# Patient Record
Sex: Male | Born: 1935
Health system: Southern US, Community
[De-identification: ages and names within clinical notes are randomized; demographics above are authoritative.]

## PROBLEM LIST (undated history)

## (undated) DIAGNOSIS — M1990 Unspecified osteoarthritis, unspecified site: Secondary | ICD-10-CM

## (undated) DIAGNOSIS — L738 Other specified follicular disorders: Secondary | ICD-10-CM

## (undated) DIAGNOSIS — N259 Disorder resulting from impaired renal tubular function, unspecified: Secondary | ICD-10-CM

## (undated) DIAGNOSIS — G4700 Insomnia, unspecified: Secondary | ICD-10-CM

## (undated) DIAGNOSIS — I498 Other specified cardiac arrhythmias: Secondary | ICD-10-CM

## (undated) DIAGNOSIS — H9190 Unspecified hearing loss, unspecified ear: Secondary | ICD-10-CM

## (undated) DIAGNOSIS — K4020 Bilateral inguinal hernia, without obstruction or gangrene, not specified as recurrent: Secondary | ICD-10-CM

## (undated) DIAGNOSIS — M1710 Unilateral primary osteoarthritis, unspecified knee: Secondary | ICD-10-CM

## (undated) DIAGNOSIS — J309 Allergic rhinitis, unspecified: Secondary | ICD-10-CM

## (undated) DIAGNOSIS — I10 Essential (primary) hypertension: Secondary | ICD-10-CM

## (undated) DIAGNOSIS — R609 Edema, unspecified: Secondary | ICD-10-CM

## (undated) DIAGNOSIS — R54 Age-related physical debility: Secondary | ICD-10-CM

## (undated) DIAGNOSIS — Z9289 Personal history of other medical treatment: Secondary | ICD-10-CM

## (undated) DIAGNOSIS — N401 Enlarged prostate with lower urinary tract symptoms: Secondary | ICD-10-CM

## (undated) DIAGNOSIS — J449 Chronic obstructive pulmonary disease, unspecified: Secondary | ICD-10-CM

## (undated) DIAGNOSIS — I4891 Unspecified atrial fibrillation: Secondary | ICD-10-CM

## (undated) DIAGNOSIS — N138 Other obstructive and reflux uropathy: Secondary | ICD-10-CM

## (undated) DIAGNOSIS — M179 Osteoarthritis of knee, unspecified: Secondary | ICD-10-CM

## (undated) HISTORY — PX: APPENDECTOMY: SHX54

## (undated) HISTORY — PX: EYE SURGERY: SHX253

## (undated) HISTORY — DX: Unilateral primary osteoarthritis, unspecified knee: M17.10

## (undated) HISTORY — DX: Benign prostatic hyperplasia with lower urinary tract symptoms: N40.1

## (undated) HISTORY — DX: Unspecified atrial fibrillation: I48.91

## (undated) HISTORY — DX: Essential (primary) hypertension: I10

## (undated) HISTORY — PX: KNEE SURGERY: SHX244

## (undated) HISTORY — DX: Other specified cardiac arrhythmias: I49.8

## (undated) HISTORY — DX: Personal history of other medical treatment: Z92.89

## (undated) HISTORY — PX: OTHER SURGICAL HISTORY: SHX169

## (undated) HISTORY — DX: Edema, unspecified: R60.9

## (undated) HISTORY — DX: Age-related physical debility: R54

## (undated) HISTORY — DX: Disorder resulting from impaired renal tubular function, unspecified: N25.9

## (undated) HISTORY — DX: Insomnia, unspecified: G47.00

## (undated) HISTORY — DX: Unspecified hearing loss, unspecified ear: H91.90

## (undated) HISTORY — DX: Chronic obstructive pulmonary disease, unspecified: J44.9

## (undated) HISTORY — DX: Allergic rhinitis, unspecified: J30.9

## (undated) HISTORY — DX: Other specified follicular disorders: L73.8

## (undated) HISTORY — DX: Other obstructive and reflux uropathy: N13.8

## (undated) HISTORY — DX: Bilateral inguinal hernia, without obstruction or gangrene, not specified as recurrent: K40.20

## (undated) HISTORY — DX: Osteoarthritis of knee, unspecified: M17.9

## (undated) HISTORY — PX: TONSILLECTOMY: SHX5217

---

## 2007-10-14 ENCOUNTER — Encounter: Payer: Self-pay | Admitting: Internal Medicine

## 2007-10-26 ENCOUNTER — Ambulatory Visit: Payer: Self-pay | Admitting: Internal Medicine

## 2007-10-26 ENCOUNTER — Telehealth (INDEPENDENT_AMBULATORY_CARE_PROVIDER_SITE_OTHER): Payer: Self-pay | Admitting: *Deleted

## 2007-10-26 LAB — CONVERTED CEMR LAB
INR: 2.4 — ABNORMAL HIGH (ref 0.8–1.0)
Prothrombin Time: 25.9 s — ABNORMAL HIGH (ref 10.9–13.3)

## 2007-10-27 ENCOUNTER — Telehealth: Payer: Self-pay | Admitting: Internal Medicine

## 2007-10-31 ENCOUNTER — Telehealth (INDEPENDENT_AMBULATORY_CARE_PROVIDER_SITE_OTHER): Payer: Self-pay | Admitting: *Deleted

## 2007-11-01 ENCOUNTER — Telehealth: Payer: Self-pay | Admitting: Internal Medicine

## 2007-11-04 ENCOUNTER — Ambulatory Visit: Payer: Self-pay | Admitting: Internal Medicine

## 2007-11-07 ENCOUNTER — Ambulatory Visit: Payer: Self-pay | Admitting: Cardiology

## 2007-11-09 ENCOUNTER — Telehealth: Payer: Self-pay | Admitting: Internal Medicine

## 2007-11-10 ENCOUNTER — Ambulatory Visit: Payer: Self-pay

## 2007-11-10 ENCOUNTER — Encounter: Payer: Self-pay | Admitting: Internal Medicine

## 2007-11-10 ENCOUNTER — Ambulatory Visit: Payer: Self-pay | Admitting: Internal Medicine

## 2007-11-10 LAB — CONVERTED CEMR LAB: TSH: 0.87 microintl units/mL (ref 0.35–5.50)

## 2007-11-17 ENCOUNTER — Ambulatory Visit: Payer: Self-pay | Admitting: Cardiology

## 2007-11-21 ENCOUNTER — Ambulatory Visit: Payer: Self-pay | Admitting: Internal Medicine

## 2007-11-24 ENCOUNTER — Ambulatory Visit: Payer: Self-pay | Admitting: Internal Medicine

## 2007-12-05 ENCOUNTER — Ambulatory Visit: Payer: Self-pay | Admitting: Cardiology

## 2007-12-22 ENCOUNTER — Ambulatory Visit: Payer: Self-pay | Admitting: Internal Medicine

## 2008-01-02 ENCOUNTER — Encounter: Payer: Self-pay | Admitting: Internal Medicine

## 2008-01-02 ENCOUNTER — Ambulatory Visit: Payer: Self-pay | Admitting: Internal Medicine

## 2008-01-02 ENCOUNTER — Telehealth (INDEPENDENT_AMBULATORY_CARE_PROVIDER_SITE_OTHER): Payer: Self-pay | Admitting: *Deleted

## 2008-01-10 ENCOUNTER — Ambulatory Visit: Payer: Self-pay | Admitting: Cardiovascular Disease

## 2008-01-18 ENCOUNTER — Telehealth: Payer: Self-pay | Admitting: Internal Medicine

## 2008-01-18 ENCOUNTER — Ambulatory Visit: Payer: Self-pay | Admitting: Internal Medicine

## 2008-01-23 ENCOUNTER — Telehealth: Payer: Self-pay | Admitting: Internal Medicine

## 2008-01-31 ENCOUNTER — Ambulatory Visit: Payer: Self-pay | Admitting: Internal Medicine

## 2008-02-08 ENCOUNTER — Ambulatory Visit: Payer: Self-pay | Admitting: Cardiology

## 2008-02-21 ENCOUNTER — Ambulatory Visit: Payer: Self-pay | Admitting: Cardiology

## 2008-03-20 ENCOUNTER — Ambulatory Visit: Payer: Self-pay | Admitting: Cardiology

## 2008-03-29 ENCOUNTER — Ambulatory Visit: Payer: Self-pay | Admitting: Internal Medicine

## 2008-04-17 ENCOUNTER — Ambulatory Visit: Payer: Self-pay | Admitting: Cardiology

## 2008-04-18 ENCOUNTER — Encounter: Admission: AD | Admit: 2008-04-18 | Discharge: 2008-04-18 | Payer: Self-pay | Admitting: Dentistry

## 2008-04-18 ENCOUNTER — Ambulatory Visit: Payer: Self-pay | Admitting: Dentistry

## 2008-04-24 ENCOUNTER — Telehealth: Payer: Self-pay | Admitting: Internal Medicine

## 2008-04-30 ENCOUNTER — Ambulatory Visit: Payer: Self-pay | Admitting: Internal Medicine

## 2008-05-01 ENCOUNTER — Ambulatory Visit: Payer: Self-pay | Admitting: Dentistry

## 2008-05-01 ENCOUNTER — Ambulatory Visit (HOSPITAL_COMMUNITY): Admission: RE | Admit: 2008-05-01 | Discharge: 2008-05-01 | Payer: Self-pay | Admitting: Dentistry

## 2008-05-05 ENCOUNTER — Ambulatory Visit: Payer: Self-pay | Admitting: Internal Medicine

## 2008-05-15 ENCOUNTER — Ambulatory Visit: Payer: Self-pay | Admitting: Cardiovascular Disease

## 2008-05-29 ENCOUNTER — Ambulatory Visit: Payer: Self-pay | Admitting: Internal Medicine

## 2008-06-26 ENCOUNTER — Ambulatory Visit: Payer: Self-pay | Admitting: Cardiology

## 2008-07-05 ENCOUNTER — Ambulatory Visit: Payer: Self-pay | Admitting: Dentistry

## 2008-07-10 ENCOUNTER — Ambulatory Visit: Payer: Self-pay | Admitting: Internal Medicine

## 2008-07-23 DIAGNOSIS — I10 Essential (primary) hypertension: Secondary | ICD-10-CM

## 2008-07-26 ENCOUNTER — Ambulatory Visit: Payer: Self-pay | Admitting: Cardiology

## 2008-08-03 ENCOUNTER — Telehealth: Payer: Self-pay | Admitting: Internal Medicine

## 2008-08-07 ENCOUNTER — Ambulatory Visit: Payer: Self-pay | Admitting: Cardiology

## 2008-08-22 ENCOUNTER — Ambulatory Visit: Payer: Self-pay | Admitting: Internal Medicine

## 2008-09-04 ENCOUNTER — Ambulatory Visit: Payer: Self-pay | Admitting: Cardiology

## 2008-09-10 ENCOUNTER — Ambulatory Visit: Payer: Self-pay | Admitting: Dentistry

## 2008-10-02 ENCOUNTER — Ambulatory Visit: Payer: Self-pay | Admitting: Internal Medicine

## 2008-10-24 ENCOUNTER — Ambulatory Visit: Payer: Self-pay | Admitting: Cardiology

## 2008-11-13 ENCOUNTER — Ambulatory Visit: Payer: Self-pay | Admitting: Cardiology

## 2008-12-05 ENCOUNTER — Ambulatory Visit: Payer: Self-pay | Admitting: Cardiology

## 2008-12-10 ENCOUNTER — Emergency Department (HOSPITAL_COMMUNITY): Admission: EM | Admit: 2008-12-10 | Discharge: 2008-12-10 | Payer: Self-pay | Admitting: Emergency Medicine

## 2008-12-18 ENCOUNTER — Encounter: Payer: Self-pay | Admitting: *Deleted

## 2008-12-21 ENCOUNTER — Ambulatory Visit: Payer: Self-pay | Admitting: Cardiology

## 2008-12-21 LAB — CONVERTED CEMR LAB: POC INR: 1.7

## 2009-01-04 ENCOUNTER — Ambulatory Visit: Payer: Self-pay | Admitting: Internal Medicine

## 2009-01-04 LAB — CONVERTED CEMR LAB
POC INR: 2
Protime: 17.6

## 2009-01-23 ENCOUNTER — Encounter: Payer: Self-pay | Admitting: *Deleted

## 2009-01-24 ENCOUNTER — Encounter: Payer: Self-pay | Admitting: Internal Medicine

## 2009-01-25 ENCOUNTER — Encounter (INDEPENDENT_AMBULATORY_CARE_PROVIDER_SITE_OTHER): Payer: Self-pay | Admitting: Cardiology

## 2009-01-25 ENCOUNTER — Ambulatory Visit: Payer: Self-pay | Admitting: Cardiology

## 2009-01-25 LAB — CONVERTED CEMR LAB: Prothrombin Time: 19.9 s

## 2009-02-22 ENCOUNTER — Ambulatory Visit: Payer: Self-pay | Admitting: Cardiology

## 2009-02-27 ENCOUNTER — Encounter (INDEPENDENT_AMBULATORY_CARE_PROVIDER_SITE_OTHER): Payer: Self-pay | Admitting: *Deleted

## 2009-02-28 ENCOUNTER — Telehealth: Payer: Self-pay | Admitting: Cardiology

## 2009-03-01 ENCOUNTER — Ambulatory Visit: Payer: Self-pay | Admitting: Cardiology

## 2009-03-01 LAB — CONVERTED CEMR LAB
GFR calc non Af Amer: 43.94 mL/min (ref >60)
Glucose, Bld: 108 mg/dL — ABNORMAL HIGH (ref 70–99)

## 2009-03-21 ENCOUNTER — Ambulatory Visit: Payer: Self-pay | Admitting: Cardiology

## 2009-03-21 LAB — CONVERTED CEMR LAB: POC INR: 2.2

## 2009-03-27 ENCOUNTER — Ambulatory Visit: Payer: Self-pay | Admitting: Internal Medicine

## 2009-03-29 ENCOUNTER — Telehealth: Payer: Self-pay | Admitting: Internal Medicine

## 2009-04-01 ENCOUNTER — Ambulatory Visit: Payer: Self-pay | Admitting: Cardiology

## 2009-04-04 LAB — CONVERTED CEMR LAB
BUN: 24 mg/dL — ABNORMAL HIGH (ref 6–23)
Basophils Relative: 0.8 % (ref 0.0–3.0)
CO2: 26 meq/L (ref 19–32)
Chloride: 110 meq/L (ref 96–112)
Creatinine, Ser: 1.5 mg/dL (ref 0.4–1.5)
Eosinophils Relative: 2 % (ref 0.0–5.0)
Hemoglobin: 14.2 g/dL (ref 13.0–17.0)
Lymphocytes Relative: 26.1 % (ref 12.0–46.0)
Monocytes Relative: 8.3 % (ref 3.0–12.0)
Neutro Abs: 3.5 10*3/uL (ref 1.4–7.7)
Neutrophils Relative %: 62.8 % (ref 43.0–77.0)
Potassium: 4.4 meq/L (ref 3.5–5.1)
RBC: 4.63 M/uL (ref 4.22–5.81)
WBC: 5.6 10*3/uL (ref 4.5–10.5)

## 2009-04-11 ENCOUNTER — Telehealth: Payer: Self-pay | Admitting: Internal Medicine

## 2009-04-18 ENCOUNTER — Ambulatory Visit: Payer: Self-pay | Admitting: Internal Medicine

## 2009-04-24 ENCOUNTER — Telehealth: Payer: Self-pay | Admitting: Cardiology

## 2009-05-16 ENCOUNTER — Ambulatory Visit: Payer: Self-pay | Admitting: Cardiology

## 2009-06-12 ENCOUNTER — Ambulatory Visit: Payer: Self-pay | Admitting: Internal Medicine

## 2009-07-10 ENCOUNTER — Ambulatory Visit: Payer: Self-pay | Admitting: Internal Medicine

## 2009-07-10 LAB — CONVERTED CEMR LAB: POC INR: 2.8

## 2009-08-07 ENCOUNTER — Ambulatory Visit: Payer: Self-pay | Admitting: Cardiovascular Disease

## 2009-08-26 ENCOUNTER — Telehealth: Payer: Self-pay | Admitting: Internal Medicine

## 2009-08-28 ENCOUNTER — Encounter: Payer: Self-pay | Admitting: Internal Medicine

## 2009-08-30 ENCOUNTER — Ambulatory Visit: Payer: Self-pay | Admitting: Internal Medicine

## 2009-09-04 ENCOUNTER — Ambulatory Visit: Payer: Self-pay | Admitting: Internal Medicine

## 2009-09-10 ENCOUNTER — Encounter: Payer: Self-pay | Admitting: Internal Medicine

## 2009-10-02 ENCOUNTER — Ambulatory Visit: Payer: Self-pay | Admitting: Internal Medicine

## 2009-10-02 LAB — CONVERTED CEMR LAB: POC INR: 2.5

## 2009-10-28 ENCOUNTER — Telehealth: Payer: Self-pay | Admitting: Internal Medicine

## 2009-10-30 ENCOUNTER — Ambulatory Visit: Payer: Self-pay | Admitting: Internal Medicine

## 2009-11-27 ENCOUNTER — Ambulatory Visit: Payer: Self-pay | Admitting: Internal Medicine

## 2009-12-20 ENCOUNTER — Telehealth: Payer: Self-pay | Admitting: Nurse Practitioner

## 2009-12-25 ENCOUNTER — Ambulatory Visit: Payer: Self-pay | Admitting: Cardiology

## 2010-01-06 ENCOUNTER — Ambulatory Visit: Payer: Self-pay | Admitting: Cardiology

## 2010-01-07 ENCOUNTER — Ambulatory Visit: Payer: Self-pay | Admitting: Internal Medicine

## 2010-01-07 DIAGNOSIS — J309 Allergic rhinitis, unspecified: Secondary | ICD-10-CM

## 2010-01-07 HISTORY — DX: Allergic rhinitis, unspecified: J30.9

## 2010-01-08 LAB — CONVERTED CEMR LAB
Basophils Absolute: 0 10*3/uL (ref 0.0–0.1)
CO2: 29 meq/L (ref 19–32)
Calcium: 9.4 mg/dL (ref 8.4–10.5)
Eosinophils Absolute: 0.2 10*3/uL (ref 0.0–0.7)
Glucose, Bld: 111 mg/dL — ABNORMAL HIGH (ref 70–99)
HCT: 47.1 % (ref 39.0–52.0)
Hemoglobin: 15.8 g/dL (ref 13.0–17.0)
Lymphs Abs: 1.5 10*3/uL (ref 0.7–4.0)
MCHC: 33.6 g/dL (ref 30.0–36.0)
MCV: 90.4 fL (ref 78.0–100.0)
Monocytes Absolute: 0.5 10*3/uL (ref 0.1–1.0)
Neutro Abs: 3 10*3/uL (ref 1.4–7.7)
Potassium: 4.7 meq/L (ref 3.5–5.1)
RDW: 16.3 % — ABNORMAL HIGH (ref 11.5–14.6)
Sodium: 142 meq/L (ref 135–145)

## 2010-01-29 ENCOUNTER — Ambulatory Visit: Payer: Self-pay | Admitting: Internal Medicine

## 2010-02-26 ENCOUNTER — Ambulatory Visit: Payer: Self-pay | Admitting: Cardiology

## 2010-03-26 ENCOUNTER — Ambulatory Visit: Payer: Self-pay | Admitting: Cardiovascular Disease

## 2010-04-16 ENCOUNTER — Telehealth: Payer: Self-pay | Admitting: Internal Medicine

## 2010-04-17 ENCOUNTER — Ambulatory Visit: Payer: Self-pay | Admitting: Internal Medicine

## 2010-04-23 ENCOUNTER — Ambulatory Visit: Payer: Self-pay | Admitting: Cardiovascular Disease

## 2010-04-24 ENCOUNTER — Telehealth: Payer: Self-pay | Admitting: Internal Medicine

## 2010-05-21 ENCOUNTER — Ambulatory Visit: Payer: Self-pay | Admitting: Cardiology

## 2010-06-18 ENCOUNTER — Ambulatory Visit: Payer: Self-pay | Admitting: Internal Medicine

## 2010-06-30 ENCOUNTER — Encounter: Payer: Self-pay | Admitting: Cardiology

## 2010-06-30 ENCOUNTER — Ambulatory Visit: Payer: Self-pay | Admitting: Cardiology

## 2010-07-16 ENCOUNTER — Ambulatory Visit: Payer: Self-pay | Admitting: Cardiology

## 2010-08-13 ENCOUNTER — Ambulatory Visit: Admission: RE | Admit: 2010-08-13 | Discharge: 2010-08-13 | Payer: Self-pay | Source: Home / Self Care

## 2010-08-19 ENCOUNTER — Telehealth: Payer: Self-pay | Admitting: Cardiology

## 2010-08-19 NOTE — Assessment & Plan Note (Signed)
Summary: recertify for o2///jrc   Visit Type:  Follow-up Copy to:  Dr. Dereck Ligas Na Primary Provider/Referring Provider:  Norins  CC:  Pt last seen 4/09. Pt here fo roxygen recertification. Marland Kitchen  History of Present Illness: OV 08/30/2009. This is a 77 year old male. Last seen April 2009 when he moved from [place] to [place] to be with duaghter. Had admission in 12/27/05 in [place] for Acute resp failure due to aE-copd and pneumona per hx. After that has been new  2L o2 dependent. Was told he had COPD. At that time when I saw him in April 2009, he was at his new baseline of Class 2 dyspnea and using spiriva. We did PFTs at that time on 11/04/2007. This showed isolated low DLCO but was otherwise normal. CXR April 8. 2009 showed elevated rt hemidiaphragm but otherwise clear except for some atlectasis.  After that he was lost to followup. Now showing due to O2 recertification. States that in past 2 years, good health. Has baseline doe when he walks 3 football field lenghts only. USes o2 all the time. Not taking inhalers incluidng spiriva and advair.   Main new complaint is hoarseness of voice past 2-3 years. Thinks it is related to intubation in 27-Dec-2005. Insidious onset. Stable since onset. No clear cut aggravating or relieving factors. Associated with difficulty getting out sputum for past  3 months. No associated weight loss  Current Medications (verified): 1)  Atenolol 50 Mg  Tabs (Atenolol) .... Take 1 Tablet By Mouth Two Times A Day 2)  Cvs Omeprazole 20 Mg  Tbec (Omeprazole) .... Take One Capsule Once Daily 3)  Flomax 0.4 Mg  Cp24 (Tamsulosin Hcl) .... Take One Capsule By Mouth Once Daily 4)  Vitamin D3 1000 Unit  Tabs (Cholecalciferol) .Marland Kitchen.. 1 By Mouth Daily 5)  Warfarin Sodium 3 Mg Tabs (Warfarin Sodium) .... As Directed 6)  Temazepam 30 Mg Caps (Temazepam) .... As Needed 7)  Amlodipine Besylate 5 Mg Tabs (Amlodipine Besylate) .... Take One Tablet By Mouth Daily  Allergies (verified): No Known Drug  Allergies  Past History:  Family History: Last updated: 2007-12-19 Father -deceased @54 : heart disease/MI mother -deceased @97 : colon cancer Neg- prostate, DM Brother with fatal MI Sister with liver failure/cirrhosis  Social History: Last updated: 07/23/2008 [employer] - [employer] 4 yrs WW[employer], shrapnel injury married - 12/27/45  after 33 yrs divorced; remarried 1983-12-28 - 2 daughters - 2056/12/1055; 1 son '30 - died cyanide poisoning/suicide work: Engineer, petroleum - retired. Lives temporarily with daughter- moved up from [place], [place].    wife remains in [place] at this time - May '09 EOL: patient with daughter as witness clearly does not want CPR, Mechanical         ventilation or other heroic/futile measures. Remote history of tobacco abuse No ETOH  Risk Factors: Caffeine Use: 1 (2007-12-19) Exercise: no (12/19/07)  Risk Factors: Smoking Status: quit (10/26/2007)  Past Medical History: Reviewed history from 04/01/2009 and no changes required. COPD - O2 dependent since Dec 27, 2005 in [place], [place]. Never intubated. No PFTs till April 2009.   Atrial Fibrillation - 09/2007. Pending appt with Dr. Olga Millers April 2009 BPH -  Hx of Pneumonia 2005-12-27 and December 28, 2007 Urosepsis - 2009 March Hypertension - on atenelol since 2001/12/27 HOH  Hematuria - at hospital admit march 2009 in [place].  Insomnia Osteoarthritis  Past Surgical History: Reviewed history from 07/23/2008 and no changes required. Appendectomy @23  Tonsillectomy Prior back surgery  Family History: Reviewed history  from 11/21/2007 and no changes required. Father -deceased @54 : heart disease/MI mother -deceased @97 : colon cancer Neg- prostate, DM Brother with fatal MI Sister with liver failure/cirrhosis  Social History: Reviewed history from 07/23/2008 and no changes required. [employer] - [employer] 4 yrs WWII, shrapnel injury married - Dec 02, 2045  after 2031-12-03 yrs divorced; remarried Dec 03, 1983 - 2 daughters - 05/16/20565; 1 son '23 - died  cyanide poisoning/suicide work: Engineer, petroleum - retired. Lives temporarily with daughter- moved up from [place], [place].    wife remains in [place] at this time - May '09 EOL: patient with daughter as witness clearly does not want CPR, Mechanical         ventilation or other heroic/futile measures. Remote history of tobacco abuse No ETOH  Review of Systems       The patient complains of shortness of breath with activity.  The patient denies shortness of breath at rest, productive cough, non-productive cough, coughing up blood, chest pain, irregular heartbeats, acid heartburn, indigestion, loss of appetite, weight change, abdominal pain, difficulty swallowing, sore throat, tooth/dental problems, headaches, nasal congestion/difficulty breathing through nose, sneezing, itching, ear ache, anxiety, depression, hand/feet swelling, joint stiffness or pain, rash, change in color of mucus, and fever.    Vital Signs:  Patient profile:   77 year old male Height:      67 inches Weight:      189.13 pounds O2 Sat:      95 % on Room air Pulse rate:   55 / minute BP sitting:   122 / 80  (right arm) Cuff size:   regular  Vitals Entered By: Carron Curie CMA (August 30, 2009 2:08 PM)  O2 Flow:  Room air  Serial Vital Signs/Assessments:  Comments: Ambulatory Pulse Oximetry  Resting; HR__56___    02 Sat__92___  Lap1 (185 feet)   HR__101___   02 Sat__87___Pt placed on 4 liters oxygen and sat incrased to 95% Lap2 (185 feet)   HR_____   02 Sat_____    Lap3 (185 feet)   HR_____   02 Sat_____  ___Test Completed without Difficulty __x_Test Stopped due to:pt desaturated   By: Carron Curie CMA   CC: Pt last seen 4/09. Pt here fo roxygen recertification.  Comments Medications reviewed with patient Carron Curie CMA  August 30, 2009 2:08 PM Daytime phone number verified with patient.    Physical Exam  General:  well-developed well-nourished in no acute distress. Skin is warm and  dry. Head:  HEENT is normal. Eyes:  C&S clear Ears:  R cerumen impaction and L Cerumen impaction.   Nose:  Erythematous throat mucosa and intranasal erythema.  Mouth:  mild inflammation along lower molar areas visible stitch along lower right molars Neck:  supple with no bruits. No thyromegaly. Chest Wall:  NT Lungs:  clear to auscultation Heart:  regular rate and rhythm Abdomen:  soft and nontender. No masses palpated. Msk:  normal ROM, no joint tenderness, and no joint swelling.   Pulses:  R and L carotid,radial,femoral,dorsalis pedis and posterior tibial pulses are full and equal bilaterally Extremities:  no edema. Neurologic:  grossly intact. Skin:  Intact without suspicious lesions or rashes Cervical Nodes:  No lymphadenopathy noted Psych:  Cognition and judgment appear intact. Alert and cooperative with normal attention span and concentration. No apparent delusions, illusions, hallucinations   CXR  Procedure date:  08/22/2009  Findings:       Comparison: 10/26/2007    Findings: Stable elevation of the right hemidiaphragm.  Improved   atelectasis in the lingula.  Minimal bibasilar atelectasis   persists.  Heart is upper limits normal in size.  There is   tortuosity of the thoracic aorta.  No effusions.    IMPRESSION:   Improving lingular atelectasis.  Minimal persistent bibasilar   atelectasis.    Stable elevation of the right hemidiaphragm.   Comments:      independently reviewed and agree  Impression & Recommendations:  Problem # 1:  HOARSENESS (VWU-981.19) Assessment New This is probabl related to prior intubation but being a former smoker and wiht new complaints of sputum stuck in throat. Therefore, prudent to get ENT opinion. I have made a referral. He agrees Orders: ENT Referral (ENT) Est. Patient Level IV (14782)  Problem # 2:  C O P D (ICD-496) Assessment: Unchanged He was told in [place] in 2007 he had copd after hospitalizatiln. Certainly a  candidate for it due to smoking. However, PFTs here in [place] 2010 while on spiriva and advair were normal except for low DLCO. The low dlco could be related to some atelectasis related to right diaphragm elevation or COPD itself. Currently I am not sure. I have advised him to take albuterol 2 puff as needed. Will repat PFTs since he is off inhalers to dettermine if he truly has copd or not. Currently has class 2 dyspnea with walking desaturations. So, ok to use O2  Patient Instructions: 1)  Use oxygen with walking past 50 feet and at sleep 2)  USe albuterol 2 puff as needed when short of breath 3)  Have breathing test spirometry in 3 months 4)  Return to see me in 3 months 5)  Continue your other medicines 6)  I am sending you to ENT doctor for hoarse voice   Immunization History:  Influenza Immunization History:    Influenza:  fluvax 3+ (03/20/2009)

## 2010-08-19 NOTE — Progress Notes (Signed)
  Phone Note Refill Request Message from:  Fax from Pharmacy on October 28, 2009 8:43 AM  Refills Requested: Medication #1:  TEMAZEPAM 30 MG CAPS as needed   Last Refilled: 09/22/2009 Please advise refill  Initial call taken by: Ami Bullins CMA,  October 28, 2009 8:45 AM  Follow-up for Phone Call        ok for #30 with 5 refills Follow-up by: Jacques Navy MD,  October 29, 2009 5:23 AM    Prescriptions: TEMAZEPAM 30 MG CAPS (TEMAZEPAM) as needed  #30 x 5   Entered by:   Ami Bullins CMA   Authorized by:   Jacques Navy MD   Signed by:   Bill Salinas CMA on 10/29/2009   Method used:   Telephoned to ...       [facility]. [phone]* (retail)       [phone]       [place], [place]  [address]       Ph: [phone]       Fax: [phone]   RxID:   [id number]

## 2010-08-19 NOTE — Medication Information (Signed)
Summary: rov/eac   Anticoagulant Therapy  Managed by: Bethena Midget, RN, BSN Referring MD: Olga Millers MD PCP: Link Snuffer MD: Clifton James MD, Cristal Deer Indication 1: Atrial Fibrillation (ICD-427.31) Lab Used: [facility] Site: [facility] INR POC 3.1 INR RANGE 2 - 3  Dietary changes: no    Health status changes: no    Bleeding/hemorrhagic complications: no    Recent/future hospitalizations: no    Any changes in medication regimen? no    Recent/future dental: no  Any missed doses?: no       Is patient compliant with meds? yes      Comments: Received flu vaccine  Allergies: No Known Drug Allergies  Anticoagulation Management History:      The patient is taking warfarin and comes in today for a routine follow up visit.  Positive risk factors for bleeding include an age of 35 years or older and presence of serious comorbidities.  Negative risk factors for bleeding include no history of CVA/TIA.  The bleeding index is 'intermediate risk'.  Positive CHADS2 values include History of HTN and Age > 41 years old.  Negative CHADS2 values include History of Diabetes and Prior Stroke/CVA/TIA.  The start date was 11/04/2007.  His last INR was 2.4 RATIO.  Anticoagulation responsible provider: Clifton James MD, Cristal Deer.  INR POC: 3.1.  Cuvette Lot#: [id number].  Exp: 05/2011.    Anticoagulation Management Assessment/Plan:      The patient's current anticoagulation dose is Warfarin sodium 3 mg tabs: as directed.  The target INR is 2.0-3.0.  The next INR is due 05/21/2010.  Anticoagulation instructions were given to patient.  Results were reviewed/authorized by Bethena Midget, RN, BSN.  He was notified by Bethena Midget, RN, BSN.         Prior Anticoagulation Instructions: INR 2.9  Continue taking 1/2 tablet on Thursday and 1 tablet all other days.  Return to clinic in 4 weeks.      Current Anticoagulation Instructions: INR 3.1 Today only take 1/2 pill then resume 1 pill everyday  except 1/2 pill on Thursdays. Recheck in 4 weeks.

## 2010-08-19 NOTE — Medication Information (Signed)
Summary: rov/tm   Anticoagulant Therapy  Managed by: Reina Fuse, PharmD Referring MD: Olga Millers MD PCP: Link Snuffer MD: Gala Romney MD, Reuel Boom Indication 1: Atrial Fibrillation (ICD-427.31) Lab Used: [facility] Site: [facility] INR POC 3.3 INR RANGE 2 - 3  Dietary changes: yes       Details: Has eaten a little less greens than usual.   Health status changes: no    Bleeding/hemorrhagic complications: no    Recent/future hospitalizations: no    Any changes in medication regimen? no    Recent/future dental: no  Any missed doses?: no       Is patient compliant with meds? yes       Allergies: No Known Drug Allergies  Anticoagulation Management History:      The patient is taking warfarin and comes in today for a routine follow up visit.  Positive risk factors for bleeding include an age of 75 years or older and presence of serious comorbidities.  Negative risk factors for bleeding include no history of CVA/TIA.  The bleeding index is 'intermediate risk'.  Positive CHADS2 values include History of HTN and Age > 65 years old.  Negative CHADS2 values include History of Diabetes and Prior Stroke/CVA/TIA.  The start date was 11/04/2007.  His last INR was 2.4 RATIO.  Anticoagulation responsible provider: Bensimhon MD, Reuel Boom.  INR POC: 3.3.  Cuvette Lot#: [id number].  Exp: 05/2011.    Anticoagulation Management Assessment/Plan:      The patient's current anticoagulation dose is Warfarin sodium 3 mg tabs: as directed.  The target INR is 2.0-3.0.  The next INR is due 07/16/2010.  Anticoagulation instructions were given to patient.  Results were reviewed/authorized by Reina Fuse, PharmD.  He was notified by Reina Fuse PharmD.         Prior Anticoagulation Instructions: INR 2.7 Continue 3mg  daily except 1.5mg s on Thursdays. Recheck in 4 weeks.   Current Anticoagulation Instructions: INR 3.3  Today, Wednesday, November 30th, do not take Coumadin. Then, resume taking  Coumadin 1 tab (3 mg) on all days except for Coumadin 0.5 tab (1.5 m) on Thursdays.  Return to clinic in 4 weeks.

## 2010-08-19 NOTE — Progress Notes (Signed)
Summary: Cardiology Phone Note - Edema   Phone Note Call from Patient   Caller: Daughter Summary of Call: received call from pts dtr that pt has been experiencing lower ext. edema.  edema is not present first thing in the morning but comes on over the course of the day.  pt. is sedentary and spends much of his day with his legs in a dependent position.  he has been told to wear TEDS in the past but doesn't.  he has no sob, pnd, orthopnea, change in wt., or change in abd. girth. I advised that as he is asymptomatic he does not need evaluation tonight.  However, he should keep his legs elevated whenever he is not up and walking and he should also wear his TED hose as prescribed.  his dtr verbalized understanding and was grateful for the call back. Initial call taken by: Creig Hines, ANP-BC,  December 20, 2009 6:42 PM

## 2010-08-19 NOTE — Medication Information (Signed)
Summary: rov/tm  Anticoagulant Therapy  Managed by: Weston Brass, PharmD Referring MD: Olga Millers MD PCP: Link Snuffer MD: Johney Frame MD, Fayrene Fearing Indication 1: Atrial Fibrillation (ICD-427.31) Lab Used: [facility]  Site: [facility] INR POC 1.9 INR RANGE 2 - 3  Dietary changes: no    Health status changes: no    Bleeding/hemorrhagic complications: no    Recent/future hospitalizations: no    Any changes in medication regimen? no    Recent/future dental: no  Any missed doses?: no       Is patient compliant with meds? yes       Allergies: No Known Drug Allergies  Anticoagulation Management History:      The patient is taking warfarin and comes in today for a routine follow up visit.  Positive risk factors for bleeding include an age of 75 years or older and presence of serious comorbidities.  Negative risk factors for bleeding include no history of CVA/TIA.  The bleeding index is 'intermediate risk'.  Positive CHADS2 values include History of HTN and Age > 26 years old.  Negative CHADS2 values include History of Diabetes and Prior Stroke/CVA/TIA.  The start date was 11/04/2007.  His last INR was 2.4 RATIO.  Anticoagulation responsible provider: Edyth Glomb MD, Fayrene Fearing.  INR POC: 1.9.  Cuvette Lot#: [id number].  Exp: 11/2010.    Anticoagulation Management Assessment/Plan:      The patient's current anticoagulation dose is Warfarin sodium 3 mg tabs: as directed.  The target INR is 2.0-3.0.  The next INR is due 11/27/2009.  Anticoagulation instructions were given to patient.  Results were reviewed/authorized by Weston Brass, PharmD.  He was notified by Weston Brass PharmD.         Prior Anticoagulation Instructions: INR 2.5 Continue 3mg s daily except 1.5mg s on Thursdays. REcheck in 4 weeks.   Current Anticoagulation Instructions: INR 1.9  Take 1 1/2 tablets today then resume same dose of 1 tablet every day except 1/2 tablet on Thursday

## 2010-08-19 NOTE — Letter (Signed)
Summary: [facility]  [facility]   Imported By: Sherian Rein 10/24/2009 11:01:13  _____________________________________________________________________  External Attachment:    Type:   Image     Comment:   External Document

## 2010-08-19 NOTE — Progress Notes (Signed)
Summary: REFILL TEMAZEPAM  Phone Note Refill Request Message from:  Fax from Pharmacy  Refills Requested: Medication #1:  TEMAZEPAM 30 MG CAPS as needed   Notes: 1 qhs prn [facility]  Initial call taken by: Lamar Sprinkles, CMA,  April 24, 2010 6:14 PM  Follow-up for Phone Call        ok x 5 Follow-up by: Jacques Navy MD,  April 24, 2010 6:58 PM    Prescriptions: TEMAZEPAM 30 MG CAPS (TEMAZEPAM) as needed  #30 x 5   Entered by:   Ami Bullins CMA   Authorized by:   Jacques Navy MD   Signed by:   Bill Salinas CMA on 04/25/2010   Method used:   Telephoned to ...       [facility]. [phone]* (retail)       [phone]       [place], [place]  [address]       Ph: [phone]       Fax: [phone]   RxID:   [id number]

## 2010-08-19 NOTE — Letter (Signed)
Summary: [facility]  [facility]   Imported By: Lester Kleberg 09/20/2009 11:02:08  _____________________________________________________________________  External Attachment:    Type:   Image     Comment:   External Document

## 2010-08-19 NOTE — Progress Notes (Signed)
Summary: fu  Phone Note Outgoing Call   Summary of Call: Ricardo Perkins, I got o2 recertiifcation letter but have not see him since april 2009. pls give him appt Initial call taken by: Kalman Shan MD,  August 26, 2009 5:47 PM  Follow-up for Phone Call        pt scheduled for appt on 08/30/08 at 2:30.Carron Curie CMA  August 27, 2009 2:49 PM

## 2010-08-19 NOTE — Medication Information (Signed)
Summary: rov/ewj  Anticoagulant Therapy  Managed by: Ricardo Reams, RN, BSN Referring MD: Ricardo Millers MD PCP: Ricardo Snuffer MD: Ricardo Frame MD, Ricardo Perkins Indication 1: Atrial Fibrillation (ICD-427.31) Lab Used: [facility] Site: [facility] INR POC 2.6 INR RANGE 2 - 3  Dietary changes: yes       Details: Trying to eat more vetetables, d/t incr weight.    Health status changes: no    Bleeding/hemorrhagic complications: no    Recent/future hospitalizations: no    Any changes in medication regimen? no    Recent/future dental: no  Any missed doses?: no       Is patient compliant with meds? yes       Allergies (verified): No Known Drug Allergies  Anticoagulation Management History:      The patient is taking warfarin and comes in today for a routine follow up visit.  Positive risk factors for bleeding include an age of 29 years or older and presence of serious comorbidities.  Negative risk factors for bleeding include no history of CVA/TIA.  The bleeding index is 'intermediate risk'.  Positive CHADS2 values include History of HTN and Age > 33 years old.  Negative CHADS2 values include History of Diabetes and Prior Stroke/CVA/TIA.  The start date was 11/04/2007.  His last INR was 2.4 RATIO.  Anticoagulation responsible provider: Beyonca Wisz MD, Ricardo Perkins.  INR POC: 2.6.  Cuvette Lot#: [id number].  Exp: 10/2010.    Anticoagulation Management Assessment/Plan:      The patient's current anticoagulation dose is Warfarin sodium 3 mg tabs: as directed.  The target INR is 2.0-3.0.  The next INR is due 10/02/2009.  Anticoagulation instructions were given to patient.  Results were reviewed/authorized by Ricardo Reams, RN, BSN.  He was notified by Ricardo Reams RN.         Prior Anticoagulation Instructions: INR 2.3  Continue on same dosage 3mg  daily except 1.5mg  on Thursdays.  Recheck in 4 weeks.    Current Anticoagulation Instructions: INR 2.6  Continue on same dosage 1 tablet daily except 1/2  tablet on Thursdays.  Recheck in 4 weeks.

## 2010-08-19 NOTE — Medication Information (Signed)
Summary: rov/sp   Anticoagulant Therapy  Managed by: Weston Brass, PharmD Referring MD: Olga Millers MD PCP: Link Snuffer MD: [name] MD, Arlys John Indication 1: Atrial Fibrillation (ICD-427.31) Lab Used: [facility] Site: [facility] INR POC 2.4 INR RANGE 2 - 3  Dietary changes: yes       Details: Less green vegetables the past few weeks.  Health status changes: no    Bleeding/hemorrhagic complications: no    Recent/future hospitalizations: no    Any changes in medication regimen? no    Recent/future dental: no  Any missed doses?: no       Is patient compliant with meds? yes       Allergies: No Known Drug Allergies  Anticoagulation Management History:      The patient is taking warfarin and comes in today for a routine follow up visit.  Positive risk factors for bleeding include an age of 3 years or older and presence of serious comorbidities.  Negative risk factors for bleeding include no history of CVA/TIA.  The bleeding index is 'intermediate risk'.  Positive CHADS2 values include History of HTN and Age > 66 years old.  Negative CHADS2 values include History of Diabetes and Prior Stroke/CVA/TIA.  The start date was 11/04/2007.  His last INR was 2.4 RATIO.  Anticoagulation responsible Tamyah Cutbirth: [name] MD, Arlys John.  INR POC: 2.4.  Cuvette Lot#: [id number].  Exp: 04/2011.    Anticoagulation Management Assessment/Plan:      The patient's current anticoagulation dose is Warfarin sodium 3 mg tabs: as directed.  The target INR is 2.0-3.0.  The next INR is due 03/26/2010.  Anticoagulation instructions were given to patient.  Results were reviewed/authorized by Weston Brass, PharmD.  He was notified by Liana Gerold, PharmD Candidate.         Prior Anticoagulation Instructions: INR 2.0  Continue same dose of 1 tablet every day except 1/2 tablet on Thursday.   Current Anticoagulation Instructions: INR 2.4  Continue taking 1 tablet daily except 1/2 tablet on Thursdays.   Return to clinic in 4 weeks.

## 2010-08-19 NOTE — Medication Information (Signed)
Summary: rov/sp  Anticoagulant Therapy  Managed by: Eda Keys, PharmD Referring MD: Olga Millers MD PCP: Link Snuffer MD: Clifton James MD, Cristal Deer Indication 1: Atrial Fibrillation (ICD-427.31) Lab Used: [facility] Site: [facility] INR POC 2.9 INR RANGE 2 - 3  Dietary changes: yes       Details: less greens than normal...but not a significant change  Health status changes: no    Bleeding/hemorrhagic complications: no    Recent/future hospitalizations: no    Any changes in medication regimen? no    Recent/future dental: no  Any missed doses?: no       Is patient compliant with meds? yes       Allergies: No Known Drug Allergies  Anticoagulation Management History:      The patient is taking warfarin and comes in today for a routine follow up visit.  Positive risk factors for bleeding include an age of 75 years or older and presence of serious comorbidities.  Negative risk factors for bleeding include no history of CVA/TIA.  The bleeding index is 'intermediate risk'.  Positive CHADS2 values include History of HTN and Age > 53 years old.  Negative CHADS2 values include History of Diabetes and Prior Stroke/CVA/TIA.  The start date was 11/04/2007.  His last INR was 2.4 RATIO.  Anticoagulation responsible provider: Clifton James MD, Cristal Deer.  INR POC: 2.9.  Cuvette Lot#: [id number].  Exp: 04/2011.    Anticoagulation Management Assessment/Plan:      The patient's current anticoagulation dose is Warfarin sodium 3 mg tabs: as directed.  The target INR is 2.0-3.0.  The next INR is due 04/23/2010.  Anticoagulation instructions were given to patient.  Results were reviewed/authorized by Eda Keys, PharmD.  He was notified by Eda Keys.         Prior Anticoagulation Instructions: INR 2.4  Continue taking 1 tablet daily except 1/2 tablet on Thursdays.  Return to clinic in 4 weeks.  Current Anticoagulation Instructions: INR 2.9  Continue taking 1/2 tablet on  Thursday and 1 tablet all other days.  Return to clinic in 4 weeks.

## 2010-08-19 NOTE — Progress Notes (Signed)
Summary: PNEUMONIA VACCINE  Phone Note Call from Patient   Summary of Call: Patient is scheduled for pneumonia vaccine tomorrow. Per EMR patient recieved this vaccine on 10/26/2007 and then again on 03/29/2008. Does this patient need vaccine once every year or more frequent? Or every 5 years? please advise.  Initial call taken by: Lamar Sprinkles, CMA,  April 16, 2010 10:03 AM  Follow-up for Phone Call        Only needed it once and having had it twice he will never need it again.  Follow-up by: Jacques Navy MD,  April 16, 2010 12:54 PM  Additional Follow-up for Phone Call Additional follow up Details #1::        Pt informed Additional Follow-up by: Lamar Sprinkles, CMA,  April 16, 2010 4:18 PM

## 2010-08-19 NOTE — Medication Information (Signed)
Summary: rov/sp  Anticoagulant Therapy  Managed by: Weston Brass, PharmD Referring MD: Olga Millers MD PCP: Link Snuffer MD: Juanda Chance MD, Jodine Muchmore Indication 1: Atrial Fibrillation (ICD-427.31) Lab Used: [facility] Site: [facility] INR POC 2.1 INR RANGE 2 - 3  Dietary changes: no    Health status changes: no    Bleeding/hemorrhagic complications: no    Recent/future hospitalizations: no    Any changes in medication regimen? no    Recent/future dental: no  Any missed doses?: no       Is patient compliant with meds? yes       Allergies: No Known Drug Allergies  Anticoagulation Management History:      The patient is taking warfarin and comes in today for a routine follow up visit.  Positive risk factors for bleeding include an age of 75 years or older and presence of serious comorbidities.  Negative risk factors for bleeding include no history of CVA/TIA.  The bleeding index is 'intermediate risk'.  Positive CHADS2 values include History of HTN and Age > 75 years old.  Negative CHADS2 values include History of Diabetes and Prior Stroke/CVA/TIA.  The start date was 11/04/2007.  His last INR was 2.4 RATIO.  Anticoagulation responsible provider: Juanda Chance MD, Smitty Cords.  INR POC: 2.1.  Cuvette Lot#: [id number].  Exp: 02/2011.    Anticoagulation Management Assessment/Plan:      The patient's current anticoagulation dose is Warfarin sodium 3 mg tabs: as directed.  The target INR is 2.0-3.0.  The next INR is due 01/22/2010.  Anticoagulation instructions were given to patient.  Results were reviewed/authorized by Weston Brass, PharmD.  He was notified by Weston Brass PharmD.         Prior Anticoagulation Instructions: INR 2.5  Continue same dose of 1 tablet every day except 1/2 tablet on Thursday   Current Anticoagulation Instructions: INR 2.1  Continue same dose of 1 tablet every day except 1/2 tablet on Thursday.

## 2010-08-19 NOTE — Medication Information (Signed)
Summary: rov/sp  Anticoagulant Therapy  Managed by: Weston Brass, PharmD Referring MD: Olga Millers MD PCP: Link Snuffer MD: Johney Frame MD, Fayrene Fearing Indication 1: Atrial Fibrillation (ICD-427.31) Lab Used: [facility] Site: [facility] INR RANGE 2 - 3      Any changes in medication regimen? yes       Details: decreased atenolol to 1/2 tab bid       Allergies: No Known Drug Allergies  Anticoagulation Management History:      The patient is taking warfarin and comes in today for a routine follow up visit.  Positive risk factors for bleeding include an age of 75 years or older and presence of serious comorbidities.  Negative risk factors for bleeding include no history of CVA/TIA.  The bleeding index is 'intermediate risk'.  Positive CHADS2 values include History of HTN and Age > 50 years old.  Negative CHADS2 values include History of Diabetes and Prior Stroke/CVA/TIA.  The start date was 11/04/2007.  His last INR was 2.4 RATIO.  Anticoagulation responsible provider: Eddye Broxterman MD, Fayrene Fearing.  Cuvette Lot#: [id number].  Exp: 03/2011.    Anticoagulation Management Assessment/Plan:      The patient's current anticoagulation dose is Warfarin sodium 3 mg tabs: as directed.  The target INR is 2.0-3.0.  The next INR is due 02/26/2010.  Anticoagulation instructions were given to patient.  Results were reviewed/authorized by Weston Brass, PharmD.  He was notified by Weston Brass PharmD.         Prior Anticoagulation Instructions: INR 2.1  Continue same dose of 1 tablet every day except 1/2 tablet on Thursday.   Current Anticoagulation Instructions: INR 2.0  Continue same dose of 1 tablet every day except 1/2 tablet on Thursday.

## 2010-08-19 NOTE — Assessment & Plan Note (Signed)
Summary: F11M/DM  Medications Added ATENOLOL 50 MG  TABS (ATENOLOL) Take 1/2  tablet by mouth two times a day        Referring Provider:  Dr. Dereck Ligas Na Primary Provider:  [facility]  CC:  check up.  History of Present Illness: [name] is a pleasant  gentleman, who has a history of paroxysmal atrial fibrillation.  His LV function is normal.  He has also had a previous Myoview on November 10, 2007, that was interpreted as a possible  small area of lateral ischemia at the apex, but I reviewed this, and  in fact, felt it was most likely normal.  His TSH has been normal. I last saw him in Sept 2010. Since then he has dyspnea with more extreme activities but not with routine activities. It is relieved with rest. It is not associated with chest pain. There is no orthopnea, PND, palpitations, syncope, chest pain or bleeding. He occasionally has mild pedal edema.   Current Medications (verified): 1)  Atenolol 50 Mg  Tabs (Atenolol) .... Take 1 Tablet By Mouth Two Times A Day 2)  Cvs Omeprazole 20 Mg  Tbec (Omeprazole) .... Take One Capsule Once Daily 3)  Flomax 0.4 Mg  Cp24 (Tamsulosin Hcl) .... Take One Capsule By Mouth Once Daily 4)  Vitamin D3 1000 Unit  Tabs (Cholecalciferol) .Marland Kitchen.. 1 By Mouth Daily 5)  Warfarin Sodium 3 Mg Tabs (Warfarin Sodium) .... As Directed 6)  Temazepam 30 Mg Caps (Temazepam) .... As Needed 7)  Amlodipine Besylate 5 Mg Tabs (Amlodipine Besylate) .... Take One Tablet By Mouth Daily  Allergies: No Known Drug Allergies  Past History:  Past Medical History: Reviewed history from 04/01/2009 and no changes required. COPD - O2 dependent since 12/20/2005 in [place], [place]. Never intubated. No PFTs till April 2009.   Atrial Fibrillation - 09/2007. Pending appt with Dr. Olga Millers April 2009 BPH -  Hx of Pneumonia 2005-12-20 and 12-21-2007 Urosepsis - 2009 March Hypertension - on atenelol since 12-20-01 HOH  Hematuria - at hospital admit march 2009 in [place].  Insomnia Osteoarthritis  Past  Surgical History: Reviewed history from 07/23/2008 and no changes required. Appendectomy @23  Tonsillectomy Prior back surgery  Social History: Reviewed history from 07/23/2008 and no changes required. 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 Lives temporarily with daughter- moved up from [place], [place].    wife remains in [place] at this time - May '09 EOL: patient with daughter as witness clearly does not want CPR, Mechanical         ventilation or other heroic/futile measures. Remote history of tobacco abuse No ETOH  Review of Systems       Mild arthralgias in his shoulders but no fevers or chills, productive cough, hemoptysis, dysphasia, odynophagia, melena, hematochezia, dysuria, hematuria, rash, seizure activity, orthopnea, PND, pedal edema, claudication. Remaining systems are negative.   Vital Signs:  Patient profile:   77 year old male Height:      67 inches Weight:      183 pounds BMI:     28.77 Pulse rate:   49 / minute Resp:     12 per minute BP sitting:   134 / 70  (left arm)  Vitals Entered By: Kem Parkinson (January 06, 2010 9:32 AM)  Physical Exam  General:  Well-developed  well-nourished in no acute distress.  Skin is warm and dry.  HEENT is normal.  Neck is supple. No thyromegaly.  Chest is clear to auscultation with normal expansion.  Cardiovascular exam is bradycardic.  Abdominal exam nontender or distended. No masses palpated. Extremities show no edema. neuro grossly intact    EKG  Procedure date:  01/06/2010  Findings:      Marked sinus bradycardia with first degree block. Prior anterior infarct. Prior inferior infarct. Left ventricular hypertrophy.  Impression & Recommendations:  Problem # 1:  RENAL INSUFFICIENCY (ICD-588.9)  Recheck to bmet.  Orders: TLB-BMP (Basic  Metabolic Panel-BMET) (80048-METABOL)  Problem # 2:  BRADYCARDIA (ICD-427.89) Decrease atenolol to 25 mg p.o. b.i.d. His updated medication list for this problem includes:    Atenolol 50 Mg Tabs (Atenolol) .Marland Kitchen... Take 1/2  tablet by mouth two times a day    Warfarin Sodium 3 Mg Tabs (Warfarin sodium) .Marland Kitchen... As directed    Amlodipine Besylate 5 Mg Tabs (Amlodipine besylate) .Marland Kitchen... Take one tablet by mouth daily  Problem # 3:  COUMADIN THERAPY (ICD-V58.61) Goal INR  2-3. Check CBC.  Problem # 4:  HYPERTENSION (ICD-401.9)  Blood pressure controlled. His updated medication list for this problem includes:    Atenolol 50 Mg Tabs (Atenolol) .Marland Kitchen... Take 1/2  tablet by mouth two times a day    Amlodipine Besylate 5 Mg Tabs (Amlodipine besylate) .Marland Kitchen... Take one tablet by mouth daily  His updated medication list for this problem includes:    Atenolol 50 Mg Tabs (Atenolol) .Marland Kitchen... Take 1 tablet by mouth two times a day    Amlodipine Besylate 5 Mg Tabs (Amlodipine besylate) .Marland Kitchen... Take one tablet by mouth daily  Problem # 5:  PAROXYSMAL ATRIAL FIBRILLATION (ICD-427.31)  Remains in sinus. Continue beta blocker and Coumadin. His updated medication list for this problem includes:    Atenolol 50 Mg Tabs (Atenolol) .Marland Kitchen... Take 1 tablet by mouth two times a day    Warfarin Sodium 3 Mg Tabs (Warfarin sodium) .Marland Kitchen... As directed  Orders: TLB-CBC Platelet - w/Differential (85025-CBCD)  Problem # 6:  C O P D (ICD-496)  Patient Instructions: 1)  Your physician recommends that you schedule a follow-up appointment in: 6 MONTHS 2)  Your physician has recommended you make the following change in your medication: CHANGE ATENOLOL TO 50MG  ONE HALF TABLET TWICE DAILY Prescriptions: ATENOLOL 50 MG  TABS (ATENOLOL) Take 1/2  tablet by mouth two times a day  #30 x 12   Entered by:   Deliah Goody, RN   Authorized by:   Ferman Hamming, MD, [facility])   Signed by:   Deliah Goody, RN on 01/06/2010   Method used:    Electronically to        [facility]. [phone]* (retail)       [phone]       [place], [place]  [address]       Ph: [phone]       Fax: [phone]   RxID:   [id number]

## 2010-08-19 NOTE — Medication Information (Signed)
Summary: rov/tm  Anticoagulant Therapy  Managed by: Cloyde Reams, RN, BSN Referring MD: Olga Millers MD PCP: Link Snuffer MD: Eden Emms MD, Theron Arista Indication 1: Atrial Fibrillation (ICD-427.31) Lab Used: [facility] Site: [facility] INR POC 2.3 INR RANGE 2 - 3  Dietary changes: no    Health status changes: no    Bleeding/hemorrhagic complications: no    Recent/future hospitalizations: no    Any changes in medication regimen? no    Recent/future dental: no  Any missed doses?: no       Is patient compliant with meds? yes       Allergies (verified): No Known Drug Allergies  Anticoagulation Management History:      The patient is taking warfarin and comes in today for a routine follow up visit.  Positive risk factors for bleeding include an age of 33 years or older and presence of serious comorbidities.  Negative risk factors for bleeding include no history of CVA/TIA.  The bleeding index is 'intermediate risk'.  Positive CHADS2 values include History of HTN and Age > 43 years old.  Negative CHADS2 values include History of Diabetes and Prior Stroke/CVA/TIA.  The start date was 11/04/2007.  His last INR was 2.4 RATIO.  Anticoagulation responsible provider: Eden Emms MD, Theron Arista.  INR POC: 2.3.  Cuvette Lot#: [id number].  Exp: 10/2010.    Anticoagulation Management Assessment/Plan:      The patient's current anticoagulation dose is Warfarin sodium 3 mg tabs: as directed.  The target INR is 2.0-3.0.  The next INR is due 09/04/2009.  Anticoagulation instructions were given to patient.  Results were reviewed/authorized by Cloyde Reams, RN, BSN.  He was notified by Cloyde Reams RN.         Prior Anticoagulation Instructions: INR 2.8 Continue 3mg s everyday except 1.5mg s on Thursdays. Recheck in 4 weeks.   Current Anticoagulation Instructions: INR 2.3  Continue on same dosage 3mg  daily except 1.5mg  on Thursdays.  Recheck in 4 weeks.

## 2010-08-19 NOTE — Medication Information (Signed)
Summary: rov/ewj  Anticoagulant Therapy  Managed by: Bethena Midget, RN, BSN Referring MD: Olga Millers MD PCP: Link Snuffer MD: Johney Frame MD, Fayrene Fearing Indication 1: Atrial Fibrillation (ICD-427.31) Lab Used: [facility] Site: [facility] INR POC 2.5 INR RANGE 2 - 3  Dietary changes: no    Health status changes: no    Bleeding/hemorrhagic complications: no    Recent/future hospitalizations: no    Any changes in medication regimen? no    Recent/future dental: no  Any missed doses?: no       Is patient compliant with meds? yes       Allergies: No Known Drug Allergies  Anticoagulation Management History:      The patient is taking warfarin and comes in today for a routine follow up visit.  Positive risk factors for bleeding include an age of 75 years or older and presence of serious comorbidities.  Negative risk factors for bleeding include no history of CVA/TIA.  The bleeding index is 'intermediate risk'.  Positive CHADS2 values include History of HTN and Age > 10 years old.  Negative CHADS2 values include History of Diabetes and Prior Stroke/CVA/TIA.  The start date was 11/04/2007.  His last INR was 2.4 RATIO.  Anticoagulation responsible provider: Kensington Duerst MD, Fayrene Fearing.  INR POC: 2.5.  Cuvette Lot#: [id number].  Exp: 11/2010.    Anticoagulation Management Assessment/Plan:      The patient's current anticoagulation dose is Warfarin sodium 3 mg tabs: as directed.  The target INR is 2.0-3.0.  The next INR is due 10/30/2009.  Anticoagulation instructions were given to patient.  Results were reviewed/authorized by Bethena Midget, RN, BSN.  He was notified by Bethena Midget, RN, BSN.         Prior Anticoagulation Instructions: INR 2.6  Continue on same dosage 1 tablet daily except 1/2 tablet on Thursdays.  Recheck in 4 weeks.    Current Anticoagulation Instructions: INR 2.5 Continue 3mg s daily except 1.5mg s on Thursdays. REcheck in 4 weeks.

## 2010-08-19 NOTE — Letter (Signed)
Summary: CMN for Oxygen/[facility]  CMN for Oxygen/[facility]   Imported By: Sherian Rein 09/02/2009 08:55:22  _____________________________________________________________________  External Attachment:    Type:   Image     Comment:   External Document

## 2010-08-19 NOTE — Medication Information (Signed)
Summary: rov/sp  Anticoagulant Therapy  Managed by: Weston Brass, PharmD Referring MD: Olga Millers MD PCP: Link Snuffer MD: Ladona Ridgel MD, Sharlot Gowda Indication 1: Atrial Fibrillation (ICD-427.31) Lab Used: [facility] Site: [facility] INR POC 2.5 INR RANGE 2 - 3  Dietary changes: no    Health status changes: no    Bleeding/hemorrhagic complications: no    Recent/future hospitalizations: no    Any changes in medication regimen? no    Recent/future dental: no  Any missed doses?: no       Is patient compliant with meds? yes       Allergies: No Known Drug Allergies  Anticoagulation Management History:      The patient is taking warfarin and comes in today for a routine follow up visit.  Positive risk factors for bleeding include an age of 75 years or older and presence of serious comorbidities.  Negative risk factors for bleeding include no history of CVA/TIA.  The bleeding index is 'intermediate risk'.  Positive CHADS2 values include History of HTN and Age > 8 years old.  Negative CHADS2 values include History of Diabetes and Prior Stroke/CVA/TIA.  The start date was 11/04/2007.  His last INR was 2.4 RATIO.  Anticoagulation responsible provider: Ladona Ridgel MD, Sharlot Gowda.  INR POC: 2.5.  Cuvette Lot#: [id number].  Exp: 02/2011.    Anticoagulation Management Assessment/Plan:      The patient's current anticoagulation dose is Warfarin sodium 3 mg tabs: as directed.  The target INR is 2.0-3.0.  The next INR is due 12/25/2009.  Anticoagulation instructions were given to patient.  Results were reviewed/authorized by Weston Brass, PharmD.  He was notified by Weston Brass PharmD.         Prior Anticoagulation Instructions: INR 1.9  Take 1 1/2 tablets today then resume same dose of 1 tablet every day except 1/2 tablet on Thursday   Current Anticoagulation Instructions: INR 2.5  Continue same dose of 1 tablet every day except 1/2 tablet on Thursday

## 2010-08-19 NOTE — Assessment & Plan Note (Signed)
Summary: MED REFILL / HAD LABS TODAY AT CARDIOLOGY/LOV W/ MEN IN 2009/[facility]   Vital Signs:  Patient profile:   77 year old male Height:      67 inches Weight:      183 pounds BMI:     28.77 O2 Sat:      96 % on 4 L/min Temp:     97.0 degrees F oral Pulse rate:   44 / minute BP sitting:   124 / 82  (left arm) Cuff size:   regular  Vitals Entered By: Bill Salinas CMA (January 07, 2010 4:29 PM)  O2 Flow:  4 L/min CC: pt here for med refills   Primary Care Provider:  Keil Pickering  CC:  pt here for med refills.  History of Present Illness: patient presents for medical follow-up. His chief complaint is rhinnorrhea when he eats. He also has swelling in his legs and spots. Otherwise he feels OK. He is on continuous oxygen and doesn't appear short of breath. Saw Dr. [name] yesterday and was doing OK. He had his atenolol reduced.   Current Medications (verified): 1)  Atenolol 50 Mg  Tabs (Atenolol) .... Take 1/2  Tablet By Mouth Two Times A Day 2)  Cvs Omeprazole 20 Mg  Tbec (Omeprazole) .... Take One Capsule Once Daily 3)  Flomax 0.4 Mg  Cp24 (Tamsulosin Hcl) .... Take One Capsule By Mouth Once Daily 4)  Vitamin D3 1000 Unit  Tabs (Cholecalciferol) .Marland Kitchen.. 1 By Mouth Daily 5)  Warfarin Sodium 3 Mg Tabs (Warfarin Sodium) .... As Directed 6)  Temazepam 30 Mg Caps (Temazepam) .... As Needed 7)  Amlodipine Besylate 5 Mg Tabs (Amlodipine Besylate) .... Take One Tablet By Mouth Daily  Allergies (verified): No Known Drug Allergies  Past History:  Past Medical History: Last updated: 04/01/2009 COPD - O2 dependent since 12/01/05 in [place], [place]. Never intubated. No PFTs till April 2009.   Atrial Fibrillation - 09/2007. Pending appt with Dr. Olga Millers April 2009 BPH -  Hx of Pneumonia 2005-12-01 and 02-Dec-2007 Urosepsis - 2009 March Hypertension - on atenelol since Dec 01, 2001 HOH  Hematuria - at hospital admit march 2009 in [place].  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 Lives temporarily with daughter- moved up from [place], [place].    wife remains in [place] at this time - May '09 EOL: patient with daughter as witness clearly does not want CPR, Mechanical         ventilation or other heroic/futile measures. Remote history of tobacco abuse No ETOH  Review of Systems  The patient denies anorexia, fever, chest pain, syncope, dyspnea on exertion, and abdominal pain.    Physical Exam  General:  Elderly white male on oxygen in no distress Head:  Normocephalic and atraumatic without obvious abnormalities. No apparent alopecia or balding. Eyes:  corneas and lenses clear and no injection.   Nose:  no external deformity and no external erythema.   Lungs:  normal respiratory effort, no intercostal retractions, no accessory muscle use, normal breath sounds, and no  wheezes.   Heart:  IRIR rate controlled Msk:  normal ROM, no joint swelling, no joint warmth, and no joint deformities.   Pulses:  2+ radial Extremities:  trace LE edema to below the knee Neurologic:  alert & oriented X3, cranial nerves II-XII intact, and gait normal.   Skin:  hemosiderin staining of the distal LE - no petechial lesions. No ulcerations Psych:  Oriented X3, normally interactive, and good eye contact.     Impression & Recommendations:  Problem # 1:  RHINITIS, VASOMOTOR (ICD-477.9) associated with eating. Not causing any discomfort.  Plan - reassurance  Problem # 2:  LEG  EDEMA, BILATERAL (ICD-782.3) mild fluid accumulation in a well compensated patient. No evidence of hear failure. Last lab June 20 reviewed - normal creatinine. He does have mild chronic skin changes - hemosiderin staining as a consequence.  Plan - knee high men's support hosiery  Complete Medication List: 1)  Atenolol 50 Mg Tabs (Atenolol) .... Take 1/2  tablet by mouth two times a day 2)  Cvs Omeprazole 20 Mg Tbec (Omeprazole) .... Take one capsule once daily 3)  Flomax 0.4 Mg Cp24 (Tamsulosin hcl) .... Take one capsule by mouth once daily 4)  Vitamin D3 1000 Unit Tabs (Cholecalciferol) .Marland Kitchen.. 1 by mouth daily 5)  Warfarin Sodium 3 Mg Tabs (Warfarin sodium) .... As directed 6)  Temazepam 30 Mg Caps (Temazepam) .... As needed 7)  Amlodipine Besylate 5 Mg Tabs (Amlodipine besylate) .... Take one tablet by mouth daily

## 2010-08-19 NOTE — Medication Information (Signed)
Summary: rov/tm  Anticoagulant Therapy  Managed by: Bethena Midget, RN, BSN Referring MD: Olga Millers MD PCP: Link Snuffer MD: Riley Kill MD, Maisie Fus Indication 1: Atrial Fibrillation (ICD-427.31) Lab Used: [facility] Site: [facility] INR POC 2.7 INR RANGE 2 - 3  Dietary changes: no    Health status changes: no    Bleeding/hemorrhagic complications: no    Recent/future hospitalizations: no    Any changes in medication regimen? no    Recent/future dental: no  Any missed doses?: no       Is patient compliant with meds? yes       Allergies: No Known Drug Allergies  Anticoagulation Management History:      The patient is taking warfarin and comes in today for a routine follow up visit.  Positive risk factors for bleeding include an age of 2 years or older and presence of serious comorbidities.  Negative risk factors for bleeding include no history of CVA/TIA.  The bleeding index is 'intermediate risk'.  Positive CHADS2 values include History of HTN and Age > 52 years old.  Negative CHADS2 values include History of Diabetes and Prior Stroke/CVA/TIA.  The start date was 11/04/2007.  His last INR was 2.4 RATIO.  Anticoagulation responsible provider: Riley Kill MD, Maisie Fus.  INR POC: 2.7.  Cuvette Lot#: [id number].  Exp: 05/2011.    Anticoagulation Management Assessment/Plan:      The patient's current anticoagulation dose is Warfarin sodium 3 mg tabs: as directed.  The target INR is 2.0-3.0.  The next INR is due 06/18/2010.  Anticoagulation instructions were given to patient.  Results were reviewed/authorized by Bethena Midget, RN, BSN.  He was notified by Bethena Midget, RN, BSN.         Prior Anticoagulation Instructions: INR 3.1 Today only take 1/2 pill then resume 1 pill everyday except 1/2 pill on Thursdays. Recheck in 4 weeks.   Current Anticoagulation Instructions: INR 2.7 Continue 3mg  daily except 1.5mg s on Thursdays. Recheck in 4 weeks.

## 2010-08-19 NOTE — Assessment & Plan Note (Signed)
Summary: FLU AND PNEUMONIA SHOT PER PT LAST PNEU SHOT/3 YRS AGO???--ME...   Nurse Visit   Vital Signs:  Patient profile:   77 year old male Temp:     96.5 degrees F oral  Vitals Entered By: Lanier Prude, CMA(AAMA) (April 17, 2010 2:16 PM)  Allergies: No Known Drug Allergies  Orders Added: 1)  Flu Vaccine 8yrs + MEDICARE PATIENTS [Q2039] 2)  Administration Flu vaccine - MCR [G0008] Flu Vaccine Consent Questions     Do you have a history of severe allergic reactions to this vaccine? no    Any prior history of allergic reactions to egg and/or gelatin? no    Do you have a sensitivity to the preservative Thimersol? no    Do you have a past history of Guillan-Barre Syndrome? no    Do you currently have an acute febrile illness? no    Have you ever had a severe reaction to latex? no    Vaccine information given and explained to patient? yes    Are you currently pregnant? no    Lot Number:[id number]   Exp Date:01/17/2011   Site Given  Left Deltoid IM Lanier Prude, [facility])  April 17, 2010 2:17 PM

## 2010-08-21 NOTE — Medication Information (Signed)
Summary: rov/tm   Anticoagulant Therapy  Managed by: Windell Hummingbird, RN Referring MD: Olga Millers MD PCP: Link Snuffer MD: Eden Emms MD, Theron Arista Indication 1: Atrial Fibrillation (ICD-427.31) Lab Used: [facility] Site: [facility] INR POC 2.7 INR RANGE 2 - 3  Dietary changes: no    Health status changes: no    Bleeding/hemorrhagic complications: no    Recent/future hospitalizations: no    Any changes in medication regimen? no    Recent/future dental: no  Any missed doses?: no       Is patient compliant with meds? yes       Allergies: No Known Drug Allergies  Anticoagulation Management History:      The patient is taking warfarin and comes in today for a routine follow up visit.  Positive risk factors for bleeding include an age of 75 years or older and presence of serious comorbidities.  Negative risk factors for bleeding include no history of CVA/TIA.  The bleeding index is 'intermediate risk'.  Positive CHADS2 values include History of HTN and Age > 74 years old.  Negative CHADS2 values include History of Diabetes and Prior Stroke/CVA/TIA.  The start date was 11/04/2007.  His last INR was 2.4 RATIO.  Anticoagulation responsible provider: Eden Emms MD, Theron Arista.  INR POC: 2.7.  Cuvette Lot#: [id number].  Exp: 07/2011.    Anticoagulation Management Assessment/Plan:      The patient's current anticoagulation dose is Warfarin sodium 3 mg tabs: as directed.  The target INR is 2.0-3.0.  The next INR is due 09/10/2010.  Anticoagulation instructions were given to patient.  Results were reviewed/authorized by Windell Hummingbird, RN.  He was notified by Windell Hummingbird, RN.         Prior Anticoagulation Instructions: INR 2.7 Continue 1 pill everyday except 1/2 pill on Thursdays. Recheck in 4 weeks.   Current Anticoagulation Instructions: INR 2.7 Continue to take 1 tablet every day except Thursday take 1/2 tablet. Recheck in 4 weeks.

## 2010-08-21 NOTE — Medication Information (Signed)
Summary: rov/sl  Anticoagulant Therapy  Managed by: Bethena Midget, RN, BSN Referring MD: Olga Millers MD PCP: Link Snuffer MD: Riley Kill MD, Maisie Fus Indication 1: Atrial Fibrillation (ICD-427.31) Lab Used: [facility] Site: [facility] INR POC 2.7 INR RANGE 2 - 3  Dietary changes: no    Health status changes: no    Bleeding/hemorrhagic complications: no    Recent/future hospitalizations: no    Any changes in medication regimen? no    Recent/future dental: no  Any missed doses?: no       Is patient compliant with meds? yes       Allergies: No Known Drug Allergies  Anticoagulation Management History:      The patient is taking warfarin and comes in today for a routine follow up visit.  Positive risk factors for bleeding include an age of 75 years or older and presence of serious comorbidities.  Negative risk factors for bleeding include no history of CVA/TIA.  The bleeding index is 'intermediate risk'.  Positive CHADS2 values include History of HTN and Age > 38 years old.  Negative CHADS2 values include History of Diabetes and Prior Stroke/CVA/TIA.  The start date was 11/04/2007.  His last INR was 2.4 RATIO.  Anticoagulation responsible Jacqlyn Marolf: Riley Kill MD, Maisie Fus.  INR POC: 2.7.  Cuvette Lot#: [id number].  Exp: 08/2011.    Anticoagulation Management Assessment/Plan:      The patient's current anticoagulation dose is Warfarin sodium 3 mg tabs: as directed.  The target INR is 2.0-3.0.  The next INR is due 08/13/2010.  Anticoagulation instructions were given to patient.  Results were reviewed/authorized by Bethena Midget, RN, BSN.  He was notified by Bethena Midget, RN, BSN.         Prior Anticoagulation Instructions: INR 3.3  Today, Wednesday, November 30th, do not take Coumadin. Then, resume taking Coumadin 1 tab (3 mg) on all days except for Coumadin 0.5 tab (1.5 m) on Thursdays.  Return to clinic in 4 weeks.   Current Anticoagulation Instructions: INR 2.7 Continue 1  pill everyday except 1/2 pill on Thursdays. Recheck in 4 weeks.

## 2010-08-21 NOTE — Assessment & Plan Note (Signed)
Summary: F8M/DM   Visit Type:  Follow-up Referring Provider:  Dr. Dereck Ligas Na Primary Provider:  [facility]  CC:  6 month ROV; No complaints.  History of Present Illness: Ricardo Perkins is a pleasant  gentleman, who has a history of paroxysmal atrial fibrillation.  His LV function is normal.  He has also had a previous Myoview on November 10, 2007, that was interpreted as a possible  small area of lateral ischemia at the apex, but I reviewed this, and  in fact, felt it was most likely normal.  His TSH has been normal. I last saw him in June of 2011. Since then the patient denies any dyspnea on exertion, orthopnea, PND, pedal edema, palpitations, syncope or chest pain.   Current Medications (verified): 1)  Atenolol 50 Mg  Tabs (Atenolol) .... Take 1/2  Tablet By Mouth Two Times A Day 2)  Cvs Omeprazole 20 Mg  Tbec (Omeprazole) .... Take One Capsule Once Daily 3)  Flomax 0.4 Mg  Cp24 (Tamsulosin Hcl) .... Take One Capsule By Mouth Once Daily 4)  Vitamin D3 1000 Unit  Tabs (Cholecalciferol) .Marland Kitchen.. 1 By Mouth Daily 5)  Warfarin Sodium 3 Mg Tabs (Warfarin Sodium) .... As Directed 6)  Temazepam 30 Mg Caps (Temazepam) .... As Needed 7)  Amlodipine Besylate 5 Mg Tabs (Amlodipine Besylate) .... Take One Tablet By Mouth Daily 8)  Fish Oil 1000 Mg Caps (Omega-3 Fatty Acids) .... Take 1 By Mouth Once Daily  Allergies (verified): No Known Drug Allergies  Past History:  Past Medical History: Reviewed history from 04/01/2009 and no changes required. COPD - O2 dependent since 12-Dec-2005 in [place], [place]. Never intubated. No PFTs till April 2009.   Atrial Fibrillation - 09/2007. Pending appt with Dr. Olga Millers April 2009 BPH -  Hx of Pneumonia 12/12/05 and 12-13-07 Urosepsis - 2009 March Hypertension - on atenelol since Dec 12, 2001 HOH  Hematuria - at hospital admit march 2009 in [place].  Insomnia Osteoarthritis  Past Surgical History: Reviewed history from 07/23/2008 and no changes required. Appendectomy  @23  Tonsillectomy Prior back surgery  Social History: Reviewed history from 07/23/2008 and no changes required. 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 Lives temporarily with daughter- moved up from [place], [place].    wife remains in [place] at this time - May '09 EOL: patient with daughter as witness clearly does not want CPR, Mechanical         ventilation or other heroic/futile measures. Remote history of tobacco abuse No ETOH  Review of Systems       no fevers or chills, productive cough, hemoptysis, dysphasia, odynophagia, melena, hematochezia, dysuria, hematuria, rash, seizure activity, orthopnea, PND, pedal edema, claudication. Remaining systems are negative.   Vital Signs:  Patient profile:   77 year old male Height:      67 inches Weight:      191 pounds BMI:     30.02 Pulse rate:   54 / minute BP sitting:   140 / 74  (right arm)  Vitals Entered By: Stanton Kidney, EMT-P (June 30, 2010 8:40 AM)  Physical Exam  General:  Well-developed well-nourished in no acute distress.  Skin is warm and dry.  HEENT is normal.  Neck is supple. No thyromegaly.  Chest is clear to auscultation with normal expansion.  Cardiovascular exam is regular  rate and rhythm.  Abdominal exam nontender or distended. No masses palpated. Extremities show no edema. neuro grossly intact    EKG  Procedure date:  06/30/2010  Findings:      Sinus bradycardia; first degree AV block; PVC; LVH; septal MI  Impression & Recommendations:  Problem # 1:  COUMADIN THERAPY (ICD-V58.61) Goal INR 2-3; managed in the coumadin clinic.  Problem # 2:  HYPERTENSION (ICD-401.9) Blood pressure controlled; continue present medications. His updated medication list for this problem includes:    Atenolol 50 Mg Tabs (Atenolol) .Marland Kitchen... Take 1/2   tablet by mouth two times a day    Amlodipine Besylate 5 Mg Tabs (Amlodipine besylate) .Marland Kitchen... Take one tablet by mouth daily  Problem # 3:  PAROXYSMAL ATRIAL FIBRILLATION (ICD-427.31) Patient remains in sinus; continue beta blocker and coumadin. His updated medication list for this problem includes:    Atenolol 50 Mg Tabs (Atenolol) .Marland Kitchen... Take 1/2  tablet by mouth two times a day    Warfarin Sodium 3 Mg Tabs (Warfarin sodium) .Marland Kitchen... As directed  Orders: EKG w/ Interpretation (93000)  Problem # 4:  C O P D (ICD-496)  Patient Instructions: 1)  Your physician recommends that you schedule a follow-up appointment in: 1 year 2)  Your physician recommends that you continue on your current medications as directed. Please refer to the Current Medication list given to you today.

## 2010-08-27 NOTE — Progress Notes (Signed)
Summary: need to stop Coumadin due to surgery   Phone Note Call from Patient Call back at Home Phone [phone]   Caller: Patient Summary of Call: Pt having surgery on 09/01/10 for eye surgery and need to stop Coumadin and need to know when it can be stop Initial call taken by: Judie Grieve,  August 19, 2010 10:57 AM  Follow-up for Phone Call        spoke with pt, he is needing to have two cyst removed from under his eye lid and also something removed from the corner of his eye. he needs to know if okay to hold coumadin and for how long prior to the procedure. will foward for dr [name] review Deliah Goody, RN  August 19, 2010 11:15 AM   Additional Follow-up for Phone Call Additional follow up Details #1::        OK to hold coumadin 4 days prior to surgery and resume at previous dose after. Ferman Hamming, MD, [facility]  August 19, 2010 11:26 AM     Additional Follow-up for Phone Call Additional follow up Details #2::    Spoke with pt.  He is aware to take last dose on 2/8.  Has f/u on 2/22.  He will restart with his normal dose of Coumadin when okay with MD.  Follow-up by: Weston Brass PharmD,  August 19, 2010 2:22 PM

## 2010-09-09 DIAGNOSIS — I4891 Unspecified atrial fibrillation: Secondary | ICD-10-CM

## 2010-09-10 ENCOUNTER — Encounter (INDEPENDENT_AMBULATORY_CARE_PROVIDER_SITE_OTHER): Payer: Medicare Other

## 2010-09-10 ENCOUNTER — Encounter: Payer: Self-pay | Admitting: Cardiology

## 2010-09-10 DIAGNOSIS — I4891 Unspecified atrial fibrillation: Secondary | ICD-10-CM

## 2010-09-10 DIAGNOSIS — Z7901 Long term (current) use of anticoagulants: Secondary | ICD-10-CM

## 2010-09-16 NOTE — Medication Information (Signed)
Summary: rov/sp   Anticoagulant Therapy  Managed by: Weston Brass, PharmD Referring MD: Olga Millers MD PCP: Link Snuffer MD: Shirlee Latch MD, Freida Busman Indication 1: Atrial Fibrillation (ICD-427.31) Lab Used: [facility] Site: [facility] INR RANGE 2 - 3  Dietary changes: no    Health status changes: no    Bleeding/hemorrhagic complications: no    Recent/future hospitalizations: yes       Details: stopped coumadin 5 days prior to eye surgery on 2/20.  Restarted coumadin on Monday night.  Any changes in medication regimen? no    Recent/future dental: no  Any missed doses?: yes     Details: Missed 5 doses for surgery  Is patient compliant with meds? yes       Allergies: No Known Drug Allergies  Anticoagulation Management History:      The patient is taking warfarin and comes in today for a routine follow up visit.  Positive risk factors for bleeding include an age of 75 years or older and presence of serious comorbidities.  Negative risk factors for bleeding include no history of CVA/TIA.  The bleeding index is 'intermediate risk'.  Positive CHADS2 values include History of HTN and Age > 75 years old.  Negative CHADS2 values include History of Diabetes and Prior Stroke/CVA/TIA.  The start date was 11/04/2007.  His last INR was 2.4 RATIO.  Anticoagulation responsible provider: Shirlee Latch MD, [place].  Cuvette Lot#: [id number].  Exp: 07/2011.    Anticoagulation Management Assessment/Plan:      The patient's current anticoagulation dose is Warfarin sodium 3 mg tabs: as directed.  The target INR is 2.0-3.0.  The next INR is due 10/01/2010.  Anticoagulation instructions were given to patient.  Results were reviewed/authorized by Weston Brass, PharmD.  He was notified by Margot Chimes PharmD Candidate.         Prior Anticoagulation Instructions: INR 2.7 Continue to take 1 tablet every day except Thursday take 1/2 tablet. Recheck in 4 weeks.  Current Anticoagulation Instructions: INR  1.7  Take 1 and 1/2 tablets tonight and then continue your normal dose of 1 tablet everyday except for Thursdays when you only take 1/2 tablet.  Recheck INR in 3 weeks.

## 2010-09-24 ENCOUNTER — Encounter: Payer: Self-pay | Admitting: Internal Medicine

## 2010-09-24 ENCOUNTER — Ambulatory Visit (INDEPENDENT_AMBULATORY_CARE_PROVIDER_SITE_OTHER): Payer: Medicare Other | Admitting: Internal Medicine

## 2010-09-24 DIAGNOSIS — N401 Enlarged prostate with lower urinary tract symptoms: Secondary | ICD-10-CM

## 2010-09-29 ENCOUNTER — Telehealth: Payer: Self-pay | Admitting: Internal Medicine

## 2010-09-30 NOTE — Assessment & Plan Note (Signed)
Summary: DIFFICULTY URINATING   ---STC   Vital Signs:  Patient profile:   77 year old male Height:      67 inches Weight:      183 pounds BMI:     28.77 O2 Sat:      93 % on Room air Temp:     97.4 degrees F oral Pulse rate:   52 / minute BP sitting:   126 / 78  (left arm) Cuff size:   regular  Vitals Entered By: Bill Salinas CMA (September 24, 2010 10:19 AM)  O2 Flow:  Room air CC: pt c/o difficulty urinating x 2 months and decreased urine flow, pt would also like to discuss temazepam/ ab   Primary Care Provider:  Sunaina Ferrando  CC:  pt c/o difficulty urinating x 2 months and decreased urine flow and pt would also like to discuss temazepam/ ab.  History of Present Illness: Mr. Elpers presents with a c/o slow stream, urinary frequency. He has several signs of prostatism despite taking tamsulosin 0.4 mg. He denies symptoms of urinary retention. He has no dysuria, rectal pain, perineal pain, fever or other signs of infection.  He reports that he has a problem with sleep duration insomnia. He initailly had good results with temazepam but this has gotten worse lately  so that he sleeps 2-3 hours a night or not at all. He practices good sleep hygiene except he does have wine in the evening after supper.   Current Medications (verified): 1)  Atenolol 50 Mg  Tabs (Atenolol) .... Take 1/2  Tablet By Mouth Two Times A Day 2)  Cvs Omeprazole 20 Mg  Tbec (Omeprazole) .... Take One Capsule Once Daily 3)  Flomax 0.4 Mg  Cp24 (Tamsulosin Hcl) .... Take One Capsule By Mouth Once Daily 4)  Vitamin D3 1000 Unit  Tabs (Cholecalciferol) .Marland Kitchen.. 1 By Mouth Daily 5)  Warfarin Sodium 3 Mg Tabs (Warfarin Sodium) .... As Directed 6)  Temazepam 30 Mg Caps (Temazepam) .... As Needed 7)  Amlodipine Besylate 5 Mg Tabs (Amlodipine Besylate) .... Take One Tablet By Mouth Daily 8)  Fish Oil 1000 Mg Caps (Omega-3 Fatty Acids) .... Take 1 By Mouth Once Daily  Allergies (verified): No Known Drug Allergies  Past  History:  Past Medical History: Last updated: 04/01/2009 COPD - O2 dependent since 22-Dec-2005 in [place], [place]. Never intubated. No PFTs till April 2009.   Atrial Fibrillation - 09/2007. Pending appt with Dr. Olga Millers April 2009 BPH -  Hx of Pneumonia December 22, 2005 and December 23, 2007 Urosepsis - 2009 March Hypertension - on atenelol since December 22, 2001 HOH  Hematuria - at hospital admit march 2009 in [place].  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 Lives temporarily with daughter- moved up from [place], [place].    wife remains in [place] at this time - May '09 EOL: patient with daughter as witness clearly does not want CPR, Mechanical         ventilation or other heroic/futile measures. Remote history of tobacco abuse No ETOH  Review of  Systems  The patient denies anorexia, fever, weight loss, decreased hearing, chest pain, dyspnea on exertion, prolonged cough, abdominal pain, severe indigestion/heartburn, muscle weakness, difficulty walking, abnormal bleeding, and enlarged lymph nodes.    Physical Exam  General:  Well-developed,well-nourished,in no acute distress; alert,appropriate and cooperative throughout examination Head:  normocephalic and atraumatic.   Eyes:  vision grossly intact, pupils equal, and pupils round.   Lungs:  normal respiratory effort.   Heart:  normal rate and regular rhythm.   Neurologic:  alert & oriented X3, cranial nerves II-XII intact, and gait normal.    Skin:  turgor normal and color normal.   Psych:  Oriented X3, memory intact for recent and remote, normally interactive, and good eye contact.     Impression & Recommendations:  Problem # 1:  BENIGN PROSTATIC HYPERTROPHY, WITH OBSTRUCTION (ICD-600.01) Mr. Livecchi with symptoms of prostatism uncontrolled with no evidence of infection.  Plan - increase tamsulosin to 0.4 two times a day           add finasteride daily.  His updated medication list for this problem includes:    Tamsulosin Hcl 0.4 Mg Caps (Tamsulosin hcl) .Marland Kitchen... 1 by mouth two times a day for bph with boo    Finasteride 5 Mg Tabs (Finasteride) .Marland Kitchen... 1 by mouth once daily for bph  Complete Medication List: 1)  Atenolol 50 Mg Tabs (Atenolol) .... Take 1/2  tablet by mouth two times a day 2)  Cvs Omeprazole 20 Mg Tbec (Omeprazole) .... Take one capsule once daily 3)  Tamsulosin Hcl 0.4 Mg Caps (Tamsulosin hcl) .Marland Kitchen.. 1 by mouth two times a day for bph with boo 4)  Vitamin D3 1000 Unit Tabs (Cholecalciferol) .Marland Kitchen.. 1 by mouth daily 5)  Warfarin Sodium 3 Mg Tabs (Warfarin sodium) .... As directed 6)  Zolpidem Tartrate 10 Mg Tabs (Zolpidem tartrate) .Marland Kitchen.. 1 by mouth qhs 7)  Amlodipine Besylate 5 Mg Tabs (Amlodipine besylate) .... Take one tablet by mouth daily 8)  Fish Oil 1000 Mg Caps (Omega-3 fatty acids) .... Take 1 by mouth once daily 9)  Finasteride 5 Mg Tabs (Finasteride) .Marland Kitchen.. 1 by mouth once daily for bph Prescriptions: FINASTERIDE 5 MG TABS (FINASTERIDE) 1 by mouth once daily for BPH  #30 x 12   Entered and Authorized by:   Jacques Navy MD   Signed by:   Jacques Navy MD on 09/24/2010   Method used:   Electronically to        [facility]. [phone]* (retail)       [phone]       [place], [place]  [address]       Ph: [phone]       Fax: [phone]   RxID:   [id number] TAMSULOSIN HCL 0.4 MG CAPS (TAMSULOSIN HCL) 1 by mouth two times a day for BPH with BOO  #60 x 12    Entered and Authorized by:   Jacques Navy MD   Signed by:   Jacques Navy MD on 09/24/2010   Method used:   Electronically to        [facility]. [phone]* (retail)       [phone]       [place], [place]  [address]       Ph: [phone]       Fax: [phone]   RxID:   [id number]  ZOLPIDEM TARTRATE 10 MG TABS (ZOLPIDEM TARTRATE) 1 by mouth qHS  #30 x 12   Entered and Authorized by:   Jacques Navy MD   Signed by:   Jacques Navy MD on 09/24/2010   Method used:   Handwritten   RxID:   [id number]    Orders Added: 1)  Est. Patient Level III [56213]

## 2010-10-01 ENCOUNTER — Encounter: Payer: Self-pay | Admitting: Cardiology

## 2010-10-01 ENCOUNTER — Encounter (INDEPENDENT_AMBULATORY_CARE_PROVIDER_SITE_OTHER): Payer: Medicare Other

## 2010-10-01 DIAGNOSIS — Z7901 Long term (current) use of anticoagulants: Secondary | ICD-10-CM

## 2010-10-01 DIAGNOSIS — I4891 Unspecified atrial fibrillation: Secondary | ICD-10-CM

## 2010-10-07 NOTE — Progress Notes (Signed)
Summary: SLEEP MED   Phone Note Call from Patient   Summary of Call: Pt says that ambien causes upset stomach and "messes up his head". He will be resuming the previous medication he was on.  Initial call taken by: Lamar Sprinkles, CMA,  September 29, 2010 11:06 AM  Follow-up for Phone Call        noted. Med list updated. Follow-up by: Jacques Navy MD,  September 29, 2010 1:08 PM    New/Updated Medications: TEMAZEPAM 30 MG CAPS (TEMAZEPAM) 1 by mouth at bedtime as needed

## 2010-10-07 NOTE — Medication Information (Signed)
Summary: rov/sp  Anticoagulant Therapy  Managed by: Windell Hummingbird, RN Referring MD: Olga Millers MD PCP: Link Snuffer MD: Riley Kill MD, Maisie Fus Indication 1: Atrial Fibrillation (ICD-427.31) Lab Used: [facility] Site: [facility] INR POC 2.1 INR RANGE 2 - 3  Dietary changes: no    Health status changes: no    Bleeding/hemorrhagic complications: no    Recent/future hospitalizations: no    Any changes in medication regimen? yes       Details: Added Finasteride and increased dosage on Tamulasin  Recent/future dental: no  Any missed doses?: no       Is patient compliant with meds? yes       Allergies: No Known Drug Allergies  Anticoagulation Management History:      The patient is taking warfarin and comes in today for a routine follow up visit.  Positive risk factors for bleeding include an age of 75 years or older and presence of serious comorbidities.  Negative risk factors for bleeding include no history of CVA/TIA.  The bleeding index is 'intermediate risk'.  Positive CHADS2 values include History of HTN and Age > 32 years old.  Negative CHADS2 values include History of Diabetes and Prior Stroke/CVA/TIA.  The start date was 11/04/2007.  His last INR was 2.4 RATIO.  Anticoagulation responsible provider: Riley Kill MD, Maisie Fus.  INR POC: 2.1.  Cuvette Lot#: [id number].  Exp: 09/2011.    Anticoagulation Management Assessment/Plan:      The patient's current anticoagulation dose is Warfarin sodium 3 mg tabs: as directed.  The target INR is 2.0-3.0.  The next INR is due 10/22/2010.  Anticoagulation instructions were given to patient.  Results were reviewed/authorized by Windell Hummingbird, RN.  He was notified by Windell Hummingbird, RN.         Prior Anticoagulation Instructions: INR 1.7  Take 1 and 1/2 tablets tonight and then continue your normal dose of 1 tablet everyday except for Thursdays when you only take 1/2 tablet.  Recheck INR in 3 weeks.    Current Anticoagulation  Instructions: INR 2.1 Continue taking 1 tablet every day, except take 1/2 tablet on Thursdays. Recheck in 3 weeks.

## 2010-10-20 ENCOUNTER — Other Ambulatory Visit: Payer: Self-pay | Admitting: Internal Medicine

## 2010-10-22 ENCOUNTER — Ambulatory Visit (INDEPENDENT_AMBULATORY_CARE_PROVIDER_SITE_OTHER): Payer: Medicare Other | Admitting: *Deleted

## 2010-10-22 DIAGNOSIS — I4891 Unspecified atrial fibrillation: Secondary | ICD-10-CM

## 2010-10-22 DIAGNOSIS — Z7901 Long term (current) use of anticoagulants: Secondary | ICD-10-CM

## 2010-10-22 LAB — POCT INR: INR: 3.4

## 2010-10-22 NOTE — Patient Instructions (Signed)
Skip your dose today. Then resume normal dosing schedule of 1 tablet everyday except a half tablet on Thursday. Return to clinic in 2 weeks.

## 2010-10-28 LAB — CBC
HCT: 41.4 % (ref 39.0–52.0)
MCHC: 34 g/dL (ref 30.0–36.0)
MCV: 91.1 fL (ref 78.0–100.0)
Platelets: 172 10*3/uL (ref 150–400)
WBC: 5.2 10*3/uL (ref 4.0–10.5)

## 2010-10-28 LAB — BASIC METABOLIC PANEL
BUN: 20 mg/dL (ref 6–23)
CO2: 23 mEq/L (ref 19–32)
Chloride: 110 mEq/L (ref 96–112)
Creatinine, Ser: 1.33 mg/dL (ref 0.4–1.5)
Potassium: 4.1 mEq/L (ref 3.5–5.1)

## 2010-10-28 LAB — PROTIME-INR
INR: 2.2 — ABNORMAL HIGH (ref 0.00–1.49)
Prothrombin Time: 25.6 seconds — ABNORMAL HIGH (ref 11.6–15.2)

## 2010-10-28 LAB — GLUCOSE, CAPILLARY: Glucose-Capillary: 123 mg/dL — ABNORMAL HIGH (ref 70–99)

## 2010-10-28 LAB — TROPONIN I: Troponin I: 0.02 ng/mL (ref 0.00–0.06)

## 2010-11-03 ENCOUNTER — Encounter: Payer: Self-pay | Admitting: Internal Medicine

## 2010-11-03 ENCOUNTER — Ambulatory Visit (INDEPENDENT_AMBULATORY_CARE_PROVIDER_SITE_OTHER): Payer: Medicare Other | Admitting: Internal Medicine

## 2010-11-03 VITALS — BP 132/72 | HR 57 | Temp 97.8°F | Ht 67.0 in | Wt 184.8 lb

## 2010-11-03 DIAGNOSIS — H9201 Otalgia, right ear: Secondary | ICD-10-CM

## 2010-11-03 DIAGNOSIS — H9209 Otalgia, unspecified ear: Secondary | ICD-10-CM

## 2010-11-03 MED ORDER — NEOMYCIN-POLYMYXIN-HC 3.5-10000-1 OT SUSP: 2.0000 [drp] | Freq: Three times a day (TID) | OTIC | Status: AC

## 2010-11-03 NOTE — Patient Instructions (Signed)
It was good to see you today. Will use ear drops for your pain as in 2010 - Your prescription(s) have been submitted to your pharmacy. Please take as directed and contact our office if you believe you are having problem(s) with the medication(s). if these drops do not improve your pain symptoms, or if pain worse, call for further reevaluation as needed -  I will notify Dr. Debby Bud of your visit here today

## 2010-11-03 NOTE — Progress Notes (Signed)
  Subjective:    Patient ID: Ricardo Perkins, male    DOB: Oct 19, 1922, 77 y.o.   MRN: [id number]  HPI complains of right ear pain Onset 4 days ago - intermittent pain symptoms Pain 6-7/10 when bad, currently mild pain symptoms - describes as "burning" No hearing change, no vision change, no drainage but tender to touch R ear No pulsing but occasionally associated with HA over right temple No fever, no rash or blisters Improved with oce pak but continued recurring episodes +hx same in 2010 - uses ear drops with complete relief  Past Medical History  Diagnosis Date  . INSOMNIA, PERSISTENT 08/22/2008  . DECREASED HEARING 10/26/2007  . HYPERTENSION 07/23/2008  . BRADYCARDIA 04/01/2009  . C O P D 10/26/2007  . RENAL INSUFFICIENCY 04/01/2009  . BENIGN PROSTATIC HYPERTROPHY, WITH OBSTRUCTION 11/21/2007  . ASTEATOTIC ECZEMA 03/29/2008  . INSOMNIA UNSPECIFIED 11/21/2007  . LEG EDEMA, BILATERAL 01/07/2010  . PAROXYSMAL ATRIAL FIBRILLATION 11/21/2007    Review of Systems  HENT: Negative for hearing loss, nosebleeds, congestion, neck pain, tinnitus and ear discharge.   Eyes: Negative for pain and visual disturbance.  Respiratory: Negative for cough and shortness of breath.   Cardiovascular: Negative for chest pain.  Neurological: Negative for facial asymmetry, light-headedness and numbness.       Objective:   Physical Exam  Constitutional: He is oriented to person, place, and time. He appears well-developed and well-nourished. No distress.  HENT:  Head: Normocephalic and atraumatic.  Left Ear: External ear normal.  Nose: Nose normal.  Mouth/Throat: Oropharynx is clear and moist. No oropharyngeal exudate.       R ear with min swelling, no erythema - TM clear without effusion - no cerumen bilaterally  Eyes: Conjunctivae and EOM are normal. Pupils are equal, round, and reactive to light. No scleral icterus.  Neck: Normal range of motion. Neck supple. No thyromegaly present.  Cardiovascular: Normal rate and  regular rhythm.   No murmur heard. Pulmonary/Chest: Effort normal and breath sounds normal. No respiratory distress.  Lymphadenopathy:    He has no cervical adenopathy.  Neurological: He is alert and oriented to person, place, and time. No cranial nerve deficit. Coordination normal.  Skin: Skin is warm and dry. No rash noted. No erythema.       No vesicles around right ear or temporal side of head/scalp  Psychiatric: He has a normal mood and affect. His behavior is normal.       Lab Results  Component Value Date   WBC 5.2 01/06/2010   HGB 15.8 01/06/2010   HCT 47.1 01/06/2010   PLT 165.0 01/06/2010   NA 142 01/06/2010   K 4.7 01/06/2010   CL 107 01/06/2010   CREATININE 1.5 01/06/2010   BUN 32* 01/06/2010   CO2 29 01/06/2010   TSH 0.87 11/10/2007   INR 3.4 10/22/2010    Assessment & Plan:  R ear pain - hx same with otitis externa 03/2009 - resolved with abx drops - retx same now If continued or worsening pain symptoms, pt to call for re-evaluation to consider other dx labs/tests as needed DDX includes shingles (but no rash evident), trigeminal neuralgia but pain is wax/wane - also consider TA if persisting or unimproved

## 2010-11-05 ENCOUNTER — Ambulatory Visit (INDEPENDENT_AMBULATORY_CARE_PROVIDER_SITE_OTHER): Payer: Medicare Other | Admitting: *Deleted

## 2010-11-05 DIAGNOSIS — I4891 Unspecified atrial fibrillation: Secondary | ICD-10-CM

## 2010-11-05 LAB — POCT INR: INR: 2.4

## 2010-11-24 ENCOUNTER — Other Ambulatory Visit: Payer: Self-pay | Admitting: Cardiology

## 2010-12-02 NOTE — Op Note (Signed)
Ricardo Perkins, Ricardo Perkins                ACCOUNT NO.:  [id number]   MEDICAL RECORD NO.:  [id number]          PATIENT TYPE:  AMB   LOCATION:  SDS                          FACILITY:  [facility]   PHYSICIAN:  Charlynne Pander, D.D.S.DATE OF BIRTH:  22-May-1923   DATE OF PROCEDURE:  05/01/2008  DATE OF DISCHARGE:                               OPERATIVE REPORT   PREOPERATIVE DIAGNOSES:  1. Paroxysmal atrial fibrillation.  2. Chronic warfarin therapy  3. Chronic obstructive pulmonary disease with 24-hour oxygen therapy      via nasal cannula.  4. Chronic periodontitis.  5. Apical periodontitis.  6. Multiple retained root segments.  7. Bilateral mandibular exostoses.   POSTOPERATIVE DIAGNOSES:  1. Paroxysmal atrial fibrillation.  2. Chronic warfarin therapy  3. Chronic obstructive pulmonary disease with 24-hour oxygen therapy      via nasal cannula.  4. Chronic periodontitis.  5. Apical periodontitis.  6. Multiple retained root segments.  7. Bilateral mandibular exostoses.   OPERATIONS:  1. Extraction of remaining teeth (tooth numbers #5, #7, #8, #10, #11,      #12, #13, #15, #21, #22, #23, #24, #26, #27, #28, #29, #30, #31).  2. Four-quadrants of alveoloplasty.  3. Bilateral mandibular lingual tori reductions.   SURGEON:  Charlynne Pander, DDS   ASSISTANT:  Gypsy Decant (dental assistant).   ANESTHESIA:  General anesthesia via nasoendotracheal tube.   MEDICATIONS:  1. Ancef 1 g IV prior to invasive dental procedures.  2. Local anesthesia with a total utilization of 6 carpules, each      containing 34 mg of lidocaine with 0.017 mg of epinephrine as well      as 2 carpules, each containing 9 mg of bupivacaine with 0.009 mg of      epinephrine.   SPECIMENS:  There were 15 teeth that were discarded.   Cultures were none.   Drains were none.   Complications were none.   Fluids were 1450 mL of lactated Ringer solution.   Estimated blood loss was approximately 100 mL.   INDICATIONS:  The patient with multiple medical comorbidities including  atrial fibrillation with chronic warfarin therapy as well as  COPD/emphysema.  Dental consultation was requested to evaluate the  patient to provide treatment as indicated.  The patient was examined and  treatment planned for extraction of remaining teeth with alveoloplasty  and pre-prosthetic surgery was indicated.  This treatment plan was  formulated to decrease the risk and complications associated with dental  infection from affecting the patient's systemic health.   OPERATIVE FINDINGS:  The patient was examined in the operating room #3.  The teeth were identified for extraction.  The patient was noted to be  affected by chronic periodontitis, apical periodontitis, multiple  retained root segments, and the presence of bilateral mandibular lingual  tori.  This necessitated removal of all remaining teeth with  alveoloplasty and pre-prosthetic surgery as indicated.   DESCRIPTION OF PROCEDURE:  The patient was brought to the main operating  room #3.  The patient was then placed in a supine position on operating  room table.  General anesthesia  was then induced via an nasoendotracheal  tube.  A time-out was then performed and the patient was identified,  procedures verified.  The patient was then prepped and draped in the  usual manner for dental medicine procedure.  A throat pack was placed at  this time.  The patient was then examined with the findings noted above.  The patient was then ready for the dental medicine procedure as follows:   The maxillary quadrants were first approached.  The patient was  anesthetized utilizing the lidocaine with epinephrine and infiltration  on the facial and palatal aspects. The maxillary right quadrant was  first approached.  A #15 blade incision was then made from the distal of  #3 and extended to the distal of #15.  The surgical flap was then  carefully reflected.  Appropriate  amounts of buccal and interseptal bone  were removed as indicated.  The teeth were then subluxated with a series  of straight elevators.  Tooth numbers #5, #7, #8, #10, #11, #12, #13,  and #15 were then removed with a 150 forceps without complications.  Alveoplasty was then performed utilizing rongeurs and bone file.  The  surgical site was then irrigated with copious amounts of sterile saline  x2.  A piece of Surgicel was then placed in these extraction socket  appropriately.  The tissues were then approximated and trimmed  appropriately.  The surgical site was then closed from the distal of #3  and extended to the mesial of #8 utilizing 3-0 chromic gut suture in a  continuous interrupted suture technique x1.  The maxillary left quadrant  was then approached and the surgical site was then closed from the  distal of #15 and extended to the mesial of #9 utilizing 3-0 chromic gut  suture in a continuous interrupted suture technique x1.  One individual  suture was then placed to further close the surgical site.   At this point in time, the mandibular quadrants were approached.  The  patient was anesthetized utilizing bilateral inferior alveolar nerve  blocks and bupivacaine with epinephrine.  Further infiltration was then  achieved utilizing the lidocaine with epinephrine appropriately.  At  this point in time, the mandibular left quadrant was approached.  A #15  blade incision was then made from the distal #19 and extended to the  distal of #32.  The surgical flap was then carefully reflected.  Appropriate amounts of buccal and interseptal bone were removed with a  surgical handpiece and bur with copious amounts of sterile saline.  The  teeth were then subluxated with a series of straight elevators.  Tooth  numbers #21, #22, #23,  #24, #26, #27, #28, #29, #30, and #31 were then  removed with a 151 forceps without complications.  Alveoplasty was then  performed utilizing rongeurs and bone  file.  At this point in time, the  mandibular left and mandibular right exostoses on the lingual aspect  were visualized and removed with a surgical handpiece and bur with  copious amounts of sterile saline.  The surgical site was then had  further alveoloplasty performed on it with rongeurs and bone file.  The  surgical sites were then irrigated with copious amounts of sterile  saline x4.  At this point in time, Surgicel was placed in each  extraction socket appropriately.  The tissues were then approximated and  trimmed appropriately.  The surgical site was then closed.  The  mandibular left from the distal of #19 and extended to  the mesial of #24  utilizing 3-0 chromic gut suture in a continuous interrupted suture  technique x1.  The mandibular right quadrant was then closed from the  distal of #32 and extended to the mesial of #25 utilizing 3-0 chromic  gut suture in a continuous interrupted suture technique x1.   At this point in time, the entire mouth was irrigated with copious  amounts of sterile saline.  The patient was examined for complications,  seeing none, dental medicine procedure was deemed to be complete.  At  this point in time, a series of 4x4 gauzes were placed in the mouth with  topical [place] and 5% placed on the gauze to further aid hemostasis and  maintain antifibrinolytic activity.  This was done on the maxillary  right and maxillary left quadrants.  An oral airway was then placed at  the requested of the anesthesia team.  The patient was then handed over  to the anesthesia team for final disposition.  After appropriate amount  of time, the patient was extubated and taken to the postanesthesia care  unit with stable vital signs and good oxygenation level.  All counts  were correct for dental medicine procedure.  The patient will be seen in  approximately 1 week for evaluation for suture removal.  [facility] has been contacted and will admit the patient if  required  with further consultation with Pulmonary Medicine as indicated as well.      Charlynne Pander, D.D.S.  Electronically Signed     RFK/MEDQ  D:  05/01/2008  T:  05/02/2008  Job:  [id number]   cc:   Madolyn Frieze. [name], MD, [facility]  Rosalyn Gess. Norins, MD  Kalman Shan, MD  Sanda Linger, MD

## 2010-12-02 NOTE — Assessment & Plan Note (Signed)
[facility]                            CARDIOLOGY OFFICE NOTE   ANSAR, SKODA                         MRN:          [id number]  DATE:11/07/2007                            DOB:          10-Aug-1922    [name] is an 77 year old gentleman who presents for evaluation of  paroxysmal atrial fibrillation.  The patient has previously residing in  [place] and recently moved to [place] and would like to establish  here.  Apparently, he had an episode of atrial fibrillation at [facility] in [place].  This occurred in the setting of an UTI and a COPD  exacerbation.  He was placed on Coumadin.  He had an echocardiogram at  that time.  This was performed on October 14, 2007.  He had normal LV  function with an estimated ejection fraction of 65%.  There was mild  LVH.  There was mild aortic insufficiency.  There was trace mitral  regurgitation.  Note, the patient also apparently had a TSH which was  low at 0.29 with lower limit of normal at that hospital being 0.3.  The  patient does have dyspnea on exertion, but is on home oxygen.  However,  there is no orthopnea, PND, pedal edema, palpitations, presyncope,  syncope or exertional chest pain.   MEDICATIONS:  1. Nifedipine 90 mg p.o. daily.  2. Atenolol 50 mg p.o. b.i.d.  3. Omeprazole 20 mg p.o. daily.  4. Jantoven 3 mg p.o. nightly.  5. Digoxin 250 mcg p.o. daily.  6. Advair 250/50 daily.  7. Flomax 0.4 mg p.o. daily.  8. Spiriva 18 mcg p.o. daily.  9. Home oxygen.  10.Coumadin.   ALLERGIES:  HE HAS KNOWN DRUG ALLERGIES.   SOCIAL HISTORY:  He has a history of tobacco use, but has not smoked in  2 years.  He does not consume alcohol.   FAMILY HISTORY:  Positive for coronary artery disease.  His father died  of a myocardial infarction at age 1.  His brother had a myocardial  infarction in his 46s.   PAST MEDICAL HISTORY:  There is no diabetes mellitus or hyperlipidemia,  but there is  hypertension.  He has severe COPD and is oxygen dependent.  He has paroxysmal atrial fibrillation as described in the HPI.  He has  had a prior tonsillectomy.  He also has a history of osteoarthritis.  He  had remote back surgery.   REVIEW OF SYSTEMS:  He denies any headaches, fevers or chills.  There is  no productive cough or hemoptysis.  There is no dysphagia, odynophagia,  melena or hematochezia.  There is no dysuria.  There are no rashes or  seizure activity.  There is no orthopnea or PND, but can occasionally  have mild pedal edema.  The remaining systems are negative.   PHYSICAL EXAMINATION:  VITAL SIGNS:  Today, shows a blood pressure of  130/72 and his pulse is 48.  He weighs 174 pounds.  GENERAL:  He is well-developed and well-nourished, no acute distress.  SKIN:  Warm and dry.  PSYCHIATRIC:  He does  not appear depressed.  There is no peripheral clubbing.  BACK:  Normal.  HEENT:  Normal with normal eyelids.  NECK:  Supple with a normal upstroke bilaterally.  No bruits heard.  There is no jugulovenous distention and no thyromegaly is noted.  CHEST:  Clear to auscultation with normal expansion.  CARDIOVASCULAR:  Bradycardic rate, but a regular rhythm.  There are no  murmurs, rubs or gallops noted.  ABDOMEN:  Nontender, nondistended.  Positive bowel sounds.  No  hepatosplenomegaly.  There is a question of a pulsatile mass on exam and  a mid abdominal bruit.  He has 2+ femoral pulses bilaterally.  No  bruits.  EXTREMITIES:  Show trace edema bilaterally.  There are no cords  palpated.  He has 2+ posterior tibial pulses bilaterally.  NEUROLOGIC:  Grossly intact.  His electrocardiogram shows sinus  bradycardia at a rate of 46.  There is a first-degree AV block.  He has  left ventricle hypertrophy.  There appears to be a prior anterior  infarct and a prior inferior infarct cannot be excluded.   DIAGNOSES:  1. Paroxysmal atrial fibrillation - we had a long discussion today       concerning the risks of an embolic event.  He has CHADS2 score of 2      for hypertension and age greater than 52.  I think he should      continue on Coumadin with goal INR of 2-3, and he understands this.      We will have him be seen in our Coumadin clinic and followed there.      Note, his LV function has been normal by a recent echocardiogram.      His TSH was low at that previous hospital and we will plan to      repeat that.  He will continue on his beta-blocker for rate      control, but I would decrease his digoxin to 125 mcg p.o. daily      given his bradycardia and age.  Note, we will also schedule him to      have a Myoview for risk stratification.  2. Mid abdominal bruit/pulsatile mass - we will check an ultrasound to      exclude aneurysm.  3. Hypertension - his blood pressure appears to be adequately      controlled.  4. Chronic obstructive pulmonary disease - he will continue on his      home oxygen and present medications, and he will follow up with Dr.      Debby Bud, who will be his primary care physician.   The patient will see me back in 3 months.     Madolyn Frieze [name], MD, [facility]  Electronically Signed    BSC/MedQ  DD: 11/07/2007  DT: 11/07/2007  Job #: [id number]

## 2010-12-02 NOTE — Assessment & Plan Note (Signed)
[facility]                            CARDIOLOGY OFFICE NOTE   MACHAI, DESMITH                         MRN:          [id number]  DATE:07/26/2008                            DOB:          [birth year]    Ricardo Perkins is a pleasant 77 year old gentleman, who has a history of  paroxysmal atrial fibrillation.  His LV function is normal.  He has also  had a previous Myoview on November 10, 2007, that was interpreted as  possible a small area of lateral ischemia at the apex, but I reviewed  this, in fact, it was most likely normal.  His TSH has been normal.  Since I last saw him, there is no dyspnea, chest pain, palpitations, or  syncope and there is no bleeding.  There is no pedal edema in the  mornings, although there can be occasional mild pedal edema in the  evenings.   MEDICATIONS:  1. Nifedipine 90 mg p.o. daily.  2. Atenolol 50 mg p.o. b.i.d.  3. Omeprazole 20 mg p.o. daily.  4. Jantoven 3 mg p.o. nightly.  5. Flomax 0.4 mg p.o. daily.  6. Spiriva 18 mcg daily.  7. Home oxygen.  8. Coumadin as directed.  9. Zolpidem.  10.His digoxin was discontinued.   PHYSICAL EXAMINATION:  VITAL SIGNS:  Blood pressure of 120/80 and his  pulse is 54.  He weighs 178 pounds.  HEENT:  Normal.  NECK:  Supple.  CHEST:  Clear.  CARDIOVASCULAR:  Regular rate.  ABDOMEN:  No tenderness.  EXTREMITIES:  No edema.   Electrocardiogram shows sinus rhythm with a first-degree AV block.  There is left ventricle hypertrophy.  There is a prior anterior infarct.   DIAGNOSES:  1. Paroxysmal atrial fibrillation - the patient remains in sinus      rhythm today.  We will continue with his atenolol and his heart      rate is much improved off of the digoxin.  He will also continue      with Coumadin with goal INR 2-3 as he has his embolic risk factors      of hypertension and age greater than 75.  2. Hypertension - his blood pressure is adequately controlled on his      present  medications.  3. Chronic obstructive pulmonary disease.  4. Coumadin therapy.   We will see him back in 9 months.     Madolyn Frieze [name], MD, [facility]  Electronically Signed    BSC/MedQ  DD: 07/26/2008  DT: 07/27/2008  Job #: [id number]

## 2010-12-02 NOTE — Assessment & Plan Note (Signed)
[facility]                            CARDIOLOGY OFFICE NOTE   Ricardo Perkins, Ricardo Perkins                         MRN:          [id number]  DATE:02/08/2008                            DOB:          13-Apr-1923    Ricardo Perkins is a very pleasant 77 year old gentleman that I recently saw  on November 07, 2007, secondary to history of paroxysmal atrial  fibrillation.  At that time, we noted that his LV function by a previous  echocardiogram had been normal.  We scheduled him to have a Myoview,  which was interpreted by Dr. Diona Browner.  This was performed on November 10, 2007.  There was a question of mild ischemia in the lateral apical  walls.  I have reviewed these images and felt that it was most likely  normal.  Certainly, it is low risk.  He also had an abdominal ultrasound  that showed no aneurysm.  Finally, he had a TSH, which was normal at  0.87.  Since I last saw him, he denies any dyspnea, chest pain,  palpitations, or syncope.  Occasionally, he has mild pedal edema.   MEDICATIONS:  1. Nifedipine 90 mg p.o. daily.  2. Atenolol 50 mg p.o. b.i.d.  3. Omeprazole 20 mg p.o. daily.  4. Jantoven 3 mg p.o. nightly.  5. Advair.  6. Flomax.  7. Spiriva.  8. Home oxygen.  9. Coumadin.  10.Zolpidem.  11.Digoxin 125 mcg p.o. daily.   PHYSICAL EXAMINATION:  VITAL SIGNS:  Blood pressure of 107/62 and his  pulse is 44.  He weighs 177 pounds.  HEENT:  Normal.  NECK:  Supple.  CHEST:  Clear.  CARDIOVASCULAR:  Bradycardic rate, but a regular rhythm.  ABDOMEN:  No tenderness.  EXTREMITIES:  No edema.   DIAGNOSES:  1. Paroxysmal atrial fibrillation - The patient remains in sinus      rhythm today, but his heart rate is somewhat low.  I will      discontinue his digoxin.  We may need to decrease his atenolol in      the future.  Note, he is having no symptoms from this.  He will      continue on Coumadin with goal INR of 2-3.  His left ventricular      function is  normal as well as his TSH.  2. Hypertension - His blood pressure is adequately controlled on his      present medications.  3. Chronic obstructive pulmonary disease - Management per Dr. Debby Bud.  4. Coumadin therapy.   We will see him back in 6 months and repeat his laboratories at that  time.     Madolyn Frieze [name], MD, [facility]  Electronically Signed    BSC/MedQ  DD: 02/08/2008  DT: 02/09/2008  Job #: [id number]

## 2010-12-02 NOTE — Consult Note (Signed)
[name], [name] NO.:  [id number]   MEDICAL RECORD NO.:  [id number]          PATIENT TYPE:   LOCATION:                                 FACILITY:   PHYSICIAN:  Charlynne Pander, D.D.S.DATE OF BIRTH:  05-26-1923   DATE OF CONSULTATION:  04/18/2008  DATE OF DISCHARGE:                                 CONSULTATION   Ricardo Perkins is an 77 year old male referred by Dr. Olga Millers for  dental consultation.  The patient with long-standing medical  complications including paroxysmal atrial fibrillation on chronic  warfarin therapy.  The patient also with extensive COPD with a history  of multiple pneumonias over the past 2 years.  Dental consultation now  requested to evaluate dentition and provide dental treatment as  indicated to prevent further complications to his overall systemic  health.   PAST MEDICAL HISTORY:  1. Paroxysmal atrial fibrillation, currently on warfarin therapy.      Last INR was approximately 2.5 on April 17, 2008.  The patient      is followed by Dr. Olga Millers.  2. COPD/emphysema with oxygen therapy at 4 liters per minute now on a      24-hour basis.  The patient is followed by Dr. Marchelle Gearing.  3. Hypertension.  4. Benign prostatic hypertrophy, on Flomax.  5. History of glaucoma status post laser surgery with no current eye      drops.  6. History of osteoarthritis, primarily affecting the right knee.  7. History of the gastroesophageal reflux disorder, on omeprazole.  8. Hearing deficit.  9. History of pneumonia x2 over the past 2 years with the last one      being in March 2009 in [place].  10.Status post appendectomy in 1943.  11.Status post tonsillectomy in 1936.   ALLERGIES/ADVERSE DRUG REACTIONS:  None known.   MEDICATIONS:  1. Nifedipine 90 mg every morning.  2. Omeprazole 20 mg every morning.  3. Flomax 0.4 mg every morning.  4. Atenolol 50 mg twice daily.  5. Warfarin 3 mg every day.  6. Zolpidem at bedtime  as needed.  7. Advair Diskus 250/50 one puff every morning.  8. Spiriva inhalation therapy every morning.  9. Albuterol inhalation therapy as needed.   SOCIAL HISTORY:  The patient is retired.  The patient previously lived  in [place], but moved to [place] to be with his daughter Ricardo Perkins)  in March 2009.  The patient with a history of smoking 1 pack per  day for 25-30 years, but quit approximately 2 years ago.  The patient  with occasional use of alcohol.   FAMILY HISTORY:  Father died at the age of 76 from myocardial  infarction.  Brother died at the age of 74 from myocardial infarction.  Mother died at age of 83 from old age, but did have a history of colon  cancer.   FUNCTIONAL ASSESSMENT:  The patient remains independent for ADLs.   REVIEW OF SYSTEMS:  This was reviewed with the patient and daughter and  is included in dental consultation record.   DENTAL HISTORY:  CHIEF COMPLAINT:  Dental consultation requested to rule out dentition as  a source of the systemic infection.   HISTORY OF PRESENT ILLNESS:  The patient with significant medical  morbidity including chronic warfarin therapy.  The patient initially  seen by Dr. Dutch Quint in Oral Surgery who referred the patient to  Dental Medicine to assist in extraction of teeth, utilizing heparin  bridge therapy as indicated.  The patient most likely will need  admission to achieve this process.  The patient then to have dental  extractions performed and returned to therapeutic anticoagulation levels  appropriately.   The patient currently denies acute toothaches, but does have a history  of remote toothaches several months to a year ago.  The patient last  seen initially by primary dentist for evaluation, subsequently found to  need multiple extractions and was then seen by Dr. Chales Salmon who then  referred the patient to me for overall final disposition.  The patient  really has not seen a dentist in over 20 years.   The patient does give a  history of having problems associated with a dentist who is actually the  patient's uncle.   DENTAL EXAM:  GENERAL:  The patient is a well-developed, well-nourished  male in no acute distress.  VITAL SIGNS:  Blood pressure is 113/52, pulse rate of 45, and  temperature is 97.2.  NECK:  There is no palpable lymphadenopathy.  The patient denies acute  TMJ symptoms.  INTRAORAL.  The patient has normal saliva.  There is a presence of a  mandibular left lingual torus.  There is no evidence of abscess  formation within the mouth at this time.  DENTITION:  The patient is missing multiple teeth including tooth  numbers #1, #2, #3, #4, #6, #9, #14, #16, #17, #18, #19, #20, #25, and  #32.  The patient has retained roots in the area tooth numbers #5, #8,  #10, #11, #12, #13, #15, #29, and #30.  DENTAL CARIES.  The patient has rampant dental caries affecting the  remaining teeth.  PERIODONTAL:  The patient with chronic advanced periodontal disease with  plaque/calculus accumulations, generalized gingival recession, and  generalized tooth mobility of the remaining teeth.  ENDODONTIC:  The patient with a history of intermittent acute pulpitis  symptoms.  The patient has multiple areas of periapical pathology.  The  patient has no overt abscess at this time.  CROWN/BRIDGE:  The patient has crown on tooth #7.  PROSTHODONTIC:  The patient denies having any dentures.  OCCLUSION.  The patient with a poor occlusal scheme secondary to  multiple missing teeth, presence of multiple retained root segments, and  lack of replacement of the missing teeth with dental prostheses.   RADIOGRAPHIC INTERPRETATION:  A panoramic x-ray was taken.   There are multiple missing teeth.  There are multiple retained root  segments.  There is moderate to severe bone loss.  There is  supereruption and drifting of the unopposed teeth into the edentulous  areas.  There are multiple areas of periapical  radiolucency associated  with apices of teeth.  There are dental caries noted.  There is  radiographic calculus noted.   ASSESSMENT:  1. History of oral neglect.  2. Chronic periodontitis with bone loss.  3. Generalized gingival recession.  4. Generalized tooth mobility.  5. Multiple missing teeth.  6. Multiple retained root segments.  7. Rampant dental caries.  8. Mandibular left lingual torus.  9. Supereruption and drifting of the unopposed teeth into  the      edentulous areas.  10.Poor occlusal scheme.  11.Warfarin therapy with a risk for bleeding with invasive dental      procedures.  12.Significant chronic obstructive pulmonary disease with current 4      liters of oxygen per minute via nasal cannula.   PLAN/RECOMMENDATION:  1. I discussed the risks, benefits, and complications of various      treatment options with the patient and his daughter in relationship      to his overall medical condition.  We discussed various treatment      options to include no treatment, total and subtotal extractions      with alveoloplasty, preprosthetic surgery as indicated, periodontal      therapy, dental restorations, root canal therapy, crown and bridge      therapy, implant therapy, and replacing missing teeth as indicated      after adequate healing.  The patient and daughter currently wish to      proceed with extraction of all remaining teeth with alveoloplasty      and preprosthetic surgery as indicated in the operating room.  The      patient will undergo bridge therapy from warfarin to heparin, back      to warfarin as indicated after further discussion with Dr. Olga Millers.  Once the overall plan for the bridge therapy is      determined, we will schedule an operating room procedure after      history and physical exam and surgical clearance has been obtained      through Cardiology.  The patient's daughter indicates that she will      contact Dr. Ludwig Clarks office to  assist in this coordination at      this time.  We will also obtain consultation from a pulmonary      specialist as indicated.  2. Discussion of findings with Dr. [name], Dr. Marchelle Gearing, and Dr.      Illene Regulus as indicated.      Charlynne Pander, D.D.S.  Electronically Signed     RFK/MEDQ  D:  04/18/2008  T:  04/18/2008  Job:  [id number]   cc:   Madolyn Frieze. [name], MD, [facility]  Rosalyn Gess. Norins, MD  Kalman Shan, MD  Lyndal Pulley. Chales Salmon, M.D.

## 2010-12-02 NOTE — Consult Note (Signed)
Ricardo Perkins, Ricardo Perkins NO.:  [id number]   MEDICAL RECORD NO.:  [id number]          PATIENT TYPE:  EMS   LOCATION:  MAJO                         FACILITY:  [facility]   PHYSICIAN:  Levert Feinstein, MD          DATE OF BIRTH:  1922-07-26   DATE OF CONSULTATION:  DATE OF DISCHARGE:  12/10/2008                                 CONSULTATION   CHIEF COMPLAINT:  Acute onset of vertigo.   HISTORY OF PRESENT ILLNESS:  The patient is an 77 year old gentleman  accompanied by his daughter, he has past medical history of paroxysmal  atrial fibrillation, on Coumadin, normal ventricular function, had a  previous Myoview on November 10, 2007, that was interpreted as normal by  cardiologist Dr. [name], also had a history of COPD, previous history  of tobacco abuse, home oxygen dependent for 30 months, hypertension.  He  is presenting with sudden onset of vertigo this morning at 8:20 a.m.   The patient has pending dental work this coming Thursday, has been off  Coumadin since Saturday, INR is 2.2, this morning he woke up at 8:20,  when he was trying to get out of the bed he experienced sudden vertigo,  described as left to right clockwise and short-lasting, improved by  holding still or staring at object in far distance, with associated  nausea, unsteady gait.  He actually fell, bruised his left wrist.   His symptoms gradually improved since he presented to the ER, now he is  back to baseline since 5 p.m. and denies lateralized motor or sensory  deficits.  No vertigo.  No nausea.   MRI of the brain without contrast.  MRA of the brain was normal.  Specifically, bilateral PCA was patent.  Basilar artery was patent.  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   REVIEW OF SYSTEMS:  Denies chest pain.  No gait difficulty.  No vertigo.   PAST MEDICAL HISTORY:  Hypertension, COPD, home oxygen dependent,  paroxysmal atrial fibrillation,  on Coumadin.   PAST SURGICAL HISTORY:  Appendectomy and dental procedure.   CURRENT MEDICATIONS:  1. Nifedipine 90 mg p.o. daily.  2. Atenolol 50 mg b.i.d.  3. Omeprazole 20 mg daily.  4. Jantoven 3 mg every night.  5. Flomax 0.4 mg daily.  6. Spiriva 18 mcg daily.  7. Home oxygen.  8. Ambien.   PHYSICAL EXAMINATION:  VITAL SIGNS:  Temperature 97.2, blood pressure  147/74, heart rate of 60s, in normal sinus rhythm; respirations of 19,  oxygen saturation 92% at room air.  CARDIAC:  Regular rate and rhythm.  NECK:  Supple.  No carotid bruits.  PULMONARY:  Clear to auscultation bilaterally.  NEUROLOGIC:  He is awake, alert, mild hard of hearing but no dysarthria,  no aphasia.  Cranial nerves II through XII:  Pupils were irregular,  status post bilateral cataracts, conjugate eye movement.  No nystagmus.  VOR reflexes were normal.  Facial sensation and strength was normal.  Uvula and tongue midline.  Head-turning and shoulder-shrugging were  normal and symmetric.  Tongue protrusion to cheek strength was normal.  Motor:  He has mild bilateral intrinsic hand muscle atrophy, mild finger  abduction, grip, abductor pollicis brevis weakness.  Sensory:  Normal to  vibratory sensation, light touch, and pinprick.  Deep tendon reflexes  brisk and symmetric.  Plantar responses were flexor.  Coordination:  Normal finger-to-nose, heel-to-shin.  Gait:  Cautious, mildly wide-  based, also complains of knee pain, mild difficulty with tendon but  almost back to baseline per patient as well.   MRI of the brain reviewed, there were no acute lesions, and the MRA,  anterior and posterior circulation was patent.   LABORATORY EVALUATION:  INR 2.2.  Troponin 0.02 and normal BMP with the  exception of mildly elevated creatinine of 1.3, glucose 126.  CBC was  normal.   ASSESSMENT:  An 77 year old male with vascular risk factors of  paroxysmal atrial fibrillation, being off Coumadin (though INR is still   therapeutic at 2.2) presenting with acute onset of vertigo,  differentiation diagnosis including benign positional vertigo versus  acute vestibular/brainstem/cerebellum ischemic event beyond resolution  of current MRI.   PLAN:  1. MRI is nonrevealing, the patient is asymptomatic now, it is okay to      discharge him home.  2. He can continue to hold off Coumadin as originally planned and go      on with his dental work.  3. However, he was advised to go to ER or contact my clinic as soon as      with recurrent symptoms.  4. He is to follow up with [facility] in 2-3 weeks      post discharge.      Levert Feinstein, MD  Electronically Signed     YY/MEDQ  D:  12/10/2008  T:  12/11/2008  Job:  [id number]

## 2010-12-03 ENCOUNTER — Ambulatory Visit (INDEPENDENT_AMBULATORY_CARE_PROVIDER_SITE_OTHER): Payer: Medicare Other | Admitting: *Deleted

## 2010-12-03 DIAGNOSIS — I4891 Unspecified atrial fibrillation: Secondary | ICD-10-CM

## 2010-12-13 ENCOUNTER — Emergency Department (INDEPENDENT_AMBULATORY_CARE_PROVIDER_SITE_OTHER): Payer: Medicare Other

## 2010-12-13 ENCOUNTER — Emergency Department (HOSPITAL_BASED_OUTPATIENT_CLINIC_OR_DEPARTMENT_OTHER): Payer: Medicare Other

## 2010-12-13 ENCOUNTER — Emergency Department (HOSPITAL_BASED_OUTPATIENT_CLINIC_OR_DEPARTMENT_OTHER)
Admission: EM | Admit: 2010-12-13 | Discharge: 2010-12-13 | Disposition: A | Discharge status: Home / Self Care | Payer: Medicare Other | Attending: Emergency Medicine | Admitting: Emergency Medicine

## 2010-12-13 DIAGNOSIS — K4020 Bilateral inguinal hernia, without obstruction or gangrene, not specified as recurrent: Secondary | ICD-10-CM

## 2010-12-13 DIAGNOSIS — I10 Essential (primary) hypertension: Secondary | ICD-10-CM | POA: Insufficient documentation

## 2010-12-13 DIAGNOSIS — N289 Disorder of kidney and ureter, unspecified: Secondary | ICD-10-CM | POA: Insufficient documentation

## 2010-12-13 DIAGNOSIS — R339 Retention of urine, unspecified: Secondary | ICD-10-CM | POA: Insufficient documentation

## 2010-12-13 DIAGNOSIS — R319 Hematuria, unspecified: Secondary | ICD-10-CM | POA: Insufficient documentation

## 2010-12-13 DIAGNOSIS — J449 Chronic obstructive pulmonary disease, unspecified: Secondary | ICD-10-CM | POA: Insufficient documentation

## 2010-12-13 DIAGNOSIS — Z79899 Other long term (current) drug therapy: Secondary | ICD-10-CM | POA: Insufficient documentation

## 2010-12-13 DIAGNOSIS — K4090 Unilateral inguinal hernia, without obstruction or gangrene, not specified as recurrent: Secondary | ICD-10-CM | POA: Insufficient documentation

## 2010-12-13 DIAGNOSIS — I4891 Unspecified atrial fibrillation: Secondary | ICD-10-CM | POA: Insufficient documentation

## 2010-12-13 DIAGNOSIS — J4489 Other specified chronic obstructive pulmonary disease: Secondary | ICD-10-CM | POA: Insufficient documentation

## 2010-12-13 DIAGNOSIS — R0602 Shortness of breath: Secondary | ICD-10-CM

## 2010-12-13 DIAGNOSIS — R109 Unspecified abdominal pain: Secondary | ICD-10-CM

## 2010-12-13 DIAGNOSIS — K5730 Diverticulosis of large intestine without perforation or abscess without bleeding: Secondary | ICD-10-CM

## 2010-12-13 LAB — DIFFERENTIAL
Basophils Absolute: 0 10*3/uL (ref 0.0–0.1)
Basophils Relative: 0 % (ref 0–1)
Eosinophils Absolute: 0 10*3/uL (ref 0.0–0.7)
Eosinophils Relative: 0 % (ref 0–5)
Lymphocytes Relative: 10 % — ABNORMAL LOW (ref 12–46)
Monocytes Absolute: 0.3 10*3/uL (ref 0.1–1.0)
Monocytes Relative: 5 % (ref 3–12)
Neutro Abs: 5.4 10*3/uL (ref 1.7–7.7)
Neutrophils Relative %: 84 % — ABNORMAL HIGH (ref 43–77)

## 2010-12-13 LAB — COMPREHENSIVE METABOLIC PANEL
Albumin: 4.3 g/dL (ref 3.5–5.2)
Alkaline Phosphatase: 73 U/L (ref 39–117)
BUN: 33 mg/dL — ABNORMAL HIGH (ref 6–23)
Creatinine, Ser: 1.5 mg/dL (ref 0.4–1.5)
Glucose, Bld: 133 mg/dL — ABNORMAL HIGH (ref 70–99)
Total Bilirubin: 0.5 mg/dL (ref 0.3–1.2)
Total Protein: 7.1 g/dL (ref 6.0–8.3)

## 2010-12-13 LAB — URINALYSIS, ROUTINE W REFLEX MICROSCOPIC
Glucose, UA: NEGATIVE mg/dL
Ketones, ur: NEGATIVE mg/dL
Protein, ur: NEGATIVE mg/dL
Urobilinogen, UA: 0.2 mg/dL (ref 0.0–1.0)

## 2010-12-13 LAB — URINE MICROSCOPIC-ADD ON

## 2010-12-13 LAB — CBC
MCH: 30.4 pg (ref 26.0–34.0)
MCHC: 35.3 g/dL (ref 30.0–36.0)
RDW: 14.8 % (ref 11.5–15.5)

## 2010-12-13 LAB — PROTIME-INR
INR: 2.3 — ABNORMAL HIGH (ref 0.00–1.49)
Prothrombin Time: 25.4 seconds — ABNORMAL HIGH (ref 11.6–15.2)

## 2010-12-13 LAB — LIPASE, BLOOD: Lipase: 22 U/L (ref 11–59)

## 2010-12-16 ENCOUNTER — Encounter: Payer: Self-pay | Admitting: Internal Medicine

## 2010-12-16 ENCOUNTER — Emergency Department (HOSPITAL_BASED_OUTPATIENT_CLINIC_OR_DEPARTMENT_OTHER)
Admission: EM | Admit: 2010-12-16 | Discharge: 2010-12-16 | Disposition: A | Discharge status: Home / Self Care | Payer: Medicare Other | Attending: Emergency Medicine | Admitting: Emergency Medicine

## 2010-12-16 DIAGNOSIS — I10 Essential (primary) hypertension: Secondary | ICD-10-CM | POA: Insufficient documentation

## 2010-12-16 DIAGNOSIS — Z466 Encounter for fitting and adjustment of urinary device: Secondary | ICD-10-CM | POA: Insufficient documentation

## 2010-12-16 DIAGNOSIS — Z79899 Other long term (current) drug therapy: Secondary | ICD-10-CM | POA: Insufficient documentation

## 2010-12-16 DIAGNOSIS — I4891 Unspecified atrial fibrillation: Secondary | ICD-10-CM | POA: Insufficient documentation

## 2010-12-16 DIAGNOSIS — J449 Chronic obstructive pulmonary disease, unspecified: Secondary | ICD-10-CM | POA: Insufficient documentation

## 2010-12-16 DIAGNOSIS — J4489 Other specified chronic obstructive pulmonary disease: Secondary | ICD-10-CM | POA: Insufficient documentation

## 2010-12-17 ENCOUNTER — Ambulatory Visit: Payer: Medicare Other | Admitting: Internal Medicine

## 2010-12-20 ENCOUNTER — Other Ambulatory Visit: Payer: Self-pay | Admitting: Cardiology

## 2010-12-31 ENCOUNTER — Ambulatory Visit (INDEPENDENT_AMBULATORY_CARE_PROVIDER_SITE_OTHER): Payer: Medicare Other | Admitting: *Deleted

## 2010-12-31 DIAGNOSIS — I4891 Unspecified atrial fibrillation: Secondary | ICD-10-CM

## 2011-01-12 ENCOUNTER — Emergency Department (HOSPITAL_COMMUNITY)
Admission: EM | Admit: 2011-01-12 | Discharge: 2011-01-12 | Disposition: A | Discharge status: Home / Self Care | Payer: Medicare Other | Attending: Emergency Medicine | Admitting: Emergency Medicine

## 2011-01-12 ENCOUNTER — Encounter (INDEPENDENT_AMBULATORY_CARE_PROVIDER_SITE_OTHER): Payer: Self-pay | Admitting: Surgery

## 2011-01-12 DIAGNOSIS — Z7901 Long term (current) use of anticoagulants: Secondary | ICD-10-CM | POA: Insufficient documentation

## 2011-01-12 DIAGNOSIS — I4891 Unspecified atrial fibrillation: Secondary | ICD-10-CM | POA: Insufficient documentation

## 2011-01-12 DIAGNOSIS — Z9981 Dependence on supplemental oxygen: Secondary | ICD-10-CM | POA: Insufficient documentation

## 2011-01-12 DIAGNOSIS — J4489 Other specified chronic obstructive pulmonary disease: Secondary | ICD-10-CM | POA: Insufficient documentation

## 2011-01-12 DIAGNOSIS — J449 Chronic obstructive pulmonary disease, unspecified: Secondary | ICD-10-CM | POA: Insufficient documentation

## 2011-01-12 DIAGNOSIS — K4090 Unilateral inguinal hernia, without obstruction or gangrene, not specified as recurrent: Secondary | ICD-10-CM | POA: Insufficient documentation

## 2011-01-12 DIAGNOSIS — I10 Essential (primary) hypertension: Secondary | ICD-10-CM | POA: Insufficient documentation

## 2011-01-12 LAB — URINE MICROSCOPIC-ADD ON

## 2011-01-12 LAB — URINALYSIS, ROUTINE W REFLEX MICROSCOPIC
Bilirubin Urine: NEGATIVE
Glucose, UA: NEGATIVE mg/dL
Hgb urine dipstick: NEGATIVE
Protein, ur: NEGATIVE mg/dL

## 2011-01-15 ENCOUNTER — Ambulatory Visit (INDEPENDENT_AMBULATORY_CARE_PROVIDER_SITE_OTHER): Payer: Medicare Other | Admitting: Surgery

## 2011-01-15 ENCOUNTER — Other Ambulatory Visit: Payer: Self-pay | Admitting: Cardiology

## 2011-01-15 ENCOUNTER — Encounter: Payer: Self-pay | Admitting: Internal Medicine

## 2011-01-15 ENCOUNTER — Encounter (INDEPENDENT_AMBULATORY_CARE_PROVIDER_SITE_OTHER): Payer: Self-pay | Admitting: Surgery

## 2011-01-15 VITALS — BP 116/62 | HR 73 | Temp 97.8°F | Ht 67.0 in | Wt 182.8 lb

## 2011-01-15 DIAGNOSIS — K4020 Bilateral inguinal hernia, without obstruction or gangrene, not specified as recurrent: Secondary | ICD-10-CM

## 2011-01-15 HISTORY — DX: Bilateral inguinal hernia, without obstruction or gangrene, not specified as recurrent: K40.20

## 2011-01-15 NOTE — Progress Notes (Signed)
Subjective:     Patient ID: Ricardo Perkins, male   DOB: [birth year], 77 y.o.   MRN: [id number]    BP 116/62  Pulse 73  Temp(Src) 97.8 F (36.6 C) (Temporal)  Ht 5\' 7"  (1.702 m)  Wt 182 lb 12.8 oz (82.918 kg)  BMI 28.63 kg/m2    HPI A patient is a 77 year old male with a long history of known bilateral inguinal hernias. He states that he was in his mid 67s when he was first diagnosed. He was always told not to have this fixed unless they bothered him. These have become fairly large. Recently he had a visit to the emergency department because of his right groin had become painful. The hernia was reduced at that time. He denies any left sided symptoms. She had a CT scan in May of 2012 which showed a right inguinal hernia containing small bowel with no sign of obstruction. The left inguinal hernia containing sigmoid colon without obstruction. He also has extensive sigmoid and descending colon diverticulosis with no sign of diverticulitis. He presents now for surgical evaluation.  The patient denies any recent obstructive symptoms. Occasionally he does have some difficulty with urination but he does have benign prostatic hypertrophy. His urologist is Dr. Eliezer Champagne at [facility]. His cardiologist is Dr. Olga Millers with [facility]  Review of Systems Positive for dentures.  Positive for hypertension, COPD requiring home oxygen, hearing loss requiring hearing aids. Otherwise negative.    Objective:   Physical Exam Elderly male in no apparent distress HEENT: EOMI, sclera anicteric Neck: No masses, no thyromegaly Lungs: Coarse breath sounds, normal respiratory effort Heart: Regular rate and rhythm, no murmurs Abdomen: Soft, nontender, no umbilical hernia, no palpable masses GU: Bilateral descended testes, no testicular masses, large reducible bilateral inguinal hernias containing bowel. Extremities: No edema Skin: Warm and dry with no sign of jaundice     Assessment:   Large chronic  bilateral inguinal hernias containing bowel Chronic atrial fibrillation requiring anticoagulation    Plan:     Will obtain cardiac clearance from Dr. [name].  The patient will need his anticoagulation held for 5 days prior to surgery.  Recommend open bilateral inguinal hernia repairs with mesh.  The surgical procedure has been discussed with the patient.  Potential risks, benefits, alternative treatments, and expected outcomes have been explained.  All of the patient's questions at this time have been answered.  The patient understand the proposed surgical procedure and wishes to proceed.  Due to the patient's comorbidities, we will plan on keeping him at least overnight.

## 2011-01-15 NOTE — Patient Instructions (Signed)
Will await cardiac clearance from Dr. [name].  Will schedule for open bilateral inguinal hernia repairs with mesh.  Will need 1-2 days of hospitalization post-op.

## 2011-01-18 HISTORY — PX: HERNIA REPAIR: SHX51

## 2011-01-19 ENCOUNTER — Encounter: Payer: Medicare Other | Admitting: Cardiology

## 2011-01-22 ENCOUNTER — Encounter (HOSPITAL_COMMUNITY)
Admission: RE | Admit: 2011-01-22 | Discharge: 2011-01-22 | Disposition: A | Discharge status: Home / Self Care | Payer: Medicare Other | Source: Ambulatory Visit | Attending: Surgery | Admitting: Surgery

## 2011-01-22 LAB — CBC
HCT: 46.2 % (ref 39.0–52.0)
MCHC: 34.8 g/dL (ref 30.0–36.0)
MCV: 90.1 fL (ref 78.0–100.0)
RDW: 14.4 % (ref 11.5–15.5)

## 2011-01-22 LAB — BASIC METABOLIC PANEL
BUN: 24 mg/dL — ABNORMAL HIGH (ref 6–23)
Creatinine, Ser: 1.19 mg/dL (ref 0.50–1.35)
GFR calc Af Amer: >60 mL/min (ref >60)
GFR calc non Af Amer: 58 mL/min — ABNORMAL LOW (ref >60)
Potassium: 4.4 mEq/L (ref 3.5–5.1)

## 2011-01-22 LAB — APTT: aPTT: 41 seconds — ABNORMAL HIGH (ref 24–37)

## 2011-01-23 ENCOUNTER — Telehealth (INDEPENDENT_AMBULATORY_CARE_PROVIDER_SITE_OTHER): Payer: Self-pay | Admitting: General Surgery

## 2011-01-23 NOTE — Telephone Encounter (Signed)
Message copied by Wilder Glade on Fri Jan 23, 2011  8:46 AM ------      Message from: Manus Rudd K      Created: Thu Jan 22, 2011 10:21 PM       OK for Surgery

## 2011-01-23 NOTE — Telephone Encounter (Signed)
Called pre admit at [facility]..to let them know that pt is ok for surgery.Pattricia Boss...01-23-11

## 2011-01-26 ENCOUNTER — Encounter: Payer: Self-pay | Admitting: Cardiology

## 2011-01-26 ENCOUNTER — Inpatient Hospital Stay (HOSPITAL_COMMUNITY)
Admission: RE | Admit: 2011-01-26 | Discharge: 2011-01-28 | DRG: 352 | Disposition: A | Discharge status: Home / Self Care | Payer: Medicare Other | Source: Ambulatory Visit | Attending: Surgery | Admitting: Surgery

## 2011-01-26 DIAGNOSIS — Z01812 Encounter for preprocedural laboratory examination: Secondary | ICD-10-CM

## 2011-01-26 DIAGNOSIS — I10 Essential (primary) hypertension: Secondary | ICD-10-CM | POA: Diagnosis present

## 2011-01-26 DIAGNOSIS — I4891 Unspecified atrial fibrillation: Secondary | ICD-10-CM | POA: Diagnosis present

## 2011-01-26 DIAGNOSIS — K4020 Bilateral inguinal hernia, without obstruction or gangrene, not specified as recurrent: Secondary | ICD-10-CM

## 2011-01-26 DIAGNOSIS — Z9981 Dependence on supplemental oxygen: Secondary | ICD-10-CM

## 2011-01-26 DIAGNOSIS — Z7901 Long term (current) use of anticoagulants: Secondary | ICD-10-CM

## 2011-01-26 DIAGNOSIS — K4021 Bilateral inguinal hernia, without obstruction or gangrene, recurrent: Principal | ICD-10-CM | POA: Diagnosis present

## 2011-01-26 DIAGNOSIS — J449 Chronic obstructive pulmonary disease, unspecified: Secondary | ICD-10-CM | POA: Diagnosis present

## 2011-01-26 DIAGNOSIS — J4489 Other specified chronic obstructive pulmonary disease: Secondary | ICD-10-CM | POA: Diagnosis present

## 2011-01-28 ENCOUNTER — Encounter: Payer: Medicare Other | Admitting: *Deleted

## 2011-01-28 NOTE — Op Note (Signed)
NAMECALVEN, GILKES NO.:  [id number]  MEDICAL RECORD NO.:  [id number]  LOCATION:  4736                         FACILITY:  [facility]  PHYSICIAN:  Wilmon Arms. Corliss Skains, M.D. DATE OF BIRTH:  01-Aug-1922  DATE OF PROCEDURE:  01/26/2011 DATE OF DISCHARGE:                              OPERATIVE REPORT   PREOPERATIVE DIAGNOSIS:  Bilateral inguinal hernias.  POSTOPERATIVE DIAGNOSIS:  Bilateral inguinal hernias.  PROCEDURE:  Open bilateral inguinal hernia repairs with mesh.  SURGEON:  Wilmon Arms. Kainoa Swoboda, MD  ANESTHESIA:  Spinal.  INDICATIONS:  This is an 77 year old male who has had a very long history of known bilateral inguinal hernias.  These have become quite large and he has become symptomatic in his right groin.  A CT scan of the abdomen was obtained prior to his referral which showed a right inguinal hernia containing small bowel with no sign of obstruction.  A left inguinal hernia contained sigmoid colon with no sign of obstruction.  He presents now for surgical repair.  DESCRIPTION OF PROCEDURE:  The patient was brought to the operating room where a spinal anesthetic was placed.  The patient's groins were then shaved, prepped with ChloraPrep and Betadine, and draped in a sterile fashion.  A time-out was taken to assure the proper patient and proper procedure.  We began on his right side.  We made an oblique incision above the inguinal ligament.  Dissection was carried down to the subcutaneous tissues with cautery.  We dissected down to the external oblique fascia.  The fascia was opened along direction of its fibers down to the external ring.  The patient has a very large direct hernia. We dissected the direct hernia free from the surrounding tissue and reduced this back in the preperitoneal space.  This was held in place with a sponge stick.  We ran the floor of the inguinal canal shut with a 0 Vicryl.  This held the bulge in place.  We then skeletonized  the spermatic cord.  A very small indirect hernia sac was reduced up through the internal ring.  We then cut a 6 x 6 inch piece of Ultrapro mesh into a keyhole shape.  We secured this with 2-0 Prolene beginning at the pubic tubercle.  We ran this along the shelving edge inferiorly and the internal oblique fascia superiorly.  The tails of mesh were sutured together behind the spermatic cord.  We then closed the external oblique fascia with a 2-0 Vicryl.  We infiltrated 20 mL of dilute 1:1 Exparel in the subcutaneous tissues.  3-0 Vicryl was used to close subcutaneous tissues and 4-0 Monocryl was used to close the skin.  We then turned our attention to the left side.  We made a similar incision and dissected down to the fascia.  The fascia was opened and we encountered a direct hernia.  We reduced the direct hernia and closed the floor of the inguinal canal with a 0 Vicryl.  We cut a keyhole mesh from the remainder of the Ultrapro.  There was no sign of any indirect hernia on the left side.  We secured the mesh with 2-0 Prolene beginning at the pubic tubercle.  We ran this along the shelving edge inferiorly and the internal oblique fascia superiorly.  The tails of the mesh were sutured together and tucked underneath the external oblique fascia laterally. We closed the fascia with 2-0 Vicryl.  We infiltrated thoroughly with the remainder of the Exparel.  3-0 Vicryl was used to close the subcutaneous tissues and 4-0 Monocryl was used to close the skin.  Steri-Strips and clean dressings were applied.  The patient was then extubated and brought to the recovery room in stable condition.  All sponge, instrument, and needle counts were correct.     Wilmon Arms. Corliss Skains, M.D.     MKT/MEDQ  D:  01/26/2011  T:  01/27/2011  Job:  [id number]  cc:   Rosalyn Gess. Norins, MD  Electronically Signed by Manus Rudd M.D. on 01/28/2011 01:37:33 PM

## 2011-01-30 ENCOUNTER — Telehealth: Payer: Self-pay | Admitting: Cardiology

## 2011-01-30 NOTE — Telephone Encounter (Signed)
Patient daughter Lynden Ang wanted to know when he should make an appt for the coumadin clinic prior to discharge from hospital.

## 2011-01-30 NOTE — Telephone Encounter (Signed)
Called spoke with pt's daughter, pt resumed Coumadin 01/28/11 made f/u appt in clinic for 02/06/11.

## 2011-02-06 ENCOUNTER — Ambulatory Visit (INDEPENDENT_AMBULATORY_CARE_PROVIDER_SITE_OTHER): Payer: Medicare Other | Admitting: *Deleted

## 2011-02-06 DIAGNOSIS — I4891 Unspecified atrial fibrillation: Secondary | ICD-10-CM

## 2011-02-11 ENCOUNTER — Telehealth (INDEPENDENT_AMBULATORY_CARE_PROVIDER_SITE_OTHER): Payer: Self-pay | Admitting: Surgery

## 2011-02-11 DIAGNOSIS — R52 Pain, unspecified: Secondary | ICD-10-CM

## 2011-02-11 MED ORDER — HYDROCODONE-ACETAMINOPHEN 5-500 MG PO TABS: 1.0000 | ORAL_TABLET | ORAL | Status: DC | PRN

## 2011-02-11 NOTE — Telephone Encounter (Signed)
Called in pt rx for vicodin 5/500 1 tab every 4 hrs prn for pain #20  Pt is aware of his rx being called in and aware about his appt in 02/18/11  [facility] 02-11-11 @ 10:54

## 2011-02-16 ENCOUNTER — Encounter: Payer: Medicare Other | Admitting: Cardiology

## 2011-02-16 ENCOUNTER — Encounter (INDEPENDENT_AMBULATORY_CARE_PROVIDER_SITE_OTHER): Payer: Self-pay | Admitting: Surgery

## 2011-02-18 ENCOUNTER — Ambulatory Visit (INDEPENDENT_AMBULATORY_CARE_PROVIDER_SITE_OTHER): Payer: Medicare Other | Admitting: *Deleted

## 2011-02-18 ENCOUNTER — Encounter (INDEPENDENT_AMBULATORY_CARE_PROVIDER_SITE_OTHER): Payer: Self-pay | Admitting: Surgery

## 2011-02-18 ENCOUNTER — Ambulatory Visit (INDEPENDENT_AMBULATORY_CARE_PROVIDER_SITE_OTHER): Payer: Medicare Other | Admitting: Surgery

## 2011-02-18 DIAGNOSIS — K4020 Bilateral inguinal hernia, without obstruction or gangrene, not specified as recurrent: Secondary | ICD-10-CM

## 2011-02-18 DIAGNOSIS — I4891 Unspecified atrial fibrillation: Secondary | ICD-10-CM

## 2011-02-18 NOTE — Progress Notes (Signed)
Status post open bilateral inguinal hernia repairs with mesh on July 9. He had large direct hernias on both sides. Both of these were repaired with Ultrapro mesh. The patient did very well after surgery. Minimal pain. Minimal swelling. His main complaint is some sensitivity and tingling on the lateral side of his right thigh. On examination his incisions are well healed with no sign of infection. No sign of recurrent hernia. He has minimal swelling on either side. No testicular tenderness. He has some fake hypersensitivity at the lateral right thigh near his hip. It is possible that some of these sensory nerves were disrupted or were incorporated into the mesh closure. As these nerves heal, this sensitivity should resolve. I encouraged him to use some nonsteroidal anti-inflammatory medications to help sensitivity. He may begin increasing his level of activity. Followup on a p.r.n. basis.

## 2011-03-18 ENCOUNTER — Ambulatory Visit (INDEPENDENT_AMBULATORY_CARE_PROVIDER_SITE_OTHER): Payer: Medicare Other | Admitting: *Deleted

## 2011-03-18 DIAGNOSIS — I4891 Unspecified atrial fibrillation: Secondary | ICD-10-CM

## 2011-04-15 ENCOUNTER — Ambulatory Visit (INDEPENDENT_AMBULATORY_CARE_PROVIDER_SITE_OTHER): Payer: Medicare Other | Admitting: *Deleted

## 2011-04-15 DIAGNOSIS — I4891 Unspecified atrial fibrillation: Secondary | ICD-10-CM

## 2011-04-21 LAB — BASIC METABOLIC PANEL
CO2: 27
Chloride: 110
GFR calc Af Amer: 53 — ABNORMAL LOW
Sodium: 144

## 2011-04-21 LAB — CBC
Hemoglobin: 14.1
MCHC: 33
MCV: 93.2
RBC: 4.59

## 2011-04-21 LAB — PROTIME-INR: INR: 1.3

## 2011-04-25 ENCOUNTER — Other Ambulatory Visit: Payer: Self-pay | Admitting: Internal Medicine

## 2011-04-30 ENCOUNTER — Ambulatory Visit (INDEPENDENT_AMBULATORY_CARE_PROVIDER_SITE_OTHER): Payer: Medicare Other | Admitting: Internal Medicine

## 2011-04-30 VITALS — BP 118/68 | HR 53 | Temp 97.7°F | Wt 185.0 lb

## 2011-04-30 DIAGNOSIS — I951 Orthostatic hypotension: Secondary | ICD-10-CM

## 2011-04-30 MED ORDER — TEMAZEPAM 30 MG PO CAPS: 30.0000 mg | ORAL_CAPSULE | Freq: Every evening | ORAL | Status: DC | PRN

## 2011-04-30 NOTE — Telephone Encounter (Signed)
Per [facility] @ pharmacy, Rx was not received, verbal given and daughter advised.

## 2011-04-30 NOTE — Patient Instructions (Signed)
Dizziness - seems to be orthostatic hypotension or positional vertigo. This is a  Benign condition. The treatment is knowledge of the problem and the rule of 20 - when you chagne positon, laying to sitting and sitting to standing you need to wait for things to calm down before you walk off.      Orthostatic Hypotension Orthostatic hypotension is a sudden fall in blood pressure. It occurs when a person goes from a sitting or lying position to a standing position. SYMPTOMS  Lightheadedness or dizziness.   Fainting or near-fainting.   A fast heart rate (tachycardia).   Weakness.   Feeling tired (fatigue).  CAUSES  Loss of body fluids (dehydration).   Medicines that lower blood pressure.   Sudden changes in posture, such as sudden standing when you have been sitting or lying down.   Taking too much of your medicine.  DIAGNOSIS Your caregiver may find the cause of orthostatic hypotension through:  A history and/or physical exam.   Checking your blood pressure. Your caregiver will check your blood pressure when you are:   Lying down.   Sitting.   Standing.   Tilt table testing. In this test, you are placed on a table that goes from a lying position to a standing position. You will be strapped to the table. This test helps to monitor your blood pressure and heart rate when you are in different positions.  TREATMENT  If orthostatic hypotension is caused by your medicines, your caregiver will need to adjust your dosage. Do not stop or adjust your medicine on your own.   When changing positions, make these changes slowly. This allows your body to adjust to the different position.   Compression stockings that are worn on your lower legs may be helpful.   Your caregiver may have you consume extra salt. Do not add extra salt to your diet unless directed by your caregiver.   Eat frequent, small meals. Avoid sudden standing after eating.   Avoid hot showers or excessive heat.     Your caregiver may give you fluids through the vein (intravenous).   Your caregiver may put you on medicine to help enhance fluid retention.  SEEK IMMEDIATE MEDICAL CARE IF:  You faint or have a near-fainting episode. Call your local emergency services   (911 in [place]   You have or develop chest pain.   You feel sick to your stomach (nauseous) or vomit.   You have a loss of feeling or movement in your arms or legs.   You have difficulty talking, slurred speech, or you are unable to talk.   You have difficulty thinking or have confused thinking.  MAKE SURE YOU:  Understand these instructions.   Will watch your condition.   Will get help right away if you are not doing well or get worse.  Document Released: 06/26/2002 Document Re-Released: 09/30/2009 [facility] Patient Information 2011 [place], [place].

## 2011-05-03 NOTE — Progress Notes (Signed)
  Subjective:    Patient ID: Ricardo Perkins, male    DOB: Jun 28, 1923, 77 y.o.   MRN: [id number]  HPI Mr. Altier presents with c/o feeling light-headed and unsteady on his feet. He has had no focal weakness, no falls, no paresthesia, no c/p, no SOB. He has no h/o head trauma. He does admit to feeling light-headed with position change.  He also has some mild dysequilibrium.   I have reviewed the patient's medical history in detail and updated the computerized patient record.    Review of Systems System review is negative for any constitutional, cardiac, pulmonary, GI or neuro symptoms or complaints other than as described in the HPI.     Objective:   Physical Exam Orthostatics: supine  140/84  HR 60; sitting 130/82 HR 60; standing 120/78 HR 60 Gen'l - older white male in no distress HEENT [place]/AT, C&S clear Cor - RRR Pulm - normal respirations Neuro - A&O x 3, CN II-XII grossly intact, MS 4/5 and equal, cerebellar function - no tremor, no pronator drift, mild ataxia.       Assessment & Plan:  1. Orthostatic hypotension - no falls or severe dizzines  Plan - "rule of 20" with position change.

## 2011-05-05 ENCOUNTER — Encounter (INDEPENDENT_AMBULATORY_CARE_PROVIDER_SITE_OTHER): Payer: Self-pay | Admitting: Surgery

## 2011-05-08 ENCOUNTER — Encounter (INDEPENDENT_AMBULATORY_CARE_PROVIDER_SITE_OTHER): Payer: Self-pay | Admitting: Surgery

## 2011-05-13 ENCOUNTER — Ambulatory Visit (INDEPENDENT_AMBULATORY_CARE_PROVIDER_SITE_OTHER): Payer: Medicare Other | Admitting: *Deleted

## 2011-05-13 DIAGNOSIS — I4891 Unspecified atrial fibrillation: Secondary | ICD-10-CM

## 2011-05-14 ENCOUNTER — Encounter (INDEPENDENT_AMBULATORY_CARE_PROVIDER_SITE_OTHER): Payer: Self-pay | Admitting: Surgery

## 2011-05-20 ENCOUNTER — Encounter (INDEPENDENT_AMBULATORY_CARE_PROVIDER_SITE_OTHER): Payer: Medicare Other | Admitting: Surgery

## 2011-05-27 ENCOUNTER — Encounter: Payer: Self-pay | Admitting: Cardiology

## 2011-06-10 ENCOUNTER — Ambulatory Visit (INDEPENDENT_AMBULATORY_CARE_PROVIDER_SITE_OTHER): Payer: Medicare Other | Admitting: *Deleted

## 2011-06-10 DIAGNOSIS — I4891 Unspecified atrial fibrillation: Secondary | ICD-10-CM

## 2011-06-10 LAB — POCT INR: INR: 2.6

## 2011-06-26 ENCOUNTER — Other Ambulatory Visit: Payer: Self-pay | Admitting: Pharmacist

## 2011-06-26 MED ORDER — WARFARIN SODIUM 3 MG PO TABS: 3.0000 mg | ORAL_TABLET | Freq: Every day | ORAL | Status: DC

## 2011-07-22 ENCOUNTER — Ambulatory Visit (INDEPENDENT_AMBULATORY_CARE_PROVIDER_SITE_OTHER): Payer: Medicare Other | Admitting: *Deleted

## 2011-07-22 DIAGNOSIS — I4891 Unspecified atrial fibrillation: Secondary | ICD-10-CM | POA: Diagnosis not present

## 2011-07-22 LAB — POCT INR: INR: 3.3

## 2011-07-24 ENCOUNTER — Other Ambulatory Visit: Payer: Self-pay | Admitting: *Deleted

## 2011-07-24 MED ORDER — OMEPRAZOLE 20 MG PO CPDR: 20.0000 mg | DELAYED_RELEASE_CAPSULE | Freq: Every day | ORAL | Status: DC

## 2011-08-19 ENCOUNTER — Ambulatory Visit (INDEPENDENT_AMBULATORY_CARE_PROVIDER_SITE_OTHER): Payer: Medicare Other | Admitting: *Deleted

## 2011-08-19 DIAGNOSIS — I4891 Unspecified atrial fibrillation: Secondary | ICD-10-CM

## 2011-08-25 DIAGNOSIS — M1710 Unilateral primary osteoarthritis, unspecified knee: Secondary | ICD-10-CM | POA: Diagnosis not present

## 2011-08-26 ENCOUNTER — Telehealth: Payer: Self-pay | Admitting: Cardiology

## 2011-08-26 NOTE — Telephone Encounter (Signed)
Left message for dtr to call, can schedule pt to see scott weaver on a day when crenshaw is in the office.

## 2011-08-26 NOTE — Telephone Encounter (Signed)
New Problem   Patient daughter Chip Boer calling to get patient worked in for surgical clearance for 09/23/11 surgery with Dr. Berton Lan.   ROV scheduled on 3/7, first available with Crenshaw 3/5.  Please call patient daughter to advise

## 2011-08-27 ENCOUNTER — Other Ambulatory Visit: Payer: Self-pay | Admitting: Orthopedic Surgery

## 2011-09-01 NOTE — Telephone Encounter (Signed)
Spoke with pt dtr, appt scheduled with pac.

## 2011-09-07 ENCOUNTER — Ambulatory Visit: Payer: Medicare Other | Admitting: Physician Assistant

## 2011-09-11 ENCOUNTER — Encounter: Payer: Self-pay | Admitting: Physician Assistant

## 2011-09-11 ENCOUNTER — Ambulatory Visit (INDEPENDENT_AMBULATORY_CARE_PROVIDER_SITE_OTHER): Payer: Medicare Other | Admitting: Physician Assistant

## 2011-09-11 DIAGNOSIS — I10 Essential (primary) hypertension: Secondary | ICD-10-CM | POA: Diagnosis not present

## 2011-09-11 DIAGNOSIS — I4891 Unspecified atrial fibrillation: Secondary | ICD-10-CM | POA: Diagnosis not present

## 2011-09-11 DIAGNOSIS — Z01810 Encounter for preprocedural cardiovascular examination: Secondary | ICD-10-CM | POA: Insufficient documentation

## 2011-09-11 DIAGNOSIS — J449 Chronic obstructive pulmonary disease, unspecified: Secondary | ICD-10-CM

## 2011-09-11 DIAGNOSIS — M179 Osteoarthritis of knee, unspecified: Secondary | ICD-10-CM | POA: Insufficient documentation

## 2011-09-11 DIAGNOSIS — M1710 Unilateral primary osteoarthritis, unspecified knee: Secondary | ICD-10-CM | POA: Insufficient documentation

## 2011-09-11 NOTE — Assessment & Plan Note (Signed)
Management per pulmonary. 

## 2011-09-11 NOTE — Patient Instructions (Signed)
Your physician recommends that you schedule a follow-up appointment in: 6 months with Dr [name] Your physician has requested that you have a lexiscan myoview. For further information please visit [email or web address] Please follow instruction sheet, as given.

## 2011-09-11 NOTE — Assessment & Plan Note (Signed)
He has significant risk factors for CAD.  His last stress test was over 3 years ago.  He cannot achieve 4 METs.  I will arrange a Lexiscan Myoview.  If this is low risk, he may proceed with surgery at acceptable risk.  Beta blockers should be continued in the perioperative period to reduce the risk of CV complications.  Our service is available as necessary.

## 2011-09-11 NOTE — Progress Notes (Signed)
[address] [place], [place]  [address] Phone: [phone] Fax:  [phone]  Date:  09/11/2011   Name:  Ricardo Perkins       DOB:  [birth year] MRN:  [id number]  PCP:  Dr. Debby Bud Primary Cardiologist:  Dr. Olga Millers  Primary Electrophysiologist:  None    History of Present Illness: Ricardo Perkins is a 78 y.o. male who presents for surgical clearance.  He has a history of paroxysmal atrial fibrillation. His LV function is normal. He has also had a previous Myoview on November 10, 2007, that was interpreted as a possible small area of lateral ischemia at the apex, but Dr. Olga Millers reviewed this, and in fact, felt it was most likely normal. His TSH has been normal.  He needs a right TKR with Dr. Lequita Halt in 11/2011.  He is quite limited by his knee.  He cannot achieve 4 METs.  He denies chest pain.  He does get SOB at times with more extreme activities.  He denies orthopnea, PND.  He sleeps with O2 at night and is followed by pulmonary for his COPD.  He denies syncope or palpitations.  He has no h/o CVA.  He has been taken off of coumadin in the past for procedures without bridging therapy.    Past Medical History  Diagnosis Date  . INSOMNIA, PERSISTENT 08/22/2008  . DECREASED HEARING 10/26/2007  . HYPERTENSION 07/23/2008  . BRADYCARDIA 04/01/2009  . C O P D 10/26/2007  . RENAL INSUFFICIENCY 04/01/2009  . BENIGN PROSTATIC HYPERTROPHY, WITH OBSTRUCTION 11/21/2007  . ASTEATOTIC ECZEMA 03/29/2008  . INSOMNIA UNSPECIFIED 11/21/2007  . LEG EDEMA, BILATERAL 01/07/2010  . PAROXYSMAL ATRIAL FIBRILLATION 11/21/2007    Current Outpatient Prescriptions  Medication Sig Dispense Refill  . amLODipine (NORVASC) 5 MG tablet take 1 tablet by mouth once daily  90 tablet  4  . atenolol (TENORMIN) 50 MG tablet take 1/2 tablet by mouth twice a day  90 tablet  3  . Cholecalciferol (VITAMIN D3) 1000 UNITS CAPS Take by mouth daily.        . finasteride (PROSCAR) 5 MG tablet Take 5 mg by  mouth daily.        Marland Kitchen KRILL OIL OMEGA-3 PO Take by mouth daily.      Marland Kitchen omeprazole (PRILOSEC) 20 MG capsule Take 1 capsule (20 mg total) by mouth daily.  90 capsule  2  . Tamsulosin HCl (FLOMAX) 0.4 MG CAPS Take by mouth 2 (two) times daily.        . temazepam (RESTORIL) 30 MG capsule Take 1 capsule (30 mg total) by mouth at bedtime as needed for sleep.  30 capsule  5  . warfarin (COUMADIN) 3 MG tablet Take 1 tablet (3 mg total) by mouth daily.  90 tablet  1    Allergies: No Known Allergies  History  Substance Use Topics  . Smoking status: Former Games developer  . Smokeless tobacco: Former Neurosurgeon   Comment: History of very heavy smoking.  Quite about 4 years ago.  . Alcohol Use: No     ROS:  Please see the history of present illness.   All other systems reviewed and negative.   PHYSICAL EXAM: VS:  BP 134/67  Pulse 55  Ht 5\' 7"  (1.702 m)  Wt 189 lb (85.73 kg)  BMI 29.60 kg/m2 Well nourished, well developed, in no acute distress HEENT: normal Neck: no JVD Vascular: no carotid bruits Cardiac:  normal S1, S2; RRR; no  murmur Lungs:  Decreased breath sounds bilaterally, no wheezing, rhonchi or rales Abd: soft, nontender, no hepatomegaly Ext: trace ankle edema Skin: warm and dry Neuro:  CNs 2-12 intact, no focal abnormalities noted  EKG:  Sinus brady, HR 52, LAD, inf Q waves, ant Q waves, PVCs, NSSTTW changes, no chang from prior   ASSESSMENT AND PLAN:

## 2011-09-11 NOTE — Assessment & Plan Note (Signed)
Controlled.  

## 2011-09-11 NOTE — Assessment & Plan Note (Signed)
Maintaining NSR.  He remains on coumadin.  He has no h/o known stroke.  He has stopped coumadin in the past without problems.  He may stop the coumadin 5 days prior to surgery and restart after surgery when felt to be safe.

## 2011-09-11 NOTE — Assessment & Plan Note (Signed)
TKR pending with Dr. Lequita Halt.

## 2011-09-16 ENCOUNTER — Ambulatory Visit (INDEPENDENT_AMBULATORY_CARE_PROVIDER_SITE_OTHER): Payer: Medicare Other | Admitting: *Deleted

## 2011-09-16 DIAGNOSIS — I4891 Unspecified atrial fibrillation: Secondary | ICD-10-CM

## 2011-09-22 ENCOUNTER — Ambulatory Visit (HOSPITAL_COMMUNITY): Payer: Medicare Other | Attending: Physician Assistant | Admitting: Radiology

## 2011-09-22 VITALS — BP 128/74 | Ht 67.0 in | Wt 188.0 lb

## 2011-09-22 DIAGNOSIS — R0602 Shortness of breath: Secondary | ICD-10-CM | POA: Diagnosis not present

## 2011-09-22 DIAGNOSIS — I4891 Unspecified atrial fibrillation: Secondary | ICD-10-CM | POA: Insufficient documentation

## 2011-09-22 DIAGNOSIS — I10 Essential (primary) hypertension: Secondary | ICD-10-CM | POA: Insufficient documentation

## 2011-09-22 DIAGNOSIS — Z8249 Family history of ischemic heart disease and other diseases of the circulatory system: Secondary | ICD-10-CM | POA: Insufficient documentation

## 2011-09-22 DIAGNOSIS — J449 Chronic obstructive pulmonary disease, unspecified: Secondary | ICD-10-CM | POA: Diagnosis not present

## 2011-09-22 DIAGNOSIS — I4949 Other premature depolarization: Secondary | ICD-10-CM | POA: Diagnosis not present

## 2011-09-22 DIAGNOSIS — J4489 Other specified chronic obstructive pulmonary disease: Secondary | ICD-10-CM | POA: Insufficient documentation

## 2011-09-22 DIAGNOSIS — R0609 Other forms of dyspnea: Secondary | ICD-10-CM | POA: Insufficient documentation

## 2011-09-22 DIAGNOSIS — Z87891 Personal history of nicotine dependence: Secondary | ICD-10-CM | POA: Insufficient documentation

## 2011-09-22 DIAGNOSIS — R0989 Other specified symptoms and signs involving the circulatory and respiratory systems: Secondary | ICD-10-CM | POA: Insufficient documentation

## 2011-09-22 DIAGNOSIS — Z01810 Encounter for preprocedural cardiovascular examination: Secondary | ICD-10-CM | POA: Diagnosis not present

## 2011-09-22 MED ORDER — REGADENOSON 0.4 MG/5ML IV SOLN
0.4000 mg | Freq: Once | INTRAVENOUS | Status: AC
  Administered 2011-09-22: 0.4 mg via INTRAVENOUS

## 2011-09-22 MED ORDER — TECHNETIUM TC 99M TETROFOSMIN IV KIT
10.0000 | PACK | Freq: Once | INTRAVENOUS | Status: AC | PRN
  Administered 2011-09-22: 10 via INTRAVENOUS

## 2011-09-22 MED ORDER — TECHNETIUM TC 99M TETROFOSMIN IV KIT
30.0000 | PACK | Freq: Once | INTRAVENOUS | Status: AC | PRN
  Administered 2011-09-22: 30 via INTRAVENOUS

## 2011-09-22 NOTE — Progress Notes (Signed)
[facility] SITE 3 NUCLEAR MED [address] [place] [address] [phone]  Cardiology Nuclear Med Study  Ricardo Perkins is a 78 y.o. male [id number] [birth year]   Nuclear Med Background Indication for Stress Test:  Evaluation for Ischemia and Surgical Clearance for pending Right TKR with Dr Despina Hick on 11/23/11 History: 4/08 Myocardial Perfusion Study: EF: 71% mild ischemia lateral apex, 03/09 ECHO: EF: 60-65% mild LVH mild AI, COPD, AFIB Cardiac Risk Factors: Family History - CAD, History of Smoking and Hypertension  Symptoms:  DOE   Nuclear Pre-Procedure Caffeine/Decaff Intake:  None NPO After: 7:00pm   Lungs:  clear IV 0.9% NS with Angio Cath:  20g  IV Site: R Wrist  IV Started by:  Cathlyn Parsons, RN  Chest Size (in):  38 Cup Size: n/a  Height: 5\' 7"  (1.702 m)  Weight:  188 lb (85.276 kg)  BMI:  Body mass index is 29.44 kg/(m^2). Tech Comments:  Atenolol held x 14 hrs    Nuclear Med Study 1 or 2 day study: 1 day  Stress Test Type:  Lexiscan  Reading MD: Olga Millers, MD  Order Authorizing Provider: Ripley Fraise and Azucena Kuba  Resting Radionuclide: Technetium 87m Tetrofosmin  Resting Radionuclide Dose: 10.9 mCi   Stress Radionuclide:  Technetium 62m Tetrofosmin  Stress Radionuclide Dose: 33.0 mCi           Stress Protocol Rest HR: 53 Stress HR: 65  Rest BP: 128/74 Stress BP: 136/77  Exercise Time (min): n/a METS: n/a   Predicted Max HR: 134 bpm % Max HR: 48.51 bpm Rate Pressure Product: 8840   Dose of Adenosine (mg):  n/a Dose of Lexiscan: 0.4 mg  Dose of Atropine (mg): n/a Dose of Dobutamine: n/a mcg/kg/min (at max HR)  Stress Test Technologist: Milana Na, EMT-P  Nuclear Technologist:  Domenic Polite, CNMT     Rest Procedure:  Myocardial perfusion imaging was performed at rest 45 minutes following the intravenous administration of Technetium 65m Tetrofosmin. Rest ECG: Sinus Bradycardia  Stress Procedure:  The  patient received IV Lexiscan 0.4 mg over 15-seconds.  Technetium 51m Tetrofosmin injected at 30-seconds.  There were no significant changes, feels weird in his chest , and occ pvcs with Lexiscan.  Quantitative spect images were obtained after a 45 minute delay. Stress ECG: No significant ST segment change suggestive of ischemia.  QPS Raw Data Images:  Acquisition technically good; normal left ventricular size. Stress Images:  There is decreased uptake in the apex. Rest Images:  Normal homogeneous uptake in all areas of the myocardium. Subtraction (SDS):  These findings are consistent with very mild ischemia. Transient Ischemic Dilatation (Normal <1.22):  1.05 Lung/Heart Ratio (Normal <0.45):  .48  Quantitative Gated Spect Images QGS EDV:  123 ml QGS ESV:  47 ml QGS cine images:  NL LV Function; NL Wall Motion QGS EF: 62%  Impression Exercise Capacity:  Lexiscan with no exercise. BP Response:  Normal blood pressure response. Clinical Symptoms:  No chest pain. ECG Impression:  No significant ST segment change suggestive of ischemia. Comparison with Prior Nuclear Study: No images to compare  Overall Impression:  Low risk stress nuclear study with a small reversible defect in the apex suggestive of very mild apical ischemia.   Olga Millers

## 2011-09-24 ENCOUNTER — Ambulatory Visit: Payer: Medicare Other | Admitting: Cardiology

## 2011-10-14 ENCOUNTER — Ambulatory Visit (INDEPENDENT_AMBULATORY_CARE_PROVIDER_SITE_OTHER): Payer: Medicare Other | Admitting: *Deleted

## 2011-10-14 DIAGNOSIS — I4891 Unspecified atrial fibrillation: Secondary | ICD-10-CM | POA: Diagnosis not present

## 2011-10-14 LAB — POCT INR: INR: 2.7

## 2011-10-17 ENCOUNTER — Ambulatory Visit (INDEPENDENT_AMBULATORY_CARE_PROVIDER_SITE_OTHER): Payer: Medicare Other | Admitting: Family Medicine

## 2011-10-17 ENCOUNTER — Encounter: Payer: Self-pay | Admitting: Family Medicine

## 2011-10-17 VITALS — BP 140/80 | Temp 97.7°F | Wt 197.0 lb

## 2011-10-17 DIAGNOSIS — H8110 Benign paroxysmal vertigo, unspecified ear: Secondary | ICD-10-CM

## 2011-10-17 MED ORDER — MECLIZINE HCL 12.5 MG PO TABS: 12.5000 mg | ORAL_TABLET | Freq: Three times a day (TID) | ORAL | Status: DC | PRN

## 2011-10-17 NOTE — Progress Notes (Signed)
  Subjective:    Patient ID: Ricardo Perkins, male    DOB: 1922-09-12, 78 y.o.   MRN: [id number]  HPI  Acute Saturday clinic. 5 day history of dizziness. By description this is vertigo and worse with moving head to the right. He has not any associated nausea or vomiting. Denies headache. No fever or chills. No nasal congestion. No sinus pressure. He denies any history of falls, speech change, dysphagia, or ataxia. Ambulating with walker. Similar episode a few years ago.  He has chronic problems including atrial fibrillation and hypertension. Takes Coumadin. No recent bleeding complications.  Past Medical History  Diagnosis Date  . INSOMNIA, PERSISTENT 08/22/2008  . DECREASED HEARING 10/26/2007  . HYPERTENSION 07/23/2008  . BRADYCARDIA 04/01/2009  . C O P D 10/26/2007  . RENAL INSUFFICIENCY 04/01/2009  . BENIGN PROSTATIC HYPERTROPHY, WITH OBSTRUCTION 11/21/2007  . ASTEATOTIC ECZEMA 03/29/2008  . INSOMNIA UNSPECIFIED 11/21/2007  . LEG EDEMA, BILATERAL 01/07/2010  . PAROXYSMAL ATRIAL FIBRILLATION 11/21/2007   Past Surgical History  Procedure Date  . Appendectomy     @ 23  . Tonsillectomy   . Back surgery   . Hernia repair 01/2011    reports that he has quit smoking. He has quit using smokeless tobacco. He reports that he does not drink alcohol or use illicit drugs. family history includes Cancer in his mother; Cirrhosis in his sister; Heart attack in his brother and father; and Heart disease in his father.  There is no history of Diabetes. No Known Allergies    Review of Systems  Constitutional: Negative for fever and chills.  HENT: Positive for hearing loss (chronic). Negative for trouble swallowing.   Eyes: Negative for visual disturbance.  Respiratory: Negative for chest tightness.   Cardiovascular: Negative for chest pain.  Neurological: Positive for dizziness. Negative for headaches.  Psychiatric/Behavioral: Negative for confusion.       Objective:   Physical Exam  Constitutional: He  is oriented to person, place, and time. He appears well-developed and well-nourished.  HENT:  Mouth/Throat: Oropharynx is clear and moist.  Eyes:       Irregular R pupil from prior surgery.  Neck: Neck supple. No thyromegaly present.  Cardiovascular: Normal rate and regular rhythm.   Pulmonary/Chest: Effort normal and breath sounds normal. No respiratory distress. He has no wheezes. He has no rales.  Musculoskeletal: He exhibits no edema.  Lymphadenopathy:    He has no cervical adenopathy.  Neurological: He is alert and oriented to person, place, and time. He has normal reflexes. No cranial nerve deficit. Coordination normal.       Cerebellar normal finger to nose.  No nystagmus.  Symptoms reproduced with head movement to right but not the left.  Psychiatric: He has a normal mood and affect. His behavior is normal.          Assessment & Plan:  Vertigo. Suspect benign positional vertigo.  Recommend walker all times to reduce risk of falls. Would limit medication at his age and only take meclizine low-dose for any severe symptoms associated with nausea. Consider repositioning maneuvers if symptoms persist

## 2011-10-17 NOTE — Patient Instructions (Signed)

## 2011-10-19 ENCOUNTER — Telehealth: Payer: Self-pay | Admitting: Cardiology

## 2011-10-19 ENCOUNTER — Telehealth: Payer: Self-pay

## 2011-10-19 NOTE — Telephone Encounter (Signed)
Pt was seen last month with scott, and had stress test about 3 weeks ago, dr Despina Hick w/ [facility] still has not received surgical clearence for surgery on monday, pt's dtr needs it to be sent asap and  needs to know when to stop coumadin pt's dtr  [phone]

## 2011-10-19 NOTE — Telephone Encounter (Signed)
[facility] Triage Call Report Triage Record Num: [id number] Operator: Merlinda Frederick Patient Name: Ricardo Perkins Call Date & Time: 10/17/2011 12:02:30PM Patient Phone: [phone] PCP: Illene Regulus Patient Gender: Male PCP Fax : [phone] Patient DOB: 1923/01/18 Practice Name: [facility] Reason for Call: Caller: Vicki/Other; PCP: Illene Regulus; CB#: [phone]; Call regarding Vertigo; Pt daughter/Vicki calling 10/17/11 about dizziness. Onset 10/16/11. Triaged per Dizziness or Vertigo Guideline, see in 4 hr disp for "having sensations of turning or spinning that affects balance." Appt scheduled in EPIC for 10/17/11 at 1345 with Dr. Caryl Never. Advised to call back if sxs worsen prior to appt time, verbalized understanding. Protocol(s) Used: Dizziness or Vertigo Recommended Outcome per Protocol: See Provider within 4 hours Reason for Outcome: Having sensations of turning or spinning that affects balance AND not responsive to 4 hours of home care Care Advice: ~ Call provider if symptoms worsen or new symptoms develop. ~ List, or take, all current prescription(s), nonprescription or alternative medication(s) to provider for evaluation. 10/17/2011 12:16:30PM Page 1 of 1 CAN

## 2011-10-19 NOTE — Telephone Encounter (Signed)
Spoke with pt dtr, myoview was low risk. Will get okay for surgery from dr [name]

## 2011-10-19 NOTE — Telephone Encounter (Signed)
Ok for surgery Ricardo Perkins  

## 2011-10-20 ENCOUNTER — Encounter (HOSPITAL_COMMUNITY): Payer: Self-pay | Admitting: Pharmacy Technician

## 2011-10-21 ENCOUNTER — Encounter: Payer: Self-pay | Admitting: *Deleted

## 2011-10-21 NOTE — Telephone Encounter (Signed)
Letter of clearance faxed to dr aluisio's office. Message left for dtr.

## 2011-10-22 ENCOUNTER — Encounter (HOSPITAL_COMMUNITY)
Admission: RE | Admit: 2011-10-22 | Discharge: 2011-10-22 | Disposition: A | Discharge status: Home / Self Care | Payer: Medicare Other | Source: Ambulatory Visit | Attending: Orthopedic Surgery | Admitting: Orthopedic Surgery

## 2011-10-22 ENCOUNTER — Encounter (HOSPITAL_COMMUNITY): Payer: Self-pay

## 2011-10-22 HISTORY — DX: Unspecified osteoarthritis, unspecified site: M19.90

## 2011-10-22 LAB — URINALYSIS, ROUTINE W REFLEX MICROSCOPIC
Glucose, UA: NEGATIVE mg/dL
Ketones, ur: NEGATIVE mg/dL
Leukocytes, UA: NEGATIVE
Nitrite: NEGATIVE
Specific Gravity, Urine: 1.008 (ref 1.005–1.030)
pH: 6.5 (ref 5.0–8.0)

## 2011-10-22 LAB — CBC
MCH: 30 pg (ref 26.0–34.0)
MCHC: 33 g/dL (ref 30.0–36.0)
MCV: 90.8 fL (ref 78.0–100.0)
Platelets: 172 10*3/uL (ref 150–400)
RBC: 5.54 MIL/uL (ref 4.22–5.81)
RDW: 14.7 % (ref 11.5–15.5)

## 2011-10-22 LAB — PROTIME-INR: INR: 1.45 (ref 0.00–1.49)

## 2011-10-22 LAB — SURGICAL PCR SCREEN: MRSA, PCR: NEGATIVE

## 2011-10-22 LAB — COMPREHENSIVE METABOLIC PANEL
ALT: 20 U/L (ref 0–53)
AST: 16 U/L (ref 0–37)
Albumin: 4.1 g/dL (ref 3.5–5.2)
CO2: 27 mEq/L (ref 19–32)
Calcium: 9.4 mg/dL (ref 8.4–10.5)
Creatinine, Ser: 1.3 mg/dL (ref 0.50–1.35)
GFR calc non Af Amer: 48 mL/min — ABNORMAL LOW (ref >90)
Sodium: 138 mEq/L (ref 135–145)
Total Protein: 7 g/dL (ref 6.0–8.3)

## 2011-10-22 MED ORDER — CEFAZOLIN SODIUM 1-5 GM-% IV SOLN: 1.0000 g | INTRAVENOUS | Status: DC

## 2011-10-22 NOTE — Pre-Procedure Instructions (Signed)
09/22/11 Stress test EPIC 09/11/11 Cardiology office visit note with Tereso Newcomer PA in [facility] 09/11/11 EKG EPIC  CXR 12/13/10 EPIC

## 2011-10-22 NOTE — Pre-Procedure Instructions (Signed)
10/22/11 Daughter reports at preop visit that with last surgery B/P dropped prior to surgery in the preop area. .  Nurse only had patient take betablocker am of surgery and not other B/P med.  I told daughter nurses in Short Stay would assess B/P am of surgery to see if needed other B/P medication.  Daughter and patient voiced understanding.

## 2011-10-22 NOTE — Patient Instructions (Signed)
22 Ricardo Perkins  10/22/2011   Your procedure is scheduled on:  10/26/11 155pm-300pm  Report to [facility] at 1130 AM.  Call this number if you have problems the morning of surgery: [phone]   Remember:   Do not eat food:After Midnight.  May have clear liquids:until 0700am then npo    Take these medicines the morning of surgery with A SIP OF WATER:   Do not wear jewelry, .  Do not wear lotions, powders, or perfumes.    Do not bring valuables to the hospital.  Contacts, dentures or bridgework may not be worn into surgery.  Leave suitcase in the car. After surgery it may be brought to your room.  For patients admitted to the hospital, checkout time is 11:00 AM the day of discharge.     Special Instructions: CHG Shower Use Special Wash: 1/2 bottle night before surgery and 1/2 bottle morning of surgery. shower chin to toes with CHG.  Wash face and private parts with regular soap.    Please read over the following fact sheets that you were given: MRSA Information, coughing and deep breathing exercises, Incentive Spirometry Fact Sheet, Blood Transfusion Fact Sheet, leg exercises

## 2011-10-23 DIAGNOSIS — M1710 Unilateral primary osteoarthritis, unspecified knee: Secondary | ICD-10-CM | POA: Diagnosis not present

## 2011-10-23 DIAGNOSIS — IMO0002 Reserved for concepts with insufficient information to code with codable children: Secondary | ICD-10-CM | POA: Diagnosis not present

## 2011-10-23 NOTE — H&P (Signed)
Ricardo Perkins DOB: 1922/09/15   Chief Complaint: right knee pain  History of Present Illness The patient is a 78 year old male who comes in today for a preoperative History and Physical. The patient is scheduled for a right total knee arthroplasty to be performed by Dr. Gus Perkins. [name], MD at [facility] on Monday October 26, 2011 . The patient reports right knee symptoms including pain which began years but getting progressively worse over past 2 years. Past treatment for this problem has included intra-articular injection of corticosteroids and Euflexxa series by Dr. Simonne Perkins, which provided no relief. He has knee pain with weightbearing, medial knee tenderness, swelling and instability. The knee has gotten much worse over the past year. He has popping and grinding but no locking. He has morning stiffness. Denies any injury with the knee in the past. Increased pain with weightbearing but denies pain at rest or at night. He also states that he has more difficulty with getting in and out of the car and going up and down steps. He has been riding a stationary bike several times a day up until the past summer at which time he had to have a double hernia repair. Most predictable means for decreased pain and increased function is a right total knee arthroplasty. Risks and benefits of the surgery discussed. PCP: Dr. Illene Perkins Cardio: Dr. Jens Perkins Pulm: Dr. Clydie Perkins    Past MedicalHistory Osteoarthritis, Knee (715.96) Enlarged prostate Atrial fibrillation  Emphysema Of Lung Hypertension Chronic Obstructive Lung Disease   Allergies No Known Drug Allergies.    Family History Congestive Heart Failure. father Heart Disease. father Heart disease in male family member before age 63 Osteoarthritis. sister Father. deceased age 75 due to myocardial infarction Mother. deceased age 56 due to infection from GI illness Brother 1. Deceased. MI Sister 1.  liver cancer   Social History Exercise. Exercises rarely; does running / walking Illicit drug use. no Living situation. live alone Current work status. retired Financial planner (Currently). no Drug/Alcohol Rehab (Previously). no Marital status. widowed Tobacco use. former smoker; smoke(d) 1 1/2 pack(s) per day Number of flights of stairs before winded. less than 1 Pain Contract. no Tobacco / smoke exposure. no Children. 3 Alcohol use. current drinker; drinks wine; only occasionally per week Caregiver. after surgery: daughter Advance Directives. living will   Medication History Atenolol (25MG  Tablet, Oral daily) Active. Temazepam (30MG  Capsule, Oral daily) Active. AmLODIPine Besylate (5MG  Tablet, Oral daily) Active. Warfarin Sodium (3MG  Tablet, Oral daily) Active. Omeprazole (20MG  Packet, Oral daily) Active. Tamsulosin HCl (0.4MG  Capsule, Oral two times daily) Active. Finasteride (5MG  Tablet, Oral daily) Active.   Past Surgical History Tonsillectomy Inguinal Hernia Repair. open: bilateral Appendectomy. 1946 Inguinal Hernia Repair-Bilateral. July 2012 Cataract Extraction-Bilateral. 2006 Finger Surgery. 1940s   Review of Systems General:Not Present- Chills, Fever, Night Sweats, Fatigue, Weight Gain, Weight Loss and Memory Loss. Skin:Not Present- Hives, Itching, Rash, Eczema and Lesions. HEENT:Present- Hearing Loss and Dentures. Not Present- Tinnitus, Headache, Double Vision and Visual Loss. Respiratory:Present- Shortness of breath with exertion. Not Present- Shortness of breath at rest, Allergies, Coughing up blood and Chronic Cough. Cardiovascular:Not Present- Chest Pain, Racing/skipping heartbeats, Difficulty Breathing Lying Down, Murmur, Swelling and Palpitations. Gastrointestinal:Not Present- Bloody Stool, Heartburn, Abdominal Pain, Vomiting, Nausea, Constipation, Diarrhea, Difficulty Swallowing, Jaundice and Loss of appetitie. Male  Genitourinary:Present- Weak urinary stream. Not Present- Urinary frequency, Blood in Urine, Discharge, Flank Pain, Incontinence, Painful Urination, Urgency, Urinary Retention and Urinating at Night. Musculoskeletal:Present- Joint Pain. Not Present- Muscle  Weakness, Muscle Pain, Joint Swelling, Back Pain, Morning Stiffness and Spasms. Neurological:Not Present- Tremor, Dizziness, Blackout spells, Paralysis, Difficulty with balance and Weakness. Psychiatric:Not Present- Insomnia.   Vitals Weight: 188 lb Height: 68 in Body Surface Area: 2.02 m Body Mass Index: 28.59 kg/m Pulse: 76 (Regular) Resp.: 18 (Unlabored) BP: 116/68 (Sitting, Left Arm, Standard)   Physical Exam General Mental Status - Alert, cooperative and good historian. General Appearance- pleasant. Not in acute distress. Orientation- Oriented X3. Build & Nutrition- Well nourished and Well developed. Head and Neck Head- normocephalic, atraumatic . Neck Global Assessment- supple. no bruit auscultated on the right and no bruit auscultated on the left. Eye Pupil- Bilateral- PERRLA. Motion- Bilateral- EOMI. Chest and Lung Exam Auscultation: Breath sounds:- clear at anterior chest wall and - clear at posterior chest wall. Adventitious sounds:- No Adventitious sounds. Cardiovascular Auscultation:Rhythm- Regular rate and rhythm. Heart Sounds- S1 WNL and S2 WNL. Murmurs & Other Heart Sounds:Auscultation of the heart reveals - No Murmurs. Abdomen Palpation/Percussion:Tenderness- Abdomen is non-tender to palpation. Rigidity (guarding)- Abdomen is soft. Auscultation:Auscultation of the abdomen reveals - Bowel sounds normal. Male Genitourinary Not done, not pertinent to present illness Peripheral Vascular Upper Extremity: Palpation:- Pulses bilaterally normal. Lower Extremity: Palpation:- Pulses bilaterally normal. Neurologic Examination of related systems reveals - normal  muscle strength and tone in all extremities. Neurologic evaluation reveals - upper and lower extremity deep tendon reflexes intact bilaterally . Sensory: Light Touch:Decreased- right lateral thigh Musculoskeletal Right Hip: ROM: - Full ROM of the hip and - pain-free. Right Knee: Inspection and Palpation: Tenderness - medial joint line tender to palpation. no tenderness to palpation of the lateral joint line. Swelling - none. Effusion - none. Crepitus - mild. Pulses - 2+. Sensation - normal. Other characteristics - Note: approxiamtely 12 degree valgus deformity ROM: Flexion: AROM - 110 . PROM - 120 . Extension: AROM - 0 . PROM - 0 . Instability - No instability about the knee. Lower Extremity Strength - Right - 5/5 throughout the lower extremity.  RADIOGRAPHS: AP both knees and lateral show bone on bone arthritis in the medial and patellofemoral compartments of the right knee with varus deformity, subchondral sclerosis and osteophyte formation.  Assessment & Plan Osteoarthritis, Knee (715.96) Right total knee arthroplasty     Dimitri Ped, PA-C

## 2011-10-25 MED ORDER — CEFAZOLIN SODIUM-DEXTROSE 2-3 GM-% IV SOLR
2.0000 g | Freq: Once | INTRAVENOUS | Status: AC
  Administered 2011-10-26: 2 g via INTRAVENOUS

## 2011-10-26 ENCOUNTER — Encounter (HOSPITAL_COMMUNITY): Payer: Self-pay | Admitting: *Deleted

## 2011-10-26 ENCOUNTER — Encounter (HOSPITAL_COMMUNITY)
Admission: RE | Disposition: A | Discharge status: Home Health | Payer: Self-pay | Source: Ambulatory Visit | Attending: Orthopedic Surgery

## 2011-10-26 ENCOUNTER — Ambulatory Visit (HOSPITAL_COMMUNITY): Payer: Medicare Other | Admitting: Anesthesiology

## 2011-10-26 ENCOUNTER — Encounter (HOSPITAL_COMMUNITY): Payer: Self-pay | Admitting: Anesthesiology

## 2011-10-26 ENCOUNTER — Inpatient Hospital Stay (HOSPITAL_COMMUNITY)
Admission: RE | Admit: 2011-10-26 | Discharge: 2011-10-31 | DRG: 470 | Disposition: A | Discharge status: Home Health | Payer: Medicare Other | Source: Ambulatory Visit | Attending: Orthopedic Surgery | Admitting: Orthopedic Surgery

## 2011-10-26 DIAGNOSIS — Z87891 Personal history of nicotine dependence: Secondary | ICD-10-CM | POA: Diagnosis not present

## 2011-10-26 DIAGNOSIS — I959 Hypotension, unspecified: Secondary | ICD-10-CM

## 2011-10-26 DIAGNOSIS — J449 Chronic obstructive pulmonary disease, unspecified: Secondary | ICD-10-CM | POA: Diagnosis not present

## 2011-10-26 DIAGNOSIS — F99 Mental disorder, not otherwise specified: Secondary | ICD-10-CM | POA: Diagnosis not present

## 2011-10-26 DIAGNOSIS — Z01812 Encounter for preprocedural laboratory examination: Secondary | ICD-10-CM

## 2011-10-26 DIAGNOSIS — R410 Disorientation, unspecified: Secondary | ICD-10-CM

## 2011-10-26 DIAGNOSIS — J4489 Other specified chronic obstructive pulmonary disease: Secondary | ICD-10-CM | POA: Diagnosis present

## 2011-10-26 DIAGNOSIS — Z96659 Presence of unspecified artificial knee joint: Secondary | ICD-10-CM

## 2011-10-26 DIAGNOSIS — I4891 Unspecified atrial fibrillation: Secondary | ICD-10-CM | POA: Diagnosis present

## 2011-10-26 DIAGNOSIS — M1710 Unilateral primary osteoarthritis, unspecified knee: Principal | ICD-10-CM | POA: Diagnosis present

## 2011-10-26 DIAGNOSIS — D62 Acute posthemorrhagic anemia: Secondary | ICD-10-CM | POA: Diagnosis not present

## 2011-10-26 DIAGNOSIS — M25569 Pain in unspecified knee: Secondary | ICD-10-CM | POA: Diagnosis not present

## 2011-10-26 HISTORY — PX: TOTAL KNEE ARTHROPLASTY: SHX125

## 2011-10-26 LAB — TYPE AND SCREEN
ABO/RH(D): O POS
Antibody Screen: NEGATIVE

## 2011-10-26 SURGERY — ARTHROPLASTY, KNEE, TOTAL
Anesthesia: Spinal | Site: Knee | Laterality: Right | Wound class: Clean

## 2011-10-26 MED ORDER — SODIUM CHLORIDE 0.9 % IR SOLN
Status: DC | PRN
  Administered 2011-10-26: 3000 mL

## 2011-10-26 MED ORDER — ONDANSETRON HCL 4 MG PO TABS: 4.0000 mg | ORAL_TABLET | Freq: Four times a day (QID) | ORAL | Status: DC | PRN

## 2011-10-26 MED ORDER — ACETAMINOPHEN 10 MG/ML IV SOLN
INTRAVENOUS | Status: AC
  Filled 2011-10-26: qty 100

## 2011-10-26 MED ORDER — DEXAMETHASONE SODIUM PHOSPHATE 10 MG/ML IJ SOLN
10.0000 mg | Freq: Once | INTRAMUSCULAR | Status: AC
  Administered 2011-10-26: 10 mg via INTRAVENOUS
  Filled 2011-10-26: qty 1

## 2011-10-26 MED ORDER — BUPIVACAINE 0.25 % ON-Q PUMP SINGLE CATH 300ML
INJECTION | Status: AC
  Filled 2011-10-26: qty 300

## 2011-10-26 MED ORDER — BUPIVACAINE 0.25 % ON-Q PUMP SINGLE CATH 300ML
300.0000 mL | INJECTION | Status: DC
  Filled 2011-10-26: qty 300

## 2011-10-26 MED ORDER — ACETAMINOPHEN 10 MG/ML IV SOLN
INTRAVENOUS | Status: DC | PRN
  Administered 2011-10-26: 1000 mg via INTRAVENOUS

## 2011-10-26 MED ORDER — ACETAMINOPHEN 650 MG RE SUPP: 650.0000 mg | Freq: Four times a day (QID) | RECTAL | Status: DC | PRN

## 2011-10-26 MED ORDER — FINASTERIDE 5 MG PO TABS
5.0000 mg | ORAL_TABLET | Freq: Every day | ORAL | Status: DC
  Administered 2011-10-26 – 2011-10-31 (×6): 5 mg via ORAL
  Filled 2011-10-26 (×6): qty 1

## 2011-10-26 MED ORDER — METOCLOPRAMIDE HCL 10 MG PO TABS: 5.0000 mg | ORAL_TABLET | Freq: Three times a day (TID) | ORAL | Status: DC | PRN

## 2011-10-26 MED ORDER — POLYETHYLENE GLYCOL 3350 17 G PO PACK
17.0000 g | PACK | Freq: Every day | ORAL | Status: DC | PRN
  Filled 2011-10-26: qty 1

## 2011-10-26 MED ORDER — ATENOLOL 25 MG PO TABS
25.0000 mg | ORAL_TABLET | Freq: Two times a day (BID) | ORAL | Status: DC
  Administered 2011-10-26 – 2011-10-31 (×7): 25 mg via ORAL
  Filled 2011-10-26 (×11): qty 1

## 2011-10-26 MED ORDER — AMLODIPINE BESYLATE 5 MG PO TABS
5.0000 mg | ORAL_TABLET | Freq: Every day | ORAL | Status: DC
  Administered 2011-10-27 – 2011-10-28 (×2): 5 mg via ORAL
  Filled 2011-10-26 (×5): qty 1

## 2011-10-26 MED ORDER — MORPHINE SULFATE 2 MG/ML IJ SOLN
1.0000 mg | INTRAMUSCULAR | Status: DC | PRN
  Administered 2011-10-26 – 2011-10-27 (×2): 2 mg via INTRAVENOUS
  Filled 2011-10-26 (×2): qty 1

## 2011-10-26 MED ORDER — MECLIZINE HCL 12.5 MG PO TABS
12.5000 mg | ORAL_TABLET | Freq: Three times a day (TID) | ORAL | Status: DC | PRN
  Filled 2011-10-26: qty 1

## 2011-10-26 MED ORDER — METHOCARBAMOL 500 MG PO TABS
500.0000 mg | ORAL_TABLET | Freq: Four times a day (QID) | ORAL | Status: DC | PRN
  Administered 2011-10-27 – 2011-10-29 (×4): 500 mg via ORAL
  Filled 2011-10-26 (×4): qty 1

## 2011-10-26 MED ORDER — DIPHENHYDRAMINE HCL 12.5 MG/5ML PO ELIX: 12.5000 mg | ORAL_SOLUTION | ORAL | Status: DC | PRN

## 2011-10-26 MED ORDER — ACETAMINOPHEN 325 MG PO TABS
650.0000 mg | ORAL_TABLET | Freq: Four times a day (QID) | ORAL | Status: DC | PRN
  Administered 2011-10-29 – 2011-10-31 (×3): 650 mg via ORAL
  Filled 2011-10-26 (×3): qty 2

## 2011-10-26 MED ORDER — EPHEDRINE SULFATE 50 MG/ML IJ SOLN
INTRAMUSCULAR | Status: DC | PRN
  Administered 2011-10-26: 10 mg via INTRAVENOUS
  Administered 2011-10-26: 5 mg via INTRAVENOUS
  Administered 2011-10-26: 10 mg via INTRAVENOUS

## 2011-10-26 MED ORDER — ACETAMINOPHEN 10 MG/ML IV SOLN
1000.0000 mg | Freq: Four times a day (QID) | INTRAVENOUS | Status: AC
  Administered 2011-10-26 – 2011-10-27 (×4): 1000 mg via INTRAVENOUS
  Filled 2011-10-26 (×3): qty 100

## 2011-10-26 MED ORDER — WARFARIN - PHARMACIST DOSING INPATIENT: Freq: Every day | Status: DC

## 2011-10-26 MED ORDER — ONDANSETRON HCL 4 MG/2ML IJ SOLN
INTRAMUSCULAR | Status: DC | PRN
  Administered 2011-10-26 (×2): 2 mg via INTRAVENOUS

## 2011-10-26 MED ORDER — PANTOPRAZOLE SODIUM 40 MG PO TBEC: 40.0000 mg | DELAYED_RELEASE_TABLET | Freq: Every day | ORAL | Status: DC

## 2011-10-26 MED ORDER — LACTATED RINGERS IV SOLN
INTRAVENOUS | Status: DC
  Administered 2011-10-26: 1000 mL via INTRAVENOUS

## 2011-10-26 MED ORDER — MORPHINE SULFATE 2 MG/ML IJ SOLN
INTRAMUSCULAR | Status: AC
  Filled 2011-10-26: qty 1

## 2011-10-26 MED ORDER — CEFAZOLIN SODIUM-DEXTROSE 2-3 GM-% IV SOLR
INTRAVENOUS | Status: AC
  Filled 2011-10-26: qty 50

## 2011-10-26 MED ORDER — METHOCARBAMOL 100 MG/ML IJ SOLN
500.0000 mg | Freq: Four times a day (QID) | INTRAVENOUS | Status: DC | PRN
  Administered 2011-10-26 – 2011-10-27 (×2): 500 mg via INTRAVENOUS
  Filled 2011-10-26 (×2): qty 5

## 2011-10-26 MED ORDER — OXYCODONE HCL 5 MG PO TABS
5.0000 mg | ORAL_TABLET | ORAL | Status: DC | PRN
  Administered 2011-10-26 – 2011-10-28 (×5): 10 mg via ORAL
  Administered 2011-10-28: 5 mg via ORAL
  Administered 2011-10-28 – 2011-10-29 (×2): 10 mg via ORAL
  Filled 2011-10-26 (×2): qty 1
  Filled 2011-10-26 (×2): qty 2
  Filled 2011-10-26: qty 1
  Filled 2011-10-26 (×4): qty 2

## 2011-10-26 MED ORDER — TEMAZEPAM 15 MG PO CAPS: 30.0000 mg | ORAL_CAPSULE | Freq: Every evening | ORAL | Status: DC | PRN

## 2011-10-26 MED ORDER — PROPOFOL 10 MG/ML IV EMUL
INTRAVENOUS | Status: DC | PRN
  Administered 2011-10-26: 50 ug/kg/min via INTRAVENOUS

## 2011-10-26 MED ORDER — METOCLOPRAMIDE HCL 5 MG/ML IJ SOLN: 5.0000 mg | Freq: Three times a day (TID) | INTRAMUSCULAR | Status: DC | PRN

## 2011-10-26 MED ORDER — SODIUM CHLORIDE 0.9 % IR SOLN
Status: DC | PRN
  Administered 2011-10-26: 1

## 2011-10-26 MED ORDER — FLEET ENEMA 7-19 GM/118ML RE ENEM: 1.0000 | ENEMA | Freq: Once | RECTAL | Status: AC | PRN

## 2011-10-26 MED ORDER — ENOXAPARIN SODIUM 30 MG/0.3ML ~~LOC~~ SOLN
30.0000 mg | Freq: Two times a day (BID) | SUBCUTANEOUS | Status: DC
  Administered 2011-10-27 – 2011-10-31 (×9): 30 mg via SUBCUTANEOUS
  Filled 2011-10-26 (×11): qty 0.3

## 2011-10-26 MED ORDER — WARFARIN SODIUM 5 MG PO TABS
5.0000 mg | ORAL_TABLET | Freq: Once | ORAL | Status: AC
  Administered 2011-10-26: 5 mg via ORAL
  Filled 2011-10-26: qty 1

## 2011-10-26 MED ORDER — PHENOL 1.4 % MT LIQD
1.0000 | OROMUCOSAL | Status: DC | PRN
  Filled 2011-10-26: qty 177

## 2011-10-26 MED ORDER — BUPIVACAINE ON-Q PAIN PUMP (FOR ORDER SET NO CHG)
INJECTION | Status: DC
  Filled 2011-10-26: qty 1

## 2011-10-26 MED ORDER — SODIUM CHLORIDE 0.9 % IV SOLN
INTRAVENOUS | Status: DC
  Administered 2011-10-26 – 2011-10-27 (×3): via INTRAVENOUS

## 2011-10-26 MED ORDER — TAMSULOSIN HCL 0.4 MG PO CAPS
0.4000 mg | ORAL_CAPSULE | Freq: Two times a day (BID) | ORAL | Status: DC
  Administered 2011-10-26 – 2011-10-31 (×10): 0.4 mg via ORAL
  Filled 2011-10-26 (×11): qty 1

## 2011-10-26 MED ORDER — DOCUSATE SODIUM 100 MG PO CAPS
100.0000 mg | ORAL_CAPSULE | Freq: Two times a day (BID) | ORAL | Status: DC
  Administered 2011-10-26 – 2011-10-31 (×10): 100 mg via ORAL
  Filled 2011-10-26 (×10): qty 1

## 2011-10-26 MED ORDER — ACETAMINOPHEN 10 MG/ML IV SOLN
1000.0000 mg | Freq: Once | INTRAVENOUS | Status: DC
  Filled 2011-10-26: qty 100

## 2011-10-26 MED ORDER — BUPIVACAINE 0.25 % ON-Q PUMP SINGLE CATH 300ML
INJECTION | Status: DC | PRN
  Administered 2011-10-26: 300 mL

## 2011-10-26 MED ORDER — ONDANSETRON HCL 4 MG/2ML IJ SOLN
4.0000 mg | Freq: Four times a day (QID) | INTRAMUSCULAR | Status: DC | PRN
  Administered 2011-10-28: 4 mg via INTRAVENOUS
  Filled 2011-10-26: qty 2

## 2011-10-26 MED ORDER — CHLORHEXIDINE GLUCONATE 4 % EX LIQD
60.0000 mL | Freq: Once | CUTANEOUS | Status: DC
  Filled 2011-10-26: qty 60

## 2011-10-26 MED ORDER — SODIUM CHLORIDE 0.9 % IV SOLN: INTRAVENOUS | Status: DC

## 2011-10-26 MED ORDER — BISACODYL 10 MG RE SUPP: 10.0000 mg | Freq: Every day | RECTAL | Status: DC | PRN

## 2011-10-26 MED ORDER — CEFAZOLIN SODIUM 1-5 GM-% IV SOLN
1.0000 g | Freq: Four times a day (QID) | INTRAVENOUS | Status: AC
  Administered 2011-10-26 – 2011-10-27 (×3): 1 g via INTRAVENOUS
  Filled 2011-10-26 (×3): qty 50

## 2011-10-26 MED ORDER — MENTHOL 3 MG MT LOZG
1.0000 | LOZENGE | OROMUCOSAL | Status: DC | PRN
  Filled 2011-10-26: qty 9

## 2011-10-26 MED ORDER — TRAMADOL HCL 50 MG PO TABS
50.0000 mg | ORAL_TABLET | Freq: Four times a day (QID) | ORAL | Status: DC | PRN
  Administered 2011-10-29 – 2011-10-30 (×3): 50 mg via ORAL
  Administered 2011-10-31: 100 mg via ORAL
  Filled 2011-10-26 (×2): qty 1
  Filled 2011-10-26: qty 2
  Filled 2011-10-26: qty 1

## 2011-10-26 MED ORDER — LACTATED RINGERS IV SOLN
INTRAVENOUS | Status: DC | PRN
  Administered 2011-10-26: 13:00:00 via INTRAVENOUS

## 2011-10-26 MED ORDER — HYDROMORPHONE HCL PF 1 MG/ML IJ SOLN: 0.2500 mg | INTRAMUSCULAR | Status: DC | PRN

## 2011-10-26 SURGICAL SUPPLY — 51 items
BAG ZIPLOCK 12X15 (MISCELLANEOUS) ×2 IMPLANT
BANDAGE ELASTIC 6 VELCRO ST LF (GAUZE/BANDAGES/DRESSINGS) ×2 IMPLANT
BANDAGE ESMARK 6X9 LF (GAUZE/BANDAGES/DRESSINGS) ×1 IMPLANT
BLADE SAG 18X100X1.27 (BLADE) ×2 IMPLANT
BLADE SAW SGTL 11.0X1.19X90.0M (BLADE) ×2 IMPLANT
BNDG ESMARK 6X9 LF (GAUZE/BANDAGES/DRESSINGS) ×2
BOWL SMART MIX CTS (DISPOSABLE) ×2 IMPLANT
CATH KIT ON-Q SILVERSOAK 5IN (CATHETERS) ×2 IMPLANT
CEMENT HV SMART SET (Cement) ×4 IMPLANT
CLOTH BEACON ORANGE TIMEOUT ST (SAFETY) ×2 IMPLANT
CUFF TOURN SGL QUICK 34 (TOURNIQUET CUFF) ×1
CUFF TRNQT CYL 34X4X40X1 (TOURNIQUET CUFF) ×1 IMPLANT
DRAPE EXTREMITY T 121X128X90 (DRAPE) ×2 IMPLANT
DRAPE POUCH INSTRU U-SHP 10X18 (DRAPES) ×2 IMPLANT
DRAPE U-SHAPE 47X51 STRL (DRAPES) ×2 IMPLANT
DRSG ADAPTIC 3X8 NADH LF (GAUZE/BANDAGES/DRESSINGS) ×2 IMPLANT
DRSG PAD ABDOMINAL 8X10 ST (GAUZE/BANDAGES/DRESSINGS) ×2 IMPLANT
DURAPREP 26ML APPLICATOR (WOUND CARE) ×2 IMPLANT
ELECT REM PT RETURN 9FT ADLT (ELECTROSURGICAL) ×2
ELECTRODE REM PT RTRN 9FT ADLT (ELECTROSURGICAL) ×1 IMPLANT
EVACUATOR 1/8 PVC DRAIN (DRAIN) ×2 IMPLANT
FACESHIELD LNG OPTICON STERILE (SAFETY) ×10 IMPLANT
GLOVE BIO SURGEON STRL SZ7.5 (GLOVE) ×2 IMPLANT
GLOVE BIO SURGEON STRL SZ8 (GLOVE) ×2 IMPLANT
GLOVE BIOGEL PI IND STRL 8 (GLOVE) ×2 IMPLANT
GLOVE BIOGEL PI INDICATOR 8 (GLOVE) ×2
GOWN STRL NON-REIN LRG LVL3 (GOWN DISPOSABLE) ×2 IMPLANT
GOWN STRL REIN XL XLG (GOWN DISPOSABLE) ×2 IMPLANT
HANDPIECE INTERPULSE COAX TIP (DISPOSABLE) ×1
IMMOBILIZER KNEE 20 (SOFTGOODS) ×2
IMMOBILIZER KNEE 20 THIGH 36 (SOFTGOODS) ×1 IMPLANT
KIT BASIN OR (CUSTOM PROCEDURE TRAY) ×2 IMPLANT
MANIFOLD NEPTUNE II (INSTRUMENTS) ×2 IMPLANT
NS IRRIG 1000ML POUR BTL (IV SOLUTION) ×2 IMPLANT
PACK TOTAL JOINT (CUSTOM PROCEDURE TRAY) ×2 IMPLANT
PAD ABD 7.5X8 STRL (GAUZE/BANDAGES/DRESSINGS) ×2 IMPLANT
PADDING CAST COTTON 6X4 STRL (CAST SUPPLIES) ×2 IMPLANT
POSITIONER SURGICAL ARM (MISCELLANEOUS) ×2 IMPLANT
SET HNDPC FAN SPRY TIP SCT (DISPOSABLE) ×1 IMPLANT
SPONGE GAUZE 4X4 12PLY (GAUZE/BANDAGES/DRESSINGS) ×2 IMPLANT
STRIP CLOSURE SKIN 1/2X4 (GAUZE/BANDAGES/DRESSINGS) ×2 IMPLANT
SUCTION FRAZIER 12FR DISP (SUCTIONS) ×2 IMPLANT
SUT MNCRL AB 4-0 PS2 18 (SUTURE) ×2 IMPLANT
SUT PDS AB 1 CT1 27 (SUTURE) ×6 IMPLANT
SUT VIC AB 2-0 CT1 27 (SUTURE) ×3
SUT VIC AB 2-0 CT1 TAPERPNT 27 (SUTURE) ×3 IMPLANT
SUT VLOC 180 0 24IN GS25 (SUTURE) ×2 IMPLANT
TOWEL OR 17X26 10 PK STRL BLUE (TOWEL DISPOSABLE) ×4 IMPLANT
TRAY FOLEY CATH 14FRSI W/METER (CATHETERS) ×2 IMPLANT
WATER STERILE IRR 1500ML POUR (IV SOLUTION) ×2 IMPLANT
WRAP KNEE MAXI GEL POST OP (GAUZE/BANDAGES/DRESSINGS) ×4 IMPLANT

## 2011-10-26 NOTE — Op Note (Signed)
Pre-operative diagnosis- Osteoarthritis  Right knee(s)  Post-operative diagnosis- Osteoarthritis Right knee(s)  Procedure-  Right  Total Knee Arthroplasty  Surgeon- Ricardo Rankin. Rylend Pietrzak, MD  Assistant- Avel Peace, PA-C   Anesthesia-  Spinal  EBL-* No blood loss amount entered *   Drains Hemovac  Tourniquet time-  Total Tourniquet Time Documented: Thigh (Right) - 32 minutes    Complications- None  Condition-PACU - hemodynamically stable.   Brief Clinical Note-  Ricardo Perkins is a 78 y.o. year old male with end stage OA of his right knee with progressively worsening pain and dysfunction. He has constant pain, with activity and at rest and significant functional deficits with difficulties even with ADLs. He has had extensive non-op management including analgesics, injections of cortisone and viscosupplements, and home exercise program, but remains in significant pain with significant dysfunction. Radiographs show bone on bone arthritis medial and patellofemoral compartments with osteophyte formation. He presents now for right Total Knee Arthroplasty.      Procedure in detail---   The patient is brought into the operating room and positioned supine on the operating table. After successful administration of  Spinal,   a tourniquet is placed high on the  Right thigh(s) and the lower extremity is prepped and draped in the usual sterile fashion. Time out is performed by the operating team and then the  Right lower extremity is wrapped in Esmarch, knee flexed and the tourniquet inflated to 300 mmHg.       A midline incision is made with a ten blade through the subcutaneous tissue to the level of the extensor mechanism. A fresh blade is used to make a medial parapatellar arthrotomy. Soft tissue over the proximal medial tibia is subperiosteally elevated to the joint line with a knife and into the semimembranosus bursa with a Cobb elevator. Soft tissue over the proximal lateral tibia is elevated  with attention being paid to avoiding the patellar tendon on the tibial tubercle. The patella is everted, knee flexed 90 degrees and the ACL and PCL are removed. Findings are bone on bone medial and patellofemoral with large osteophyte formation both compartments.        The drill is used to create a starting hole in the distal femur and the canal is thoroughly irrigated with sterile saline to remove the fatty contents. The 5 degree Right  valgus alignment guide is placed into the femoral canal and the distal femoral cutting block is pinned to remove 11 mm off the distal femur. Resection is made with an oscillating saw.      The tibia is subluxed forward and the menisci are removed. The extramedullary alignment guide is placed referencing proximally at the medial aspect of the tibial tubercle and distally along the second metatarsal axis and tibial crest. The block is pinned to remove 2mm off the more deficient medial  side. Resection is made with an oscillating saw. Size 4is the most appropriate size for the tibia and the proximal tibia is prepared with the modular drill and keel punch for that size.      The femoral sizing guide is placed and size 5 is most appropriate. Rotation is marked off the epicondylar axis and confirmed by creating a rectangular flexion gap at 90 degrees. The size 5 cutting block is pinned in this rotation and the anterior, posterior and chamfer cuts are made with the oscillating saw. The intercondylar block is then placed and that cut is made.      Trial size 4 tibial  component, trial size 5 posterior stabilized femur and a 12.5  mm posterior stabilized rotating platform insert trial is placed. Full extension is achieved with excellent varus/valgus and anterior/posterior balance throughout full range of motion. The patella is everted and thickness measured to be 27  mm. Free hand resection is taken to 15 mm, a 41 template is placed, lug holes are drilled, trial patella is placed, and  it tracks normally. Osteophytes are removed off the posterior femur with the trial in place. All trials are removed and the cut bone surfaces prepared with pulsatile lavage. Cement is mixed and once ready for implantation, the size 4 tibial implant, size  5 posterior stabilized femoral component, and the size 41 patella are cemented in place and the patella is held with the clamp. The trial insert is placed and the knee held in full extension. All extruded cement is removed and once the cement is hard the permanent 12.5 mm posterior stabilized rotating platform insert is placed into the tibial tray.      The wound is copiously irrigated with saline solution and the extensor mechanism closed over a hemovac drain with #1 PDS suture. The tourniquet is released for a total tourniquet time of 32  minutes. Flexion against gravity is 140 degrees and the patella tracks normally. Subcutaneous tissue is closed with 2.0 vicryl and subcuticular with running 4.0 Monocryl. The catheter for the Marcaine pain pump is placed and the pump is initiated. The incision is cleaned and dried and steri-strips and a bulky sterile dressing are applied. The limb is placed into a knee immobilizer and the patient is awakened and transported to recovery in stable condition.      Please note that a surgical assistant was a medical necessity for this procedure in order to perform it in a safe and expeditious manner. Surgical assistant was necessary to retract the ligaments and vital neurovascular structures to prevent injury to them and also necessary for proper positioning of the limb to allow for anatomic placement of the prosthesis.   Ricardo Rankin Ricardo Woldt, MD    10/26/2011, 2:55 PM

## 2011-10-26 NOTE — Interval H&P Note (Signed)
History and Physical Interval Note:  10/26/2011 1:29 PM  Ricardo Perkins  has presented today for surgery, with the diagnosis of Osteoarthritis of the Right Knee  The various methods of treatment have been discussed with the patient and family. After consideration of risks, benefits and other options for treatment, the patient has consented to  Procedure(s) (LRB): TOTAL KNEE ARTHROPLASTY (Right) as a surgical intervention .  The patients' history has been reviewed, patient examined, no change in status, stable for surgery.  I have reviewed the patients' chart and labs.  Questions were answered to the patient's satisfaction.     Loanne Drilling

## 2011-10-26 NOTE — Anesthesia Postprocedure Evaluation (Signed)
  Anesthesia Post-op Note  Patient: Ricardo Perkins  Procedure(s) Performed: Procedure(s) (LRB): TOTAL KNEE ARTHROPLASTY (Right)  Patient Location: PACU  Anesthesia Type: Spinal  Level of Consciousness: oriented and sedated  Airway and Oxygen Therapy: Patient Spontanous Breathing and Patient connected to nasal cannula oxygen  Post-op Pain: mild  Post-op Assessment: Post-op Vital signs reviewed, Patient's Cardiovascular Status Stable, Respiratory Function Stable and Patent Airway  Post-op Vital Signs: stable  Complications: No apparent anesthesia complications

## 2011-10-26 NOTE — Transfer of Care (Signed)
Immediate Anesthesia Transfer of Care Note  Patient: Ricardo Perkins  Procedure(s) Performed: Procedure(s) (LRB): TOTAL KNEE ARTHROPLASTY (Right)  Patient Location: PACU  Anesthesia Type: Spinal  Level of Consciousness: awake and alert   Airway & Oxygen Therapy: Patient Spontanous Breathing and Patient connected to face mask oxygen  Post-op Assessment: Report given to PACU RN and Post -op Vital signs reviewed and stable  Post vital signs: Reviewed and stable  Complications: No apparent anesthesia complications

## 2011-10-26 NOTE — Progress Notes (Signed)
ANTICOAGULATION CONSULT NOTE - Initial Consult  Pharmacy Consult for Warfarin Indication: VTE prophylaxis and atrial fibrillation  No Known Allergies  Patient Measurements:     Vital Signs: Temp: 97.2 F (36.2 C) (04/08 1625) Temp src: Oral (04/08 1120) BP: 144/70 mmHg (04/08 1625) Pulse Rate: 51  (04/08 1625)  Labs: No results found for this basename: HGB:2,HCT:3,PLT:3,APTT:3,LABPROT:3,INR:3,HEPARINUNFRC:3,CREATININE:3,CKTOTAL:3,CKMB:3,TROPONINI:3 in the last 72 hours The CrCl is unknown because both a height and weight (above a minimum accepted value) are required for this calculation.  Medical History: Past Medical History  Diagnosis Date  . INSOMNIA, PERSISTENT 08/22/2008  . DECREASED HEARING 10/26/2007  . HYPERTENSION 07/23/2008  . BRADYCARDIA 04/01/2009  . C O P D 10/26/2007  . RENAL INSUFFICIENCY 04/01/2009  . BENIGN PROSTATIC HYPERTROPHY, WITH OBSTRUCTION 11/21/2007  . ASTEATOTIC ECZEMA 03/29/2008  . INSOMNIA UNSPECIFIED 11/21/2007  . LEG EDEMA, BILATERAL 01/07/2010  . PAROXYSMAL ATRIAL FIBRILLATION 11/21/2007  . Arthritis     knees, shoulders     Assessment:  89yom on chronic coumadin therapy for atrial fibrillation PTA.    Warfarin was held for right TKA.  Now patient is post-op and will restart warfarin for VTE prophylaxis as well as chronic afib.  Home dose: 3 mg po daily except 1.5 mg on Thursdays and is followed by [facility].  Will start with slightly higher dose tonight as we reinitiate warfarin.  Goal of Therapy:  INR 2-3   Plan:  Warfarin 5mg  po once today. Follow daily PT/INR.  Clance Boll 10/26/2011,4:49 PM

## 2011-10-26 NOTE — Anesthesia Procedure Notes (Signed)
Spinal  Patient location during procedure: OR Start time: 10/26/2011 1:36 PM End time: 10/26/2011 1:46 PM Preanesthetic Checklist Completed: patient identified, site marked, surgical consent, pre-op evaluation, timeout performed, IV checked, risks and benefits discussed and monitors and equipment checked Spinal Block Patient position: right lateral decubitus Prep: Betadine and site prepped and draped Patient monitoring: heart rate, cardiac monitor, continuous pulse ox and blood pressure Location: L3-4 Injection technique: single-shot Needle Needle type: Quincke  Needle gauge: 22 G Needle length: 10 cm Needle insertion depth: 3 cm Assessment Sensory level: T6 Additional Notes [id number], 2014-09

## 2011-10-26 NOTE — Anesthesia Preprocedure Evaluation (Signed)
Anesthesia Evaluation  Patient identified by MRN, date of birth, ID band Patient awake  General Assessment Comment:Advanced yrs  Reviewed: Allergy & Precautions, H&P , NPO status , Patient's Chart, lab work & pertinent test results, reviewed documented beta blocker date and time   Airway Mallampati: II TM Distance: >3 FB Neck ROM: Full    Dental  (+) Upper Dentures and Lower Dentures   Pulmonary COPDformer smoker breath sounds clear to auscultation  + decreased breath sounds      Cardiovascular hypertension, Pt. on medications + dysrhythmias Atrial Fibrillation Rhythm:Regular Rate:Normal  Denies CP/hx MI   Neuro/Psych negative neurological ROS  negative psych ROS   GI/Hepatic negative GI ROS, Neg liver ROS,   Endo/Other  negative endocrine ROS  Renal/GU negative Renal ROS  negative genitourinary   Musculoskeletal   Abdominal   Peds negative pediatric ROS (+)  Hematology negative hematology ROS (+)   Anesthesia Other Findings   Reproductive/Obstetrics negative OB ROS                           Anesthesia Physical Anesthesia Plan  ASA: III  Anesthesia Plan: Spinal   Post-op Pain Management:    Induction: Intravenous  Airway Management Planned: Mask  Additional Equipment:   Intra-op Plan:   Post-operative Plan:   Informed Consent: I have reviewed the patients History and Physical, chart, labs and discussed the procedure including the risks, benefits and alternatives for the proposed anesthesia with the patient or authorized representative who has indicated his/her understanding and acceptance.     Plan Discussed with: CRNA and Surgeon  Anesthesia Plan Comments:         Anesthesia Quick Evaluation

## 2011-10-27 LAB — CBC
Platelets: 173 10*3/uL (ref 150–400)
RBC: 4.48 MIL/uL (ref 4.22–5.81)
WBC: 12.7 10*3/uL — ABNORMAL HIGH (ref 4.0–10.5)

## 2011-10-27 LAB — BASIC METABOLIC PANEL
CO2: 23 mEq/L (ref 19–32)
Chloride: 102 mEq/L (ref 96–112)
Sodium: 135 mEq/L (ref 135–145)

## 2011-10-27 LAB — PROTIME-INR
INR: 1.29 (ref 0.00–1.49)
Prothrombin Time: 16.3 seconds — ABNORMAL HIGH (ref 11.6–15.2)

## 2011-10-27 MED ORDER — WARFARIN SODIUM 3 MG PO TABS
3.0000 mg | ORAL_TABLET | Freq: Once | ORAL | Status: AC
  Administered 2011-10-27: 3 mg via ORAL
  Filled 2011-10-27: qty 1

## 2011-10-27 MED ORDER — OMEPRAZOLE 20 MG PO CPDR
20.0000 mg | DELAYED_RELEASE_CAPSULE | Freq: Every day | ORAL | Status: DC
  Administered 2011-10-27 – 2011-10-31 (×5): 20 mg via ORAL
  Filled 2011-10-27 (×5): qty 1

## 2011-10-27 MED ORDER — NON FORMULARY: 20.0000 mg | Freq: Every day | Status: DC

## 2011-10-27 NOTE — Evaluation (Signed)
Physical Therapy Evaluation Patient Details Name: Ricardo Perkins MRN: [id number] DOB: [birth year] Today's Date: 10/27/2011  Problem List:  Patient Active Problem List  Diagnoses  . INSOMNIA, PERSISTENT  . DECREASED HEARING  . HYPERTENSION  . BRADYCARDIA  . RHINITIS, VASOMOTOR  . C O P D  . OTHER DENTAL CARIES  . RENAL INSUFFICIENCY  . BENIGN PROSTATIC HYPERTROPHY, WITH OBSTRUCTION  . ASTEATOTIC ECZEMA  . LEG EDEMA, BILATERAL  . PAROXYSMAL ATRIAL FIBRILLATION  . Large bilateral inguinal hernias - chronic, containing small bowel (right) and sigmoid colon (left)  . Inguinal hernia, bilateral  . Pre-operative cardiovascular examination  . DJD (degenerative joint disease) of knee  . OA (osteoarthritis) of knee    Past Medical History:  Past Medical History  Diagnosis Date  . INSOMNIA, PERSISTENT 08/22/2008  . DECREASED HEARING 10/26/2007  . HYPERTENSION 07/23/2008  . BRADYCARDIA 04/01/2009  . C O P D 10/26/2007  . RENAL INSUFFICIENCY 04/01/2009  . BENIGN PROSTATIC HYPERTROPHY, WITH OBSTRUCTION 11/21/2007  . ASTEATOTIC ECZEMA 03/29/2008  . INSOMNIA UNSPECIFIED 11/21/2007  . LEG EDEMA, BILATERAL 01/07/2010  . PAROXYSMAL ATRIAL FIBRILLATION 11/21/2007  . Arthritis     knees, shoulders    Past Surgical History:  Past Surgical History  Procedure Date  . Appendectomy     @ 23  . Tonsillectomy   . Hernia repair 01/2011    bilateral inguinal hernia   . Eye surgery     bilateral cataract surgery   . Other surgical history     left finger surgery     PT Assessment/Plan/Recommendation PT Assessment Clinical Impression Statement: Pt with R TKR presents with decreased R LE strength/ROm and limited functional mobility PT Recommendation/Assessment: Patient will need skilled PT in the acute care venue PT Problem List: Decreased strength;Decreased range of motion;Decreased activity tolerance;Decreased mobility;Decreased knowledge of use of DME;Pain;Decreased knowledge of precautions PT Therapy  Diagnosis : Difficulty walking PT Plan PT Frequency: 7X/week PT Treatment/Interventions: DME instruction;Gait training;Stair training;Functional mobility training;Therapeutic activities;Therapeutic exercise;Patient/family education PT Recommendation Recommendations for Other Services: OT consult Follow Up Recommendations: Home health PT Equipment Recommended: Rolling walker with 5" wheels PT Goals  Acute Rehab PT Goals PT Goal Formulation: With patient Time For Goal Achievement: 7 days Pt will go Supine/Side to Sit: with supervision PT Goal: Supine/Side to Sit - Progress: Goal set today Pt will go Sit to Supine/Side: with supervision PT Goal: Sit to Supine/Side - Progress: Goal set today Pt will go Sit to Stand: with supervision PT Goal: Sit to Stand - Progress: Goal set today Pt will go Stand to Sit: with supervision PT Goal: Stand to Sit - Progress: Goal set today Pt will Ambulate: 51 - 150 feet;with supervision;with rolling walker PT Goal: Ambulate - Progress: Goal set today Pt will Go Up / Down Stairs: 3-5 stairs;with min assist;with least restrictive assistive device PT Goal: Up/Down Stairs - Progress: Goal set today  PT Evaluation Precautions/Restrictions  Precautions Precautions: Knee Required Braces or Orthoses: Yes Knee Immobilizer: Discontinue once straight leg raise with < 10 degree lag Restrictions RLE Weight Bearing: Weight bearing as tolerated Prior Functioning  Home Living Lives With: Alone (will be going to dtr's home) Receives Help From: Family Type of Home: House Home Layout: One level Home Access: Stairs to enter Entrance Stairs-Rails: Left Entrance Stairs-Number of Steps: 4 Prior Function Level of Independence: Independent with basic ADLs;Independent with gait;Independent with transfers Able to Take Stairs?: Yes Cognition Cognition Arousal/Alertness: Awake/alert Overall Cognitive Status: Appears within functional limits for tasks  assessed Orientation Level: Oriented X4 Sensation/Coordination Sensation Light Touch: Appears Intact Coordination Gross Motor Movements are Fluid and Coordinated: Yes Extremity Assessment RUE Assessment RUE Assessment: Within Functional Limits LUE Assessment LUE Assessment: Within Functional Limits RLE Assessment RLE Assessment: Exceptions to WFL (AAROM at knee -10 - 45; 2/5 quads) LLE Assessment LLE Assessment: Within Functional Limits Mobility (including Balance) Bed Mobility Bed Mobility: Yes Supine to Sit: 3: Mod assist Supine to Sit Details (indicate cue type and reason): cues for sequence and use of UEs to self assist Transfers Transfers: Yes Sit to Stand: 3: Mod assist Sit to Stand Details (indicate cue type and reason): cues for use of UEs and for LE management Stand to Sit: 3: Mod assist Stand to Sit Details: cues for use of UEs and for LE management Ambulation/Gait Ambulation/Gait: Yes Ambulation/Gait Assistance: 3: Mod assist Ambulation/Gait Assistance Details (indicate cue type and reason): cues for sequence, posture and position from RW Ambulation Distance (Feet): 32 Feet Assistive device: Rolling walker Gait Pattern: Step-to pattern    Exercise  Total Joint Exercises Ankle Circles/Pumps: AROM;10 reps;Supine;Both Quad Sets: AROM;10 reps;Supine;Both Heel Slides: AAROM;10 reps;Supine;Right Straight Leg Raises: AAROM;10 reps;Right;Supine End of Session PT - End of Session Equipment Utilized During Treatment: Gait belt;Right knee immobilizer Activity Tolerance: Patient tolerated treatment well Patient left: in chair;with call bell in reach;with family/visitor present Nurse Communication: Mobility status for transfers;Mobility status for ambulation General Behavior During Session: [facility] for tasks performed Cognition: [facility] for tasks performed  Breeze Angell 10/27/2011, 12:40 PM

## 2011-10-27 NOTE — Progress Notes (Signed)
ANTICOAGULATION CONSULT NOTE - Follow Up Consult  Pharmacy Consult for Coumadin Indication: atrial fibrillation and VTE prophylaxis  No Known Allergies  Patient Measurements: Height: 5\' 8"  (172.7 cm) Weight: 188 lb (85.276 kg) IBW/kg (Calculated) : 68.4    Vital Signs: Temp: 97.6 F (36.4 C) (04/09 0536) Temp src: Oral (04/09 0536) BP: 121/72 mmHg (04/09 0841) Pulse Rate: 70  (04/09 0536)  Labs:  Basename 10/27/11 0427  HGB 13.4  HCT 41.0  PLT 173  APTT --  LABPROT 16.3*  INR 1.29  HEPARINUNFRC --  CREATININE 1.05  CKTOTAL --  CKMB --  TROPONINI --   Estimated Creatinine Clearance: 52.7 ml/min (by C-G formula based on Cr of 1.05).   Medications:  Scheduled:    . acetaminophen  1,000 mg Intravenous Q6H  . amLODipine  5 mg Oral QPC breakfast  . atenolol  25 mg Oral BID  .  ceFAZolin (ANCEF) IV  1 g Intravenous Q6H  .  ceFAZolin (ANCEF) IV  2 g Intravenous Once  . dexamethasone  10 mg Intravenous Once  . docusate sodium  100 mg Oral BID  . enoxaparin  30 mg Subcutaneous Q12H  . finasteride  5 mg Oral Daily  . morphine      . omeprazole  20 mg Oral Q1200  . Tamsulosin HCl  0.4 mg Oral BID  . warfarin  5 mg Oral ONCE-1800  . Warfarin - Pharmacist Dosing Inpatient   Does not apply q1800  . DISCONTD: acetaminophen  1,000 mg Intravenous Once  . DISCONTD: chlorhexidine  60 mL Topical Once  . DISCONTD: NON FORMULARY 20 mg  20 mg Oral Daily  . DISCONTD: pantoprazole  40 mg Oral Q1200    Assessment:  89yom on chronic coumadin therapy for atrial fibrillation PTA.   Warfarin was held for right TKA. Now patient is post-op and will restart warfarin for VTE prophylaxis as well as chronic afib.   Home dose: 3 mg po daily except 1.5 mg on Thursdays and is followed by [facility] anticoagulation clinic.  INR 1.29 today after coumadin 5mg  last pm  Goal of Therapy:  INR 2-3   Plan:   Coumadin 3mg  today  Lovenox 30mg  q12h until INR >/= 1.8 per MD  Daily  PT/INR   Loralee Pacas, PharmD, BCPS Pager: [phone] 10/27/2011,9:41 AM

## 2011-10-27 NOTE — Progress Notes (Signed)
Utilization review completed.  

## 2011-10-27 NOTE — Progress Notes (Signed)
CARE MANAGEMENT NOTE 10/27/2011  Patient:  Ricardo Perkins, Ricardo Perkins   Account Number:  [id number]  Date Initiated:  10/27/2011  Documentation initiated by:  Colleen Can  Subjective/Objective Assessment:   dx osteoaarthritis right knee; total knee replacemnt     Action/Plan:   Cm spoke with patient. Plans to go home with her daughter after discharge. Daughter will be caregiver.Pt does not currently have daughter's physical address but will obtain   Anticipated DC Date:  10/28/2011   Anticipated DC Plan:  HOME W HOME HEALTH SERVICES  In-house referral  Clinical Social Worker      DC Associate Professor  CM consult      [facility] Choice  HOME HEALTH  DURABLE MEDICAL EQUIPMENT   Choice offered to / List presented to:  C-1 Patient      DME agency  [facility]     [facility] arranged  HH-2 PT  HH-1 RN      [facility] agency  [facility]   Status of service:  In process, will continue to follow Medicare Important Message given?   (If response is "NO", the following Medicare IM given date fields will be blank) Comments:  10/26/2011 Ricardo Perkins BSN CCM [phone] [facility] choice offered. Pt requested Ricardo Perkins for [facility] services. [facility] was notified of request for RW. List of agencies placed in shadow chart. CM will follow

## 2011-10-27 NOTE — Progress Notes (Signed)
Subjective: 1 Day Post-Op Procedure(s) (LRB): TOTAL KNEE ARTHROPLASTY (Right) Patient reports pain as mild.  Asking for his Restoril. Patient seen in rounds with Dr. Lequita Halt. Patient has complaints of only mild pain We will start therapy today. Plan is to go home after hospital stay.  Objective: Vital signs in last 24 hours: Temp:  [97.2 F (36.2 C)-98.3 F (36.8 C)] 97.6 F (36.4 C) (04/09 0536) Pulse Rate:  [44-70] 70  (04/09 0536) Resp:  [10-18] 16  (04/09 0536) BP: (112-161)/(55-91) 119/80 mmHg (04/09 0536) SpO2:  [91 %-100 %] 93 % (04/09 0536) Weight:  [85.276 kg (188 lb)] 85.276 kg (188 lb) (04/08 1625)  Intake/Output from previous day:  Intake/Output Summary (Last 24 hours) at 10/27/11 0720 Last data filed at 10/27/11 0653  Gross per 24 hour  Intake   2455 ml  Output   2485 ml  Net    -30 ml    Intake/Output this shift:    Labs:  Basename 10/27/11 0427  HGB 13.4    Basename 10/27/11 0427  WBC 12.7*  RBC 4.48  HCT 41.0  PLT 173    Basename 10/27/11 0427  NA 135  K 4.4  CL 102  CO2 23  BUN 22  CREATININE 1.05  GLUCOSE 150*  CALCIUM 8.6    Basename 10/27/11 0427  LABPT --  INR 1.29    Exam - Neurovascular intact Sensation intact distally Dressing - clean, dry, no drainage Motor function intact - moving foot and toes well on exam.  Hemovac pulled without difficulty.  Past Medical History  Diagnosis Date  . INSOMNIA, PERSISTENT 08/22/2008  . DECREASED HEARING 10/26/2007  . HYPERTENSION 07/23/2008  . BRADYCARDIA 04/01/2009  . C O P D 10/26/2007  . RENAL INSUFFICIENCY 04/01/2009  . BENIGN PROSTATIC HYPERTROPHY, WITH OBSTRUCTION 11/21/2007  . ASTEATOTIC ECZEMA 03/29/2008  . INSOMNIA UNSPECIFIED 11/21/2007  . LEG EDEMA, BILATERAL 01/07/2010  . PAROXYSMAL ATRIAL FIBRILLATION 11/21/2007  . Arthritis     knees, shoulders     Assessment/Plan: 1 Day Post-Op Procedure(s) (LRB): TOTAL KNEE ARTHROPLASTY (Right) Principal Problem:  *OA (osteoarthritis) of  knee   Advance diet Up with therapy Continue foley due to strict I&O and urinary output monitoring Discharge home with home health  DVT Prophylaxis - Coumadin Protocol Weight-Bearing as tolerated to right leg Keep foley until tomorrow. No vaccines. D/C O2 and try on Room [address]  Patrica Duel 10/27/2011, 7:20 AM

## 2011-10-27 NOTE — Evaluation (Signed)
Occupational Therapy Evaluation Patient Details Name: Ricardo Perkins MRN: [id number] DOB: 24-Dec-1922 Today's Date: 10/27/2011  Problem List:  Patient Active Problem List  Diagnoses  . INSOMNIA, PERSISTENT  . DECREASED HEARING  . HYPERTENSION  . BRADYCARDIA  . RHINITIS, VASOMOTOR  . C O P D  . OTHER DENTAL CARIES  . RENAL INSUFFICIENCY  . BENIGN PROSTATIC HYPERTROPHY, WITH OBSTRUCTION  . ASTEATOTIC ECZEMA  . LEG EDEMA, BILATERAL  . PAROXYSMAL ATRIAL FIBRILLATION  . Large bilateral inguinal hernias - chronic, containing small bowel (right) and sigmoid colon (left)  . Inguinal hernia, bilateral  . Pre-operative cardiovascular examination  . DJD (degenerative joint disease) of knee  . OA (osteoarthritis) of knee    Past Medical History:  Past Medical History  Diagnosis Date  . INSOMNIA, PERSISTENT 08/22/2008  . DECREASED HEARING 10/26/2007  . HYPERTENSION 07/23/2008  . BRADYCARDIA 04/01/2009  . C O P D 10/26/2007  . RENAL INSUFFICIENCY 04/01/2009  . BENIGN PROSTATIC HYPERTROPHY, WITH OBSTRUCTION 11/21/2007  . ASTEATOTIC ECZEMA 03/29/2008  . INSOMNIA UNSPECIFIED 11/21/2007  . LEG EDEMA, BILATERAL 01/07/2010  . PAROXYSMAL ATRIAL FIBRILLATION 11/21/2007  . Arthritis     knees, shoulders    Past Surgical History:  Past Surgical History  Procedure Date  . Appendectomy     @ 23  . Tonsillectomy   . Hernia repair 01/2011    bilateral inguinal hernia   . Eye surgery     bilateral cataract surgery   . Other surgical history     left finger surgery     OT Assessment/Plan/Recommendation OT Assessment Clinical Impression Statement: Pt presents with a R TKR POD 1. Doing well and will be staying with his daughter upon d/c. Skilled OT recommended to maximize I w/BADLs to supervision/min A level inprep for safe d/c home with prn A and HHOT. OT Recommendation/Assessment: Patient will need skilled OT in the acute care venue OT Problem List: Decreased activity tolerance;Decreased safety  awareness;Decreased knowledge of use of DME or AE;Impaired balance (sitting and/or standing);Pain OT Therapy Diagnosis : Generalized weakness OT Plan OT Frequency: Min 2X/week OT Treatment/Interventions: Self-care/ADL training;Therapeutic activities;DME and/or AE instruction;Patient/family education OT Recommendation Follow Up Recommendations: Home health OT Equipment Recommended: Rolling walker with 5" wheels;3 in 1 bedside comode Individuals Consulted Consulted and Agree with Results and Recommendations: Patient;Family member/caregiver Family Member Consulted: daughter OT Goals Acute Rehab OT Goals OT Goal Formulation: With patient/family Time For Goal Achievement: 2 weeks ADL Goals Pt Will Perform Grooming: with supervision;Standing at sink (X 2 tasks to improve standing activity tolerance.) ADL Goal: Grooming - Progress: Goal set today Pt Will Transfer to Toilet: with supervision;Ambulation;3-in-1;Comfort height toilet ADL Goal: Toilet Transfer - Progress: Goal set today Pt Will Perform Toileting - Clothing Manipulation: with supervision;Standing ADL Goal: Toileting - Clothing Manipulation - Progress: Goal set today Pt Will Perform Toileting - Hygiene: with supervision;Sit to stand from 3-in-1/toilet ADL Goal: Toileting - Hygiene - Progress: Goal set today Pt Will Perform Tub/Shower Transfer: Shower transfer;with min assist;Ambulation ADL Goal: Web designer - Progress: Goal set today  OT Evaluation Precautions/Restrictions  Precautions Precautions: Knee Required Braces or Orthoses: Yes Knee Immobilizer: Discontinue once straight leg raise with < 10 degree lag Restrictions Weight Bearing Restrictions: No RLE Weight Bearing: Weight bearing as tolerated Prior Functioning Home Living Lives With: Alone (staying with daughter at d/c) Type of Home: House Home Layout: One level Home Access: Stairs to enter Entrance Stairs-Rails: Left Entrance Stairs-Number of Steps:  4 Bathroom Shower/Tub: Health visitor:  Handicapped height (vanity beside) Home Adaptive Equipment: Built-in shower seat Prior Function Level of Independence: Independent with basic ADLs;Independent with transfers;Independent with gait;Independent with homemaking with ambulation Driving: Yes Vocation: Retired ADL ADL Grooming: Simulated;Set up Where Assessed - Grooming: Sitting, bed;Unsupported Upper Body Bathing: Simulated;Set up Where Assessed - Upper Body Bathing: Sitting, bed;Unsupported Lower Body Bathing: Simulated;Moderate assistance Where Assessed - Lower Body Bathing: Sit to stand from bed Upper Body Dressing: Simulated;Set up Where Assessed - Upper Body Dressing: Sitting, bed;Unsupported Lower Body Dressing: Simulated;Moderate assistance Where Assessed - Lower Body Dressing: Sit to stand from bed Toilet Transfer: Simulated;Moderate assistance Toilet Transfer Details (indicate cue type and reason): cues for technique, posture and hand placement. Toilet Transfer Method: Stand pivot Toileting - Clothing Manipulation: Simulated;Moderate assistance Where Assessed - Toileting Clothing Manipulation: Sit to stand from 3-in-1 or toilet Toileting - Hygiene: Simulated;Moderate assistance Where Assessed - Toileting Hygiene: Sit to stand from 3-in-1 or toilet Tub/Shower Transfer: Not assessed Tub/Shower Transfer Method: Not assessed Equipment Used: Rolling walker ADL Comments: Pt fatigues very quickly. 1/4 dyspnea at rest, 2/4 dyspnea upon exertion. O2 saturation dropped to 88 on RA, increased to 92 after PLB, rest. O2 reapplied. Vision/Perception    Cognition Cognition Arousal/Alertness: Awake/alert Overall Cognitive Status: Appears within functional limits for tasks assessed Orientation Level: Oriented X4 Sensation/Coordination   Extremity Assessment RUE Assessment RUE Assessment: Within Functional Limits LUE Assessment LUE Assessment: Within Functional  Limits Mobility  Bed Mobility Supine to Sit: 4: Min assist;3: Mod assist Supine to Sit Details (indicate cue type and reason): cues for techniqe and hand placement to boost self to EOB. Transfers Sit to Stand: 4: Min assist;3: Mod assist;From elevated surface;With upper extremity assist;From bed Sit to Stand Details (indicate cue type and reason): cues for hand placement and LE position. Stand to Sit Details: cues for hand placement and LE position. Exercises   End of Session OT - End of Session Equipment Utilized During Treatment: Gait belt Activity Tolerance: Patient limited by fatigue Patient left: in chair;with call bell in reach;with family/visitor present General Behavior During Session: [facility] for tasks performed Cognition: [facility] for tasks performed   Hafsa Lohn A OTR/L [phone] 10/27/2011, 2:25 PM

## 2011-10-28 LAB — CBC
HCT: 34.2 % — ABNORMAL LOW (ref 39.0–52.0)
Hemoglobin: 11 g/dL — ABNORMAL LOW (ref 13.0–17.0)
RBC: 3.76 MIL/uL — ABNORMAL LOW (ref 4.22–5.81)

## 2011-10-28 LAB — BASIC METABOLIC PANEL
CO2: 26 mEq/L (ref 19–32)
Chloride: 103 mEq/L (ref 96–112)
Glucose, Bld: 129 mg/dL — ABNORMAL HIGH (ref 70–99)
Potassium: 4.2 mEq/L (ref 3.5–5.1)
Sodium: 137 mEq/L (ref 135–145)

## 2011-10-28 LAB — PROTIME-INR: INR: 1.49 (ref 0.00–1.49)

## 2011-10-28 MED ORDER — SODIUM CHLORIDE 0.9 % IV SOLN: Freq: Once | INTRAVENOUS | Status: DC

## 2011-10-28 MED ORDER — WARFARIN SODIUM 3 MG PO TABS
3.0000 mg | ORAL_TABLET | Freq: Once | ORAL | Status: AC
  Administered 2011-10-28: 3 mg via ORAL
  Filled 2011-10-28: qty 1

## 2011-10-28 NOTE — Progress Notes (Signed)
Patient laying in bed watching tv and states he doesn't feel any dizziness while laying down. BP now 116/70 no acute distress noted.

## 2011-10-28 NOTE — Progress Notes (Signed)
Subjective: 2 Days Post-Op Procedure(s) (LRB): TOTAL KNEE ARTHROPLASTY (Right) Patient reports pain as mild.   Patient seen in rounds with Dr. Lequita Halt. Patient doing fairly well. Plan is for him to go home.  Objective: Vital signs in last 24 hours: Temp:  [97.7 F (36.5 C)-98.9 F (37.2 C)] 98.9 F (37.2 C) (04/10 0600) Pulse Rate:  [64-70] 70  (04/10 0600) Resp:  [16] 16  (04/10 0600) BP: (104-126)/(57-76) 126/76 mmHg (04/10 0600) SpO2:  [66 %-95 %] 95 % (04/10 0600)  Intake/Output from previous day:  Intake/Output Summary (Last 24 hours) at 10/28/11 1042 Last data filed at 10/28/11 0603  Gross per 24 hour  Intake 2605.42 ml  Output   2425 ml  Net 180.42 ml    Intake/Output this shift:    Labs:  Basename 10/28/11 0415 10/27/11 0427  HGB 11.0* 13.4    Basename 10/28/11 0415 10/27/11 0427  WBC 7.1 12.7*  RBC 3.76* 4.48  HCT 34.2* 41.0  PLT 144* 173    Basename 10/28/11 0415 10/27/11 0427  NA 137 135  K 4.2 4.4  CL 103 102  CO2 26 23  BUN 26* 22  CREATININE 1.27 1.05  GLUCOSE 129* 150*  CALCIUM 8.5 8.6    Basename 10/28/11 0415 10/27/11 0427  LABPT -- --  INR 1.49 1.29    Exam - Neurovascular intact Sensation intact distally Dressing/Incision - clean, dry, no drainage Motor function intact - moving foot and toes well on exam.   Past Medical History  Diagnosis Date  . INSOMNIA, PERSISTENT 08/22/2008  . DECREASED HEARING 10/26/2007  . HYPERTENSION 07/23/2008  . BRADYCARDIA 04/01/2009  . C O P D 10/26/2007  . RENAL INSUFFICIENCY 04/01/2009  . BENIGN PROSTATIC HYPERTROPHY, WITH OBSTRUCTION 11/21/2007  . ASTEATOTIC ECZEMA 03/29/2008  . INSOMNIA UNSPECIFIED 11/21/2007  . LEG EDEMA, BILATERAL 01/07/2010  . PAROXYSMAL ATRIAL FIBRILLATION 11/21/2007  . Arthritis     knees, shoulders     Assessment/Plan: 2 Days Post-Op Procedure(s) (LRB): TOTAL KNEE ARTHROPLASTY (Right) Principal Problem:  *OA (osteoarthritis) of knee   Up with therapy Plan for discharge  tomorrow Discharge home with home health  DVT Prophylaxis - Coumadin Weight-Bearing as tolerated to right leg  Sabriya Yono 10/28/2011, 10:42 AM

## 2011-10-28 NOTE — Progress Notes (Signed)
ANTICOAGULATION CONSULT NOTE - Follow Up Consult  Pharmacy Consult for Coumadin Indication: atrial fibrillation and VTE prophylaxis  No Known Allergies  Patient Measurements: Height: 5\' 8"  (172.7 cm) Weight: 188 lb (85.276 kg) IBW/kg (Calculated) : 68.4    Vital Signs: Temp: 98.9 F (37.2 C) (04/10 0600) BP: 126/76 mmHg (04/10 0600) Pulse Rate: 70  (04/10 0600)  Labs:  Basename 10/28/11 0415 10/27/11 0427  HGB 11.0* 13.4  HCT 34.2* 41.0  PLT 144* 173  APTT -- --  LABPROT 18.3* 16.3*  INR 1.49 1.29  HEPARINUNFRC -- --  CREATININE 1.27 1.05  CKTOTAL -- --  CKMB -- --  TROPONINI -- --   Estimated Creatinine Clearance: 43.6 ml/min (by C-G formula based on Cr of 1.27).   Medications:  Scheduled:     . acetaminophen  1,000 mg Intravenous Q6H  . amLODipine  5 mg Oral QPC breakfast  . atenolol  25 mg Oral BID  . docusate sodium  100 mg Oral BID  . enoxaparin  30 mg Subcutaneous Q12H  . finasteride  5 mg Oral Daily  . omeprazole  20 mg Oral Q1200  . Tamsulosin HCl  0.4 mg Oral BID  . warfarin  3 mg Oral ONCE-1800  . Warfarin - Pharmacist Dosing Inpatient   Does not apply q1800    Assessment:  89yom on chronic coumadin therapy for atrial fibrillation PTA.   Warfarin was held for right TKA. Now patient is post-op and will restart warfarin for VTE prophylaxis as well as chronic afib.   Home dose: 3 mg po daily except 1.5 mg on Thursdays and is followed by [facility].  INR moving back toward goal range.   Goal of Therapy:  INR 2-3   Plan:   Coumadin 3mg  today  Lovenox 30mg  q12h until INR >/= 1.8 per MD  Daily PT/INR  Oluwademilade Kellett, Loma Messing PharmD 10:25 AM 10/28/2011

## 2011-10-28 NOTE — Progress Notes (Signed)
Patient was ambulating with PT when he had c/o of feeling lightheaded and dizzy. BP taken noted at 70/60 while standing. When laying down BP noted at 117/70 and he states that he feels somewhat better. MD paged and advised to give bolus of NS and monitor blood pressure.

## 2011-10-28 NOTE — Progress Notes (Signed)
Physical Therapy Treatment Patient Details Name: Ricardo Perkins MRN: [id number] DOB: 18-Apr-1923 Today's Date: 10/28/2011  PT Assessment/Plan  PT - Assessment/Plan Comments on Treatment Session: Pt unable to balance with standing and c/o mild dizziness - RN alerated and manual BP at 72/60.  Pt returned to bed with assist x 2.  RN to contact physician. PT Plan: Discharge plan remains appropriate PT Frequency: 7X/week Recommendations for Other Services: OT consult Follow Up Recommendations: Home health PT Equipment Recommended: Rolling walker with 5" wheels;3 in 1 bedside comode PT Goals  Acute Rehab PT Goals PT Goal Formulation: With patient Time For Goal Achievement: 7 days Pt will go Supine/Side to Sit: with supervision PT Goal: Supine/Side to Sit - Progress: Progressing toward goal Pt will go Sit to Supine/Side: with supervision Pt will go Sit to Stand: with supervision PT Goal: Sit to Stand - Progress: Progressing toward goal Pt will go Stand to Sit: with supervision PT Goal: Stand to Sit - Progress: Not progressing Pt will Ambulate: 51 - 150 feet;with supervision;with rolling walker PT Goal: Ambulate - Progress: Not progressing  PT Treatment Precautions/Restrictions  Precautions Precautions: Knee Required Braces or Orthoses: Yes Knee Immobilizer: Discontinue once straight leg raise with < 10 degree lag Restrictions Weight Bearing Restrictions: No RLE Weight Bearing: Weight bearing as tolerated Mobility (including Balance) Bed Mobility Supine to Sit: 4: Min assist;3: Mod assist Supine to Sit Details (indicate cue type and reason): cues for sequence and use of UEs to self assist Sit to Supine: 1: +2 Total assist Sit to Supine - Details (indicate cue type and reason): cues for sequence and increased assist of 2 2* BP @ 72/60 Transfers Sit to Stand: 3: Mod assist Sit to Stand Details (indicate cue type and reason): cues for use of UEs and for LE management Stand to Sit: 3:  Mod assist Stand to Sit Details: cues for use of UEs and for LE management Ambulation/Gait Ambulation/Gait:  (Stood only - unable to balance and with c/o mild diz)    Exercise  Total Joint Exercises Ankle Circles/Pumps: AROM;20 reps;Supine;Both Quad Sets: AROM;20 reps;Supine;Both Heel Slides: AAROM;20 reps;Right;Supine Straight Leg Raises: AAROM;20 reps;Right;Supine End of Session PT - End of Session Equipment Utilized During Treatment: Right knee immobilizer Activity Tolerance: Treatment limited secondary to medical complications (Comment) Patient left: in bed;with call bell in reach;with family/visitor present Nurse Communication: Mobility status for transfers;Mobility status for ambulation General Behavior During Session: [facility] for tasks performed Cognition: [facility] for tasks performed  Ricardo Perkins 10/28/2011, 3:12 PM

## 2011-10-29 DIAGNOSIS — R410 Disorientation, unspecified: Secondary | ICD-10-CM

## 2011-10-29 DIAGNOSIS — D62 Acute posthemorrhagic anemia: Secondary | ICD-10-CM

## 2011-10-29 LAB — BASIC METABOLIC PANEL
CO2: 26 mEq/L (ref 19–32)
Calcium: 8.2 mg/dL — ABNORMAL LOW (ref 8.4–10.5)
Glucose, Bld: 117 mg/dL — ABNORMAL HIGH (ref 70–99)
Sodium: 134 mEq/L — ABNORMAL LOW (ref 135–145)

## 2011-10-29 LAB — CBC
HCT: 28.4 % — ABNORMAL LOW (ref 39.0–52.0)
HCT: 28.8 % — ABNORMAL LOW (ref 39.0–52.0)
Hemoglobin: 9.3 g/dL — ABNORMAL LOW (ref 13.0–17.0)
Hemoglobin: 9.6 g/dL — ABNORMAL LOW (ref 13.0–17.0)
MCH: 30.2 pg (ref 26.0–34.0)
RBC: 3.08 MIL/uL — ABNORMAL LOW (ref 4.22–5.81)
RBC: 3.18 MIL/uL — ABNORMAL LOW (ref 4.22–5.81)
RDW: 14.9 % (ref 11.5–15.5)
WBC: 6 10*3/uL (ref 4.0–10.5)

## 2011-10-29 LAB — PROTIME-INR: INR: 1.6 — ABNORMAL HIGH (ref 0.00–1.49)

## 2011-10-29 MED ORDER — SODIUM CHLORIDE 0.9 % IV BOLUS (SEPSIS)
500.0000 mL | Freq: Once | INTRAVENOUS | Status: AC
  Administered 2011-10-29: 500 mL via INTRAVENOUS

## 2011-10-29 MED ORDER — WARFARIN SODIUM 3 MG PO TABS
3.0000 mg | ORAL_TABLET | Freq: Once | ORAL | Status: AC
  Administered 2011-10-29: 3 mg via ORAL
  Filled 2011-10-29: qty 1

## 2011-10-29 MED ORDER — SODIUM CHLORIDE 0.9 % IV BOLUS (SEPSIS)
250.0000 mL | Freq: Once | INTRAVENOUS | Status: AC
  Administered 2011-10-29: 250 mL via INTRAVENOUS

## 2011-10-29 MED ORDER — BISACODYL 10 MG RE SUPP: 10.0000 mg | Freq: Every day | RECTAL | Status: DC | PRN

## 2011-10-29 MED ORDER — SODIUM CHLORIDE 0.9 % IV SOLN
INTRAVENOUS | Status: DC
  Administered 2011-10-29: 75 mL/h via INTRAVENOUS
  Administered 2011-10-30: 08:00:00 via INTRAVENOUS

## 2011-10-29 NOTE — Progress Notes (Signed)
Physical Therapy Treatment Patient Details Name: QUAVIS KLUTZ MRN: [id number] DOB: 29-Oct-1922 Today's Date: 10/29/2011  PT Assessment/Plan  PT - Assessment/Plan Comments on Treatment Session: Pt continues orthostatic with position changes limiting OOB activity PT Plan: Discharge plan remains appropriate PT Frequency: 7X/week Recommendations for Other Services: OT consult Follow Up Recommendations: Home health PT Equipment Recommended: Rolling walker with 5" wheels;3 in 1 bedside comode PT Goals  Acute Rehab PT Goals Time For Goal Achievement: 7 days Pt will go Supine/Side to Sit: with supervision PT Goal: Supine/Side to Sit - Progress: Progressing toward goal Pt will go Sit to Supine/Side: with supervision Pt will go Sit to Stand: with supervision PT Goal: Sit to Stand - Progress: Progressing toward goal Pt will go Stand to Sit: with supervision PT Goal: Stand to Sit - Progress: Progressing toward goal Pt will Ambulate: 51 - 150 feet;with supervision;with rolling walker PT Goal: Ambulate - Progress: Not progressing Pt will Go Up / Down Stairs: 3-5 stairs;with min assist;with least restrictive assistive device PT Goal: Up/Down Stairs - Progress: Not progressing  PT Treatment Precautions/Restrictions  Precautions Precautions: Knee Required Braces or Orthoses DO NOT USE: Yes Knee Immobilizer DO NOT USE: Discontinue once straight leg raise with < 10 degree lag Restrictions Weight Bearing Restrictions: Yes RLE Weight Bearing: Weight bearing as tolerated Mobility (including Balance) Bed Mobility Supine to Sit: 4: Min assist;3: Mod assist Supine to Sit Details (indicate cue type and reason): cues for sequence and use of UEs to self assist Transfers Sit to Stand: 3: Mod assist Sit to Stand Details (indicate cue type and reason): cues for use of UEs and for LE management Stand to Sit: 3: Mod assist Stand to Sit Details: cues for use of UEs and for LE  management Ambulation/Gait Ambulation/Gait Assistance: 1: +2 Total assist Ambulation/Gait Assistance Details (indicate cue type and reason): cues for sequence and position from RW Ambulation Distance (Feet): 3 Feet Assistive device: Rolling walker Gait Pattern: Step-to pattern    Exercise  Total Joint Exercises Ankle Circles/Pumps: AROM;20 reps;Supine;Both Quad Sets: AROM;20 reps;Supine;Both Heel Slides: AAROM;20 reps;Right;Supine Straight Leg Raises: AAROM;20 reps;Right;Supine End of Session PT - End of Session Equipment Utilized During Treatment: Right knee immobilizer Activity Tolerance: Treatment limited secondary to medical complications (Comment) (Pt is orthostatic - RN present taking BPs manually) Patient left: in chair;with call bell in reach;with family/visitor present Nurse Communication: Mobility status for transfers;Mobility status for ambulation General Behavior During Session: [facility] for tasks performed Cognition: [facility] for tasks performed  Jessamyn Watterson 10/29/2011, 4:08 PM

## 2011-10-29 NOTE — Progress Notes (Signed)
Physical Therapy Treatment Patient Details Name: Ricardo Perkins MRN: [id number] DOB: [birth year] Today's Date: 10/29/2011  PT Assessment/Plan  PT - Assessment/Plan Comments on Treatment Session: Pt tolerating LTD WB 2* c/o pain - RN aware and providing MEDS PT Plan: Discharge plan remains appropriate PT Frequency: 7X/week Recommendations for Other Services: OT consult Follow Up Recommendations: Home health PT Equipment Recommended: Rolling walker with 5" wheels;3 in 1 bedside comode PT Goals  Acute Rehab PT Goals PT Goal Formulation: With patient Time For Goal Achievement: 7 days Pt will go Supine/Side to Sit: with supervision PT Goal: Supine/Side to Sit - Progress: Progressing toward goal Pt will go Sit to Supine/Side: with supervision PT Goal: Sit to Supine/Side - Progress: Progressing toward goal Pt will go Sit to Stand: with supervision PT Goal: Sit to Stand - Progress: Not progressing Pt will go Stand to Sit: with supervision PT Goal: Stand to Sit - Progress: Not progressing Pt will Ambulate: 51 - 150 feet;with supervision;with rolling walker PT Goal: Ambulate - Progress: Not progressing Pt will Go Up / Down Stairs: 3-5 stairs;with min assist;with least restrictive assistive device PT Goal: Up/Down Stairs - Progress: Not progressing  PT Treatment Precautions/Restrictions  Precautions Precautions: Knee Required Braces or Orthoses DO NOT USE: Yes Knee Immobilizer DO NOT USE: Discontinue once straight leg raise with < 10 degree lag Restrictions Weight Bearing Restrictions: Yes RLE Weight Bearing: Weight bearing as tolerated Mobility (including Balance) Bed Mobility Supine to Sit: 4: Min assist;3: Mod assist Supine to Sit Details (indicate cue type and reason): cues for sequence and use of UEs to self assist Sit to Supine: 4: Min assist;3: Mod assist Sit to Supine - Details (indicate cue type and reason): cues for sequence and use of UEs to self assist Transfers Sit to  Stand: 1: +2 Total assist (pt 60%) Sit to Stand Details (indicate cue type and reason): cues for use of UEs and for LE management Stand to Sit: 1: +2 Total assist (pt 60%) Stand to Sit Details: cues for use of UEs and for LE management Stand Pivot Transfers: 1: +2 Total assist (pt 60%) Stand Pivot Transfer Details (indicate cue type and reason): with RW and cues for posture, sequence and position from RW - pt tolerating min WB on R LE 2* c/o pain Ambulation/Gait Ambulation/Gait Assistance: 1: +2 Total assist Ambulation/Gait Assistance Details (indicate cue type and reason): cues for sequence and position from RW Ambulation Distance (Feet): 3 Feet Assistive device: Rolling walker Gait Pattern: Step-to pattern    End of Session PT - End of Session Equipment Utilized During Treatment: Right knee immobilizer Activity Tolerance: Patient limited by fatigue;Patient limited by pain (no c/o dizziness - bolus in progress) Patient left: in bed;with call bell in reach Nurse Communication: Mobility status for transfers;Mobility status for ambulation General Behavior During Session: [facility] for tasks performed Cognition: [facility] for tasks performed  Ricardo Perkins 10/29/2011, 4:19 PM

## 2011-10-29 NOTE — Progress Notes (Signed)
Subjective: 3 Days Post-Op Procedure(s) (LRB): TOTAL KNEE ARTHROPLASTY (Right) Patient reports pain as mild.   Patient has complaints of pain and confusion this morning.  His family is int he room and stated that he got confused last night.  His pressure has been soft and despite having a parameter on the BP pill, he did receive it yesterday.  His HGB is low at 9.3 and he may end up needed blood possibly.  His is fairly even on his fluid levels which may mean he needs additional fluids in the postoperative period.  He takes Norvasc for his HTN and also on atenolol due to his A.fib history.  Will continue the atenolol but keep the parameter on the Norvasc.  Will give extra fluids this morning and monitor his pressure more closely.  Will also DC all narcs and use Ultram or Tylenol for pain.  This was all discussed with the family and the patient.  Recheck labs this afternoon again to ensure no further drop in his HGB and no electrolyte changes.  Hold his PT this morning and reassess him this afternoon and work with PT is doing better.  Objective: Vital signs in last 24 hours: Temp:  [98.2 F (36.8 C)-98.3 F (36.8 C)] 98.3 F (36.8 C) (04/11 0620) Pulse Rate:  [69-71] 71  (04/11 0620) Resp:  [16] 16  (04/11 0620) BP: (99-120)/(61-88) 106/61 mmHg (04/11 1200) SpO2:  [94 %-95 %] 94 % (04/11 0620)  Intake/Output from previous day:  Intake/Output Summary (Last 24 hours) at 10/29/11 1245 Last data filed at 10/29/11 1005  Gross per 24 hour  Intake    480 ml  Output    950 ml  Net   -470 ml    Intake/Output this shift: Total I/O In: 240 [P.O.:240] Out: -   Labs:  Basename 10/29/11 0412 10/28/11 0415 10/27/11 0427  HGB 9.3* 11.0* 13.4    Basename 10/29/11 0412 10/28/11 0415  WBC 6.0 7.1  RBC 3.08* 3.76*  HCT 28.4* 34.2*  PLT 135* 144*    Basename 10/28/11 0415 10/27/11 0427  NA 137 135  K 4.2 4.4  CL 103 102  CO2 26 23  BUN 26* 22  CREATININE 1.27 1.05  GLUCOSE 129* 150*    CALCIUM 8.5 8.6    Basename 10/29/11 0412 10/28/11 0415  LABPT -- --  INR 1.60* 1.49    Exam - Neurovascular intact Sensation intact distally Dressing/Incision - clean, dry, no drainage Motor function intact - moving foot and toes well on exam.   Past Medical History  Diagnosis Date  . INSOMNIA, PERSISTENT 08/22/2008  . DECREASED HEARING 10/26/2007  . HYPERTENSION 07/23/2008  . BRADYCARDIA 04/01/2009  . C O P D 10/26/2007  . RENAL INSUFFICIENCY 04/01/2009  . BENIGN PROSTATIC HYPERTROPHY, WITH OBSTRUCTION 11/21/2007  . ASTEATOTIC ECZEMA 03/29/2008  . INSOMNIA UNSPECIFIED 11/21/2007  . LEG EDEMA, BILATERAL 01/07/2010  . PAROXYSMAL ATRIAL FIBRILLATION 11/21/2007  . Arthritis     knees, shoulders     Assessment/Plan: 3 Days Post-Op Procedure(s) (LRB): TOTAL KNEE ARTHROPLASTY (Right) Principal Problem:  *OA (osteoarthritis) of knee Active Problems:  Postop Confusion  Postop Acute blood loss anemia   DVT Prophylaxis - Lovenox and Coumadin Weight-Bearing as tolerated to right leg Hold PT this AM, recheck this PM Recheck labs DC Narcs Monitor pressure this AM Fluid bolus this AM.  Tarisa Paola 10/29/2011, 12:45 PM

## 2011-10-30 ENCOUNTER — Telehealth: Payer: Self-pay

## 2011-10-30 DIAGNOSIS — I959 Hypotension, unspecified: Secondary | ICD-10-CM

## 2011-10-30 LAB — CBC
HCT: 26.8 % — ABNORMAL LOW (ref 39.0–52.0)
Hemoglobin: 8.8 g/dL — ABNORMAL LOW (ref 13.0–17.0)
RBC: 2.95 MIL/uL — ABNORMAL LOW (ref 4.22–5.81)
RDW: 14.5 % (ref 11.5–15.5)
WBC: 4.7 10*3/uL (ref 4.0–10.5)

## 2011-10-30 LAB — BASIC METABOLIC PANEL
CO2: 25 mEq/L (ref 19–32)
Chloride: 105 mEq/L (ref 96–112)
Sodium: 137 mEq/L (ref 135–145)

## 2011-10-30 LAB — PROTIME-INR: INR: 1.54 — ABNORMAL HIGH (ref 0.00–1.49)

## 2011-10-30 MED ORDER — ENOXAPARIN SODIUM 30 MG/0.3ML ~~LOC~~ SOLN: 30.0000 mg | Freq: Two times a day (BID) | SUBCUTANEOUS | Status: DC

## 2011-10-30 MED ORDER — TRAMADOL HCL 50 MG PO TABS: 50.0000 mg | ORAL_TABLET | Freq: Four times a day (QID) | ORAL | Status: AC | PRN

## 2011-10-30 MED ORDER — WARFARIN SODIUM 5 MG PO TABS
5.0000 mg | ORAL_TABLET | Freq: Once | ORAL | Status: AC
  Administered 2011-10-30: 5 mg via ORAL
  Filled 2011-10-30: qty 1

## 2011-10-30 MED ORDER — WARFARIN SODIUM 3 MG PO TABS: 1.5000 mg | ORAL_TABLET | Freq: Every day | ORAL | Status: DC

## 2011-10-30 MED ORDER — TEMAZEPAM 15 MG PO CAPS: 30.0000 mg | ORAL_CAPSULE | Freq: Every evening | ORAL | Status: DC | PRN

## 2011-10-30 NOTE — Progress Notes (Signed)
Physical Therapy Treatment Patient Details Name: Ricardo Perkins MRN: [id number] DOB: 12-23-1922 Today's Date: 10/30/2011  PT Assessment/Plan  PT - Assessment/Plan Comments on Treatment Session: Improvement in activity tolerance but pt continues limited by fatigue and mildly unstable with all tasks.  Increased time required for all tasks.  Discussed with pt, dtr, RN and OT and feel pt would greatly benefit from one additional day acute rehab.   PT Plan: Discharge plan remains appropriate PT Frequency: 7X/week Recommendations for Other Services: OT consult Follow Up Recommendations: Home health PT Equipment Recommended: Rolling walker with 5" wheels;3 in 1 bedside comode PT Goals  Acute Rehab PT Goals PT Goal Formulation: With patient Time For Goal Achievement: 7 days Pt will go Supine/Side to Sit: with supervision PT Goal: Supine/Side to Sit - Progress: Progressing toward goal Pt will go Sit to Supine/Side: with supervision PT Goal: Sit to Supine/Side - Progress: Progressing toward goal Pt will go Sit to Stand: with supervision PT Goal: Sit to Stand - Progress: Progressing toward goal Pt will go Stand to Sit: with supervision PT Goal: Stand to Sit - Progress: Progressing toward goal Pt will Ambulate: 51 - 150 feet;with supervision;with rolling walker PT Goal: Ambulate - Progress: Progressing toward goal Pt will Go Up / Down Stairs: 3-5 stairs;with min assist;with least restrictive assistive device PT Goal: Up/Down Stairs - Progress: Progressing toward goal  PT Treatment Precautions/Restrictions  Precautions Precautions: Knee Required Braces or Orthoses DO NOT USE: Yes Required Braces or Orthoses: Knee Immobilizer - Right Knee Immobilizer - Right: Discontinue once straight leg raise with < 10 degree lag Knee Immobilizer DO NOT USE: Discontinue once straight leg raise with < 10 degree lag Restrictions Weight Bearing Restrictions: No RLE Weight Bearing: Weight bearing as  tolerated Mobility (including Balance) Bed Mobility Sit to Supine: 4: Min assist Sit to Supine - Details (indicate cue type and reason): cues for sequence and min assist with R LE Transfers Sit to Stand: 4: Min assist;With upper extremity assist;With armrests;From chair/3-in-1 Sit to Stand Details (indicate cue type and reason): cues for hand placement and LE position. Stand to Sit: 4: Min assist;With armrests;With upper extremity assist;To chair/3-in-1 Stand to Sit Details: cues for hand placement and LE position. Ambulation/Gait Ambulation/Gait Assistance: 4: Min assist Ambulation/Gait Assistance Details (indicate cue type and reason): cues for posture, sequence and position from RW Ambulation Distance (Feet):  (3x25', 34') Assistive device: Rolling walker Gait Pattern: Step-to pattern Stairs: Yes Stairs Assistance: 4: Min assist;3: Mod assist Stairs Assistance Details (indicate cue type and reason): cues for sequence, crutch/foot placement Stair Management Technique: One rail Right;Step to pattern;With crutches Number of Stairs: 3  Height of Stairs: 4     Exercise    End of Session PT - End of Session Equipment Utilized During Treatment: Right knee immobilizer;Gait belt Activity Tolerance: Patient tolerated treatment well Patient left: in bed;with call bell in reach;with family/visitor present Nurse Communication: Mobility status for transfers;Mobility status for ambulation General Behavior During Session: [facility] for tasks performed Cognition: [facility] for tasks performed  Ricardo Perkins 10/30/2011, 4:02 PM

## 2011-10-30 NOTE — Telephone Encounter (Signed)
Pt daughter called requesting refill of Temazepam for pt. Per medication list, medication has been discontinued by hospital MD. [name] says this is a mistake and she is going to discuss with hospital MD. Please advise on refill, thanks!

## 2011-10-30 NOTE — Discharge Summary (Signed)
Physician Discharge Summary   Patient ID: Ricardo Perkins MRN: [id number] DOB/AGE: [birth year] 78 y.o.  Admit date: 10/26/2011 Discharge date: 10/31/2011  Primary Diagnosis:Osteoarthritis Right  Knee  Admission Diagnoses: Past Medical History  Diagnosis Date  . INSOMNIA, PERSISTENT 08/22/2008  . DECREASED HEARING 10/26/2007  . HYPERTENSION 07/23/2008  . BRADYCARDIA 04/01/2009  . C O P D 10/26/2007  . RENAL INSUFFICIENCY 04/01/2009  . BENIGN PROSTATIC HYPERTROPHY, WITH OBSTRUCTION 11/21/2007  . ASTEATOTIC ECZEMA 03/29/2008  . INSOMNIA UNSPECIFIED 11/21/2007  . LEG EDEMA, BILATERAL 01/07/2010  . PAROXYSMAL ATRIAL FIBRILLATION 11/21/2007  . Arthritis     knees, shoulders     Discharge Diagnoses:  Principal Problem:  *OA (osteoarthritis) of knee Active Problems:  Postop Confusion  Postop Acute blood loss anemia  Postop Hypotension   Procedure: Procedure(s) (LRB): TOTAL KNEE ARTHROPLASTY (Right)   Consults: None  HPI: Ricardo Perkins is a 78 y.o. year old male with end stage OA of his right knee with progressively worsening pain and dysfunction. He has constant pain, with activity and at rest and significant functional deficits with difficulties even with ADLs. He has had extensive non-op management including analgesics, injections of cortisone and viscosupplements, and home exercise program, but remains in significant pain with significant dysfunction. Radiographs show bone on bone arthritis medial and patellofemoral compartments with osteophyte formation. He presents now for right Total Knee Arthroplasty.   Laboratory Data: Hospital Outpatient Visit on 10/22/2011  Component Date Value Range Status  . aPTT (seconds) 10/22/2011 41* 24-37 Final   Comment:                                 IF BASELINE aPTT IS ELEVATED,                          SUGGEST PATIENT RISK ASSESSMENT                          BE USED TO DETERMINE APPROPRIATE                          ANTICOAGULANT THERAPY.  . WBC (K/uL)  10/22/2011 5.4  4.0-10.5 Final  . RBC (MIL/uL) 10/22/2011 5.54  4.22-5.81 Final  . Hemoglobin (g/dL) 19/14/7829 56.2  13.0-86.5 Final  . HCT (%) 10/22/2011 50.3  39.0-52.0 Final  . MCV (fL) 10/22/2011 90.8  78.0-100.0 Final  . MCH (pg) 10/22/2011 30.0  26.0-34.0 Final  . MCHC (g/dL) 78/46/9629 52.8  41.3-24.4 Final  . RDW (%) 10/22/2011 14.7  11.5-15.5 Final  . Platelets (K/uL) 10/22/2011 172  150-400 Final  . Sodium (mEq/L) 10/22/2011 138  135-145 Final  . Potassium (mEq/L) 10/22/2011 4.3  3.5-5.1 Final  . Chloride (mEq/L) 10/22/2011 103  96-112 Final  . CO2 (mEq/L) 10/22/2011 27  19-32 Final  . Glucose, Bld (mg/dL) 07/22/7251 664* 40-34 Final  . BUN (mg/dL) 74/25/9563 20  8-75 Final  . Creatinine, Ser (mg/dL) 64/33/2951 8.84  1.66-0.63 Final  . Calcium (mg/dL) 01/60/1093 9.4  2.3-55.7 Final  . Total Protein (g/dL) 32/20/2542 7.0  7.0-6.2 Final  . Albumin (g/dL) 37/62/8315 4.1  1.7-6.1 Final  . AST (U/L) 10/22/2011 16  0-37 Final  . ALT (U/L) 10/22/2011 20  0-53 Final  . Alkaline Phosphatase (U/L) 10/22/2011 71  39-117 Final  . Total Bilirubin (mg/dL) 60/73/7106 1.0  2.6-9.4 Final  .  GFR calc non Af Amer (mL/min) 10/22/2011 48* >90 Final  . GFR calc Af Amer (mL/min) 10/22/2011 55* >90 Final   Comment:                                 The eGFR has been calculated                          using the CKD EPI equation.                          This calculation has not been                          validated in all clinical                          situations.                          eGFR's persistently                          <90 mL/min signify                          possible Chronic Kidney Disease.  Marland Kitchen Prothrombin Time (seconds) 10/22/2011 17.9* 11.6-15.2 Final  . INR  10/22/2011 1.45  0.00-1.49 Final  . Color, Urine  10/22/2011 YELLOW  YELLOW Final  . APPearance  10/22/2011 CLEAR  CLEAR Final  . Specific Gravity, Urine  10/22/2011 1.008  1.005-1.030 Final  . pH  10/22/2011 6.5   5.0-8.0 Final  . Glucose, UA (mg/dL) 46/96/2952 NEGATIVE  NEGATIVE Final  . Hgb urine dipstick  10/22/2011 NEGATIVE  NEGATIVE Final  . Bilirubin Urine  10/22/2011 NEGATIVE  NEGATIVE Final  . Ketones, ur (mg/dL) 84/13/2440 NEGATIVE  NEGATIVE Final  . Protein, ur (mg/dL) 05/16/2535 NEGATIVE  NEGATIVE Final  . Urobilinogen, UA (mg/dL) 64/40/3474 0.2  2.5-9.5 Final  . Nitrite  10/22/2011 NEGATIVE  NEGATIVE Final  . Leukocytes, UA  10/22/2011 NEGATIVE  NEGATIVE Final   MICROSCOPIC NOT DONE ON URINES WITH NEGATIVE PROTEIN, BLOOD, LEUKOCYTES, NITRITE, OR GLUCOSE <1000 mg/dL.  Marland Kitchen MRSA, PCR  10/22/2011 NEGATIVE  NEGATIVE Final  . Staphylococcus aureus  10/22/2011 NEGATIVE  NEGATIVE Final   Comment:                                 The Xpert SA Assay (FDA                          approved for NASAL specimens                          only), is one component of                          a comprehensive surveillance                          program.  It is not intended  to diagnose infection nor to                          guide or monitor treatment.  . ABO/RH(D)  10/22/2011 O POS   Final  . Antibody Screen  10/22/2011 NEG   Final  . Sample Expiration  10/22/2011 10/29/2011   Final  . ABO/RH(D)  10/22/2011 O POS   Final    Basename 10/30/11 0402 10/29/11 1427 10/29/11 0412 10/28/11 0415  HGB 8.8* 9.6* 9.3* 11.0*    Basename 10/30/11 0402 10/29/11 1427  WBC 4.7 6.4  RBC 2.95* 3.18*  HCT 26.8* 28.8*  PLT 138* 147*    Basename 10/30/11 0402 10/29/11 1427  NA 137 134*  K 3.9 4.1  CL 105 103  CO2 25 26  BUN 23 24*  CREATININE 1.17 1.23  GLUCOSE 109* 117*  CALCIUM 8.2* 8.2*    Basename 10/30/11 0402 10/29/11 0412  LABPT -- --  INR 1.54* 1.60*    X-Rays:No results found.  EKG: Orders placed in visit on 09/11/11  . EKG 12-LEAD     Hospital Course: Patient was admitted to [facility] and taken to the OR and underwent the above state procedure  without complications.  Patient tolerated the procedure well and was later transferred to the recovery room and then to the orthopaedic floor for postoperative care.  They were given PO and IV analgesics for pain control following their surgery.  They were given 24 hours of postoperative antibiotics and started on DVT prophylaxis in the form of Lovenox and Coumadin.   PT and OT were ordered for total hip protocol.  The patient was allowed to be PWB with therapy. Discharge planning was consulted to help with postop disposition and equipment needs.  Patient had a tough night on the evening of surgery with pain and not much sleep but started to get up OOB with therapy on day one.  Hemovac drain was pulled without difficulty.   Continued to work with therapy into day two.  Dressing was changed on day two and the incision was healing well.  By day three, patient had complaints of pain and confusion this morning. His family was in the room and stated that he got confused last night. His pressure has been soft and despite having a parameter on the BP pill, he did receive it yesterday. His HGB is low at 9.3 and he may end up needed blood possibly. His was fairly even on his fluid levels which may mean he needs additional fluids in the postoperative period. He takes Norvasc for his HTN and also on atenolol due to his A.fib history. Will continue the atenolol but keep the parameter on the Norvasc. Gave extra fluids this morning and monitor his pressure more closely. Will also DC all narcs and use Ultram or Tylenol for pain. This was all discussed with the family and the patient. Recheck labs that afternoon again to ensure no further drop in his HGB and no electrolyte changes. Held his PT that morning and reassessed him that afternoon and work with PT if doing better.  On day four, patient doing much better that morning. Given extra fluids on day 3 and his pressure was doing better. His confusion was transient and had resolved  by day four. Family in room. As long as he does well with two sessions of therapy today, will allow him to go home later that afternoon. It would depend upon his  response and progress today.  Unfortunately, he did mot make his goals.  By day five, the patient had progressed with therapy and meeting their goals.  Incision was healing well.  Patient was seen in rounds byt he weekend coverage staff and was ready to go home.  Discharge Medications: Prior to Admission medications   Medication Sig Start Date End Date Taking? Authorizing Provider  amLODipine (NORVASC) 5 MG tablet Take 5 mg by mouth daily after breakfast.   Yes Historical Provider, MD  atenolol (TENORMIN) 50 MG tablet Take 25 mg by mouth 2 (two) times daily.   Yes Historical Provider, MD  finasteride (PROSCAR) 5 MG tablet Take 5 mg by mouth daily.    Yes Historical Provider, MD  omeprazole (PRILOSEC) 20 MG capsule Take 1 capsule (20 mg total) by mouth daily. 07/24/11  Yes Jacques Navy, MD  Tamsulosin HCl (FLOMAX) 0.4 MG CAPS Take 0.4 mg by mouth 2 (two) times daily.    Yes Historical Provider, MD  temazepam (RESTORIL) 30 MG capsule Take 1 capsule (30 mg total) by mouth at bedtime as needed for sleep. 04/30/11  Yes Jacques Navy, MD  enoxaparin (LOVENOX) 30 MG/0.3ML injection Inject 0.3 mLs (30 mg total) into the skin every 12 (twelve) hours. 10/30/11   Alphonsa Brickle Julien Girt, PA  traMADol (ULTRAM) 50 MG tablet Take 1-2 tablets (50-100 mg total) by mouth every 6 (six) hours as needed. 10/30/11 11/09/11  Theoplis Garciagarcia, PA  warfarin (COUMADIN) 3 MG tablet Take 0.5-1 tablets (1.5-3 mg total) by mouth daily. 1 tablet every day but on Thursday 0.5 tablet 10/30/11   An Schnabel Julien Girt, PA    Diet: heart healthy Activity:WBAT Follow-up:in 2 weeks Disposition: Home with family Discharged Condition: fair   Discharge Orders    Future Appointments: Provider: Department: Dept Phone: Center:   11/18/2011 2:00 PM [facility]  [facility] [phone] None     Future Orders Please Complete By Expires   Diet - low sodium heart healthy      Call MD / Call 911      Comments:   If you experience chest pain or shortness of breath, CALL 911 and be transported to the hospital emergency room.  If you develope a fever above 101 F, pus (white drainage) or increased drainage or redness at the wound, or calf pain, call your surgeon's office.   Constipation Prevention      Comments:   Drink plenty of fluids.  Prune juice may be helpful.  You may use a stool softener, such as Colace (over the counter) 100 mg twice a day.  Use MiraLax (over the counter) for constipation as needed.   Increase activity slowly as tolerated      Weight Bearing as taught in Physical Therapy      Comments:   Use a walker or crutches as instructed.   Discharge instructions      Comments:   Pick up stool softner and laxative for home. Do not submerge incision under water. May shower. Continue to use ice for pain and swelling from surgery.     Driving restrictions      Comments:   No driving   Lifting restrictions      Comments:   No lifting   Ricardo hose      Comments:   Use stockings (Ricardo hose) for 3 weeks on both leg(s).  You may remove them at night for sleeping.   Change dressing      Comments:  Change dressing daily with sterile 4 x 4 inch gauze dressing and apply Ricardo hose.   Do not put a pillow under the knee. Place it under the heel.        Medication List  As of 10/30/2011  9:03 AM   STOP taking these medications         KRILL OIL OMEGA-3 PO      meclizine 12.5 MG tablet      Vitamin D3 1000 UNITS Caps         TAKE these medications         amLODipine 5 MG tablet   Commonly known as: NORVASC   Take 5 mg by mouth daily after breakfast.      atenolol 50 MG tablet   Commonly known as: TENORMIN   Take 25 mg by mouth 2 (two) times daily.      enoxaparin 30 MG/0.3ML injection   Commonly known as: LOVENOX   Inject  0.3 mLs (30 mg total) into the skin every 12 (twelve) hours.      finasteride 5 MG tablet   Commonly known as: PROSCAR   Take 5 mg by mouth daily.      omeprazole 20 MG capsule   Commonly known as: PRILOSEC   Take 1 capsule (20 mg total) by mouth daily.      Tamsulosin HCl 0.4 MG Caps   Commonly known as: FLOMAX   Take 0.4 mg by mouth 2 (two) times daily.      temazepam 30 MG capsule   Commonly known as: RESTORIL   Take 1 capsule (30 mg total) by mouth at bedtime as needed for sleep.      traMADol 50 MG tablet   Commonly known as: ULTRAM   Take 1-2 tablets (50-100 mg total) by mouth every 6 (six) hours as needed.      warfarin 3 MG tablet   Commonly known as: COUMADIN   Take 0.5-1 tablets (1.5-3 mg total) by mouth daily. 1 tablet every day but on Thursday 0.5 tablet           Follow-up Information    Follow up with Loanne Drilling, MD. Schedule an appointment as soon as possible for a visit in 2 weeks.   Contact information:   [facility] [address], [address] [place] [address] [phone]          Signed: Patrica Duel 10/30/2011, 9:03 AM

## 2011-10-30 NOTE — Progress Notes (Signed)
Physical Therapy Treatment Patient Details Name: Ricardo Perkins MRN: [id number] DOB: 1922-11-08 Today's Date: 10/30/2011  PT Assessment/Plan  PT - Assessment/Plan Comments on Treatment Session: Pt with one episode dizziness with initial stand - RN taking manual BP PT Plan: Discharge plan remains appropriate PT Frequency: 7X/week Recommendations for Other Services: OT consult Follow Up Recommendations: Home health PT Equipment Recommended: Rolling walker with 5" wheels;3 in 1 bedside comode PT Goals  Acute Rehab PT Goals PT Goal Formulation: With patient Time For Goal Achievement: 7 days Pt will go Supine/Side to Sit: with supervision PT Goal: Supine/Side to Sit - Progress: Progressing toward goal Pt will go Sit to Supine/Side: with supervision Pt will go Sit to Stand: with supervision PT Goal: Sit to Stand - Progress: Progressing toward goal Pt will go Stand to Sit: with supervision PT Goal: Stand to Sit - Progress: Progressing toward goal Pt will Ambulate: 51 - 150 feet;with supervision;with rolling walker PT Goal: Ambulate - Progress: Progressing toward goal  PT Treatment Precautions/Restrictions  Precautions Precautions: Knee Required Braces or Orthoses DO NOT USE: Yes Required Braces or Orthoses: Knee Immobilizer - Right Knee Immobilizer - Right: Discontinue once straight leg raise with < 10 degree lag Knee Immobilizer DO NOT USE: Discontinue once straight leg raise with < 10 degree lag Restrictions Weight Bearing Restrictions: No RLE Weight Bearing: Weight bearing as tolerated Mobility (including Balance) Bed Mobility Supine to Sit: 4: Min assist Supine to Sit Details (indicate cue type and reason): min cues for sequence and min assist for R LE Transfers Sit to Stand: 4: Min assist Sit to Stand Details (indicate cue type and reason): cues for LE position and use of UEs Stand to Sit: 4: Min assist Stand to Sit Details: cues for LE position and use of  UEs Ambulation/Gait Ambulation/Gait Assistance: 4: Min assist;3: Mod assist Ambulation/Gait Assistance Details (indicate cue type and reason): cues for postrue, sequence, and position from [employer] Ambulation Distance (Feet): 42 Feet Assistive device: Rolling walker Gait Pattern: Step-to pattern Stairs: No    Exercise  Total Joint Exercises Ankle Circles/Pumps: AROM;20 reps;Supine;Both Quad Sets: AROM;20 reps;Supine;Both Heel Slides: AAROM;20 reps;Right;Supine Straight Leg Raises: AAROM;20 reps;Right;Supine End of Session PT - End of Session Equipment Utilized During Treatment: Right knee immobilizer (dtr applied KI with VC) Activity Tolerance: Patient tolerated treatment well Patient left: in chair;with call bell in reach;with family/visitor present Nurse Communication: Mobility status for transfers;Mobility status for ambulation General Behavior During Session: [facility] for tasks performed Cognition: [facility] for tasks performed  Tris Howell 10/30/2011, 11:47 AM

## 2011-10-30 NOTE — Progress Notes (Signed)
ANTICOAGULATION CONSULT NOTE - Follow Up Consult  Pharmacy Consult for Coumadin Indication: atrial fibrillation and VTE prophylaxis  No Known Allergies  Patient Measurements: Height: 5\' 8"  (172.7 cm) Weight: 188 lb (85.276 kg) IBW/kg (Calculated) : 68.4    Vital Signs: Temp: 99.1 F (37.3 C) (04/12 0610) Temp src: Oral (04/12 0610) BP: 90/52 mmHg (04/12 0846) Pulse Rate: 79  (04/12 0610)  Labs:  Basename 10/30/11 0402 10/29/11 1427 10/29/11 0412 10/28/11 0415  HGB 8.8* 9.6* -- --  HCT 26.8* 28.8* 28.4* --  PLT 138* 147* 135* --  APTT -- -- -- --  LABPROT 18.8* -- 19.3* 18.3*  INR 1.54* -- 1.60* 1.49  HEPARINUNFRC -- -- -- --  CREATININE 1.17 1.23 -- 1.27  CKTOTAL -- -- -- --  CKMB -- -- -- --  TROPONINI -- -- -- --   Estimated Creatinine Clearance: 47.3 ml/min (by C-G formula based on Cr of 1.17).   Medications:  Scheduled:     . amLODipine  5 mg Oral QPC breakfast  . atenolol  25 mg Oral BID  . docusate sodium  100 mg Oral BID  . enoxaparin  30 mg Subcutaneous Q12H  . finasteride  5 mg Oral Daily  . omeprazole  20 mg Oral Q1200  . sodium chloride  500 mL Intravenous Once  . Tamsulosin HCl  0.4 mg Oral BID  . warfarin  3 mg Oral ONCE-1800  . Warfarin - Pharmacist Dosing Inpatient   Does not apply q1800  . DISCONTD: sodium chloride   Intravenous Once    Assessment:  89yom on chronic coumadin therapy for atrial fibrillation PTA.   Warfarin was held for right TKA, resumed post op for VTE prophylaxis as well as chronic afib.   Home dose: 3 mg po daily except 1.5 mg on Thursdays and is followed by [place] anticoagulation clinic.  INR remains subtherapeutic & essentially unchanged s/p 5mg , 3mg , 3mg , 3mg .  Goal of Therapy:  INR 2-3   Plan:   Increase coumadin 5mg  today  Lovenox 30mg  q12h until INR >/= 1.8 per MD  Daily PT/INR  Loralee Pacas, PharmD, BCPS Pager: [phone] 10:41 AM 10/30/2011

## 2011-10-30 NOTE — Discharge Instructions (Signed)
Knee Rehabilitation, Guidelines Following Surgery Results after knee surgery are often greatly improved when you follow the exercise, range of motion and muscle strengthening exercises prescribed by your doctor. Safety measures are also important to protect the knee from further injury. Any time any of these exercises cause you to have increased pain or swelling in your knee joint, decrease the amount until you are comfortable again and slowly increase them. If you have problems or questions, call your caregiver or physical therapist for advice. HOME CARE INSTRUCTIONS   Remove items at home which could result in a fall. This includes throw rugs or furniture in walking pathways.   Continue medications as instructed.   You may shower or take tub baths when your staples or stitches are removed or as instructed.   Walk using crutches or walker as instructed.   Put weight on your legs and walk as much as is comfortable.   You may resume a sexual relationship in one month or when given the OK by your doctor.   Return to work as instructed by your doctor.   Do not drive a car for 6 weeks or as instructed.   Wear elastic stockings until instructed not to.   Make sure you keep all of your appointments after your operation with all of your doctors and caregivers.  RANGE OF MOTION AND STRENGTHENING EXERCISES Rehabilitation of the knee is important following a knee injury or an operation. After just a few days of immobilization, the muscles of the thigh which control the knee become weakened and shrink (atrophy). Knee exercises are designed to build up the tone and strength of the thigh muscles and to improve knee motion. Often times heat used for twenty to thirty minutes before working out will loosen up your tissues and help with improving the range of motion. These exercises can be done on a training (exercise) mat, on the floor, on a table or on a bed. Use what ever works the best and is most  comfortable for you Knee exercises include:  Leg Lifts - While your knee is still immobilized in a splint or cast, you can do straight leg raises. Lift the leg to 60 degrees, hold for 3 sec, and slowly lower the leg. Repeat 10-20 times 2-3 times daily. Perform this exercise against resistance later as your knee gets better.   Quad and Hamstring Sets - Tighten up the muscle on the front of the thigh (Quad) and hold for 5-10 sec. Repeat this 10-20 times hourly. Hamstring sets are done by pushing the foot backward against an object and holding for 5-10 sec. Repeat as with quad sets.  A rehabilitation program following serious knee injuries can speed recovery and prevent re-injury in the future due to weakened muscles. Contact your doctor or a physical therapist for more information on knee rehabilitation. MAKE SURE YOU:   Understand these instructions.   Will watch your condition.   Will get help right away if you are not doing well or get worse.  Document Released: 07/06/2005 Document Revised: 06/25/2011 Document Reviewed: 12/24/2006 [employer] Patient Information 2012 [employer].  Pick up stool softner and laxative for home. Do not submerge incision under water. May shower. Continue to use ice for pain and swelling from surgery.  

## 2011-10-30 NOTE — Progress Notes (Signed)
Occupational Therapy Treatment Patient Details Name: Ricardo Perkins MRN: [id number] DOB: [birth year] Today's Date: 10/30/2011  OT Assessment/Plan OT Assessment/Plan Comments on Treatment Session: Pt is continuing to make slow progress towards goals. Don't feel pt is ready for d/c today. Pt may possibly need st snf. Unsure if daughter will be able to provide necessary level of A. OT Plan: Discharge plan needs to be updated OT Frequency: Min 2X/week Follow Up Recommendations: Home health OT with Supervision/Assistance - 24 hour vs Skilled nursing facility Equipment Recommended: Rolling walker with 5" wheels;3 in 1 bedside comode OT Goals ADL Goals ADL Goal: Toilet Transfer - Progress: Progressing toward goals ADL Goal: Toileting - Clothing Manipulation - Progress: Progressing toward goals ADL Goal: Toileting - Hygiene - Progress: Progressing toward goals ADL Goal: Tub/Shower Transfer - Progress: Progressing toward goals  OT Treatment Precautions/Restrictions  Precautions Precautions: Knee Required Braces or Orthoses: Knee Immobilizer - Right Knee Immobilizer - Right: Discontinue once straight leg raise with < 10 degree lag Restrictions Weight Bearing Restrictions: No RLE Weight Bearing: Weight bearing as tolerated   ADL ADL Toilet Transfer: Minimal assistance;Moderate assistance Toilet Transfer Details (indicate cue type and reason): cues for safe technique manipulating RW around bathroom. Toilet Transfer Method: Proofreader: Raised toilet seat with arms (or 3-in-1 over toilet) Toileting - Clothing Manipulation: Moderate assistance;Performed Toileting - Clothing Manipulation Details (indicate cue type and reason): pulling shorts up and down. Where Assessed - Toileting Clothing Manipulation: Sit to stand from 3-in-1 or toilet Toileting - Hygiene: Performed;Minimal assistance Where Assessed - Toileting Hygiene: Sit to stand from 3-in-1 or toilet Tub/Shower  Transfer: Performed;Moderate assistance Tub/Shower Transfer Details (indicate cue type and reason): Cues for safe technique. Also educated daughter. Tub/Shower Transfer Method: Science writer: Walk in shower ADL Comments: Pt fatigues very quickly. Pt needs extensive A with ADLs and occasional +2 A with mobility. Mobility  Transfers Sit to Stand: 4: Min assist;With upper extremity assist;With armrests;From chair/3-in-1 Sit to Stand Details (indicate cue type and reason): cues for hand placement and LE position. Stand to Sit: 4: Min assist;With armrests;With upper extremity assist;To chair/3-in-1 Stand to Sit Details: cues for hand placement and LE position. Exercises    End of Session OT - End of Session Equipment Utilized During Treatment: Right knee immobilizer Activity Tolerance: Patient limited by fatigue Patient left: in chair;with call bell in reach;with family/visitor present General Behavior During Session: [facility] for tasks performed Cognition: [facility] for tasks performed  Caleigh Rabelo A OTR/L [phone] 10/30/2011, 3:21 PM

## 2011-10-30 NOTE — Progress Notes (Signed)
Physical Therapy Treatment Patient Details Name: Ricardo Perkins MRN: [id number] DOB: 07/31/1922 Today's Date: 10/30/2011  PT Assessment/Plan  PT - Assessment/Plan Comments on Treatment Session: Pt with min c/o dizziness - RN present and monitoring BP PT Plan: Discharge plan remains appropriate PT Frequency: 7X/week Follow Up Recommendations: Home health PT Equipment Recommended: Rolling walker with 5" wheels;3 in 1 bedside comode PT Goals  Acute Rehab PT Goals Time For Goal Achievement: 7 days Pt will go Supine/Side to Sit: with supervision PT Goal: Supine/Side to Sit - Progress: Progressing toward goal Pt will go Sit to Stand: with supervision PT Goal: Sit to Stand - Progress: Progressing toward goal Pt will go Stand to Sit: with supervision PT Goal: Stand to Sit - Progress: Progressing toward goal Pt will Ambulate: 51 - 150 feet;with supervision;with rolling walker PT Goal: Ambulate - Progress: Progressing toward goal  PT Treatment Precautions/Restrictions  Precautions Precautions: Knee Required Braces or Orthoses DO NOT USE: Yes Required Braces or Orthoses: Knee Immobilizer - Right Knee Immobilizer - Right: Discontinue once straight leg raise with < 10 degree lag Knee Immobilizer DO NOT USE: Discontinue once straight leg raise with < 10 degree lag Restrictions Weight Bearing Restrictions: No RLE Weight Bearing: Weight bearing as tolerated Mobility (including Balance) Bed Mobility Supine to Sit: 4: Min assist Supine to Sit Details (indicate cue type and reason): min cues for sequence and min assist for R LE Transfers Sit to Stand: 3: Mod assist;4: Min assist Sit to Stand Details (indicate cue type and reason): cues for LE position and use of UEs Stand to Sit: 3: Mod assist;4: Min assist;With upper extremity assist;To chair/3-in-1 Stand to Sit Details: cues for LE position and use of UEs Ambulation/Gait Ambulation/Gait Assistance: 3: Mod assist Ambulation/Gait Assistance  Details (indicate cue type and reason): cues for posture, sequence and position from RW; Pt tolerating ltd weight on R LE but insistant on moving to chair Ambulation Distance (Feet): 5 Feet Assistive device: Rolling walker Gait Pattern: Step-to pattern Stairs: No    Exercise    End of Session PT - End of Session Equipment Utilized During Treatment: Right knee immobilizer Activity Tolerance: Patient limited by pain Patient left: in chair;with call bell in reach;with family/visitor present Nurse Communication: Mobility status for transfers;Mobility status for ambulation General Behavior During Session: [facility] for tasks performed Cognition: [facility] for tasks performed  Aradhana Gin 10/30/2011, 11:39 AM

## 2011-10-30 NOTE — Progress Notes (Signed)
Subjective: 4 Days Post-Op Procedure(s) (LRB): TOTAL KNEE ARTHROPLASTY (Right) Patient reports pain as mild.   Patient seen in rounds with Dr. Lequita Halt. Patient doing much better this morning.  Given extra fluids yesterday and his pressure is doing better.  His confusion was transient and has resolved today. Family in room.  As long as he does well with two sessions of therapy today, will allow him to go home later this afternoon.  It will depend upon his response and progress today.     Objective: Vital signs in last 24 hours: Temp:  [98.9 F (37.2 C)-99.1 F (37.3 C)] 99.1 F (37.3 C) (04/12 0610) Pulse Rate:  [64-79] 79  (04/12 0610) Resp:  [15-18] 15  (04/12 0610) BP: (76-129)/(48-68) 129/68 mmHg (04/12 0610) SpO2:  [93 %-94 %] 93 % (04/12 0610)  Intake/Output from previous day:  Intake/Output Summary (Last 24 hours) at 10/30/11 0846 Last data filed at 10/30/11 0610  Gross per 24 hour  Intake    730 ml  Output   1955 ml  Net  -1225 ml    Intake/Output this shift:    Labs:  Basename 10/30/11 0402 10/29/11 1427 10/29/11 0412 10/28/11 0415  HGB 8.8* 9.6* 9.3* 11.0*    Basename 10/30/11 0402 10/29/11 1427  WBC 4.7 6.4  RBC 2.95* 3.18*  HCT 26.8* 28.8*  PLT 138* 147*    Basename 10/30/11 0402 10/29/11 1427  NA 137 134*  K 3.9 4.1  CL 105 103  CO2 25 26  BUN 23 24*  CREATININE 1.17 1.23  GLUCOSE 109* 117*  CALCIUM 8.2* 8.2*    Basename 10/30/11 0402 10/29/11 0412  LABPT -- --  INR 1.54* 1.60*    Exam: Neurovascular intact Sensation intact distally Incision - clean, dry, healed, healing Motor function intact - moving foot and toes well on exam.   Assessment/Plan: 4 Days Post-Op Procedure(s) (LRB): TOTAL KNEE ARTHROPLASTY (Right) Procedure(s) (LRB): TOTAL KNEE ARTHROPLASTY (Right) Past Medical History  Diagnosis Date  . INSOMNIA, PERSISTENT 08/22/2008  . DECREASED HEARING 10/26/2007  . HYPERTENSION 07/23/2008  . BRADYCARDIA 04/01/2009  . C O P D  10/26/2007  . RENAL INSUFFICIENCY 04/01/2009  . BENIGN PROSTATIC HYPERTROPHY, WITH OBSTRUCTION 11/21/2007  . ASTEATOTIC ECZEMA 03/29/2008  . INSOMNIA UNSPECIFIED 11/21/2007  . LEG EDEMA, BILATERAL 01/07/2010  . PAROXYSMAL ATRIAL FIBRILLATION 11/21/2007  . Arthritis     knees, shoulders    Principal Problem:  *OA (osteoarthritis) of knee Active Problems:  Postop Confusion  Postop Acute blood loss anemia  Postop Hypotension   Up with therapy D/C IV fluids Discharge home with home health Diet - heart healthy Follow up - in 2 weeks Activity - WBAT Condition Upon Discharge - Stable at time of note, home only if improved D/C Meds - See DC Summary DVT Prophylaxis - Lovenox and Coumadin - INR is 1.54 this morning.  Patrica Duel 10/30/2011, 8:46 AM

## 2011-10-31 LAB — BASIC METABOLIC PANEL
CO2: 24 mEq/L (ref 19–32)
Calcium: 8.2 mg/dL — ABNORMAL LOW (ref 8.4–10.5)
Creatinine, Ser: 1.06 mg/dL (ref 0.50–1.35)
GFR calc non Af Amer: 61 mL/min — ABNORMAL LOW (ref >90)
Glucose, Bld: 118 mg/dL — ABNORMAL HIGH (ref 70–99)

## 2011-10-31 LAB — CBC
MCH: 30.3 pg (ref 26.0–34.0)
MCHC: 33.2 g/dL (ref 30.0–36.0)
MCV: 91.4 fL (ref 78.0–100.0)
Platelets: 175 10*3/uL (ref 150–400)
RBC: 2.67 MIL/uL — ABNORMAL LOW (ref 4.22–5.81)

## 2011-10-31 LAB — PROTIME-INR: Prothrombin Time: 19.8 seconds — ABNORMAL HIGH (ref 11.6–15.2)

## 2011-10-31 MED ORDER — WARFARIN SODIUM 3 MG PO TABS
3.0000 mg | ORAL_TABLET | Freq: Once | ORAL | Status: DC
  Filled 2011-10-31: qty 1

## 2011-10-31 NOTE — Progress Notes (Signed)
ANTICOAGULATION CONSULT NOTE - Follow Up Consult  Pharmacy Consult for Coumadin Indication: atrial fibrillation and VTE prophylaxis  No Known Allergies  Patient Measurements: Height: 5\' 8"  (172.7 cm) Weight: 188 lb (85.276 kg) IBW/kg (Calculated) : 68.4    Vital Signs: Temp: 99 F (37.2 C) (04/13 0419) Temp src: Oral (04/13 0419) BP: 125/67 mmHg (04/13 0419) Pulse Rate: 78  (04/13 0419)  Labs:  Basename 10/31/11 0520 10/30/11 0402 10/29/11 1427 10/29/11 0412  HGB 8.1* 8.8* -- --  HCT 24.4* 26.8* 28.8* --  PLT 175 138* 147* --  APTT -- -- -- --  LABPROT 19.8* 18.8* -- 19.3*  INR 1.65* 1.54* -- 1.60*  HEPARINUNFRC -- -- -- --  CREATININE 1.06 1.17 1.23 --  CKTOTAL -- -- -- --  CKMB -- -- -- --  TROPONINI -- -- -- --   Estimated Creatinine Clearance: 52.2 ml/min (by C-G formula based on Cr of 1.06).   Medications:  Scheduled:     . amLODipine  5 mg Oral QPC breakfast  . atenolol  25 mg Oral BID  . docusate sodium  100 mg Oral BID  . enoxaparin  30 mg Subcutaneous Q12H  . finasteride  5 mg Oral Daily  . omeprazole  20 mg Oral Q1200  . Tamsulosin HCl  0.4 mg Oral BID  . warfarin  5 mg Oral Once  . Warfarin - Pharmacist Dosing Inpatient   Does not apply q1800    Assessment:  89yom on chronic coumadin therapy for atrial fibrillation PTA.   Warfarin was held for right TKA, resumed post op for VTE prophylaxis as well as chronic afib.   Home dose: 3 mg po daily except 1.5 mg on Thursdays and is followed by [facility] anticoagulation clinic.  INR remains subtherapeutic & increased some today s/p 5mg , 3mg , 3mg , 3mg , and 5mg   Goal of Therapy:  INR 2-3   Plan:   Anticipate INR to continue to rise - 3mg  today  Lovenox 30mg  q12h until INR >/= 1.8 per MD  Daily PT/INR  Hessie Knows, PharmD, BCPS Pager [phone] 10/31/2011 9:00 AM

## 2011-10-31 NOTE — Telephone Encounter (Signed)
Ok #30 no ref Thx

## 2011-10-31 NOTE — Progress Notes (Signed)
Subjective: Procedure(s) (LRB): TOTAL KNEE ARTHROPLASTY (Right) 5 Days Post-Op  Patient reports pain as moderate.  Reports unchanged leg pain Positive void Positive bowel movement Positive flatus Negative chest pain or shortness of breath  Objective: Vital signs in last 24 hours: Temp:  [97.7 F (36.5 C)-99 F (37.2 C)] 99 F (37.2 C) (04/13 0419) Pulse Rate:  [65-78] 78  (04/13 0419) Resp:  [16-18] 16  (04/13 0419) BP: (115-130)/(58-76) 125/67 mmHg (04/13 0419) SpO2:  [96 %-99 %] 99 % (04/13 0419)  Intake/Output from previous day: 04/12 0701 - 04/13 0700 In: 720 [P.O.:720] Out: 300 [Urine:300]   Basename 10/31/11 0520 10/30/11 0402  WBC 4.7 4.7  RBC 2.67* 2.95*  HCT 24.4* 26.8*  PLT 175 138*    Basename 10/31/11 0520 10/30/11 0402  NA 140 137  K 3.7 3.9  CL 108 105  CO2 24 25  BUN 28* 23  CREATININE 1.06 1.17  GLUCOSE 118* 109*  CALCIUM 8.2* 8.2*    Basename 10/31/11 0520 10/30/11 0402  LABPT -- --  INR 1.65* 1.54*    ABD soft Sensation intact distally Intact pulses distally Dorsiflexion/Plantar flexion intact Incision: dressing C/D/I Compartment soft  Assessment/Plan: Patient stable  Continue mobilization with physical therapy Continue care  Discharge home with home health Okay to go home if he clears PT later today Dr. Shon Baton spoke to and evaluated the patient today and agrees with the plan.   Gwinda Maine 10/31/2011, 9:26 AM

## 2011-10-31 NOTE — Progress Notes (Signed)
Physical Therapy Treatment Patient Details Name: Ricardo Perkins MRN: [id number] DOB: 03/31/1923 Today's Date: 10/31/2011  PT Assessment/Plan  PT - Assessment/Plan Comments on Treatment Session: pt manual Bp in standing 106/58; pt asymptomatic, no dizziness etc.  Pt and dtr feel comfortable with D/c today PT Plan: Discharge plan remains appropriate Follow Up Recommendations: Home health PT PT Goals  Acute Rehab PT Goals Pt will go Supine/Side to Sit: with supervision PT Goal: Supine/Side to Sit - Progress: Progressing toward goal Pt will go Sit to Stand: with supervision PT Goal: Sit to Stand - Progress: Met Pt will go Stand to Sit: with supervision PT Goal: Stand to Sit - Progress: Met Pt will Ambulate: 51 - 150 feet;with supervision;with rolling walker PT Goal: Ambulate - Progress: Met Pt will Go Up / Down Stairs: 3-5 stairs;with min assist;with least restrictive assistive device PT Goal: Up/Down Stairs - Progress: Met  PT Treatment Precautions/Restrictions  Precautions Precautions: Knee Required Braces or Orthoses DO NOT USE: Yes Required Braces or Orthoses: Knee Immobilizer - Right Knee Immobilizer - Right: Discontinue once straight leg raise with < 10 degree lag Knee Immobilizer DO NOT USE: Discontinue once straight leg raise with < 10 degree lag Restrictions Weight Bearing Restrictions: No RLE Weight Bearing: Weight bearing as tolerated Mobility (including Balance) Bed Mobility Supine to Sit: 4: Min assist;5: Supervision Supine to Sit Details (indicate cue type and reason): cues for sequence and use of UEs to self assist Transfers Sit to Stand: 5: Supervision;From bed;With upper extremity assist;From chair/3-in-1 Sit to Stand Details (indicate cue type and reason): increased tiem Stand to Sit: 5: Supervision;With armrests;With upper extremity assist;To chair/3-in-1 Stand to Sit Details: suggestive, subtle cues for hand placement Ambulation/Gait Ambulation/Gait  Assistance: 5: Supervision Ambulation/Gait Assistance Details (indicate cue type and reason): cues for posture Ambulation Distance (Feet): 120 Feet Assistive device: Rolling walker Gait Pattern: Step-to pattern Stairs Assistance: 4: Min assist Stairs Assistance Details (indicate cue type and reason): cues for sequence, crutch/foot placement Stair Management Technique: One rail Right;Step to pattern;With crutches Number of Stairs: 4     Exercise  Total Joint Exercises Ankle Circles/Pumps: AROM;20 reps;Supine;Both Quad Sets: AROM;Right;10 reps Short Arc QuadBarbaraann Perkins;Right;10 reps Heel Slides: AROM;AAROM;10 reps;Right Straight Leg Raises: AAROM;Right;10 reps End of Session PT - End of Session Equipment Utilized During Treatment: Right knee immobilizer;Gait belt Activity Tolerance: Patient tolerated treatment well Patient left: in chair;with call bell in reach;with family/visitor present Nurse Communication: Mobility status for transfers;Mobility status for ambulation General Behavior During Session: [facility] for tasks performed Cognition: [facility] for tasks performed  [facility] 10/31/2011, 12:01 PM

## 2011-11-01 DIAGNOSIS — M1710 Unilateral primary osteoarthritis, unspecified knee: Secondary | ICD-10-CM | POA: Diagnosis not present

## 2011-11-01 DIAGNOSIS — J438 Other emphysema: Secondary | ICD-10-CM | POA: Diagnosis not present

## 2011-11-01 DIAGNOSIS — Z96659 Presence of unspecified artificial knee joint: Secondary | ICD-10-CM | POA: Diagnosis not present

## 2011-11-01 DIAGNOSIS — J449 Chronic obstructive pulmonary disease, unspecified: Secondary | ICD-10-CM | POA: Diagnosis not present

## 2011-11-01 DIAGNOSIS — Z471 Aftercare following joint replacement surgery: Secondary | ICD-10-CM | POA: Diagnosis not present

## 2011-11-01 DIAGNOSIS — I4891 Unspecified atrial fibrillation: Secondary | ICD-10-CM | POA: Diagnosis not present

## 2011-11-02 ENCOUNTER — Telehealth: Payer: Self-pay | Admitting: Cardiology

## 2011-11-02 MED ORDER — TEMAZEPAM 30 MG PO CAPS: 30.0000 mg | ORAL_CAPSULE | Freq: Every evening | ORAL | Status: DC | PRN

## 2011-11-02 NOTE — Telephone Encounter (Signed)
Pt's dtr calling re pt had total knee replacement, had low BP issues while at the hospital they took him off the BP med, has been off six days now, wants to know when to restart, BP today 124/72 standing, pls call to advise

## 2011-11-02 NOTE — Telephone Encounter (Signed)
Done to last pharmacy med was filled at. [facility] per EMR.

## 2011-11-02 NOTE — Telephone Encounter (Signed)
Spoke with pt dtr, aware to cont to stay off the amlodipine until his bp starts trending high, if it does. She voiced understanding.

## 2011-11-03 ENCOUNTER — Ambulatory Visit: Payer: Self-pay | Admitting: Cardiovascular Disease

## 2011-11-03 DIAGNOSIS — I4891 Unspecified atrial fibrillation: Secondary | ICD-10-CM

## 2011-11-03 LAB — POCT INR: INR: 1.8

## 2011-11-05 ENCOUNTER — Ambulatory Visit: Payer: Self-pay | Admitting: Cardiovascular Disease

## 2011-11-05 ENCOUNTER — Telehealth: Payer: Self-pay | Admitting: *Deleted

## 2011-11-05 ENCOUNTER — Telehealth: Payer: Self-pay | Admitting: Cardiology

## 2011-11-05 DIAGNOSIS — I4891 Unspecified atrial fibrillation: Secondary | ICD-10-CM

## 2011-11-05 DIAGNOSIS — N390 Urinary tract infection, site not specified: Secondary | ICD-10-CM | POA: Diagnosis not present

## 2011-11-05 MED ORDER — CEPHALEXIN 500 MG PO CAPS: 500.0000 mg | ORAL_CAPSULE | Freq: Four times a day (QID) | ORAL | Status: DC

## 2011-11-05 NOTE — Telephone Encounter (Signed)
Spoke with pt dtr, she has recently checked his bp and it was 132/72. she will cont to track this and let me know if cont to be elevated.

## 2011-11-05 NOTE — Telephone Encounter (Signed)
Nurse w/Case Mgt @ [facility] called reporting that patient is experiencing symptoms of UTI; they have collected a urine sample and are requesting a VO to run U/A, obtained per Dr. Rene Paci [in Dr. Debby Bud absence] & given to caller. Upon call back, caller informed that patient is having other health issues since 04.08.13 Knee Sx and reports that they have obtained VO from Dr. [name] for INR and will be having Pt checked today by RN for suspicion of jaundice; they will call our office back for further help, if needed, to see if we can assist in any way/SLS

## 2011-11-05 NOTE — Telephone Encounter (Signed)
ua dip reviewed  Ok to start cephalexin tonight  Would like to still see in am

## 2011-11-05 NOTE — Telephone Encounter (Signed)
[facility] called to inform no blood, high leukocytes, nitrates, bilirubin. Patient is scheduled to see Dr. Jonny Ruiz 11/06/11 in the AM. Please advise if patient should go to ER  Or UC now. Contact number is [phone] Chip Boer, patients daughter). Pharmacy preferred [facility]. [place].

## 2011-11-05 NOTE — Telephone Encounter (Signed)
Informed the patients daughter of prescription sent to the pharmacy

## 2011-11-05 NOTE — Telephone Encounter (Signed)
New Problem:     Patient's daughter called in because her father's blood pressure has gone back up and she was wondering when he should start bacK on his blood pressure medication.  His BP was 142/78 last night.  Was wondering if someone could see her father today to have a blood test or ask Genevieve Norlander to draw his blood and fax his results back to our office.

## 2011-11-06 ENCOUNTER — Ambulatory Visit (INDEPENDENT_AMBULATORY_CARE_PROVIDER_SITE_OTHER): Payer: Medicare Other | Admitting: Internal Medicine

## 2011-11-06 ENCOUNTER — Inpatient Hospital Stay (HOSPITAL_COMMUNITY)
Admission: EM | Admit: 2011-11-06 | Discharge: 2011-11-09 | DRG: 392 | Disposition: A | Discharge status: Home Health | Payer: Medicare Other | Attending: Internal Medicine | Admitting: Internal Medicine

## 2011-11-06 ENCOUNTER — Encounter: Payer: Self-pay | Admitting: Internal Medicine

## 2011-11-06 ENCOUNTER — Inpatient Hospital Stay (HOSPITAL_COMMUNITY): Payer: Medicare Other

## 2011-11-06 ENCOUNTER — Encounter (HOSPITAL_COMMUNITY): Payer: Self-pay | Admitting: Emergency Medicine

## 2011-11-06 VITALS — BP 142/70 | HR 82 | Temp 97.9°F

## 2011-11-06 DIAGNOSIS — A088 Other specified intestinal infections: Principal | ICD-10-CM | POA: Diagnosis present

## 2011-11-06 DIAGNOSIS — R17 Unspecified jaundice: Secondary | ICD-10-CM | POA: Diagnosis present

## 2011-11-06 DIAGNOSIS — R8290 Unspecified abnormal findings in urine: Secondary | ICD-10-CM

## 2011-11-06 DIAGNOSIS — Z9981 Dependence on supplemental oxygen: Secondary | ICD-10-CM | POA: Diagnosis not present

## 2011-11-06 DIAGNOSIS — R4182 Altered mental status, unspecified: Secondary | ICD-10-CM | POA: Diagnosis present

## 2011-11-06 DIAGNOSIS — R7401 Elevation of levels of liver transaminase levels: Secondary | ICD-10-CM | POA: Diagnosis present

## 2011-11-06 DIAGNOSIS — R5381 Other malaise: Secondary | ICD-10-CM | POA: Diagnosis not present

## 2011-11-06 DIAGNOSIS — M1710 Unilateral primary osteoarthritis, unspecified knee: Secondary | ICD-10-CM

## 2011-11-06 DIAGNOSIS — R112 Nausea with vomiting, unspecified: Secondary | ICD-10-CM | POA: Diagnosis present

## 2011-11-06 DIAGNOSIS — E860 Dehydration: Secondary | ICD-10-CM | POA: Diagnosis present

## 2011-11-06 DIAGNOSIS — R82998 Other abnormal findings in urine: Secondary | ICD-10-CM | POA: Diagnosis not present

## 2011-11-06 DIAGNOSIS — Y92009 Unspecified place in unspecified non-institutional (private) residence as the place of occurrence of the external cause: Secondary | ICD-10-CM

## 2011-11-06 DIAGNOSIS — J449 Chronic obstructive pulmonary disease, unspecified: Secondary | ICD-10-CM | POA: Diagnosis present

## 2011-11-06 DIAGNOSIS — Z7901 Long term (current) use of anticoagulants: Secondary | ICD-10-CM

## 2011-11-06 DIAGNOSIS — IMO0002 Reserved for concepts with insufficient information to code with codable children: Secondary | ICD-10-CM | POA: Diagnosis not present

## 2011-11-06 DIAGNOSIS — M179 Osteoarthritis of knee, unspecified: Secondary | ICD-10-CM

## 2011-11-06 DIAGNOSIS — W19XXXA Unspecified fall, initial encounter: Secondary | ICD-10-CM

## 2011-11-06 DIAGNOSIS — I10 Essential (primary) hypertension: Secondary | ICD-10-CM | POA: Diagnosis present

## 2011-11-06 DIAGNOSIS — R109 Unspecified abdominal pain: Secondary | ICD-10-CM | POA: Diagnosis not present

## 2011-11-06 DIAGNOSIS — R197 Diarrhea, unspecified: Secondary | ICD-10-CM | POA: Diagnosis not present

## 2011-11-06 DIAGNOSIS — J9610 Chronic respiratory failure, unspecified whether with hypoxia or hypercapnia: Secondary | ICD-10-CM | POA: Diagnosis present

## 2011-11-06 DIAGNOSIS — Z96659 Presence of unspecified artificial knee joint: Secondary | ICD-10-CM | POA: Diagnosis not present

## 2011-11-06 DIAGNOSIS — K828 Other specified diseases of gallbladder: Secondary | ICD-10-CM | POA: Diagnosis not present

## 2011-11-06 DIAGNOSIS — R7402 Elevation of levels of lactic acid dehydrogenase (LDH): Secondary | ICD-10-CM | POA: Diagnosis present

## 2011-11-06 DIAGNOSIS — I4891 Unspecified atrial fibrillation: Secondary | ICD-10-CM | POA: Diagnosis present

## 2011-11-06 DIAGNOSIS — D649 Anemia, unspecified: Secondary | ICD-10-CM | POA: Diagnosis present

## 2011-11-06 DIAGNOSIS — W010XXA Fall on same level from slipping, tripping and stumbling without subsequent striking against object, initial encounter: Secondary | ICD-10-CM | POA: Diagnosis present

## 2011-11-06 DIAGNOSIS — J4489 Other specified chronic obstructive pulmonary disease: Secondary | ICD-10-CM | POA: Diagnosis present

## 2011-11-06 DIAGNOSIS — R799 Abnormal finding of blood chemistry, unspecified: Secondary | ICD-10-CM | POA: Diagnosis not present

## 2011-11-06 LAB — URINALYSIS, ROUTINE W REFLEX MICROSCOPIC
Ketones, ur: 15 mg/dL — AB
Leukocytes, UA: NEGATIVE
Nitrite: NEGATIVE
Specific Gravity, Urine: 1.023 (ref 1.005–1.030)
pH: 6 (ref 5.0–8.0)

## 2011-11-06 LAB — PROTIME-INR
INR: 2.51 — ABNORMAL HIGH (ref 0.00–1.49)
Prothrombin Time: 27.5 seconds — ABNORMAL HIGH (ref 11.6–15.2)

## 2011-11-06 LAB — CBC
HCT: 29.5 % — ABNORMAL LOW (ref 39.0–52.0)
Hemoglobin: 9.6 g/dL — ABNORMAL LOW (ref 13.0–17.0)
MCH: 30.1 pg (ref 26.0–34.0)
MCHC: 32.5 g/dL (ref 30.0–36.0)
RDW: 15.7 % — ABNORMAL HIGH (ref 11.5–15.5)

## 2011-11-06 LAB — URINE MICROSCOPIC-ADD ON

## 2011-11-06 LAB — COMPREHENSIVE METABOLIC PANEL
BUN: 18 mg/dL (ref 6–23)
Calcium: 8.7 mg/dL (ref 8.4–10.5)
GFR calc Af Amer: 85 mL/min — ABNORMAL LOW (ref >90)
Glucose, Bld: 142 mg/dL — ABNORMAL HIGH (ref 70–99)
Total Protein: 6.3 g/dL (ref 6.0–8.3)

## 2011-11-06 LAB — CK: Total CK: 81 U/L (ref 7–232)

## 2011-11-06 MED ORDER — TRAMADOL HCL 50 MG PO TABS
50.0000 mg | ORAL_TABLET | Freq: Once | ORAL | Status: AC
  Administered 2011-11-06: 50 mg via ORAL
  Filled 2011-11-06: qty 1

## 2011-11-06 MED ORDER — POTASSIUM CHLORIDE IN NACL 20-0.9 MEQ/L-% IV SOLN
INTRAVENOUS | Status: DC
  Administered 2011-11-06 – 2011-11-07 (×2): via INTRAVENOUS
  Filled 2011-11-06 (×4): qty 1000

## 2011-11-06 MED ORDER — SODIUM CHLORIDE 0.9 % IV BOLUS (SEPSIS)
500.0000 mL | Freq: Once | INTRAVENOUS | Status: AC
  Administered 2011-11-06: 500 mL via INTRAVENOUS

## 2011-11-06 MED ORDER — ALBUTEROL SULFATE (5 MG/ML) 0.5% IN NEBU: 2.5000 mg | INHALATION_SOLUTION | RESPIRATORY_TRACT | Status: DC | PRN

## 2011-11-06 MED ORDER — PROMETHAZINE HCL 25 MG/ML IJ SOLN
25.0000 mg | Freq: Four times a day (QID) | INTRAMUSCULAR | Status: DC | PRN
  Administered 2011-11-06: 25 mg via INTRAMUSCULAR

## 2011-11-06 MED ORDER — SODIUM CHLORIDE 0.9 % IJ SOLN: 3.0000 mL | Freq: Two times a day (BID) | INTRAMUSCULAR | Status: DC

## 2011-11-06 MED ORDER — IPRATROPIUM BROMIDE 0.02 % IN SOLN
0.5000 mg | Freq: Four times a day (QID) | RESPIRATORY_TRACT | Status: DC
  Administered 2011-11-06: 0.5 mg via RESPIRATORY_TRACT
  Filled 2011-11-06: qty 2.5

## 2011-11-06 MED ORDER — MEPERIDINE HCL 50 MG/ML IJ SOLN
25.0000 mg | Freq: Once | INTRAMUSCULAR | Status: AC
  Administered 2011-11-06: 25 mg via INTRAMUSCULAR

## 2011-11-06 MED ORDER — FINASTERIDE 5 MG PO TABS
5.0000 mg | ORAL_TABLET | Freq: Every day | ORAL | Status: DC
  Administered 2011-11-06 – 2011-11-09 (×4): 5 mg via ORAL
  Filled 2011-11-06 (×4): qty 1

## 2011-11-06 MED ORDER — ACETAMINOPHEN 325 MG PO TABS
650.0000 mg | ORAL_TABLET | Freq: Four times a day (QID) | ORAL | Status: DC | PRN
  Administered 2011-11-07 – 2011-11-08 (×2): 650 mg via ORAL
  Filled 2011-11-06: qty 2

## 2011-11-06 MED ORDER — WARFARIN SODIUM 3 MG PO TABS
3.0000 mg | ORAL_TABLET | Freq: Once | ORAL | Status: AC
  Administered 2011-11-06: 3 mg via ORAL
  Filled 2011-11-06: qty 1

## 2011-11-06 MED ORDER — WARFARIN - PHARMACIST DOSING INPATIENT: Freq: Every day | Status: DC

## 2011-11-06 MED ORDER — ONDANSETRON HCL 4 MG PO TABS: 4.0000 mg | ORAL_TABLET | Freq: Four times a day (QID) | ORAL | Status: DC | PRN

## 2011-11-06 MED ORDER — TEMAZEPAM 15 MG PO CAPS: 30.0000 mg | ORAL_CAPSULE | Freq: Every evening | ORAL | Status: DC | PRN

## 2011-11-06 MED ORDER — TRAMADOL HCL 50 MG PO TABS
50.0000 mg | ORAL_TABLET | Freq: Four times a day (QID) | ORAL | Status: DC | PRN
  Filled 2011-11-06: qty 1
  Filled 2011-11-06: qty 2

## 2011-11-06 MED ORDER — TRAMADOL HCL 50 MG PO TABS
50.0000 mg | ORAL_TABLET | Freq: Four times a day (QID) | ORAL | Status: DC | PRN
  Administered 2011-11-07 (×2): 50 mg via ORAL
  Administered 2011-11-08 – 2011-11-09 (×4): 100 mg via ORAL
  Filled 2011-11-06 (×4): qty 2

## 2011-11-06 MED ORDER — TAMSULOSIN HCL 0.4 MG PO CAPS
0.4000 mg | ORAL_CAPSULE | Freq: Two times a day (BID) | ORAL | Status: DC
  Administered 2011-11-06 – 2011-11-09 (×6): 0.4 mg via ORAL
  Filled 2011-11-06 (×8): qty 1

## 2011-11-06 MED ORDER — PANTOPRAZOLE SODIUM 40 MG PO TBEC
40.0000 mg | DELAYED_RELEASE_TABLET | Freq: Every day | ORAL | Status: DC
  Administered 2011-11-06 – 2011-11-08 (×3): 40 mg via ORAL
  Filled 2011-11-06 (×4): qty 1

## 2011-11-06 MED ORDER — IPRATROPIUM BROMIDE 0.02 % IN SOLN
0.5000 mg | Freq: Three times a day (TID) | RESPIRATORY_TRACT | Status: DC
  Administered 2011-11-07 – 2011-11-08 (×6): 0.5 mg via RESPIRATORY_TRACT
  Filled 2011-11-06 (×5): qty 2.5

## 2011-11-06 MED ORDER — ATENOLOL 25 MG PO TABS
25.0000 mg | ORAL_TABLET | Freq: Two times a day (BID) | ORAL | Status: DC
  Administered 2011-11-06 – 2011-11-09 (×6): 25 mg via ORAL
  Filled 2011-11-06 (×7): qty 1

## 2011-11-06 MED ORDER — ALBUTEROL SULFATE (5 MG/ML) 0.5% IN NEBU
2.5000 mg | INHALATION_SOLUTION | Freq: Four times a day (QID) | RESPIRATORY_TRACT | Status: DC
  Administered 2011-11-06: 2.5 mg via RESPIRATORY_TRACT
  Filled 2011-11-06: qty 0.5

## 2011-11-06 MED ORDER — ALBUTEROL SULFATE (5 MG/ML) 0.5% IN NEBU
2.5000 mg | INHALATION_SOLUTION | Freq: Three times a day (TID) | RESPIRATORY_TRACT | Status: DC
  Administered 2011-11-07 – 2011-11-08 (×6): 2.5 mg via RESPIRATORY_TRACT
  Filled 2011-11-06 (×6): qty 0.5

## 2011-11-06 MED ORDER — SODIUM CHLORIDE 0.9 % IV SOLN: INTRAVENOUS | Status: AC

## 2011-11-06 MED ORDER — ACETAMINOPHEN 650 MG RE SUPP: 650.0000 mg | Freq: Four times a day (QID) | RECTAL | Status: DC | PRN

## 2011-11-06 MED ORDER — ONDANSETRON HCL 4 MG/2ML IJ SOLN: 4.0000 mg | Freq: Four times a day (QID) | INTRAMUSCULAR | Status: DC | PRN

## 2011-11-06 NOTE — ED Notes (Signed)
Pt had rt knee surgery on 4/8 and has pain to rt knee, n/v elevated bili, fell at 0200 this am called ems and refused transport at this time, went to md office at 0900 and they called to transport gave demerol and phenegran, 20g lt ac saline lock

## 2011-11-06 NOTE — ED Notes (Signed)
[id number]<AV> Expected date:<BR> Expected time:<BR> Means of arrival:<BR> Comments:<BR> Ems/ [place] recent surgey, n/v

## 2011-11-06 NOTE — Progress Notes (Signed)
ED CM noted Cm consult from admission RN Pt active with [facility] for services. ED CM notified [facility] staff of pt's admission Pt will be followed during admission for possible d/c needs

## 2011-11-06 NOTE — ED Notes (Signed)
Per the pt's daughter: pt's urine was "very amber yesterday w/foul odor"  She called PMD and had U/A run.  Was told bili, leukocytes, nitrites were elevated.  Pt was put on abx-had 2 doses last night.  Started to have n/v/d.  At around 0200, pt got oob w/o assistance and fell.  Daughter wasn't able to get him up herself.  States pt had also been incontinent of stool which isn't normal. Once cleaned up, she called EMS but pt didn't want to come to hospital.  They made it to their 0900 PMD appt today and was sent here for possible gallbladder obstruction/liver issues.  Pt denies pain at present.  Daughter states pt is not as yellow as he was yesterday.  RT leg/knee is swollen and slightly red.  Pedal pulses palpable and equal.  Daughter states the wound, which is well approximated, looks good.

## 2011-11-06 NOTE — ED Provider Notes (Signed)
History     CSN: [id number]  Arrival date & time 11/06/11  1100   First MD Initiated Contact with Patient 11/06/11 1106      Chief Complaint  Patient presents with  . Knee Pain  . Abnormal Lab  . Nausea  . Emesis    (Consider location/radiation/quality/duration/timing/severity/associated sxs/prior treatment) The history is provided by the patient, a relative and medical records.  Pt is an 78 y.o M with PMH of HTN, COPD, ?renal insufficiency, paroxysmal a-fib, s/p right TKA by Dr Lequita Halt on 4/8 who presents to the ED with c/c of N/V/D onset around 1:30 this morning. Non-bloody, non-bilious emesis, non-bloody, watery diarrhea. Last emesis some time this morning, last episode diarrhea around 8:30 AM. Per daughter, pt has been doing well since the surgery except for decreased appetite. Yesterday, appeared to be generally weak but was still alert and oriented throughout the day. Attempted to get out of bed around 1:30 this morning without assistance of his walker and fell to the ground. EMS came to the house and assisted pt to bed; he refused transport at that time and denies any injuries sustained in the fall. Daughter suspects patient may have had a fever last night, but they did not check his temperature and they both deny any concern for fever today. There has been no CP, increased SOB from baseline, abdominal pain, numbness or weakness to the extremities (aside from unchanged slight decreased strength to the right leg secondary to recent surgery).  Per medical record, pt had a urinalysis performed yesterday due to daughter's concerns that it was dark in color and had a foul odor. + leuk, nitrite, bili- pt started on keflex and was instructed to present to primary office today. Given his symptoms of N/V combined with reported jaundice, bili in urine, and sig pain secondary to inability to tolerate PO, pt was advised to then present to ED for further evaluation. He was treated with phenergan and  demerol in office with good symptom improvement.  Past Medical History  Diagnosis Date  . INSOMNIA, PERSISTENT 08/22/2008  . DECREASED HEARING 10/26/2007  . HYPERTENSION 07/23/2008  . BRADYCARDIA 04/01/2009  . C O P D 10/26/2007  . RENAL INSUFFICIENCY 04/01/2009  . BENIGN PROSTATIC HYPERTROPHY, WITH OBSTRUCTION 11/21/2007  . ASTEATOTIC ECZEMA 03/29/2008  . INSOMNIA UNSPECIFIED 11/21/2007  . LEG EDEMA, BILATERAL 01/07/2010  . PAROXYSMAL ATRIAL FIBRILLATION 11/21/2007  . Arthritis     knees, shoulders     Past Surgical History  Procedure Date  . Appendectomy     @ 23  . Tonsillectomy   . Hernia repair 01/2011    bilateral inguinal hernia   . Eye surgery     bilateral cataract surgery   . Other surgical history     left finger surgery     Family History  Problem Relation Age of Onset  . Heart disease Father   . Heart attack Father   . Cancer Mother     colon   . Cirrhosis Sister     liver failure  . Heart attack Brother   . Diabetes Neg Hx     History  Substance Use Topics  . Smoking status: Former Smoker    Quit date: 07/21/2007  . Smokeless tobacco: Never Used   Comment: History of very heavy smoking.  Quite about 4 years ago.  . Alcohol Use: Yes     wine occasional       Review of Systems 10 systems reviewed and are negative  for acute change except as noted in the HPI.  Allergies  Review of patient's allergies indicates no known allergies.  Home Medications   Current Outpatient Rx  Name Route Sig Dispense Refill  . AMLODIPINE BESYLATE 5 MG PO TABS Oral Take 5 mg by mouth daily after breakfast.    . ATENOLOL 50 MG PO TABS Oral Take 25 mg by mouth 2 (two) times daily.    . CEPHALEXIN 500 MG PO CAPS Oral Take 1 capsule (500 mg total) by mouth 4 (four) times daily. 40 capsule 0  . ENOXAPARIN SODIUM 30 MG/0.3ML Edie SOLN Subcutaneous Inject 0.3 mLs (30 mg total) into the skin every 12 (twelve) hours. 4 Syringe 0  . FINASTERIDE 5 MG PO TABS Oral Take 5 mg by mouth daily.      Marland Kitchen OMEPRAZOLE 20 MG PO CPDR Oral Take 1 capsule (20 mg total) by mouth daily. 90 capsule 2  . TAMSULOSIN HCL 0.4 MG PO CAPS Oral Take 0.4 mg by mouth 2 (two) times daily.     Marland Kitchen TEMAZEPAM 30 MG PO CAPS Oral Take 1 capsule (30 mg total) by mouth at bedtime as needed for sleep. 30 capsule 0  . TRAMADOL HCL 50 MG PO TABS Oral Take 1-2 tablets (50-100 mg total) by mouth every 6 (six) hours as needed. 80 tablet 0  . WARFARIN SODIUM 3 MG PO TABS Oral Take 0.5-1 tablets (1.5-3 mg total) by mouth daily. 1 tablet every day but on Thursday 0.5 tablet 40 tablet 0    BP 168/73  Pulse 80  Temp(Src) 98.1 F (36.7 C) (Oral)  Resp 23  SpO2 97%  Physical Exam  Nursing note and vitals reviewed. Constitutional: He appears well-developed and well-nourished. No distress.  HENT:  Head: Normocephalic and atraumatic.  Right Ear: External ear normal.  Left Ear: External ear normal.       Mucous membranes dry  Eyes: Conjunctivae are normal. No scleral icterus.       Eccentric right pupil, tadpole-shaped with extension of "tail" medially. Reactive to light. Left pupil round and reactive to light. Sclera anicteric.  Neck: Normal range of motion. Neck supple.  Cardiovascular: Normal rate, regular rhythm, normal heart sounds and intact distal pulses.   Pulmonary/Chest: Effort normal. No respiratory distress. He has no wheezes. He exhibits no tenderness.       Faint bibasilar crackles  Abdominal: Soft. Bowel sounds are normal. He exhibits no distension. There is no hepatomegaly. There is no tenderness. There is no rebound and no guarding.  Musculoskeletal:       RLE with edema and yellow/brown discoloration extending from proximal thigh to lower leg.  Mild pain on palpation of the thigh. No calf tenderness. ROM to this extremity is decreased. Otherwise unremarkable ROS.  Lymphadenopathy:    He has no cervical adenopathy.  Neurological:       Pt is alert, oriented to person, place, events, and somewhat to time.  GCS 15. Sensation intact to light touch and symmetric in distal BLE. Bilateral grip strength, foot plantar/dorsi flexion strength 5/5 and symmetric.  Skin: Skin is warm and dry. There is pallor.       Bruising to the lateral, posterior, and medial aspects of the right thigh. Surgical incision to anterior right knee with steri-strips in place. No wound drainage, no surrounding erythema. Dressing CDI.   Psychiatric: He has a normal mood and affect.    ED Course  Procedures (including critical care time)  Labs Reviewed  CBC -  Abnormal; Notable for the following:    RBC 3.19 (*)    Hemoglobin 9.6 (*)    HCT 29.5 (*)    RDW 15.7 (*)    All other components within normal limits  COMPREHENSIVE METABOLIC PANEL - Abnormal; Notable for the following:    Glucose, Bld 142 (*)    Albumin 3.4 (*)    AST 64 (*)    ALT 165 (*)    Alkaline Phosphatase 170 (*)    Total Bilirubin 2.1 (*)    GFR calc non Af Amer 73 (*)    GFR calc Af Amer 85 (*)    All other components within normal limits  PROTIME-INR - Abnormal; Notable for the following:    Prothrombin Time 27.5 (*)    INR 2.51 (*)    All other components within normal limits  URINALYSIS, ROUTINE W REFLEX MICROSCOPIC   No results found.   1. Dehydration   2. Nausea vomiting and diarrhea   3. Transaminitis       MDM  11:40 AM Pt seen and evaluated. Initial H&P completed. Prior record reviewed. Basic labs, initiation of slow fluid resuscitation. Pain and nausea well-controlled at this time with medication from PMD office. Will continue to monitor.   1:45 PM Pt c/o pain to RLE. Feels able to tolerate PO, ultram ordered. Continuing IVF administration.   2:35 PM Labs have been reviewed. Improving anemia. New transaminitis. Stable renal function. Plan for admit for dehydration/fluid resuscitation and monitoring/further testing regarding transaminitis. No abd pain on exam to suggest biliary pathology.   3:30 PM RUQ [place], acute hepatitis  panel ordered per request of admitting MD.        Shaaron Adler, PA-C 11/07/11 1129

## 2011-11-06 NOTE — Assessment & Plan Note (Signed)
Just received the micro on the UA per [facility] which is negative; doubt has UTI as cause of any of his symtpoms, would recommend hold antibx if only given for this purpose; ua abnormal likely related to juandice/prob acute liver abnormality

## 2011-11-06 NOTE — H&P (Addendum)
PCP:   Illene Regulus, MD, MD   Chief Complaint:  Nausea, vomiting, diarrhea, yellowish discoloration of the skin and fall.  HPI: 78 year old Caucasian male patient with history of chronic respiratory failure, oxygen dependent, COPD, paroxysmal atrial fibrillation on Coumadin anticoagulation, hypertension, discharged on 4/13 from [facility] after right total knee arthroplasty. History is obtained from patient in person and from patient's daughter Ms. Cardell Peach over the phone. 2 days after discharge from hospital she noticed yellowish discoloration of his skin. Subsequently she also noticed amber color discoloration of the urine. Patient however did not have any dysuria or urinary frequency or fever or chills. She was concern for urinary tract infection and insisted that her primary care physician checks his urine for same. The urine was sent for analysis by home health services yesterday which was positive for nitrites and the primary care physician started him on oral Keflex. Approximately midnight last night patient had an episode of vomiting but no coffee grounds or blood. He has been persistently nauseous since. He had 3 episodes of watery brown diarrhea but no blood or mucus. He denies abdominal pain. He may have had a fever last night but temperatures were not checked. He attempted to get up off the bed and use the bathroom and fell forward. He denies hitting his head or any preceding symptoms. He did not lose consciousness. He was unable to get up by himself. EMS was summoned but patient refused to come to the hospital. This morning patient was taken to see the primary care physician and was sent to the emergency department for evaluation of worsening jaundice, nausea and vomiting and diarrhea and unable to keep by mouth meds down. Urine analysis sent yesterday apparently was not convincing for urinary tract infection. Patient complains of mild to moderate right knee pain. There is  significant blackish blue discoloration of the entire right lower extremity from the groin to mid leg but more so on lateral aspect compared to medial. According to the daughter his leg is much less swollen and looks much better than it did 4 days ago. She has been advised by the orthopedic M.D. that the way the leg looks his usual for postoperative changes. No sickly contacts. Patient denies dyspnea or chest pain or palpitations. He denies abdominal pain. His appetite has been poor since recent hospitalization. According to his daughter patient is confused and had periods of sundowning during recent hospitalization. His amlodipine has been held secondary to soft blood pressures.   Past Medical History: Past Medical History  Diagnosis Date  . INSOMNIA, PERSISTENT 08/22/2008  . DECREASED HEARING 10/26/2007  . HYPERTENSION 07/23/2008  . BRADYCARDIA 04/01/2009  . C O P D 10/26/2007  . RENAL INSUFFICIENCY 04/01/2009  . BENIGN PROSTATIC HYPERTROPHY, WITH OBSTRUCTION 11/21/2007  . ASTEATOTIC ECZEMA 03/29/2008  . INSOMNIA UNSPECIFIED 11/21/2007  . LEG EDEMA, BILATERAL 01/07/2010  . PAROXYSMAL ATRIAL FIBRILLATION 11/21/2007  . Arthritis     knees, shoulders   . Chronic respiratory failure     Past Surgical History: Past Surgical History  Procedure Date  . Appendectomy     @ 23  . Tonsillectomy   . Hernia repair 01/2011    bilateral inguinal hernia   . Eye surgery     bilateral cataract surgery   . Other surgical history     left finger surgery   . Knee surgery right    Allergies:  No Known Allergies  Medications: Prior to Admission medications   Medication Sig Start  Date End Date Taking? Authorizing Provider  amLODipine (NORVASC) 5 MG tablet Take 5 mg by mouth daily after breakfast.   Yes Historical Provider, MD  atenolol (TENORMIN) 50 MG tablet Take 25 mg by mouth 2 (two) times daily.   Yes Historical Provider, MD  cephALEXin (KEFLEX) 500 MG capsule Take 1 capsule (500 mg total) by mouth 4 (four)  times daily. 11/05/11 11/15/11 Yes Corwin Levins, MD  finasteride (PROSCAR) 5 MG tablet Take 5 mg by mouth daily.    Yes Historical Provider, MD  omeprazole (PRILOSEC) 20 MG capsule Take 1 capsule (20 mg total) by mouth daily. 07/24/11  Yes Jacques Navy, MD  Tamsulosin HCl (FLOMAX) 0.4 MG CAPS Take 0.4 mg by mouth 2 (two) times daily.    Yes Historical Provider, MD  temazepam (RESTORIL) 30 MG capsule Take 1 capsule (30 mg total) by mouth at bedtime as needed for sleep. 11/02/11  Yes Georgina Quint Plotnikov, MD  traMADol (ULTRAM) 50 MG tablet Take 1-2 tablets (50-100 mg total) by mouth every 6 (six) hours as needed. 10/30/11 11/09/11 Yes Alexzandrew Julien Girt, PA  warfarin (COUMADIN) 3 MG tablet Take 0.5-1 tablets (1.5-3 mg total) by mouth daily. 1 tablet every day but on Thursday 0.5 tablet 10/30/11  Yes Alexzandrew Julien Girt, PA  enoxaparin (LOVENOX) 30 MG/0.3ML injection Inject 0.3 mLs (30 mg total) into the skin every 12 (twelve) hours. 10/30/11   Alexzandrew Julien Girt, PA    Family History: Family History  Problem Relation Age of Onset  . Heart disease Father   . Heart attack Father   . Cancer Mother     colon   . Cirrhosis Sister     liver failure  . Heart attack Brother   . Diabetes Neg Hx     Social History:  reports that he quit smoking about 4 years ago. He has never used smokeless tobacco. He reports that he drinks alcohol. He reports that he does not use illicit drugs. patient currently lives with his daughter who is a retired Engineer, civil (consulting) and ambulates with the help of a walker and has home health services following him.   Review of Systems:  all systems reviewed and apart from history of presenting illness are negative.   Physical Exam: Filed Vitals:   11/06/11 1108 11/06/11 1252 11/06/11 1627  BP: 168/73 166/79 160/69  Pulse: 80 76   Temp: 98.1 F (36.7 C) 99.2 F (37.3 C)   TempSrc: Oral Oral   Resp: 23 21 14   SpO2: 97% 96% 92%   General appearance: Moderately built and nourished  male patient who is lying comfortably  on the gurney and is in no obvious distress.  Head: Nontraumatic and normocephalic.  Eyes:  right pupil is slightly and left is 3 mm reacting to light. Bilateral arcus senilis.  Ears: Normal  Nose: No acute findings. No sinus tenderness.  Throat: Mucosa is Dry . No oral thrush or coffee grounds or blood .  Neck: Supple. No JVD or carotid bruit. Lymph nodes: No lymphadenopathy.  Resp: Clear to auscultation. No increased work of breathing.  Cardio: First and second heart sounds heard, regular rate and rythm. No murmurs or JVD or gallop or pedal edema.   GI: Non distended. Soft and nontender. No organomegaly or masses appreciated. Normal bowel sounds heard.  Extremities:  right lower extremity is diffusely swollen from the groin down to the mid leg. Extensive bruising especially on the lateral aspect extending from the hip to upper leg and medially from  groin to mid thigh. There is yellowish-green discoloration of rest of the skin of the thigh. Surgical wound on the knee is clean dry and intact. No excessive warmth or erythema or drainage. Good peripheral pulses felt distally. Able to move the right lower extremity but in moderate pain overall. Left lower extremity without acute findings.  Skin: No other acute findings.  Neurologic: Alert and oriented to person, place and partly to time. No focal neurological deficits.   Labs on Admission:   [facility] 11/06/11 1145  NA 135  K 3.5  CL 101  CO2 24  GLUCOSE 142*  BUN 18  CREATININE 0.93  CALCIUM 8.7  MG --  PHOS --    Basename 11/06/11 1145  AST 64*  ALT 165*  ALKPHOS 170*  BILITOT 2.1*  PROT 6.3  ALBUMIN 3.4*   No results found for this basename: LIPASE:2,AMYLASE:2 in the last 72 hours  Basename 11/06/11 1145  WBC 6.2  NEUTROABS --  HGB 9.6*  HCT 29.5*  MCV 92.5  PLT 332   No results found for this basename: CKTOTAL:3,CKMB:3,CKMBINDEX:3,TROPONINI:3 in the last 72 hours No results  found for this basename: TSH,T4TOTAL,FREET3,T3FREE,THYROIDAB in the last 72 hours No results found for this basename: VITAMINB12:2,FOLATE:2,FERRITIN:2,TIBC:2,IRON:2,RETICCTPCT:2 in the last 72 hours  Radiological Exams on Admission: No results found.    Assessment/Plan Present on Admission:  .Nausea, vomiting and diarrhea .Jaundice .C O P D .HYPERTENSION .PAROXYSMAL ATRIAL FIBRILLATION .Anemia .Altered mental state  1. Nausea, vomiting and diarrhea: Possibly from acute viral gastroenteritis. Patient denies abdominal pain. Rule out C. difficile colitis although seems less likely. Elevated LFTs may or may not be related to the symptoms. Will check C. difficile PCR. Place on contact isolation. We'll check acute hepatitis panel and right upper quadrant ultrasound. Diet as tolerated. Anti-emetics. 2. Dehydration: Secondary to problem #1 and poor oral intake. Brief IV fluid hydration. 3. Jaundice with mild transaminitis: Apart from nausea vomiting and diarrhea patient does not have any abdominal pain. This may be secondary to hemolysis from extensive postoperative bruising of right lower extremity. In any event will check right upper quadrant ultrasound, acute hepatitis panel, total CK for rhabdomyolysis and differential bilirubin tomorrow morning. We'll monitor daily hepatic panel. 4. Anemia: Hemoglobin is stable compared to recent hospital discharge. However this may drop following hydration. Follow up CBC tomorrow. 5. Atrial fibrillation: Currently in sinus rhythm. Anticoagulated on Coumadin. Coumadin per pharmacy. 6. Recent right total knee arthroplasty with extensive postop bruising in the right lower extremity: Consulted primary orthopedic surgeon who indicates that the bruising is not unusual occurrence after the surgery. They will see the patient and make recommendations. 7. Altered mental status: Possibly secondary to acute medical illness complicating underlying? Dementia. 8. Chronic  respiratory failure secondary to COPD: Continue oxygen and add bronchodilator nebulizations. Stable and asymptomatic. 9. Hypertension: Continue atenolol. Amlodipine is currently on hold and may be initiated if blood pressures are not adequately controlled. 10. Full code. This was confirmed with the patient and patient's daughter/health care power of attorney Ms. Reed was updated  Left message with Dr. Debby Bud service to assume primary care from 4/20.  Discussed at length with the patient's daughter Ms. Cardell Peach, updated care and answered questions.   Nancy Manuele 11/06/2011, 5:15 PM

## 2011-11-06 NOTE — Progress Notes (Signed)
Addended by: Deatra James on: 11/06/2011 11:13 AM   Modules accepted: Orders

## 2011-11-06 NOTE — ED Provider Notes (Signed)
He began to have vomiting and diarrhea during the night. He has not eaten anything, nor been able to take any of his medications today. He saw his PCP today and was treated with IM medication. Patient denies nausea, or abdominal pain, now. Heart has regular rhythm without murmur. Lungs are clear to auscultation. Abdomen is soft and nontender. Right leg has moderately swollen thigh and is extremely tender and warm to the touch. The right knee is swollen. They're healing wounds, consistent with his recent total knee replaced. Sensation, and circulation intact in the right foot.        Medical screening examination/treatment/procedure(s) were conducted as a shared visit with non-physician practitioner(s) and myself.  I personally evaluated the patient during the encounter  Flint Melter, MD 11/06/11 1723

## 2011-11-06 NOTE — Assessment & Plan Note (Addendum)
Unclear etiology, could be related I suppose to the cephalexin, but I suspect not given the worsening jaundice recent, will need urgent complete evaluation including LFT, labs , possible CT, GI or heme evaluation ; gave phenergan 25 mg (with demeraol 25 mg now in the office), EMS called and daughter/pt agree for transport to [facility] ER  Note: time for pt hx and exam, review of record with pt and family in the office, determination of diagnosis, and need for further eval and tx is > 40 min

## 2011-11-06 NOTE — Progress Notes (Signed)
Subjective:    Patient ID: Ricardo Perkins, male    DOB: 22-Oct-1922, 78 y.o.   MRN: [id number]  HPI  78yo WM here with daughter in f/u after calling yesterday, and UA yest at [facility] lab with +nitrite/leu est (micro pending); I called in cephalexin last PM and he took 2 doses;  No fever, but urine has been "dark" and further hx reveals worsening juandice since the day after d/c from recent right knee TKR (was hosp apr 8-13) per Dr Lequita Halt.  Last night with onset persistent n/v, loose stools/diarrhea, and a fall apparently without signficant injury, but has essentially not been able to take any po meds since late last night including his pain meds, and is in severe pain after daughter valiantly struggled to get him to the office without pain med this am.  Pt was seem per EMR at home after the fall last pm, but refused going to the ER.  Still currently unable to take po meds or liquids at this time Past Medical History  Diagnosis Date  . INSOMNIA, PERSISTENT 08/22/2008  . DECREASED HEARING 10/26/2007  . HYPERTENSION 07/23/2008  . BRADYCARDIA 04/01/2009  . C O P D 10/26/2007  . RENAL INSUFFICIENCY 04/01/2009  . BENIGN PROSTATIC HYPERTROPHY, WITH OBSTRUCTION 11/21/2007  . ASTEATOTIC ECZEMA 03/29/2008  . INSOMNIA UNSPECIFIED 11/21/2007  . LEG EDEMA, BILATERAL 01/07/2010  . PAROXYSMAL ATRIAL FIBRILLATION 11/21/2007  . Arthritis     knees, shoulders    Past Surgical History  Procedure Date  . Appendectomy     @ 23  . Tonsillectomy   . Hernia repair 01/2011    bilateral inguinal hernia   . Eye surgery     bilateral cataract surgery   . Other surgical history     left finger surgery     reports that he quit smoking about 4 years ago. He has never used smokeless tobacco. He reports that he drinks alcohol. He reports that he does not use illicit drugs. family history includes Cancer in his mother; Cirrhosis in his sister; Heart attack in his brother and father; and Heart disease in his father.  There is no  history of Diabetes. No Known Allergies Current Outpatient Prescriptions on File Prior to Visit  Medication Sig Dispense Refill  . amLODipine (NORVASC) 5 MG tablet Take 5 mg by mouth daily after breakfast.      . atenolol (TENORMIN) 50 MG tablet Take 25 mg by mouth 2 (two) times daily.      . cephALEXin (KEFLEX) 500 MG capsule Take 1 capsule (500 mg total) by mouth 4 (four) times daily.  40 capsule  0  . enoxaparin (LOVENOX) 30 MG/0.3ML injection Inject 0.3 mLs (30 mg total) into the skin every 12 (twelve) hours.  4 Syringe  0  . finasteride (PROSCAR) 5 MG tablet Take 5 mg by mouth daily.       Marland Kitchen omeprazole (PRILOSEC) 20 MG capsule Take 1 capsule (20 mg total) by mouth daily.  90 capsule  2  . Tamsulosin HCl (FLOMAX) 0.4 MG CAPS Take 0.4 mg by mouth 2 (two) times daily.       . temazepam (RESTORIL) 30 MG capsule Take 1 capsule (30 mg total) by mouth at bedtime as needed for sleep.  30 capsule  0  . traMADol (ULTRAM) 50 MG tablet Take 1-2 tablets (50-100 mg total) by mouth every 6 (six) hours as needed.  80 tablet  0  . warfarin (COUMADIN) 3 MG tablet Take 0.5-1 tablets (  1.5-3 mg total) by mouth daily. 1 tablet every day but on Thursday 0.5 tablet  40 tablet  0    Review of Systems Pt really unable to due pain, but states no other problems; no cp, sob, chills, dysuria     Objective:   Physical Exam BP 142/70  Pulse 82  Temp(Src) 97.9 F (36.6 C) (Oral)  SpO2 98% Physical Exam  VS noted, pt examined in wheelchair, unable to stand or move essentially due to knee pain Constitutional: Pt appears well-developed and well-nourished.  HENT: Head: Normocephalic.  Right Ear: External ear normal.  Left Ear: External ear normal.  Eyes: Conjunctivae and EOM are normal. Pupils are equal, round, and reactive to light.  Neck: Normal range of motion. Neck supple.  Cardiovascular: Normal rate and regular rhythm.   Pulmonary/Chest: Effort normal and breath sounds normal.  Abd:  Soft, NT,  non-distended, + BS Neurological: Pt is alert. No cranial nerve deficit.  Skin: Skin is warm. No erythema. but juandiced  Assessment & Plan:

## 2011-11-06 NOTE — Progress Notes (Signed)
ANTICOAGULATION CONSULT NOTE - Initial Consult  Pharmacy Consult for Warfarin Indication: Hx of Afib  No Known Allergies  Patient Measurements: Height: 5\' 8"  (172.7 cm) Weight: 186 lb 8.2 oz (84.6 kg) (bed scale, pt unable to stand) IBW/kg (Calculated) : 68.4   Vital Signs: Temp: 98.1 F (36.7 C) (04/19 1814) Temp src: Oral (04/19 1814) BP: 169/72 mmHg (04/19 1814) Pulse Rate: 74  (04/19 1814)  Labs:  Basename 11/06/11 1145 11/05/11  HGB 9.6* --  HCT 29.5* --  PLT 332 --  APTT -- --  LABPROT 27.5* --  INR 2.51* 2.9  HEPARINUNFRC -- --  CREATININE 0.93 --  CKTOTAL 81 --  CKMB -- --  TROPONINI -- --   Estimated Creatinine Clearance: 59.3 ml/min (by C-G formula based on Cr of 0.93).  Medical History: Past Medical History  Diagnosis Date  . INSOMNIA, PERSISTENT 08/22/2008  . DECREASED HEARING 10/26/2007  . HYPERTENSION 07/23/2008  . BRADYCARDIA 04/01/2009  . C O P D 10/26/2007  . RENAL INSUFFICIENCY 04/01/2009  . BENIGN PROSTATIC HYPERTROPHY, WITH OBSTRUCTION 11/21/2007  . ASTEATOTIC ECZEMA 03/29/2008  . INSOMNIA UNSPECIFIED 11/21/2007  . LEG EDEMA, BILATERAL 01/07/2010  . PAROXYSMAL ATRIAL FIBRILLATION 11/21/2007  . Arthritis     knees, shoulders   . Chronic respiratory failure     Medications:  Scheduled:    . sodium chloride   Intravenous STAT  . albuterol  2.5 mg Nebulization Q6H  . atenolol  25 mg Oral BID  . finasteride  5 mg Oral Daily  . ipratropium  0.5 mg Nebulization Q6H  . pantoprazole  40 mg Oral Q1200  . sodium chloride  500 mL Intravenous Once  . sodium chloride  500 mL Intravenous Once  . sodium chloride  500 mL Intravenous Once  . sodium chloride  500 mL Intravenous Once  . sodium chloride  3 mL Intravenous Q12H  . Tamsulosin HCl  0.4 mg Oral BID  . traMADol  50 mg Oral Once   Infusions:    . 0.9 % NaCl with KCl 20 mEq / L 60 mL/hr at 11/06/11 1857   PRN: acetaminophen, acetaminophen, albuterol, ondansetron (ZOFRAN) IV, ondansetron, temazepam,  traMADol, traMADol  Assessment: 78 yo M admitted 4/19 with nausea, vomiting, and diarrhea. Pt is s/p R TKA on 10/26/11. On chronic warfarin for hx of Afib. Home warfarin dose reported as 3mg  daily except 1.5mg  on Thursdays. Last dose taken 4/17 per patient report. INR is therapeutic. Will continue with usual home dosing regimen for now.  Goal of Therapy:  INR 2-3   Plan:  1) Warfarin 3mg  PO x1 tonight 2) F/U daily INR  Darrol Angel, PharmD Pager: [phone] 11/06/2011,7:03 PM

## 2011-11-06 NOTE — Assessment & Plan Note (Addendum)
S/p right TKR recent per Dr Lequita Halt, on coumadin, INR yest adequate;  to f/u any worsening symptoms or concerns, will need parenteral pain control; note ; pt himself very reluctant to consider admit to hosp for unclear reasons - ? confusion

## 2011-11-06 NOTE — Patient Instructions (Signed)
You had the Demerol/phenergan 25/25 mg shot today No need to try to take any medicaitons by mouth for now EMS has been called to help transport you to the ER at [facility] should be seen for evalaution for persistent nausea/vomiting and jaundice after knee surgury

## 2011-11-06 NOTE — Assessment & Plan Note (Signed)
Unclear etiology, but I suspect likely related to the n/v, as above - to ER for further eval as he is essentialy unable to take po

## 2011-11-07 ENCOUNTER — Inpatient Hospital Stay (HOSPITAL_COMMUNITY): Payer: Medicare Other

## 2011-11-07 LAB — HEPATIC FUNCTION PANEL
Alkaline Phosphatase: 122 U/L — ABNORMAL HIGH (ref 39–117)
Indirect Bilirubin: 1.1 mg/dL — ABNORMAL HIGH (ref 0.3–0.9)
Total Bilirubin: 1.6 mg/dL — ABNORMAL HIGH (ref 0.3–1.2)
Total Protein: 5 g/dL — ABNORMAL LOW (ref 6.0–8.3)

## 2011-11-07 LAB — PROTIME-INR: Prothrombin Time: 28.4 seconds — ABNORMAL HIGH (ref 11.6–15.2)

## 2011-11-07 LAB — BASIC METABOLIC PANEL
CO2: 23 mEq/L (ref 19–32)
Calcium: 8 mg/dL — ABNORMAL LOW (ref 8.4–10.5)
Creatinine, Ser: 1.06 mg/dL (ref 0.50–1.35)
Glucose, Bld: 108 mg/dL — ABNORMAL HIGH (ref 70–99)

## 2011-11-07 LAB — CBC
HCT: 25.4 % — ABNORMAL LOW (ref 39.0–52.0)
MCHC: 31.9 g/dL (ref 30.0–36.0)
RDW: 16.5 % — ABNORMAL HIGH (ref 11.5–15.5)

## 2011-11-07 MED ORDER — WARFARIN SODIUM 2.5 MG PO TABS
2.5000 mg | ORAL_TABLET | Freq: Once | ORAL | Status: AC
  Administered 2011-11-07: 2.5 mg via ORAL
  Filled 2011-11-07: qty 1

## 2011-11-07 NOTE — Progress Notes (Signed)
Subjective: Pt states he feels well and wants to go home. No complaints of knee/RLE pain No vomiting last night   Objective: Vital signs in last 24 hours: Temp:  [97.9 F (36.6 C)-99.7 F (37.6 C)] 98.2 F (36.8 C) (04/20 0454) Pulse Rate:  [62-82] 62  (04/20 0454) Resp:  [14-23] 20  (04/20 0454) BP: (142-175)/(68-83) 156/74 mmHg (04/20 0454) SpO2:  [92 %-98 %] 97 % (04/20 0454) FiO2 (%):  [4 %] 4 % (04/19 1814) Weight:  [84.6 kg (186 lb 8.2 oz)] 84.6 kg (186 lb 8.2 oz) (04/19 1814)  Intake/Output from previous day: 04/19 0701 - 04/20 0700 In: 1743 [I.V.:1743] Out: 375 [Urine:375] Intake/Output this shift: Total I/O In: -  Out: 200 [Urine:200]   Basename 11/07/11 0449 11/06/11 1145  HGB 8.1* 9.6*    Basename 11/07/11 0449 11/06/11 1145  WBC 4.0 6.2  RBC 2.72* 3.19*  HCT 25.4* 29.5*  PLT 267 332    Basename 11/07/11 0449 11/06/11 1145  NA 139 135  K 3.5 3.5  CL 108 101  CO2 23 24  BUN 15 18  CREATININE 1.06 0.93  GLUCOSE 108* 142*  CALCIUM 8.0* 8.7    Basename 11/07/11 0449 11/06/11 1145  LABPT -- --  INR 2.62* 2.51*    Neurologically intact Dorsiflexion/Plantar flexion intact Compartment soft Has bruising Right thigh with mild/mod swelling. Probably secondary to tourniquet use during surgery coupled with post-op anticoagulation. This is not an unusual amount of bruising or swelling for 12 days post-op.  Assessment/Plan: S/P right TKA- Knee looks fine with no signs infection. Thigh/RLE swelling and bruising is not unusual. I can't attribute his GI abnormalities and elevated bilirubin/transaminases to this. Will follow with you while in hospital and he has a follow up in my office on 4/23   Adiel Erney V 11/07/2011, 8:34 AM

## 2011-11-07 NOTE — Progress Notes (Signed)
Occupational Therapy Note Chart reviewed. Spoke with patient who states he did fine after his knee surgery with his ADL at home. He states he doesn't feel he needs OT currently and has DME. Will defer eval per pt request. Judithann Sauger OTR/L [phone] 11/07/2011

## 2011-11-07 NOTE — Progress Notes (Signed)
Subjective: No nausea vomiting or diarrhea. He's tolerating oral intake. He states she's ambulating without difficulty. Patient recently has been seen by orthopedist.  Objective: Vital signs in last 24 hours: Temp:  [97.9 F (36.6 C)-99.7 F (37.6 C)] 98.2 F (36.8 C) (04/20 0454) Pulse Rate:  [62-78] 62  (04/20 0454) Resp:  [14-22] 20  (04/20 0454) BP: (152-175)/(68-83) 156/74 mmHg (04/20 0454) SpO2:  [88 %-97 %] 88 % (04/20 1019) FiO2 (%):  [4 %] 4 % (04/19 1814) Weight:  [84.6 kg (186 lb 8.2 oz)] 84.6 kg (186 lb 8.2 oz) (04/19 1814) Weight change:  Last BM Date: 11/06/11  Intake/Output from previous day: 04/19 0701 - 04/20 0700 In: 1743 [I.V.:1743] Out: 375 [Urine:375] Intake/Output this shift: Total I/O In: -  Out: 201 [Urine:200; Stool:1]  General appearance: alert and cooperative Resp: clear to auscultation bilaterally Cardio: regular rate and rhythm, S1, S2 normal, no murmur, click, rub or gallop Extremities: Right lower extremity edema, patient would not allow examination of leg  Lab Results:  Results for orders placed during the hospital encounter of 11/06/11 (from the past 24 hour(s))  URINALYSIS, ROUTINE W REFLEX MICROSCOPIC     Status: Abnormal   Collection Time   11/06/11  1:06 PM      Component Value Range   Color, Urine AMBER (*) YELLOW    APPearance CLEAR  CLEAR    Specific Gravity, Urine 1.023  1.005 - 1.030    pH 6.0  5.0 - 8.0    Glucose, UA NEGATIVE  NEGATIVE (mg/dL)   Hgb urine dipstick NEGATIVE  NEGATIVE    Bilirubin Urine SMALL (*) NEGATIVE    Ketones, ur 15 (*) NEGATIVE (mg/dL)   Protein, ur 30 (*) NEGATIVE (mg/dL)   Urobilinogen, UA 1.0  0.0 - 1.0 (mg/dL)   Nitrite NEGATIVE  NEGATIVE    Leukocytes, UA NEGATIVE  NEGATIVE   URINE MICROSCOPIC-ADD ON     Status: Abnormal   Collection Time   11/06/11  1:06 PM      Component Value Range   Squamous Epithelial / LPF FEW (*) RARE    Bacteria, UA RARE  RARE   CBC     Status: Abnormal   Collection  Time   11/07/11  4:49 AM      Component Value Range   WBC 4.0  4.0 - 10.5 (K/uL)   RBC 2.72 (*) 4.22 - 5.81 (MIL/uL)   Hemoglobin 8.1 (*) 13.0 - 17.0 (g/dL)   HCT 16.1 (*) 09.6 - 52.0 (%)   MCV 93.4  78.0 - 100.0 (fL)   MCH 29.8  26.0 - 34.0 (pg)   MCHC 31.9  30.0 - 36.0 (g/dL)   RDW 04.5 (*) 40.9 - 15.5 (%)   Platelets 267  150 - 400 (K/uL)  BASIC METABOLIC PANEL     Status: Abnormal   Collection Time   11/07/11  4:49 AM      Component Value Range   Sodium 139  135 - 145 (mEq/L)   Potassium 3.5  3.5 - 5.1 (mEq/L)   Chloride 108  96 - 112 (mEq/L)   CO2 23  19 - 32 (mEq/L)   Glucose, Bld 108 (*) 70 - 99 (mg/dL)   BUN 15  6 - 23 (mg/dL)   Creatinine, Ser 8.11  0.50 - 1.35 (mg/dL)   Calcium 8.0 (*) 8.4 - 10.5 (mg/dL)   GFR calc non Af Amer 61 (*) >90 (mL/min)   GFR calc Af Amer 71 (*) >90 (mL/min)  HEPATIC FUNCTION PANEL     Status: Abnormal   Collection Time   11/07/11  4:49 AM      Component Value Range   Total Protein 5.0 (*) 6.0 - 8.3 (g/dL)   Albumin 2.6 (*) 3.5 - 5.2 (g/dL)   AST 68 (*) 0 - 37 (U/L)   ALT 141 (*) 0 - 53 (U/L)   Alkaline Phosphatase 122 (*) 39 - 117 (U/L)   Total Bilirubin 1.6 (*) 0.3 - 1.2 (mg/dL)   Bilirubin, Direct 0.5 (*) 0.0 - 0.3 (mg/dL)   Indirect Bilirubin 1.1 (*) 0.3 - 0.9 (mg/dL)  PROTIME-INR     Status: Abnormal   Collection Time   11/07/11  4:49 AM      Component Value Range   Prothrombin Time 28.4 (*) 11.6 - 15.2 (seconds)   INR 2.62 (*) 0.00 - 1.49   CLOSTRIDIUM DIFFICILE BY PCR     Status: Normal   Collection Time   11/07/11  9:12 AM      Component Value Range   C difficile by pcr NEGATIVE  NEGATIVE       Studies/Results: US Abdomen Complete  11/06/2011   *RADIOLOGY REPORT*  Clinical Data:  Abdominal pain.  COMPLETE ABDOMINAL ULTRASOUND  Comparison:  CT of the abdomen pelvis 12/13/2010  Findings:  Gallbladder:  Gallbladder contains a small amount of sludge. Gallbladder wall is 1.8 mm in thickness.  No sonographic Murphy's sign.   Common bile duct:  Common bile duct is 5.6 mm.  Liver:  The liver is echo dense without evidence for focal lesion.  IVC:  Appears normal.  Pancreas:  There is limited evaluation of the pancreas because of overlying bowel gas.  Spleen:  The spleen is normal in appearance, 9.1 cm.  Right Kidney:  The right kidney is 9.8 cm in length.  Thin renal parenchyma.  No pelvicaliectasis.  Left Kidney:  The left kidney is 10.0 cm in length.  Thin renal parenchyma.  No pelvicaliectasis.  Abdominal aorta:  Maximum diameter of the visualized abdominal aorta is 2.7 cm.  Abdominal aortic aneurysm which has been previously noted is not seen on today's exam.  IMPRESSION:  1.  Gallbladder sludge without other evidence for acute cholecystitis. 2.  Bilateral renal parenchymal thinning. 3.  No definite abdominal aortic aneurysm.  Original Report Authenticated By: Patterson Hammersmith, M.D.    Medications:  Prior to Admission:  Prescriptions prior to admission  Medication Sig Dispense Refill  . amLODipine (NORVASC) 5 MG tablet Take 5 mg by mouth daily after breakfast.      . atenolol (TENORMIN) 50 MG tablet Take 25 mg by mouth 2 (two) times daily.      . cephALEXin (KEFLEX) 500 MG capsule Take 1 capsule (500 mg total) by mouth 4 (four) times daily.  40 capsule  0  . finasteride (PROSCAR) 5 MG tablet Take 5 mg by mouth daily.       Marland Kitchen omeprazole (PRILOSEC) 20 MG capsule Take 1 capsule (20 mg total) by mouth daily.  90 capsule  2  . Tamsulosin HCl (FLOMAX) 0.4 MG CAPS Take 0.4 mg by mouth 2 (two) times daily.       . temazepam (RESTORIL) 30 MG capsule Take 1 capsule (30 mg total) by mouth at bedtime as needed for sleep.  30 capsule  0  . traMADol (ULTRAM) 50 MG tablet Take 1-2 tablets (50-100 mg total) by mouth every 6 (six) hours as needed.  80 tablet  0  .  warfarin (COUMADIN) 3 MG tablet Take 0.5-1 tablets (1.5-3 mg total) by mouth daily. 1 tablet every day but on Thursday 0.5 tablet  40 tablet  0  . enoxaparin (LOVENOX) 30  MG/0.3ML injection Inject 0.3 mLs (30 mg total) into the skin every 12 (twelve) hours.  4 Syringe  0   Scheduled:   . sodium chloride   Intravenous STAT  . albuterol  2.5 mg Nebulization TID  . atenolol  25 mg Oral BID  . finasteride  5 mg Oral Daily  . ipratropium  0.5 mg Nebulization TID  . pantoprazole  40 mg Oral Q1200  . sodium chloride  500 mL Intravenous Once  . sodium chloride  500 mL Intravenous Once  . sodium chloride  500 mL Intravenous Once  . sodium chloride  500 mL Intravenous Once  . sodium chloride  3 mL Intravenous Q12H  . Tamsulosin HCl  0.4 mg Oral BID  . traMADol  50 mg Oral Once  . warfarin  2.5 mg Oral ONCE-1800  . warfarin  3 mg Oral Once  . Warfarin - Pharmacist Dosing Inpatient   Does not apply q1800  . DISCONTD: albuterol  2.5 mg Nebulization Q6H  . DISCONTD: ipratropium  0.5 mg Nebulization Q6H   Continuous:   . 0.9 % NaCl with KCl 20 mEq / L 60 mL/hr at 11/06/11 1857    Assessment/Plan: Nausea vomiting diarrhea, probable community-acquired gastroenteritis resolved. Advance diet as tolerated Anemia, currently no signs of blood loss, this could be dilutional effect from IV fluids. Check followup CBC COPD  LOS: 1 day   Phillippa Straub D 11/07/2011, 11:56 AM

## 2011-11-07 NOTE — Evaluation (Signed)
Physical Therapy Evaluation Patient Details Name: Ricardo Perkins MRN: [id number] DOB: 1922-12-16 Today's Date: 11/07/2011 Time: 4098-1191 PT Time Calculation (min): 18 min  PT Assessment / Plan / Recommendation Clinical Impression  78 y.o. male admitted with N/V/D.  Pt had recent R TKA here at [facility] and was staying at daughter's home where he received HHPT for his R TKA. Current R knee flexion is limited, at approx 40 degrees. Pt does well with ambulation. Expect pt can DC to daughter's home and resume HHPT or outpt PT.    PT Assessment  Patient needs continued PT services    Follow Up Recommendations  Home health PT;Outpatient PT    Equipment Recommendations  None recommended by PT    Frequency Min 6X/week    Precautions / Restrictions Precautions Precautions: Knee Restrictions RLE Partial Weight Bearing Percentage or Pounds: pt stated he is WBAT   Pertinent Vitals/Pain **no c/o pain*      Mobility  Bed Mobility Bed Mobility: Supine to Sit Supine to Sit: 6: Modified independent (Device/Increase time);HOB flat;With rails Transfers Transfers: Sit to Stand Sit to Stand: 6: Modified independent (Device/Increase time);With upper extremity assist;From bed Stand to Sit: 6: Modified independent (Device/Increase time);With upper extremity assist;To chair/3-in-1 Ambulation/Gait Ambulation/Gait Assistance: 6: Modified independent (Device/Increase time) Ambulation Distance (Feet): 250 Feet Assistive device: Rolling walker Ambulation/Gait Assistance Details: assist to manage O2 tank and IV pole, pt does well with ambulation, no LOB, denied pain with ambulation Gait Pattern: Within Functional Limits Stairs: No Wheelchair Mobility Wheelchair Mobility: No    Exercises Total Joint Exercises Heel Slides: AAROM;15 reps;Supine;Right Long Arc Quad: 15 reps;AAROM;Seated;Right Knee Flexion: AAROM;Right;10 reps;Seated   PT Goals Acute Rehab PT Goals PT Goal Formulation: With  patient Time For Goal Achievement: 11/14/11 Pt will Ambulate: >150 feet;with modified independence;with rolling walker PT Goal: Ambulate - Progress: Goal set today Pt will Go Up / Down Stairs: 3-5 stairs;with supervision;with rail(s) PT Goal: Up/Down Stairs - Progress: Goal set today Pt will Perform Home Exercise Program: Independently PT Goal: Perform Home Exercise Program - Progress: Goal set today  Visit Information  Last PT Received On: 11/07/11 Assistance Needed: +1    Subjective Data  Subjective: My knee doesn't like to bend much. Patient Stated Goal: return to living at daughter's home   Prior Functioning  Home Living Lives With: Daughter Available Help at Discharge: Family Type of Home: House Home Access: Stairs to enter Secretary/administrator of Steps: 4 Entrance Stairs-Rails: Left Home Layout: One level Bathroom Shower/Tub: Health visitor: Handicapped height Home Adaptive Equipment: Built-in shower seat;Walker - rolling Prior Function Level of Independence: Independent Able to Take Stairs?: Yes Driving: Yes Vocation: Retired Musician: No difficulties    Cognition  Overall Cognitive Status: Appears within functional limits for tasks assessed/performed Arousal/Alertness: Awake/alert Orientation Level: Appears intact for tasks assessed Behavior During Session: [facility] for tasks performed    Extremity/Trunk Assessment Right Upper Extremity Assessment RUE ROM/Strength/Tone: [facility] for tasks assessed Left Upper Extremity Assessment LUE ROM/Strength/Tone: WFL for tasks assessed Right Lower Extremity Assessment RLE ROM/Strength/Tone: Deficits RLE ROM/Strength/Tone Deficits: right knee flexion approx 40 degrees AAROM; SLR 3/5 strength RLE Sensation: WFL - Light Touch RLE Coordination: WFL - gross/fine motor Left Lower Extremity Assessment LLE ROM/Strength/Tone: Within functional levels LLE Sensation: WFL - Light Touch LLE  Coordination: WFL - gross/fine motor Trunk Assessment Trunk Assessment: Normal   Balance    End of Session PT - End of Session Equipment Utilized During Treatment: Gait belt Activity Tolerance: Patient  tolerated treatment well Patient left: in chair Nurse Communication: Mobility status   Tamala Ser 11/07/2011, 10:48 AM

## 2011-11-07 NOTE — Progress Notes (Signed)
ANTICOAGULATION CONSULT NOTE - Follow Up Consult  Pharmacy Consult for Warfarin Indication: atrial fibrillation  No Known Allergies  Patient Measurements: Height: 5\' 8"  (172.7 cm) Weight: 186 lb 8.2 oz (84.6 kg) (bed scale, pt unable to stand) IBW/kg (Calculated) : 68.4   Vital Signs: Temp: 98.2 F (36.8 C) (04/20 0454) Temp src: Oral (04/20 0454) BP: 156/74 mmHg (04/20 0454) Pulse Rate: 62  (04/20 0454)  Labs:  Basename 11/07/11 0449 11/06/11 1145 11/05/11  HGB 8.1* 9.6* --  HCT 25.4* 29.5* --  PLT 267 332 --  APTT -- -- --  LABPROT 28.4* 27.5* --  INR 2.62* 2.51* 2.9  HEPARINUNFRC -- -- --  CREATININE 1.06 0.93 --  CKTOTAL -- 81 --  CKMB -- -- --  TROPONINI -- -- --   Estimated Creatinine Clearance: 52 ml/min (by C-G formula based on Cr of 1.06).   Assessment:  78 yo M admitted 4/19 with nausea, vomiting, and diarrhea.   Pt is s/p R TKA on 10/26/11. On chronic warfarin for hx of Afib.   Home warfarin dose reported as 3mg  daily except 1.5mg  on Thursdays.   INR is therapeutic but had increased despite missed dose on 4/18.  Will reduce dose slightly today.  CBC ok. No bleeding/complications reported.  Goal of Therapy:  INR 2-3   Plan:  Warfarin 2.5 mg once today. Follow up INR in AM.  Clance Boll 11/07/2011,9:33 AM

## 2011-11-08 DIAGNOSIS — R112 Nausea with vomiting, unspecified: Secondary | ICD-10-CM

## 2011-11-08 DIAGNOSIS — D649 Anemia, unspecified: Secondary | ICD-10-CM

## 2011-11-08 DIAGNOSIS — R197 Diarrhea, unspecified: Secondary | ICD-10-CM

## 2011-11-08 LAB — CBC
HCT: 26 % — ABNORMAL LOW (ref 39.0–52.0)
MCH: 30.1 pg (ref 26.0–34.0)
MCHC: 31.9 g/dL (ref 30.0–36.0)
MCV: 94.2 fL (ref 78.0–100.0)
RDW: 16.9 % — ABNORMAL HIGH (ref 11.5–15.5)

## 2011-11-08 LAB — HEPATITIS PANEL, ACUTE: Hep A IgM: NEGATIVE

## 2011-11-08 NOTE — Progress Notes (Signed)
Physical Therapy Treatment Patient Details Name: Ricardo Perkins MRN: [id number] DOB: Aug 01, 1922 Today's Date: 11/08/2011 Time: 4098-1191 PT Time Calculation (min): 28 min  PT Assessment / Plan / Recommendation Comments on Treatment Session  reinforced importance of working on knee flexion as ROM is limited, reviewed exercises, daughter present and stated she'd assist pt with exercises again later today    Follow Up Recommendations  Home health PT    Equipment Recommendations  None recommended by PT    Frequency Min 6X/week   Plan Discharge plan remains appropriate    Precautions / Restrictions Precautions Precautions: Knee Restrictions RLE Weight Bearing: Weight bearing as tolerated   Pertinent Vitals/Pain *no c/o pain at rest; ice applied to R knee after PT session**    Mobility  Transfers Transfers: Sit to Stand;Stand to Sit Sit to Stand: 6: Modified independent (Device/Increase time);From chair/3-in-1 Stand to Sit: 6: Modified independent (Device/Increase time);To chair/3-in-1 Ambulation/Gait Ambulation/Gait Assistance: 6: Modified independent (Device/Increase time) Assistive device: Rolling walker Ambulation/Gait Assistance Details: Pt ambulated with 4 liters of O2 via nasal canula; VCs for breathing technique Gait Pattern: Within Functional Limits Stairs: No    Exercises Total Joint Exercises Ankle Circles/Pumps: AROM;Both;15 reps Quad Sets: AROM;Both;10 reps Short Arc Quad: AROM;Right;Other reps (comment);Other (comment);Supine (15) Heel Slides: AAROM;Right;15 reps;Supine Straight Leg Raises: AAROM;Right;15 reps;Supine Long Arc Quad: AAROM;Right;15 reps;Seated Knee Flexion: AAROM;Right;15 reps;Seated   PT Goals Acute Rehab PT Goals Pt will Ambulate: >150 feet;with modified independence PT Goal: Ambulate - Progress: Met Pt will Perform Home Exercise Program: Independently PT Goal: Perform Home Exercise Program - Progress: Progressing toward goal  Visit  Information  Last PT Received On: 11/08/11 Assistance Needed: +1    Subjective Data  Subjective: Pt reports he's feeling well. Patient Stated Goal: to get his knee bending   Cognition  Overall Cognitive Status: Appears within functional limits for tasks assessed/performed Arousal/Alertness: Awake/alert Orientation Level: Appears intact for tasks assessed    Balance     End of Session PT - End of Session Activity Tolerance: Patient tolerated treatment well Patient left: in chair;with call bell/phone within reach;with family/visitor present Nurse Communication: Mobility status    [name] 11/08/2011, 1:00 PM

## 2011-11-08 NOTE — Progress Notes (Signed)
Subjective: Patient reports pain as mild.   Patient seen in rounds with Dr. Lequita Halt. Patient is feeling better and looks more comfortable today.  He is hoping to go home.  Objective: Vital signs in last 24 hours: Temp:  [98 F (36.7 C)-98.2 F (36.8 C)] 98.2 F (36.8 C) (04/21 4540) Pulse Rate:  [66-77] 66  (04/21 0637) Resp:  [19-20] 19  (04/21 0637) BP: (129-165)/(63-75) 165/75 mmHg (04/21 0637) SpO2:  [95 %-99 %] 95 % (04/21 1029) Weight:  [83.8 kg (184 lb 11.9 oz)] 83.8 kg (184 lb 11.9 oz) (04/21 0637)  Intake/Output from previous day:  Intake/Output Summary (Last 24 hours) at 11/08/11 1036 Last data filed at 11/08/11 0715  Gross per 24 hour  Intake    360 ml  Output   1155 ml  Net   -795 ml    Intake/Output this shift: Total I/O In: -  Out: 125 [Urine:125]  Labs:  [facility] 11/08/11 0500 11/07/11 0449 11/06/11 1145  HGB 8.3* 8.1* 9.6*    Basename 11/08/11 0500 11/07/11 0449  WBC 5.4 4.0  RBC 2.76* 2.72*  HCT 26.0* 25.4*  PLT 263 267    Basename 11/07/11 0449 11/06/11 1145  NA 139 135  K 3.5 3.5  CL 108 101  CO2 23 24  BUN 15 18  CREATININE 1.06 0.93  GLUCOSE 108* 142*  CALCIUM 8.0* 8.7    Basename 11/08/11 0500 11/07/11 0449  LABPT -- --  INR 3.12* 2.62*    Exam - Neurovascular intact Sensation intact distally Dressing/Incision - clean, dry, no drainage Motor function intact - moving foot and toes well on exam.  Past Medical History  Diagnosis Date  . INSOMNIA, PERSISTENT 08/22/2008  . DECREASED HEARING 10/26/2007  . HYPERTENSION 07/23/2008  . BRADYCARDIA 04/01/2009  . C O P D 10/26/2007  . RENAL INSUFFICIENCY 04/01/2009  . BENIGN PROSTATIC HYPERTROPHY, WITH OBSTRUCTION 11/21/2007  . ASTEATOTIC ECZEMA 03/29/2008  . INSOMNIA UNSPECIFIED 11/21/2007  . LEG EDEMA, BILATERAL 01/07/2010  . PAROXYSMAL ATRIAL FIBRILLATION 11/21/2007  . Arthritis     knees, shoulders   . Chronic respiratory failure     Assessment/Plan:     Active Problems:   HYPERTENSION  C O P D  PAROXYSMAL ATRIAL FIBRILLATION  Jaundice  Nausea, vomiting and diarrhea  Fall at home  Anemia  Altered mental state   DVT Prophylaxis - Coumadin Weight-Bearing as tolerated to right leg Right Total Knee If he is discharged, then follow up at his scheduled follow up visit.  Ricardo Perkins 11/08/2011, 10:36 AM

## 2011-11-08 NOTE — Progress Notes (Signed)
Cm spoke with [employer] rep Lupita Leash concerning pt's Hh agency. Per pt and daughter, Pt currently active with [facility] for HHPT. Awaiting resumtpion of care orders if needed upon discharge.   Leonie Green [phone]

## 2011-11-08 NOTE — Progress Notes (Signed)
Subjective: [name] is 12 days postop for TKR right. He was admitted April 19th for jaundice, nausea and vomiting. His course has been marked by a drop of Hgb 9.6 to 8.3 g. He has had no signs of bleeding. He reports that he has had two loose stools w/o blood or black color. He ate well yesterday, but has no appetite today. He is feeling better and wishes to go home.  Objective: Lab: Lab Results  Component Value Date   WBC 5.4 11/08/2011   HGB 8.3* 11/08/2011   HCT 26.0* 11/08/2011   MCV 94.2 11/08/2011   PLT 263 11/08/2011   LFTs 11/06/11 -  AST 68, ALT 141, Alk Phos 122, Totasl bili 1.6 INR 3.12   Imaging: U/S abdomen - normal except for GB sludge  Physical Exam: Filed Vitals:   11/08/11 0637  BP: 165/75  Pulse: 66  Temp: 98.2 F (36.8 C)  Resp: 21  elderly white man in no distress, sitting in Geri-chair Cor - RRR Pulm - normal respirations, rales at left base otherwise clear Abd - BS+ , soft, no guarding or rebound Ext - right thigh swollen, right knee with dry dressing, swollen/minor effusion, right calf swollen.     Assessment/Plan: 1. GI - patient with initial N/V and jaundice with mildly elevated LFTs. No abdominal pain, no abnormal findings on PE. Plan - will repeat LFTs in AM  2. Ortho - s/p TKR - ortho has been following - no active problems: recovery as expected. Plan - continue in-pt PT            Will resume HH-PT at discharge.  3. Anemia - mild persistent drop in Hgb w/o obvious source of loss. Asymptomatic. Plan - f/u Hgb in AM  4. COPD - no increased WOB with anemia. O2 sats Ricardo Perkins 11/08/2011, 11:19 AM

## 2011-11-08 NOTE — Progress Notes (Signed)
ANTICOAGULATION CONSULT NOTE - Follow Up Consult  Pharmacy Consult for Warfarin Indication: atrial fibrillation  No Known Allergies  Patient Measurements: Height: 5\' 8"  (172.7 cm) Weight: 184 lb 11.9 oz (83.8 kg) IBW/kg (Calculated) : 68.4   Vital Signs: Temp: 98.2 F (36.8 C) (04/21 0637) Temp src: Oral (04/21 0637) BP: 165/75 mmHg (04/21 0637) Pulse Rate: 66  (04/21 0637)  Labs:  Basename 11/08/11 0500 11/07/11 0449 11/06/11 1145  HGB 8.3* 8.1* --  HCT 26.0* 25.4* 29.5*  PLT 263 267 332  APTT -- -- --  LABPROT 32.6* 28.4* 27.5*  INR 3.12* 2.62* 2.51*  HEPARINUNFRC -- -- --  CREATININE -- 1.06 0.93  CKTOTAL -- -- 81  CKMB -- -- --  TROPONINI -- -- --   Estimated Creatinine Clearance: 51.8 ml/min (by C-G formula based on Cr of 1.06).   Assessment:  78 yo M admitted 4/19 with nausea, vomiting, and diarrhea.   Pt is s/p R TKA on 10/26/11. On chronic warfarin for hx of Afib.   Home warfarin dose reported as 3mg  daily except 1.5mg  on Thursdays.   INR supratherapeutic and had been increasing despite missed dose on 4/18.  Will hold dose today.  CBC ok. No bleeding/complications reported.  Goal of Therapy:  INR 2-3   Plan:  No warfarin dose today. Follow up INR in AM.  Clance Boll 11/08/2011,8:29 AM

## 2011-11-09 ENCOUNTER — Telehealth: Payer: Self-pay

## 2011-11-09 DIAGNOSIS — R112 Nausea with vomiting, unspecified: Secondary | ICD-10-CM | POA: Diagnosis not present

## 2011-11-09 DIAGNOSIS — R197 Diarrhea, unspecified: Secondary | ICD-10-CM | POA: Diagnosis not present

## 2011-11-09 DIAGNOSIS — D649 Anemia, unspecified: Secondary | ICD-10-CM | POA: Diagnosis not present

## 2011-11-09 LAB — BASIC METABOLIC PANEL
GFR calc Af Amer: 68 mL/min — ABNORMAL LOW (ref >90)
GFR calc non Af Amer: 58 mL/min — ABNORMAL LOW (ref >90)
Potassium: 3.6 mEq/L (ref 3.5–5.1)
Sodium: 140 mEq/L (ref 135–145)

## 2011-11-09 LAB — HEPATIC FUNCTION PANEL
ALT: 90 U/L — ABNORMAL HIGH (ref 0–53)
Alkaline Phosphatase: 106 U/L (ref 39–117)
Bilirubin, Direct: 0.4 mg/dL — ABNORMAL HIGH (ref 0.0–0.3)
Indirect Bilirubin: 1.1 mg/dL — ABNORMAL HIGH (ref 0.3–0.9)

## 2011-11-09 MED ORDER — ALBUTEROL SULFATE (5 MG/ML) 0.5% IN NEBU: 2.5000 mg | INHALATION_SOLUTION | RESPIRATORY_TRACT | Status: DC | PRN

## 2011-11-09 MED ORDER — IPRATROPIUM BROMIDE 0.02 % IN SOLN: 0.5000 mg | RESPIRATORY_TRACT | Status: DC | PRN

## 2011-11-09 NOTE — Telephone Encounter (Signed)
Nurse w/ [facility] called to advise MD that a u/a and culture were collected prior to pt hospital admission which should some growth. She will fax over to a side clinical fax for MD review.  Fax given to MD and phone note closed/ MD aware pt in hospital.

## 2011-11-09 NOTE — Progress Notes (Addendum)
ANTICOAGULATION CONSULT NOTE - Follow Up Consult  Pharmacy Consult for Warfarin Indication: atrial fibrillation  No Known Allergies  Patient Measurements: Height: 5\' 8"  (172.7 cm) Weight: 184 lb 11.9 oz (83.8 kg) IBW/kg (Calculated) : 68.4   Vital Signs: Temp: 97.8 F (36.6 C) (04/22 0730) Temp src: Oral (04/22 0730) BP: 164/75 mmHg (04/22 0730) Pulse Rate: 96  (04/22 0730)  Labs:  Basename 11/09/11 0415 11/08/11 0500 11/07/11 0449 11/06/11 1145  HGB 8.7* 8.3* -- --  HCT 27.4* 26.0* 25.4* --  PLT -- 263 267 332  APTT -- -- -- --  LABPROT 31.6* 32.6* 28.4* --  INR 3.00* 3.12* 2.62* --  HEPARINUNFRC -- -- -- --  CREATININE 1.10 -- 1.06 0.93  CKTOTAL -- -- -- 81  CKMB -- -- -- --  TROPONINI -- -- -- --   Estimated Creatinine Clearance: 49.9 ml/min (by C-G formula based on Cr of 1.1).   Assessment:  78 yo M admitted 4/19 with nausea, vomiting, and diarrhea.   Pt is s/p R TKA on 10/26/11. On chronic warfarin for hx of Afib.   Home warfarin dose reported as 3mg  daily except 1.5mg  on Thursdays.   INR decreased to upper end of therapeutic range today.  (warfarin held 4/21)  CBC stable, low. No bleeding/complications reported.  Goal of Therapy:  INR 2-3   Plan:   Continue to hold warfarin one more day.  Follow up INR in AM.   Lynann Beaver PharmD, BCPS Pager [phone] 11/09/2011 8:27 AM

## 2011-11-09 NOTE — Progress Notes (Signed)
PT Cancellation Note  ___Treatment cancelled today due to medical issues with patient which prohibited therapy  ___ Treatment cancelled today due to patient receiving procedure or test   ___ Treatment cancelled today due to patient's refusal to participate   _X_ Treatment cancelled today due to "I'm getting ready to leave right now", pt D/C to home.    Felecia Shelling  PTA [facility]  Acute  Rehab Pager     [phone]

## 2011-11-09 NOTE — Progress Notes (Signed)
Subjective: Patient will be two weeks out from total knee this Wed. Patient reports pain as minimal..   Patient seen in rounds with Dr. Lequita Halt. Patient is well, and has had no acute complaints or problems Plan is to go Home after hospital stay.  Objective: Vital signs in last 24 hours: Temp:  [97.8 F (36.6 C)-98.2 F (36.8 C)] 97.8 F (36.6 C) (04/22 0730) Pulse Rate:  [61-96] 96  (04/22 0730) Resp:  [18] 18  (04/22 0730) BP: (150-164)/(75-80) 164/75 mmHg (04/22 0730) SpO2:  [92 %-97 %] 97 % (04/22 0730)  Intake/Output from previous day:  Intake/Output Summary (Last 24 hours) at 11/09/11 0750 Last data filed at 11/09/11 0600  Gross per 24 hour  Intake    480 ml  Output    375 ml  Net    105 ml    Intake/Output this shift:    Labs:  Basename 11/09/11 0415 11/08/11 0500 11/07/11 0449 11/06/11 1145  HGB 8.7* 8.3* 8.1* 9.6*    Basename 11/09/11 0415 11/08/11 0500 11/07/11 0449  WBC -- 5.4 4.0  RBC -- 2.76* 2.72*  HCT 27.4* 26.0* --  PLT -- 263 267    Basename 11/09/11 0415 11/07/11 0449  NA 140 139  K 3.6 3.5  CL 106 108  CO2 26 23  BUN 19 15  CREATININE 1.10 1.06  GLUCOSE 113* 108*  CALCIUM 8.4 8.0*    Basename 11/09/11 0415 11/08/11 0500  LABPT -- --  INR 3.00* 3.12*    EXAM General - Patient is Alert and Appropriate Extremity - Neurovascular intact Sensation intact distally Dressing/Incision - clean, dry, no drainage, healing Motor Function - intact, moving foot and toes well on exam.   Past Medical History  Diagnosis Date  . INSOMNIA, PERSISTENT 08/22/2008  . DECREASED HEARING 10/26/2007  . HYPERTENSION 07/23/2008  . BRADYCARDIA 04/01/2009  . C O P D 10/26/2007  . RENAL INSUFFICIENCY 04/01/2009  . BENIGN PROSTATIC HYPERTROPHY, WITH OBSTRUCTION 11/21/2007  . ASTEATOTIC ECZEMA 03/29/2008  . INSOMNIA UNSPECIFIED 11/21/2007  . LEG EDEMA, BILATERAL 01/07/2010  . PAROXYSMAL ATRIAL FIBRILLATION 11/21/2007  . Arthritis     knees, shoulders   . Chronic  respiratory failure     Assessment/Plan:     Active Problems:  HYPERTENSION  C O P D  PAROXYSMAL ATRIAL FIBRILLATION  Jaundice  Nausea, vomiting and diarrhea  Fall at home  Anemia  Altered mental state   Discharge home with home health - already has [place] working with him at home.  Resume home health.  DVT Prophylaxis - Coumadin Weight-Bearing as tolerated to right leg Right Total Knee  If he is discharged, then follow up at his scheduled follow up visit next week.  Gilverto Dileonardo 11/09/2011, 7:50 AM

## 2011-11-10 ENCOUNTER — Encounter (HOSPITAL_COMMUNITY): Payer: Self-pay | Admitting: Orthopedic Surgery

## 2011-11-10 NOTE — Discharge Summary (Signed)
Ricardo Perkins, Ricardo Perkins NO.:  [id number]  MEDICAL RECORD NO.:  [id number]  LOCATION:  1425                         FACILITY:  [facility]  PHYSICIAN:  Rosalyn Gess. Shantana Christon, MD  DATE OF BIRTH:  [birth year]  DATE OF ADMISSION:  11/06/2011 DATE OF DISCHARGE:  11/09/2011                              DISCHARGE SUMMARY   ADMITTING DIAGNOSES: 1. Elevated transaminases. 2. Nausea and vomiting. 3. Status post TKR. 4. History of COPD. 5. PAF, on anticoagulation therapy.  DISCHARGE DIAGNOSES: 1. Elevated transaminases. 2. Nausea and vomiting. 3. Status post TKR. 4. History of COPD. 5. PAF, on anticoagulation therapy.  CONSULTANTS:  Dr. Lequita Halt for orthopedics.  PROCEDURES:  Ultrasound of the abdomen complete, performed on the 19th, which revealed gallbladder sludge without other evidence of acute cholecystitis; bilateral renal parenchymal thinning; no definite abdominal aortic aneurysm; normal kidney size.  HISTORY OF PRESENT ILLNESS:  [name] is an 78 year old Caucasian gentleman who underwent total knee replacement on April 8 and was discharged home on April 13.  The patient had been home for 2 days when he developed a slight jaundice and dark-colored urine.  He was seen in the office for possible UTI, which was unremarkable.  The patient was discharged from the office to home, but returned because of nausea, vomiting and persistent jaundice.  He was referred to the emergency department for admission.  The patient had been started on oral Keflex for possible UTI.  The patient, in addition to nausea and vomiting had 3 episodes of watery brown diarrhea with no blood or mucus.  The patient had increasing weakness and EMS was summoned to bring the patient to the hospital from the office.  The patient did have ongoing pain rated mild to moderate of the right knee, which was expected.  He had discoloration of the entire right lower extremity from groin to mid leg but  no evidence of lateral compartment syndrome or other significant abnormality.  The patient's daughter reported the leg was less swollen than it had been prior to admission.  Orthopedics had seen the patient, felt his leg was stable.  Because of the patient's elevated transaminases, jaundice, weakness, nausea and vomiting he was admitted to the hospital.  Please see the H and P for past medical history, family history, social history, and admission examination.  HOSPITAL COURSE: 1. Nausea and vomiting.  The patient's laboratory evaluation was     unremarkable.  Stool was negative for C. diff.  Laboratory was     unremarkable with no sign of leukocytosis to suggest infection.  It     was felt the patient had viral gastroenteritis.  He was hydrated     with IV fluids.  The patient was able to take on a diet.  He had no     recurrent nausea or vomiting during the last 48 hours.  He did have     a slight decline in appetite on the 21st, but did well on the     evening of the 21st and had a good breakfast on the day of     discharge.  He has had a couple of loose stools, but no significant  diarrhea.  Patient's GI symptoms seemed to be stabilized. 2. Elevated transaminases.  The patient had mildly elevated     transaminases on the day of admission with an AST of 64, ALT of     165, alkaline phosphatase of 170.  Total bilirubin was 2.1.  These     did trend down over the next 2 days, so by the day of discharge,     22nd, alkaline phosphatase was down to 106; AST was 23, in normal     range; ALT was 90, declined but still mildly elevated.  The     patient's total bilirubin declined from 2.1 to 1.5, although still     slightly elevated.  Direct bilirubin was 0.4, indirect bilirubin     was 1.1.  The patient's jaundice resolved.  He had no significant     abdominal pain or discomfort.  Ultrasound of the abdomen was     performed with results as above, with no significant abnormality in      regards to liver, with no evidence of focal lesions and a     homogeneous appearance.  With the patient's transaminases     improving, there is no need to keep the patient in the hospital and     he will be discharged to home.  He will be followed up in the     office with repeat liver function studies in 7-10 days.  If he does     not show continued improvement, we will need to consider GI     consultation, further evaluation for hepatitis. 3. Orthopedics.  The patient was seen during this hospitalization by     Dr. Lequita Halt and his PA.  The patient was thought to be doing well.     The swelling and discoloration of the right thigh was thought to be     within normal limits for a postoperative knee.  The patient has     been working with physical therapy at home and this will be resumed     at the time of discharge.  The patient was to see Dr. Lequita Halt in     followup on the 23rd, and he will call the office to see if this     appointment needs to be kept given that he has been seen while in     hospital. 4. COPD.  The patient has been stable with no exacerbation of his     COPD.  He does remain oxygen dependent. With the patient's nausea and vomiting resolved, with his transaminase elevation improving, with the patient being able to keep down food and fluids, at this point he is ready for discharge to home.  DISCHARGE EXAMINATION:  VITAL SIGNS:  Temperature was 97.8, blood pressure 164/75, pulse 96, respirations 18, oxygen saturation 97% on 2 L.  GENERAL APPEARANCE:  This is a pleasant elderly gentleman in bed who is in no acute distress.  HEENT:  Normocephalic and atraumatic, conjunctivae and sclerae clear without icterus.  Pupils equal, round, and reactive.  NECK:  Supple.  CHEST:  The patient is moving air well with no wheezing, no increased work of breathing.  He is on oxygen. CARDIOVASCULAR:  2+ radial pulses, precordium is quiet.  He had a regular rate and rhythm.  ABDOMEN:  Soft  with positive bowel sounds.  No guarding or rebound.  There was no organo/splenomegaly appreciated. EXTREMITIES:  The patient's right knee is mildly swollen.  There is a dry bandage  over his surgical wound.  His right thigh is mildly swollen, but evidently improved.  NEUROLOGIC:  Patient is awake, alert.  He is oriented to person, place, time, and context.  His speech is clear.  He moves all extremities to command.  FINAL LABORATORY:  From the day of discharge, sodium 140, potassium 3.6 chloride 106, CO2 of 26, BUN 19, creatinine 1.1.  Glucose was 113, alkaline phosphatase 106, albumin 2.8.  AST was 23, ALT was 90, total protein 5.3, direct bilirubin 0.4, indirect bilirubin 1.1, total bilirubin 1.5.  DISPOSITION:  The patient is to be discharged home.  He does need to continue with home health physical therapy for rehabilitation after knee surgery.  He is homebound due to his COPD and oxygen dependence.  Overall condition is stable and improved.  The patient will be seen in followup as noted.     Rosalyn Gess Dayleen Beske, MD     MEN/MEDQ  D:  11/09/2011  T:  11/10/2011  Job:  [id number]

## 2011-11-11 ENCOUNTER — Ambulatory Visit: Payer: Self-pay | Admitting: *Deleted

## 2011-11-11 ENCOUNTER — Telehealth: Payer: Self-pay | Admitting: Internal Medicine

## 2011-11-11 DIAGNOSIS — I4891 Unspecified atrial fibrillation: Secondary | ICD-10-CM

## 2011-11-11 DIAGNOSIS — J449 Chronic obstructive pulmonary disease, unspecified: Secondary | ICD-10-CM

## 2011-11-11 NOTE — Telephone Encounter (Signed)
lmomtcb x1 for Ricardo Perkins Last OV 08/2009

## 2011-11-12 NOTE — Telephone Encounter (Signed)
Called, spoke with pt's daughter, Ricardo Perkins, who states pt had a knee replacement on April 8.  He has a portable o2 system but states it is too heavy for him right now.  Per Ricardo Perkins, his current portable system weighs 9.5 lbs.  She is requesting an order for a lightweight portable system for pt which [facility] has.  States [facility] will need an order to evaluate pt for lightweight portable o2.  Pt was last seen 08/2009 and has no pending appts with MR.  He does have a pending OV with Dr. Arthur Holms on Monday.  I advised Ricardo Perkins of [place] [employer] that providers need to see pts at least once yearly and date of pt's last OV with MR.  Advised we will need to schedule OV with MR, but Ricardo Perkins states this appt will have to be done later as pt's mobility is difficult at this time.  She states pt really needs this lightweight portable o2 system and did NOT want to wait until this evening to hear back when MR is in the office bc she wants this set up prior to pt's appt with Dr. Arthur Holms on Monday.  Ricardo Perkins states MR can see where pt is seeing other [place] Drs and requesting this been taken care of ASAP.  Will send msg to MR's box and page him.  MR, pls advise.  Thank you.

## 2011-11-12 NOTE — Telephone Encounter (Signed)
Order was sent to [facility] Spoke with daughter and notified that this was done Notified her to have PCP's office ambulate the pt on RA per MR request. She verbalized understanding of this.  Ov with MR scheduled for 12-31-11.

## 2011-11-12 NOTE — Telephone Encounter (Signed)
pk please set up portable o2 system. Make sure [facility] is ok with it and patient is not surprised with bill. Also, when he sees [facility] Monday please tell their office to test pulse ox on RA and walk him as much as possible on RA and test pulse ox  But patient needs to see me wihtin 2 months

## 2011-11-16 ENCOUNTER — Other Ambulatory Visit (INDEPENDENT_AMBULATORY_CARE_PROVIDER_SITE_OTHER): Payer: Medicare Other

## 2011-11-16 ENCOUNTER — Ambulatory Visit: Payer: Medicare Other | Admitting: Internal Medicine

## 2011-11-16 ENCOUNTER — Ambulatory Visit (INDEPENDENT_AMBULATORY_CARE_PROVIDER_SITE_OTHER): Payer: Medicare Other | Admitting: Internal Medicine

## 2011-11-16 ENCOUNTER — Encounter: Payer: Self-pay | Admitting: Internal Medicine

## 2011-11-16 VITALS — BP 130/70 | HR 61 | Temp 97.6°F | Resp 16 | Wt 188.0 lb

## 2011-11-16 DIAGNOSIS — D649 Anemia, unspecified: Secondary | ICD-10-CM

## 2011-11-16 DIAGNOSIS — R17 Unspecified jaundice: Secondary | ICD-10-CM

## 2011-11-16 DIAGNOSIS — J449 Chronic obstructive pulmonary disease, unspecified: Secondary | ICD-10-CM

## 2011-11-16 DIAGNOSIS — I10 Essential (primary) hypertension: Secondary | ICD-10-CM | POA: Diagnosis not present

## 2011-11-16 DIAGNOSIS — M1710 Unilateral primary osteoarthritis, unspecified knee: Secondary | ICD-10-CM

## 2011-11-16 LAB — HEPATIC FUNCTION PANEL
Bilirubin, Direct: 0.4 mg/dL — ABNORMAL HIGH (ref 0.0–0.3)
Total Bilirubin: 1.7 mg/dL — ABNORMAL HIGH (ref 0.3–1.2)

## 2011-11-16 LAB — CBC WITH DIFFERENTIAL/PLATELET
Basophils Relative: 0.9 % (ref 0.0–3.0)
Eosinophils Absolute: 0.1 10*3/uL (ref 0.0–0.7)
Hemoglobin: 11.2 g/dL — ABNORMAL LOW (ref 13.0–17.0)
Lymphocytes Relative: 18.2 % (ref 12.0–46.0)
MCHC: 32.7 g/dL (ref 30.0–36.0)
MCV: 93.8 fl (ref 78.0–100.0)
Neutro Abs: 3.8 10*3/uL (ref 1.4–7.7)
RBC: 3.66 Mil/uL — ABNORMAL LOW (ref 4.22–5.81)

## 2011-11-16 NOTE — Assessment & Plan Note (Signed)
Lab Results  Component Value Date   WBC 5.4 11/08/2011   HGB 8.7* 11/09/2011   HCT 27.4* 11/09/2011   MCV 94.2 11/08/2011   PLT 263 11/08/2011   Appears sallow - will recheck CBC. Recommendations to follow.

## 2011-11-16 NOTE — Assessment & Plan Note (Signed)
In hospital transaminases were improving.  Plan  F/u lab today

## 2011-11-16 NOTE — Assessment & Plan Note (Signed)
BP Readings from Last 3 Encounters:  11/16/11 130/70  11/09/11 164/75  11/06/11 142/70   Good control today.

## 2011-11-16 NOTE — Progress Notes (Signed)
  Subjective:    Patient ID: Ricardo Perkins, male    DOB: July 23, 1922, 78 y.o.   MRN: [id number]  HPI Ricardo Perkins presents for hospital follow-up - he was discharged April 23rd after post-operative jaundice with mild elevation of transaminases. Records reviewed. He did well with conservative therapy - at discharge AST 90, ALT normal, alk phos mildly elevated.  Since d/c he has seen Dr. Lequita Halt in follow-up (they allow saw him as in-patient) of right TKR. He still has some leg swelling and warmth to the joint. He is getting home PT and is working on his rehab.  Pulmonary - O2 dependent COPD. He is requesting to have change to liquid oxygen system: stnd portable tank is too heavy and limits his rehab.  PMH, FamHx and SocHx reviewed for any changes and relevance.    Review of Systems System review is negative for any constitutional, cardiac, pulmonary, GI or neuro symptoms or complaints other than as described in the HPI.     Objective:   Physical Exam Filed Vitals:   11/16/11 0937  BP: 130/70  Pulse: 61  Temp: 97.6 F (36.4 C)  Resp: 16   Gen'l- older man with sallow complexion but not jaundiced. Chest - good breath sounds, no wheezing Cor - RRR Ext - right knee - suture line looks good; there is minor swelling thigh and knee; the knee feels warm       Assessment & Plan:

## 2011-11-16 NOTE — Assessment & Plan Note (Signed)
Stable and doing well. 

## 2011-11-16 NOTE — Assessment & Plan Note (Signed)
Stable. Will order eval and use of liquid oxygen system - will facilitate rehab

## 2011-11-17 ENCOUNTER — Encounter: Payer: Self-pay | Admitting: Internal Medicine

## 2011-11-18 ENCOUNTER — Ambulatory Visit: Payer: Self-pay | Admitting: Internal Medicine

## 2011-11-18 DIAGNOSIS — I4891 Unspecified atrial fibrillation: Secondary | ICD-10-CM

## 2011-11-25 ENCOUNTER — Ambulatory Visit: Payer: Self-pay | Admitting: Cardiovascular Disease

## 2011-11-25 DIAGNOSIS — I4891 Unspecified atrial fibrillation: Secondary | ICD-10-CM

## 2011-11-30 ENCOUNTER — Ambulatory Visit: Payer: Medicare Other | Attending: Orthopedic Surgery | Admitting: Physical Therapy

## 2011-11-30 DIAGNOSIS — M25669 Stiffness of unspecified knee, not elsewhere classified: Secondary | ICD-10-CM | POA: Insufficient documentation

## 2011-11-30 DIAGNOSIS — R269 Unspecified abnormalities of gait and mobility: Secondary | ICD-10-CM | POA: Diagnosis not present

## 2011-11-30 DIAGNOSIS — M25569 Pain in unspecified knee: Secondary | ICD-10-CM | POA: Diagnosis not present

## 2011-11-30 DIAGNOSIS — R5381 Other malaise: Secondary | ICD-10-CM | POA: Insufficient documentation

## 2011-11-30 DIAGNOSIS — IMO0001 Reserved for inherently not codable concepts without codable children: Secondary | ICD-10-CM | POA: Insufficient documentation

## 2011-12-01 ENCOUNTER — Telehealth: Payer: Self-pay | Admitting: *Deleted

## 2011-12-01 NOTE — Telephone Encounter (Signed)
Received fax pt needing renewal on his temazepam 30 mg... 12/01/1322:15pm/LMB

## 2011-12-01 NOTE — Telephone Encounter (Signed)
k for renewal x 5

## 2011-12-02 ENCOUNTER — Ambulatory Visit: Payer: Medicare Other

## 2011-12-02 DIAGNOSIS — M25569 Pain in unspecified knee: Secondary | ICD-10-CM | POA: Diagnosis not present

## 2011-12-02 DIAGNOSIS — IMO0001 Reserved for inherently not codable concepts without codable children: Secondary | ICD-10-CM | POA: Diagnosis not present

## 2011-12-02 DIAGNOSIS — R5381 Other malaise: Secondary | ICD-10-CM | POA: Diagnosis not present

## 2011-12-02 DIAGNOSIS — M25669 Stiffness of unspecified knee, not elsewhere classified: Secondary | ICD-10-CM | POA: Diagnosis not present

## 2011-12-02 DIAGNOSIS — R269 Unspecified abnormalities of gait and mobility: Secondary | ICD-10-CM | POA: Diagnosis not present

## 2011-12-02 MED ORDER — TEMAZEPAM 30 MG PO CAPS: 30.0000 mg | ORAL_CAPSULE | Freq: Every evening | ORAL | Status: DC | PRN

## 2011-12-02 NOTE — Telephone Encounter (Signed)
[facility]ed [facility] spoke with kent. Updated EPIC... 12/03/11@9 :36am/LMB

## 2011-12-03 ENCOUNTER — Ambulatory Visit (INDEPENDENT_AMBULATORY_CARE_PROVIDER_SITE_OTHER): Payer: Medicare Other | Admitting: *Deleted

## 2011-12-03 DIAGNOSIS — M1710 Unilateral primary osteoarthritis, unspecified knee: Secondary | ICD-10-CM | POA: Diagnosis not present

## 2011-12-03 DIAGNOSIS — I4891 Unspecified atrial fibrillation: Secondary | ICD-10-CM

## 2011-12-03 LAB — POCT INR: INR: 2.1

## 2011-12-04 ENCOUNTER — Ambulatory Visit: Payer: Medicare Other | Admitting: Physical Therapy

## 2011-12-04 DIAGNOSIS — R269 Unspecified abnormalities of gait and mobility: Secondary | ICD-10-CM | POA: Diagnosis not present

## 2011-12-04 DIAGNOSIS — IMO0001 Reserved for inherently not codable concepts without codable children: Secondary | ICD-10-CM | POA: Diagnosis not present

## 2011-12-04 DIAGNOSIS — R5381 Other malaise: Secondary | ICD-10-CM | POA: Diagnosis not present

## 2011-12-04 DIAGNOSIS — M25669 Stiffness of unspecified knee, not elsewhere classified: Secondary | ICD-10-CM | POA: Diagnosis not present

## 2011-12-04 DIAGNOSIS — M25569 Pain in unspecified knee: Secondary | ICD-10-CM | POA: Diagnosis not present

## 2011-12-06 ENCOUNTER — Encounter (HOSPITAL_BASED_OUTPATIENT_CLINIC_OR_DEPARTMENT_OTHER): Payer: Self-pay | Admitting: *Deleted

## 2011-12-06 ENCOUNTER — Emergency Department (HOSPITAL_BASED_OUTPATIENT_CLINIC_OR_DEPARTMENT_OTHER): Payer: Medicare Other

## 2011-12-06 ENCOUNTER — Emergency Department (HOSPITAL_BASED_OUTPATIENT_CLINIC_OR_DEPARTMENT_OTHER)
Admission: EM | Admit: 2011-12-06 | Discharge: 2011-12-06 | Disposition: A | Discharge status: Home / Self Care | Payer: Medicare Other | Attending: Emergency Medicine | Admitting: Emergency Medicine

## 2011-12-06 DIAGNOSIS — Z7901 Long term (current) use of anticoagulants: Secondary | ICD-10-CM | POA: Diagnosis not present

## 2011-12-06 DIAGNOSIS — I4891 Unspecified atrial fibrillation: Secondary | ICD-10-CM | POA: Insufficient documentation

## 2011-12-06 DIAGNOSIS — Z79899 Other long term (current) drug therapy: Secondary | ICD-10-CM | POA: Diagnosis not present

## 2011-12-06 DIAGNOSIS — W1811XA Fall from or off toilet without subsequent striking against object, initial encounter: Secondary | ICD-10-CM | POA: Insufficient documentation

## 2011-12-06 DIAGNOSIS — J4489 Other specified chronic obstructive pulmonary disease: Secondary | ICD-10-CM | POA: Insufficient documentation

## 2011-12-06 DIAGNOSIS — R197 Diarrhea, unspecified: Secondary | ICD-10-CM | POA: Insufficient documentation

## 2011-12-06 DIAGNOSIS — H5500 Unspecified nystagmus: Secondary | ICD-10-CM | POA: Diagnosis not present

## 2011-12-06 DIAGNOSIS — R404 Transient alteration of awareness: Secondary | ICD-10-CM | POA: Diagnosis not present

## 2011-12-06 DIAGNOSIS — J449 Chronic obstructive pulmonary disease, unspecified: Secondary | ICD-10-CM | POA: Diagnosis not present

## 2011-12-06 DIAGNOSIS — R42 Dizziness and giddiness: Secondary | ICD-10-CM | POA: Insufficient documentation

## 2011-12-06 DIAGNOSIS — R110 Nausea: Secondary | ICD-10-CM | POA: Insufficient documentation

## 2011-12-06 DIAGNOSIS — R55 Syncope and collapse: Secondary | ICD-10-CM | POA: Insufficient documentation

## 2011-12-06 DIAGNOSIS — R4020 Unspecified coma: Secondary | ICD-10-CM

## 2011-12-06 DIAGNOSIS — I10 Essential (primary) hypertension: Secondary | ICD-10-CM | POA: Insufficient documentation

## 2011-12-06 DIAGNOSIS — S01309A Unspecified open wound of unspecified ear, initial encounter: Secondary | ICD-10-CM | POA: Insufficient documentation

## 2011-12-06 DIAGNOSIS — S060X9A Concussion with loss of consciousness of unspecified duration, initial encounter: Secondary | ICD-10-CM | POA: Diagnosis not present

## 2011-12-06 DIAGNOSIS — S0990XA Unspecified injury of head, initial encounter: Secondary | ICD-10-CM | POA: Diagnosis not present

## 2011-12-06 LAB — DIFFERENTIAL
Lymphs Abs: 1.1 10*3/uL (ref 0.7–4.0)
Monocytes Relative: 10 % (ref 3–12)
Neutro Abs: 3.2 10*3/uL (ref 1.7–7.7)
Neutrophils Relative %: 66 % (ref 43–77)

## 2011-12-06 LAB — COMPREHENSIVE METABOLIC PANEL
Alkaline Phosphatase: 79 U/L (ref 39–117)
BUN: 19 mg/dL (ref 6–23)
Chloride: 104 mEq/L (ref 96–112)
GFR calc Af Amer: 61 mL/min — ABNORMAL LOW (ref >90)
GFR calc non Af Amer: 53 mL/min — ABNORMAL LOW (ref >90)
Glucose, Bld: 96 mg/dL (ref 70–99)
Potassium: 4 mEq/L (ref 3.5–5.1)
Total Bilirubin: 0.7 mg/dL (ref 0.3–1.2)

## 2011-12-06 LAB — URINALYSIS, ROUTINE W REFLEX MICROSCOPIC
Ketones, ur: NEGATIVE mg/dL
Leukocytes, UA: NEGATIVE
Nitrite: NEGATIVE
Specific Gravity, Urine: 1.019 (ref 1.005–1.030)
pH: 5.5 (ref 5.0–8.0)

## 2011-12-06 LAB — CARDIAC PANEL(CRET KIN+CKTOT+MB+TROPI): Total CK: 36 U/L (ref 7–232)

## 2011-12-06 LAB — CBC
HCT: 39 % (ref 39.0–52.0)
Hemoglobin: 13.1 g/dL (ref 13.0–17.0)
MCH: 30.7 pg (ref 26.0–34.0)
RBC: 4.27 MIL/uL (ref 4.22–5.81)

## 2011-12-06 LAB — APTT: aPTT: 46 seconds — ABNORMAL HIGH (ref 24–37)

## 2011-12-06 MED ORDER — MECLIZINE HCL 12.5 MG PO TABS: 25.0000 mg | ORAL_TABLET | Freq: Three times a day (TID) | ORAL | Status: AC | PRN

## 2011-12-06 MED ORDER — IOHEXOL 350 MG/ML SOLN
80.0000 mL | Freq: Once | INTRAVENOUS | Status: AC | PRN
  Administered 2011-12-06: 80 mL via INTRAVENOUS

## 2011-12-06 MED ORDER — MECLIZINE HCL 25 MG PO TABS
25.0000 mg | ORAL_TABLET | Freq: Once | ORAL | Status: AC
  Administered 2011-12-06: 25 mg via ORAL
  Filled 2011-12-06: qty 1

## 2011-12-06 NOTE — ED Notes (Signed)
Pt states he got up to go to the bathroom at 0350 this a.m. And went to sit down on the toilet and passed out for "about 5 minutes". Hx diarrhea for 3-4 days. States having episodes of the room spinning. Hit right ear on something. Dried blood noted. Alert and oriented at present. On O2 3-1/2 liters currently.

## 2011-12-06 NOTE — ED Provider Notes (Addendum)
History  This chart was scribed for Loren Racer, MD by Cherlynn Perches. The patient was seen in room MH10/MH10. Patient's care was started at 1614.  CSN: [id number]  Arrival date & time 12/06/11  1614   First MD Initiated Contact with Patient 12/06/11 1644      Chief Complaint  Patient presents with  . Loss of Consciousness    (Consider location/radiation/quality/duration/timing/severity/associated sxs/prior treatment) HPI  Ricardo Perkins is a 78 y.o. male with a h/o HTN, COPD, and atrial fibrillation who presents to the Emergency Department complaining of one episode of sudden onset loss of consciousness about 12 hours ago with associated dizziness and nausea. Pt states that he took a temazepam at 11 PM yesterday to help him sleep. Pt reports that he woke up this morning around 4:00AM and tried to get up but "felt like the room was spinning." Pt laid back down in bed for about 15 minutes and got up again slowly. Pt reports that he felt fine for about 5 minutes and went to the bathroom. As he was sitting down on the toilet, he passed out again "for about 5 minutes" and hit the right side of his head on the wall. When he woke up he was still sitting on the toilet. Since that episode, pt states that he has felt dizzy and nauseated. Pt also reports that he has had diarrhea for about 4 days with 3 episodes per day described as "orange liquid with no blood." Pt denies tinnitus, fever, pain and swelling of extremities, weakness, neck pain, numbness, and vision changes. Pt had knee surgery a few weeks ago. Pt denies any recent illness Pt is a former smoker and lives alone.    Past Medical History  Diagnosis Date  . INSOMNIA, PERSISTENT 08/22/2008  . DECREASED HEARING 10/26/2007  . HYPERTENSION 07/23/2008  . BRADYCARDIA 04/01/2009  . C O P D 10/26/2007  . RENAL INSUFFICIENCY 04/01/2009  . BENIGN PROSTATIC HYPERTROPHY, WITH OBSTRUCTION 11/21/2007  . ASTEATOTIC ECZEMA 03/29/2008  . INSOMNIA UNSPECIFIED  11/21/2007  . LEG EDEMA, BILATERAL 01/07/2010  . PAROXYSMAL ATRIAL FIBRILLATION 11/21/2007  . Arthritis     knees, shoulders   . Chronic respiratory failure     Past Surgical History  Procedure Date  . Appendectomy     @ 23  . Tonsillectomy   . Hernia repair 01/2011    bilateral inguinal hernia   . Eye surgery     bilateral cataract surgery   . Other surgical history     left finger surgery   . Knee surgery right  . Total knee arthroplasty 10/26/2011    Procedure: TOTAL KNEE ARTHROPLASTY;  Surgeon: Loanne Drilling, MD;  Location: [facility];  Service: Orthopedics;  Laterality: Right;    Family History  Problem Relation Age of Onset  . Heart disease Father   . Heart attack Father   . Cancer Mother     colon   . Cirrhosis Sister     liver failure  . Heart attack Brother   . Diabetes Neg Hx     History  Substance Use Topics  . Smoking status: Former Smoker -- 7 years    Quit date: 07/21/2007  . Smokeless tobacco: Never Used   Comment: History of very heavy smoking.  Quite about 4 years ago.  . Alcohol Use: Yes     wine occasional       Review of Systems  Allergies  Review of patient's allergies indicates no known allergies.  Home Medications   Current Outpatient Rx  Name Route Sig Dispense Refill  . AMLODIPINE BESYLATE 5 MG PO TABS Oral Take 5 mg by mouth daily after breakfast.    . ATENOLOL 50 MG PO TABS Oral Take 25 mg by mouth daily.     Marland Kitchen FINASTERIDE 5 MG PO TABS Oral Take 5 mg by mouth daily.     Marland Kitchen OMEPRAZOLE 20 MG PO CPDR Oral Take 1 capsule (20 mg total) by mouth daily. 90 capsule 2  . TAMSULOSIN HCL 0.4 MG PO CAPS Oral Take 0.4 mg by mouth 2 (two) times daily.     Marland Kitchen TEMAZEPAM 30 MG PO CAPS Oral Take 1 capsule (30 mg total) by mouth at bedtime as needed for sleep. 30 capsule 5  . WARFARIN SODIUM 3 MG PO TABS Oral Take 3 mg by mouth daily.    Marland Kitchen MECLIZINE HCL 12.5 MG PO TABS Oral Take 2 tablets (25 mg total) by mouth 3 (three) times daily as needed. 30 tablet 0      Triage Vitals: BP 122/101  Pulse 90  Temp(Src) 97.6 F (36.4 C) (Oral)  Resp 20  Ht 5\' 7"  (1.702 m)  Wt 188 lb (85.276 kg)  BMI 29.44 kg/m2  SpO2 95%  Physical Exam  Nursing note and vitals reviewed. Constitutional: He is oriented to person, place, and time. He appears well-developed and well-nourished.  HENT:  Head: Normocephalic and atraumatic.  Eyes: Conjunctivae are normal. Pupils are equal, round, and reactive to light.       fatigable horizontal nystagmus   Neck: Normal range of motion. Neck supple.  Cardiovascular: Normal rate and regular rhythm.   Pulmonary/Chest: Effort normal. He has no wheezes.  Abdominal: Soft. There is no tenderness.  Musculoskeletal: Normal range of motion. He exhibits no edema.       Moderate swelling of right knee, no calf tenderness  Neurological: He is alert and oriented to person, place, and time.       Finger to nose bilaterally, cranial nerve 2-12 intact, motor 5/5,   Skin: Skin is warm and dry.       Laceration on right ear  Psychiatric: He has a normal mood and affect. His behavior is normal.    ED Course  Procedures (including critical care time)  DIAGNOSTIC STUDIES: Oxygen Saturation is 95% on O2, adequate by my interpretation.    COORDINATION OF CARE: 5:10PM - discussed treatment plan with pt and family and they agree. 8:20PM - pt feeling better after medication. Will walk pt around ED and if he still feels all right, will discharge. Pt agrees with plan.  Labs Reviewed  COMPREHENSIVE METABOLIC PANEL - Abnormal; Notable for the following:    GFR calc non Af Amer 53 (*)    GFR calc Af Amer 61 (*)    All other components within normal limits  D-DIMER, QUANTITATIVE - Abnormal; Notable for the following:    D-Dimer, Quant 2.25 (*)    All other components within normal limits  PROTIME-INR - Abnormal; Notable for the following:    Prothrombin Time 25.1 (*)    INR 2.23 (*)    All other components within normal limits  APTT -  Abnormal; Notable for the following:    aPTT 46 (*)    All other components within normal limits  CBC  DIFFERENTIAL  CARDIAC PANEL(CRET KIN+CKTOT+MB+TROPI)  URINALYSIS, ROUTINE W REFLEX MICROSCOPIC   Dg Chest 2 View  12/06/2011  *RADIOLOGY REPORT*  Clinical Data: Sudden onset  of loss of consciousness 12 hours ago. Dizziness and nausea.  CHEST - 2 VIEW  Comparison: Chest x-ray dated 12/13/2010  Findings: Heart size and pulmonary vascularity are normal.  There is tortuosity of the thoracic aorta.  No infiltrates or effusions. Slight chronic accentuation of the interstitial markings at the right lung base.  IMPRESSION: No acute abnormalities.  Original Report Authenticated By: Gwynn Burly, M.D.   Ct Head Wo Contrast  12/06/2011  *RADIOLOGY REPORT*  Clinical Data: Syncope this morning for 5 minutes.  The patient struck her head and has blood coming from the right ear.  CT HEAD WITHOUT CONTRAST  Technique:  Contiguous axial images were obtained from the base of the skull through the vertex without contrast.  Comparison: Brain MRI dated 12/10/2008 and a CT scan of the head dated 12/10/2008  Findings: There is no acute intracranial hemorrhage, infarction, or mass lesion.  Brain parenchyma is normal for age with minimal diffuse atrophy, unchanged.  No evidence of basilar skull fracture or opacification of the middle ear cavities or mastoid air cells or other significant osseous abnormality.  IMPRESSION: No significant abnormalities.  Original Report Authenticated By: Gwynn Burly, M.D.   Ct Angio Chest W/cm &/or Wo Cm  12/06/2011  *RADIOLOGY REPORT*  Clinical Data: Syncope, elevated D-dimer  CT ANGIOGRAPHY CHEST  Technique:  Multidetector CT imaging of the chest using the standard protocol during bolus administration of intravenous contrast. Multiplanar reconstructed images including MIPs were obtained and reviewed to evaluate the vascular anatomy.  Contrast: 80mL OMNIPAQUE IOHEXOL 350 MG/ML SOLN   Comparison: None.  Findings: There are no filling defects within the pulmonary arteries to suggest acute pulmonary embolism.  No acute findings of the aorta or great vessels.  No pericardial fluid.  The esophagus is normal.  Small paratracheal lymph nodes are present.  No axillary lymphadenopathy or supraclavicular adenopathy.  Review of the lung parenchyma demonstrates central lobular and paraseptal emphysema.  There is mild architectural distortion at the lung bases with early honeycombing.  No pleural fluid or pneumothorax.  Mild bronchiectasis.  Limited view of the upper abdomen is unremarkable.  Limited view of the skeleton is unremarkable.  IMPRESSION:  1.  No evidence of acute pulmonary embolism. 2.  Emphysematous change in the lungs. 3.  Small mediastinal lymph nodes are likely related to COPD.  Original Report Authenticated By: Genevive Bi, M.D.     1. Vertigo   2. LOC (loss of consciousness)    laceration of the R ear irrigated thoroughly. Superficial lac closed with dermabond. No complications   MDM  I personally performed the services described in this documentation, which was scribed in my presence. The recorded information has been reviewed and considered.  Pt is asymptomatic. Ambulatory in ED. Symptoms likely due to medication, orthostasis and chronic vertigo for which he takes meclizine at home.          Loren Racer, MD 12/06/11 2210    Date: 12/06/2011  Rate: 77  Rhythm: normal sinus rhythm  QRS Axis: normal  Intervals: PR prolonged  ST/T Wave abnormalities: normal  Conduction Disutrbances:incomplete RBBB  Narrative Interpretation:   Old EKG Reviewed: unchanged    Loren Racer, MD 12/06/11 2212

## 2011-12-06 NOTE — Discharge Instructions (Signed)
Vertigo Vertigo means you feel like you or your surroundings are moving when they are not. Vertigo can be dangerous if it occurs when you are at work, driving, or performing difficult activities.  CAUSES  Vertigo occurs when there is a conflict of signals sent to your brain from the visual and sensory systems in your body. There are many different causes of vertigo, including:  Infections, especially in the inner ear.   A bad reaction to a drug or misuse of alcohol and medicines.   Withdrawal from drugs or alcohol.   Rapidly changing positions, such as lying down or rolling over in bed.   A migraine headache.   Decreased blood flow to the brain.   Increased pressure in the brain from a head injury, infection, tumor, or bleeding.  SYMPTOMS  You may feel as though the world is spinning around or you are falling to the ground. Because your balance is upset, vertigo can cause nausea and vomiting. You may have involuntary eye movements (nystagmus). DIAGNOSIS  Vertigo is usually diagnosed by physical exam. If the cause of your vertigo is unknown, your caregiver may perform imaging tests, such as an MRI scan (magnetic resonance imaging). TREATMENT  Most cases of vertigo resolve on their own, without treatment. Depending on the cause, your caregiver may prescribe certain medicines. If your vertigo is related to body position issues, your caregiver may recommend movements or procedures to correct the problem. In rare cases, if your vertigo is caused by certain inner ear problems, you may need surgery. HOME CARE INSTRUCTIONS   Follow your caregiver's instructions.   Avoid driving.   Avoid operating heavy machinery.   Avoid performing any tasks that would be dangerous to you or others during a vertigo episode.   Tell your caregiver if you notice that certain medicines seem to be causing your vertigo. Some of the medicines used to treat vertigo episodes can actually make them worse in some  people.  SEEK IMMEDIATE MEDICAL CARE IF:   Your medicines do not relieve your vertigo or are making it worse.   You develop problems with talking, walking, weakness, or using your arms, hands, or legs.   You develop severe headaches.   Your nausea or vomiting continues or gets worse.   You develop visual changes.   A family member notices behavioral changes.   Your condition gets worse.  MAKE SURE YOU:  Understand these instructions.   Will watch your condition.   Will get help right away if you are not doing well or get worse.  Document Released: 04/15/2005 Document Revised: 06/25/2011 Document Reviewed: 01/22/2011 [employer] Patient Information 2012 [employer]. 

## 2011-12-08 ENCOUNTER — Other Ambulatory Visit: Payer: Self-pay | Admitting: Internal Medicine

## 2011-12-08 ENCOUNTER — Telehealth: Payer: Self-pay | Admitting: Internal Medicine

## 2011-12-08 ENCOUNTER — Encounter: Payer: Self-pay | Admitting: Nurse Practitioner

## 2011-12-08 ENCOUNTER — Ambulatory Visit (INDEPENDENT_AMBULATORY_CARE_PROVIDER_SITE_OTHER): Payer: Medicare Other | Admitting: Nurse Practitioner

## 2011-12-08 ENCOUNTER — Telehealth: Payer: Self-pay | Admitting: Cardiology

## 2011-12-08 VITALS — BP 108/70 | HR 60 | Ht 69.0 in | Wt 163.0 lb

## 2011-12-08 DIAGNOSIS — I4891 Unspecified atrial fibrillation: Secondary | ICD-10-CM | POA: Diagnosis not present

## 2011-12-08 MED ORDER — ATENOLOL 25 MG PO TABS: 12.5000 mg | ORAL_TABLET | Freq: Two times a day (BID) | ORAL | Status: DC

## 2011-12-08 NOTE — Patient Instructions (Addendum)
We are going to cut the atenolol back to 25 mg - take 1/2 tablet two times a day  May have some benadryl tonight for sleep.   Continue with his other medicines.  You have to keep up with your nutrition and drinking plenty of fluids!!  Monitor for any changes such as getting too sleepy, headaches, lethargy, etc. This may indicate bleeding in the brain.  I will see him back in 3 weeks.  Call the [facility] office at [phone] if you have any questions, problems or concerns.

## 2011-12-08 NOTE — Progress Notes (Signed)
Ricardo Perkins Date of Birth: 18-Sep-1922 Medical Record #[id number]  History of Present Illness: Mr. Ransome is seen today for a work in visit. He is seen for Dr. [name]. He is an 78 year old male who has had recent right TKR in the setting of PAF, BPH, HTN, chronic insomnia, and COPD that is oxygen dependent. Has normal LV function and a past Myoview that was felt to be satisfactory in 2009.   He comes in today. He is here with his daughter Ricardo Perkins. She provides most of the history. He apparently had his knee surgery about 6 weeks ago. Did ok initially but then got readmitted with elevated LFT's and dehydration. He has lost over 30 pounds of weight since this surgery. He is not eating very much nor drinking very much. He has been at his daughter's house and has just recently returned home. Blood pressure and heart rate running low. He was given 30 mg of Restoril this past Saturday night. He was confused on Sunday morning and fell. He did hit his head and had a laceration to the right ear. For unclear reasons, he had not taken any of his medicines for 3 days prior. He was in the ER on Sunday. CT of the head was ok. His labs were ok. D-dimer was elevated but CT scanning of the chest was negative for PE. He was sent home. Told to follow up with PCP but could not be seen til sometime in June. Daughter has remained quite concerned and wanted him re-evaluated. He denies chest pain. He does get dizzy with standing. He is still not eating/drinking enough. He wants a sedative.   Current Outpatient Prescriptions on File Prior to Visit  Medication Sig Dispense Refill  . finasteride (PROSCAR) 5 MG tablet Take 5 mg by mouth daily.       . meclizine (ANTIVERT) 12.5 MG tablet Take 2 tablets (25 mg total) by mouth 3 (three) times daily as needed.  30 tablet  0  . omeprazole (PRILOSEC) 20 MG capsule Take 1 capsule (20 mg total) by mouth daily.  90 capsule  2  . Tamsulosin HCl (FLOMAX) 0.4 MG CAPS Take 0.4 mg by mouth 2  (two) times daily.       Marland Kitchen warfarin (COUMADIN) 3 MG tablet Take 3 mg by mouth daily.       Current Facility-Administered Medications on File Prior to Visit  Medication Dose Route Frequency Provider Last Rate Last Dose  . promethazine (PHENERGAN) injection 25 mg  25 mg Intramuscular Q6H PRN Corwin Levins, MD   25 mg at 11/06/11 1015    No Known Allergies  Past Medical History  Diagnosis Date  . INSOMNIA, PERSISTENT 08/22/2008  . DECREASED HEARING 10/26/2007  . HYPERTENSION 07/23/2008  . BRADYCARDIA 04/01/2009  . C O P D 10/26/2007  . RENAL INSUFFICIENCY 04/01/2009  . BENIGN PROSTATIC HYPERTROPHY, WITH OBSTRUCTION 11/21/2007  . ASTEATOTIC ECZEMA 03/29/2008  . LEG EDEMA, BILATERAL 01/07/2010  . PAROXYSMAL ATRIAL FIBRILLATION 11/21/2007  . Arthritis     knees, shoulders   . COPD (chronic obstructive pulmonary disease)     on chronic oxygen therapy.   . OA (osteoarthritis)   . Chronic anticoagulation   . Advanced age     Past Surgical History  Procedure Date  . Appendectomy     @ 23  . Tonsillectomy   . Hernia repair 01/2011    bilateral inguinal hernia   . Eye surgery     bilateral cataract  surgery   . Other surgical history     left finger surgery   . Knee surgery right  . Total knee arthroplasty 10/26/2011    Procedure: TOTAL KNEE ARTHROPLASTY;  Surgeon: Loanne Drilling, MD;  Location: [facility];  Service: Orthopedics;  Laterality: Right;    History  Smoking status  . Former Smoker -- 7 years  . Quit date: 07/21/2007  Smokeless tobacco  . Never Used  Comment: History of very heavy smoking.  Quite about 4 years ago.    History  Alcohol Use  . Yes    wine occasional     Family History  Problem Relation Age of Onset  . Heart disease Father   . Heart attack Father   . Cancer Mother     colon   . Cirrhosis Sister     liver failure  . Heart attack Brother   . Diabetes Neg Hx     Review of Systems: The review of systems is per the HPI.  All other systems were reviewed and  are negative.  Physical Exam: BP 108/70  Pulse 60  Ht 5\' 9"  (1.753 m)  Wt 163 lb (73.936 kg)  BMI 24.07 kg/m2  SpO2 94% Patient is very pleasant and in no acute distress. He does look chronically ill. Skin is warm and dry. Color is quite sallow.  HEENT is unremarkable. Normocephalic/atraumatic. PERRL. Sclera are nonicteric. Neck is supple. No masses. No JVD. Lungs are fairly clear. Cardiac exam shows a regular rate and rhythm. Abdomen is soft. Extremities are with trace edema on the right. Opscar on the right knee is ok. Gait and ROM are intact but he using a cane. He has oxygen in place. No gross neurologic deficits noted.  LABORATORY DATA: Reviewed from the ER visit 12/06/2011   Lab Results  Component Value Date   WBC 4.8 12/06/2011   HGB 13.1 12/06/2011   HCT 39.0 12/06/2011   PLT 172 12/06/2011   GLUCOSE 96 12/06/2011   ALT 13 12/06/2011   AST 13 12/06/2011   NA 141 12/06/2011   K 4.0 12/06/2011   CL 104 12/06/2011   CREATININE 1.20 12/06/2011   BUN 19 12/06/2011   CO2 27 12/06/2011   TSH 0.87 11/10/2007   INR 2.23* 12/06/2011   Dg Chest 2 View  12/06/2011  *RADIOLOGY REPORT*  Clinical Data: Sudden onset of loss of consciousness 12 hours ago. Dizziness and nausea.  CHEST - 2 VIEW  Comparison: Chest x-ray dated 12/13/2010  Findings: Heart size and pulmonary vascularity are normal.  There is tortuosity of the thoracic aorta.  No infiltrates or effusions. Slight chronic accentuation of the interstitial markings at the right lung base.  IMPRESSION: No acute abnormalities.  Original Report Authenticated By: Gwynn Burly, M.D.   Ct Head Wo Contrast  12/06/2011  *RADIOLOGY REPORT*  Clinical Data: Syncope this morning for 5 minutes.  The patient struck her head and has blood coming from the right ear.  CT HEAD WITHOUT CONTRAST  Technique:  Contiguous axial images were obtained from the base of the skull through the vertex without contrast.  Comparison: Brain MRI dated 12/10/2008 and a CT scan of  the head dated 12/10/2008  Findings: There is no acute intracranial hemorrhage, infarction, or mass lesion.  Brain parenchyma is normal for age with minimal diffuse atrophy, unchanged.  No evidence of basilar skull fracture or opacification of the middle ear cavities or mastoid air cells or other significant osseous abnormality.  IMPRESSION: No  significant abnormalities.  Original Report Authenticated By: Gwynn Burly, M.D.   Ct Angio Chest W/cm &/or Wo Cm  12/06/2011  *RADIOLOGY REPORT*  Clinical Data: Syncope, elevated D-dimer  CT ANGIOGRAPHY CHEST  Technique:  Multidetector CT imaging of the chest using the standard protocol during bolus administration of intravenous contrast. Multiplanar reconstructed images including MIPs were obtained and reviewed to evaluate the vascular anatomy.  Contrast: 80mL OMNIPAQUE IOHEXOL 350 MG/ML SOLN  Comparison: None.  Findings: There are no filling defects within the pulmonary arteries to suggest acute pulmonary embolism.  No acute findings of the aorta or great vessels.  No pericardial fluid.  The esophagus is normal.  Small paratracheal lymph nodes are present.  No axillary lymphadenopathy or supraclavicular adenopathy.  Review of the lung parenchyma demonstrates central lobular and paraseptal emphysema.  There is mild architectural distortion at the lung bases with early honeycombing.  No pleural fluid or pneumothorax.  Mild bronchiectasis.  Limited view of the upper abdomen is unremarkable.  Limited view of the skeleton is unremarkable.  IMPRESSION:  1.  No evidence of acute pulmonary embolism. 2.  Emphysematous change in the lungs. 3.  Small mediastinal lymph nodes are likely related to COPD.  Original Report Authenticated By: Genevive Bi, M.D.    Assessment / Plan:

## 2011-12-08 NOTE — Telephone Encounter (Signed)
Patient's daughter Chip Boer called,stated she wanted father to be seen today.Stated he had fainted this past Sunday and fell.Stated he was taken to [facility] ER.States she is concerned he has missed coumadin x 3 doses and was told he may have a blood clot.States Dr.Norins is out of office and next available appointment 12/29/11.Appointment scheduled with Norma Fredrickson NP today 12/08/11 at 3:15 pm.

## 2011-12-08 NOTE — Assessment & Plan Note (Signed)
Patient has a history of PAF and has been on chronic coumadin. He is now falling. Has hit his head and had a laceration to the ear. His INR on Sunday was 2.3. He had not taken his dose for the 3 days prior. I have discussed with he and his daughter that if he continues to fall, then coumadin will need to be discontinued, as it will not be safe for him to take. She really did not want to hear this. I have asked her to be on the lookout for any neurologic changes i.e., headache, lethargy, nausea, etc.over the next several weeks. He is wanting sleep medicine which I refused. He can try low dose Benadryl but I don't think Restoril is safe for him at this time either. He has lost over 30 pounds of weight due to not eating. He looks malnourished. I have cut his atenolol back to just 25 mg taking 1/2 BID. We will see him back in 3 weeks. He has a protime appointment next week. I have asked the daughter to supervise his medicines. If he cannot start gaining weight, then other testing may need to be considered which I would defer to primary care. Patient and his daughter are agreeable to this plan and will call if any problems develop in the interim.

## 2011-12-08 NOTE — Telephone Encounter (Signed)
error 

## 2011-12-08 NOTE — Telephone Encounter (Signed)
The pt's daughter called and stated the patient had fallen on the 20th.  I offered her an appointment with a different physician in the practice and I offered to send a note to Dr.Norins.  Both of these options did not please the daughter.  I explained to the daughter I couldn't guarantee a response to the phone note directly from Dr.Norins during the week.  She became upset and stated she would call Dr. Ludwig Clarks office.  Is there something else that can be done?  I'll be happy to call her back and relay any information.  Thanks!

## 2011-12-08 NOTE — Telephone Encounter (Signed)
[name] thinks pt has blood clot and she wants her father seen today he has had another fall and has not taken coumadin in the last 3 days. Daughter is very concerned because pt is very dizzy

## 2011-12-09 ENCOUNTER — Ambulatory Visit: Payer: Medicare Other | Admitting: Nurse Practitioner

## 2011-12-10 ENCOUNTER — Ambulatory Visit: Payer: Medicare Other | Admitting: Rehabilitation

## 2011-12-10 DIAGNOSIS — R269 Unspecified abnormalities of gait and mobility: Secondary | ICD-10-CM | POA: Diagnosis not present

## 2011-12-10 DIAGNOSIS — M25569 Pain in unspecified knee: Secondary | ICD-10-CM | POA: Diagnosis not present

## 2011-12-10 DIAGNOSIS — IMO0001 Reserved for inherently not codable concepts without codable children: Secondary | ICD-10-CM | POA: Diagnosis not present

## 2011-12-10 DIAGNOSIS — R5381 Other malaise: Secondary | ICD-10-CM | POA: Diagnosis not present

## 2011-12-10 DIAGNOSIS — M25669 Stiffness of unspecified knee, not elsewhere classified: Secondary | ICD-10-CM | POA: Diagnosis not present

## 2011-12-11 ENCOUNTER — Ambulatory Visit: Payer: Medicare Other | Admitting: Rehabilitation

## 2011-12-11 DIAGNOSIS — R269 Unspecified abnormalities of gait and mobility: Secondary | ICD-10-CM | POA: Diagnosis not present

## 2011-12-11 DIAGNOSIS — R5381 Other malaise: Secondary | ICD-10-CM | POA: Diagnosis not present

## 2011-12-11 DIAGNOSIS — IMO0001 Reserved for inherently not codable concepts without codable children: Secondary | ICD-10-CM | POA: Diagnosis not present

## 2011-12-11 DIAGNOSIS — M25569 Pain in unspecified knee: Secondary | ICD-10-CM | POA: Diagnosis not present

## 2011-12-11 DIAGNOSIS — M25669 Stiffness of unspecified knee, not elsewhere classified: Secondary | ICD-10-CM | POA: Diagnosis not present

## 2011-12-11 NOTE — Telephone Encounter (Signed)
I was out of town. Nothing else needed to be offered.

## 2011-12-12 ENCOUNTER — Other Ambulatory Visit: Payer: Self-pay | Admitting: Internal Medicine

## 2011-12-16 ENCOUNTER — Ambulatory Visit: Payer: Medicare Other | Admitting: Physical Therapy

## 2011-12-16 ENCOUNTER — Other Ambulatory Visit: Payer: Self-pay | Admitting: Internal Medicine

## 2011-12-16 DIAGNOSIS — M25569 Pain in unspecified knee: Secondary | ICD-10-CM | POA: Diagnosis not present

## 2011-12-16 DIAGNOSIS — M25669 Stiffness of unspecified knee, not elsewhere classified: Secondary | ICD-10-CM | POA: Diagnosis not present

## 2011-12-16 DIAGNOSIS — R269 Unspecified abnormalities of gait and mobility: Secondary | ICD-10-CM | POA: Diagnosis not present

## 2011-12-16 DIAGNOSIS — IMO0001 Reserved for inherently not codable concepts without codable children: Secondary | ICD-10-CM | POA: Diagnosis not present

## 2011-12-16 DIAGNOSIS — R5381 Other malaise: Secondary | ICD-10-CM | POA: Diagnosis not present

## 2011-12-16 NOTE — Telephone Encounter (Signed)
Caller: Vicki/Child; PCP: Illene Regulus; CB#: [phone]; ; ; Call regarding Proscar Rx Not Sent To Pharmacy; states [facility], but the [facility]scar [facility] was not.  Per Epic, proscar 5 mg 1 po daily #30 [facility] 12/08/11.  TC to pharmacy; Rx as per Epic orders proscar 5mg , 1 po qd, #30 called in to [facility] [phone].

## 2011-12-17 ENCOUNTER — Ambulatory Visit (INDEPENDENT_AMBULATORY_CARE_PROVIDER_SITE_OTHER): Payer: Medicare Other

## 2011-12-17 DIAGNOSIS — I4891 Unspecified atrial fibrillation: Secondary | ICD-10-CM | POA: Diagnosis not present

## 2011-12-18 ENCOUNTER — Ambulatory Visit: Payer: Medicare Other | Admitting: Physical Therapy

## 2011-12-18 DIAGNOSIS — R269 Unspecified abnormalities of gait and mobility: Secondary | ICD-10-CM | POA: Diagnosis not present

## 2011-12-18 DIAGNOSIS — M25669 Stiffness of unspecified knee, not elsewhere classified: Secondary | ICD-10-CM | POA: Diagnosis not present

## 2011-12-18 DIAGNOSIS — R5381 Other malaise: Secondary | ICD-10-CM | POA: Diagnosis not present

## 2011-12-18 DIAGNOSIS — IMO0001 Reserved for inherently not codable concepts without codable children: Secondary | ICD-10-CM | POA: Diagnosis not present

## 2011-12-18 DIAGNOSIS — M25569 Pain in unspecified knee: Secondary | ICD-10-CM | POA: Diagnosis not present

## 2011-12-22 ENCOUNTER — Ambulatory Visit: Payer: Medicare Other | Attending: Orthopedic Surgery | Admitting: Rehabilitation

## 2011-12-22 DIAGNOSIS — IMO0001 Reserved for inherently not codable concepts without codable children: Secondary | ICD-10-CM | POA: Insufficient documentation

## 2011-12-22 DIAGNOSIS — M25669 Stiffness of unspecified knee, not elsewhere classified: Secondary | ICD-10-CM | POA: Insufficient documentation

## 2011-12-22 DIAGNOSIS — M25569 Pain in unspecified knee: Secondary | ICD-10-CM | POA: Diagnosis not present

## 2011-12-22 DIAGNOSIS — R269 Unspecified abnormalities of gait and mobility: Secondary | ICD-10-CM | POA: Diagnosis not present

## 2011-12-22 DIAGNOSIS — R5381 Other malaise: Secondary | ICD-10-CM | POA: Diagnosis not present

## 2011-12-24 ENCOUNTER — Ambulatory Visit: Payer: Medicare Other | Admitting: Rehabilitation

## 2011-12-25 ENCOUNTER — Ambulatory Visit: Payer: Medicare Other | Admitting: Rehabilitation

## 2011-12-28 ENCOUNTER — Ambulatory Visit: Payer: Medicare Other | Admitting: Physical Therapy

## 2011-12-29 ENCOUNTER — Ambulatory Visit: Payer: Medicare Other | Admitting: Nurse Practitioner

## 2011-12-31 ENCOUNTER — Ambulatory Visit: Payer: Medicare Other | Admitting: Rehabilitation

## 2011-12-31 ENCOUNTER — Ambulatory Visit (INDEPENDENT_AMBULATORY_CARE_PROVIDER_SITE_OTHER): Payer: Medicare Other | Admitting: *Deleted

## 2011-12-31 ENCOUNTER — Ambulatory Visit: Payer: Medicare Other | Admitting: Internal Medicine

## 2011-12-31 DIAGNOSIS — I4891 Unspecified atrial fibrillation: Secondary | ICD-10-CM | POA: Diagnosis not present

## 2012-01-02 ENCOUNTER — Ambulatory Visit (INDEPENDENT_AMBULATORY_CARE_PROVIDER_SITE_OTHER): Payer: Medicare Other | Admitting: Family Medicine

## 2012-01-02 DIAGNOSIS — R339 Retention of urine, unspecified: Secondary | ICD-10-CM

## 2012-01-02 NOTE — Progress Notes (Signed)
Called pt.  Very minimal UOP this AM.  In case he needs a catheter, I advised ER eval.  He agreed.

## 2012-01-04 ENCOUNTER — Ambulatory Visit: Payer: Medicare Other | Admitting: Rehabilitation

## 2012-01-05 ENCOUNTER — Telehealth: Payer: Self-pay

## 2012-01-05 NOTE — Telephone Encounter (Signed)
[facility] Triage Call Report Triage Record Num: [id number] Operator: Geanie Berlin Patient Name: Ricardo Perkins Call Date & Time: 01/02/2012 8:47:06AM Patient Phone: [phone] PCP: Illene Regulus Patient Gender: Male PCP Fax : [phone] Patient DOB: 1922/12/22 Practice Name: [facility] Reason for Call: Caller: Vicki/Other; PCP: Illene Regulus; CB#: [phone]; Call regarding Urinary Pain.; Unable to pass urine; passed trickle at 0730 but feels very full. Asking for cath now.Declined triage. Appt scheduled for next available appt at 1045 6/15/1; per nursing judgement, called office/Amy for permission to come in now. Appt scheduled for caller requesting appt, triage offered and declined per PCP Call, No Triage Guideline. Protocol(s) Used: PCP Calls, No Triage (Adult) Recommended Outcome per Protocol: Call Provider within 72 Hours Override Outcome if Used in Protocol: See Provider Immediately RN Reason for Override Outcome: Nursing Judgement Used. Reason for Outcome: Caller requesting an appointment, triage offered and declined Care Advice: ~ 01/02/2012 8:57:56AM Page 1 of 1 [id number]

## 2012-01-06 ENCOUNTER — Ambulatory Visit: Payer: Medicare Other | Admitting: Physical Therapy

## 2012-01-08 ENCOUNTER — Ambulatory Visit: Payer: Medicare Other | Admitting: Internal Medicine

## 2012-01-09 ENCOUNTER — Other Ambulatory Visit: Payer: Self-pay | Admitting: Internal Medicine

## 2012-01-11 ENCOUNTER — Ambulatory Visit: Payer: Medicare Other | Admitting: Physical Therapy

## 2012-01-11 NOTE — Telephone Encounter (Signed)
Last Rx fill date 05.29.13 #30x0--too soon/[facility]

## 2012-01-13 ENCOUNTER — Ambulatory Visit: Payer: Medicare Other | Admitting: Physical Therapy

## 2012-01-14 ENCOUNTER — Ambulatory Visit (INDEPENDENT_AMBULATORY_CARE_PROVIDER_SITE_OTHER): Payer: Medicare Other | Admitting: *Deleted

## 2012-01-14 ENCOUNTER — Other Ambulatory Visit: Payer: Self-pay | Admitting: Internal Medicine

## 2012-01-14 DIAGNOSIS — I4891 Unspecified atrial fibrillation: Secondary | ICD-10-CM | POA: Diagnosis not present

## 2012-01-14 LAB — POCT INR: INR: 2

## 2012-01-17 ENCOUNTER — Emergency Department (HOSPITAL_BASED_OUTPATIENT_CLINIC_OR_DEPARTMENT_OTHER)
Admission: EM | Admit: 2012-01-17 | Discharge: 2012-01-17 | Disposition: A | Discharge status: Home / Self Care | Payer: Medicare Other

## 2012-01-18 ENCOUNTER — Ambulatory Visit (INDEPENDENT_AMBULATORY_CARE_PROVIDER_SITE_OTHER): Payer: Medicare Other | Admitting: Internal Medicine

## 2012-01-18 ENCOUNTER — Encounter: Payer: Self-pay | Admitting: Internal Medicine

## 2012-01-18 ENCOUNTER — Ambulatory Visit: Payer: Medicare Other | Attending: Orthopedic Surgery | Admitting: Physical Therapy

## 2012-01-18 VITALS — BP 122/82 | HR 62 | Temp 97.7°F | Ht 67.0 in | Wt 173.0 lb

## 2012-01-18 DIAGNOSIS — L509 Urticaria, unspecified: Secondary | ICD-10-CM | POA: Diagnosis not present

## 2012-01-18 DIAGNOSIS — I4891 Unspecified atrial fibrillation: Secondary | ICD-10-CM | POA: Diagnosis not present

## 2012-01-18 DIAGNOSIS — IMO0001 Reserved for inherently not codable concepts without codable children: Secondary | ICD-10-CM | POA: Insufficient documentation

## 2012-01-18 DIAGNOSIS — R5381 Other malaise: Secondary | ICD-10-CM | POA: Diagnosis not present

## 2012-01-18 DIAGNOSIS — R269 Unspecified abnormalities of gait and mobility: Secondary | ICD-10-CM | POA: Diagnosis not present

## 2012-01-18 DIAGNOSIS — M25669 Stiffness of unspecified knee, not elsewhere classified: Secondary | ICD-10-CM | POA: Insufficient documentation

## 2012-01-18 DIAGNOSIS — M25569 Pain in unspecified knee: Secondary | ICD-10-CM | POA: Insufficient documentation

## 2012-01-18 DIAGNOSIS — N401 Enlarged prostate with lower urinary tract symptoms: Secondary | ICD-10-CM

## 2012-01-18 MED ORDER — METHYLPREDNISOLONE ACETATE 80 MG/ML IJ SUSP
120.0000 mg | Freq: Once | INTRAMUSCULAR | Status: AC
  Administered 2012-01-18: 120 mg via INTRAMUSCULAR

## 2012-01-18 NOTE — Assessment & Plan Note (Signed)
Med mgmt ongoing and reviewed - flomax + proscar The current medical regimen is effective;  continue present plan and medications.

## 2012-01-18 NOTE — Assessment & Plan Note (Signed)
Recent anticoag dose and reduced beta-blocker dose reviewed No "new" medications, only reduced doses

## 2012-01-18 NOTE — Progress Notes (Signed)
  Subjective:    Patient ID: Ricardo Perkins, male    DOB: [birth year], 78 y.o.   MRN: [id number]  HPI  complains of hives Onset 3 days ago - unimproved but not spreading Located upper body - especially axilla and arms denied new med/lotion/soap or outdoor exposure - no pets No hx same Not improved with Benadryl  Past Medical History  Diagnosis Date  . INSOMNIA, PERSISTENT   . DECREASED HEARING   . HYPERTENSION   . BRADYCARDIA   . C O P D   . RENAL INSUFFICIENCY   . BENIGN PROSTATIC HYPERTROPHY, WITH OBSTRUCTION   . ASTEATOTIC ECZEMA   . LEG EDEMA, BILATERAL   . PAROXYSMAL ATRIAL FIBRILLATION   . Arthritis     knees, shoulders   . COPD (chronic obstructive pulmonary disease)     on chronic oxygen therapy.   . OA (osteoarthritis)   . Chronic anticoagulation   . Advanced age    Review of Systems  Constitutional: Negative for fever and fatigue.  Eyes: Negative for itching.  Respiratory: Negative for shortness of breath and wheezing.   Skin: Positive for rash.       Objective:   Physical Exam BP 122/82  Pulse 62  Temp 97.7 F (36.5 C) (Oral)  Ht 5\' 7"  (1.702 m)  Wt 173 lb (78.472 kg)  BMI 27.10 kg/m2  SpO2 94% Constitutional:  He appears well-developed and well-nourished. No distress/nontoxic.  Dtr at side Neck: Normal range of motion. Neck supple. No JVD present. No thyromegaly present.  Cardiovascular: Normal rate, regular rhythm and normal heart sounds.  No murmur heard. no BLE edema Pulmonary/Chest: Effort normal and breath sounds normal. No respiratory distress. no wheezes.  Skin: Few urticaria along BUE - concentrated at axilla with excoriation - other skin is warm and dry.  No blistering or ulceration.  Psychiatric: he has a normal mood and affect. behavior is normal. Judgment and thought content normal.        Assessment & Plan:  Urticaria, idiopathic - rx steroids- selected IM vs oral Continue antihistamine and topical steroids prn Call if worse or  unimproved

## 2012-01-18 NOTE — Patient Instructions (Signed)
It was good to see you today. We have reviewed your prior records including labs and tests today Steroid shot given to you in office today for allergic reaction (hives) Ok to use Benadryl as needed for itch and continue cortisone cream as needed Allergic Reaction, Mild to Moderate Allergies may happen from anything your body is sensitive to. This may be food, medications, pollens, chemicals, and nearly anything around you in everyday life that produces allergens. An allergen is anything that causes an allergy producing substance. Allergens cause your body to release allergic antibodies. Through a chain of events, they cause a release of histamine into the blood stream. Histamines are meant to protect you, but they also cause your discomfort. This is why antihistamines are often used for allergies. Heredity is often a factor in causing allergic reactions. This means you may have some of the same allergies as your parents. Allergies happen in all age groups. You may have some idea of what caused your reaction. There are many allergens around [place]. It may be difficult to know what caused your reaction. If this is a first time event, it may never happen again. Allergies cannot be cured but can be controlled with medications. SYMPTOMS   You may get some or all of the following problems from allergies.  Swelling and itching in and around the mouth.   Tearing, itchy eyes.   Nasal congestion and runny nose.   Sneezing and coughing.   An itchy red rash or hives.   Vomiting or diarrhea.   Difficulty breathing.  Seasonal allergies occur in all age groups. They are seasonal because they usually occur during the same season every year. They may be a reaction to molds, grass pollens, or tree pollens. Other causes of allergies are house dust mite allergens, pet dander and mold spores. These are just a common few of the thousands of allergens around [place]. All of the symptoms listed above happen when you come in  contact with pollens and other allergens. Seasonal allergies are usually not life threatening. They are generally more of a nuisance that can often be handled using medications. Hay fever is a combination of all or some of the above listed allergy problems. It may often be treated with simple over-the-counter medications such as diphenhydramine. Take medication as directed. Check with your caregiver or package insert for child dosages. TREATMENT AND HOME CARE INSTRUCTIONS If hives or rash are present:  Take medications as directed.   You may use an over-the-counter antihistamine (diphenhydramine) for hives and itching as needed. Do not drive or drink alcohol until medications used to treat the reaction have worn off. Antihistamines tend to make people sleepy.   Apply cold cloths (compresses) to the skin or take baths in cool water. This will help itching. Avoid hot baths or showers. Heat will make a rash and itching worse.   If your allergies persist and become more severe, and over the counter medications are not effective, there are many new medications your caretaker can prescribe. Immunotherapy or desensitizing injections can be used if all else fails. Follow up with your caregiver if problems continue.  SEEK MEDICAL CARE IF:    Your allergies are becoming progressively more troublesome.   You suspect a food allergy. Symptoms generally happen within 30 minutes of eating a food.   Your symptoms have not gone away within 2 days or are getting worse.   You develop new symptoms.   You want to retest yourself or your child with  a food or drink you think causes an allergic reaction. Never test yourself or your child of a suspected allergy without being under the watchful eye of your caregivers. A second exposure to an allergen may be life-threatening.  SEEK IMMEDIATE MEDICAL CARE IF:  You develop difficulty breathing or wheezing, or have a tight feeling in your chest or throat.   You develop  a swollen mouth, hives, swelling, or itching all over your body.  A severe reaction with any of the above problems should be considered life-threatening. If you suddenly develop difficulty breathing call for local emergency medical help. THIS IS AN EMERGENCY. MAKE SURE YOU:    Understand these instructions.   Will watch your condition.   Will get help right away if you are not doing well or get worse.  Document Released: 05/03/2007 Document Revised: 06/25/2011 Document Reviewed: 05/03/2007 [facility] Patient Information 2012 [place], [place].

## 2012-01-20 ENCOUNTER — Ambulatory Visit (INDEPENDENT_AMBULATORY_CARE_PROVIDER_SITE_OTHER): Payer: Medicare Other | Admitting: Internal Medicine

## 2012-01-20 ENCOUNTER — Ambulatory Visit: Payer: Medicare Other | Admitting: Physical Therapy

## 2012-01-20 ENCOUNTER — Encounter: Payer: Self-pay | Admitting: Internal Medicine

## 2012-01-20 VITALS — BP 154/70 | HR 57 | Temp 97.5°F | Ht 67.0 in

## 2012-01-20 DIAGNOSIS — R269 Unspecified abnormalities of gait and mobility: Secondary | ICD-10-CM | POA: Diagnosis not present

## 2012-01-20 DIAGNOSIS — R5381 Other malaise: Secondary | ICD-10-CM | POA: Diagnosis not present

## 2012-01-20 DIAGNOSIS — M25669 Stiffness of unspecified knee, not elsewhere classified: Secondary | ICD-10-CM | POA: Diagnosis not present

## 2012-01-20 DIAGNOSIS — M25569 Pain in unspecified knee: Secondary | ICD-10-CM | POA: Diagnosis not present

## 2012-01-20 DIAGNOSIS — IMO0001 Reserved for inherently not codable concepts without codable children: Secondary | ICD-10-CM | POA: Diagnosis not present

## 2012-01-20 DIAGNOSIS — L509 Urticaria, unspecified: Secondary | ICD-10-CM | POA: Diagnosis not present

## 2012-01-20 DIAGNOSIS — T7840XA Allergy, unspecified, initial encounter: Secondary | ICD-10-CM | POA: Diagnosis not present

## 2012-01-20 DIAGNOSIS — N400 Enlarged prostate without lower urinary tract symptoms: Secondary | ICD-10-CM | POA: Diagnosis not present

## 2012-01-20 MED ORDER — RANITIDINE HCL 150 MG PO TABS: 150.0000 mg | ORAL_TABLET | Freq: Two times a day (BID) | ORAL | Status: DC

## 2012-01-20 MED ORDER — PREDNISONE (PAK) 10 MG PO TABS: 10.0000 mg | ORAL_TABLET | ORAL | Status: DC

## 2012-01-20 MED ORDER — TRIAMCINOLONE ACETONIDE 0.1 % EX CREA: TOPICAL_CREAM | Freq: Two times a day (BID) | CUTANEOUS | Status: DC

## 2012-01-20 NOTE — Patient Instructions (Signed)
It was good to see you today. Pred pak x 6 days, steroid cream as needed Stop prilosec and use Zantac for reflux Your prescription(s) have been submitted to your pharmacy. Please take as directed and contact our office if you believe you are having problem(s) with the medication(s).

## 2012-01-20 NOTE — Progress Notes (Signed)
  Subjective:    Patient ID: Ricardo Perkins, male    DOB: Mar 30, 1923, 78 y.o.   MRN: [id number]  Rash Pertinent negatives include no fatigue, fever or shortness of breath.    complains of continued hives Onset 5 days ago - unimproved but not spreading Located upper body - especially axilla and arms denied new med/lotion/soap or outdoor exposure - no pets No hx same Not improved with Benadryl Given IM medrol 48h ago: much improved for 1st 24h, but recurrence in last 12h  Past Medical History  Diagnosis Date  . INSOMNIA, PERSISTENT   . DECREASED HEARING   . HYPERTENSION   . BRADYCARDIA   . C O P D   . RENAL INSUFFICIENCY   . BENIGN PROSTATIC HYPERTROPHY, WITH OBSTRUCTION   . ASTEATOTIC ECZEMA   . LEG EDEMA, BILATERAL   . PAROXYSMAL ATRIAL FIBRILLATION   . Arthritis     knees, shoulders   . COPD (chronic obstructive pulmonary disease)     on chronic oxygen therapy.   . OA (osteoarthritis)   . Chronic anticoagulation   . Advanced age    Review of Systems  Constitutional: Negative for fever and fatigue.  Eyes: Negative for itching.  Respiratory: Negative for shortness of breath and wheezing.   Skin: Positive for rash.       Objective:   Physical Exam  BP 154/70  Pulse 57  Temp 97.5 F (36.4 C) (Oral)  Ht 5\' 7"  (1.702 m)  SpO2 93% Constitutional:  He appears well-developed and well-nourished. No distress/nontoxic.  Dtr at side HENT: no angioedema Neck: Normal range of motion. Neck supple. No JVD present. No thyromegaly present.  Cardiovascular: Normal rate, regular rhythm and normal heart sounds.  No murmur heard. no BLE edema Pulmonary/Chest: Effort normal and breath sounds normal. No respiratory distress. no wheezes.  Skin: trace urticaria along BUE - few at axilla with excoriation - other skin is warm and dry.  No blistering or ulceration.  Psychiatric: he has a normal mood and affect. behavior is normal. Judgment and thought content normal.        Assessment  & Plan:  Urticaria, idiopathic - onset 5 days ago Dramatically improved for 1st 24h following IM medrol, now recurrence of itch and rash in past 12h pred pak x 6d now Continue antihistamine and topical steroids prn Also change PPI to H2B Consider stopping Flomax but on same >36yr and significant BPH hx Discussed possible allergy testing - declines same at this time follow up if worse or unimproved

## 2012-01-25 ENCOUNTER — Ambulatory Visit: Payer: Medicare Other | Admitting: Physical Therapy

## 2012-01-25 DIAGNOSIS — R5381 Other malaise: Secondary | ICD-10-CM | POA: Diagnosis not present

## 2012-01-25 DIAGNOSIS — M25669 Stiffness of unspecified knee, not elsewhere classified: Secondary | ICD-10-CM | POA: Diagnosis not present

## 2012-01-25 DIAGNOSIS — IMO0001 Reserved for inherently not codable concepts without codable children: Secondary | ICD-10-CM | POA: Diagnosis not present

## 2012-01-25 DIAGNOSIS — M25569 Pain in unspecified knee: Secondary | ICD-10-CM | POA: Diagnosis not present

## 2012-01-25 DIAGNOSIS — R269 Unspecified abnormalities of gait and mobility: Secondary | ICD-10-CM | POA: Diagnosis not present

## 2012-02-04 ENCOUNTER — Ambulatory Visit (INDEPENDENT_AMBULATORY_CARE_PROVIDER_SITE_OTHER): Payer: Medicare Other | Admitting: *Deleted

## 2012-02-04 DIAGNOSIS — I4891 Unspecified atrial fibrillation: Secondary | ICD-10-CM

## 2012-02-10 ENCOUNTER — Other Ambulatory Visit: Payer: Self-pay | Admitting: Cardiology

## 2012-02-12 ENCOUNTER — Other Ambulatory Visit: Payer: Self-pay | Admitting: Cardiology

## 2012-02-22 ENCOUNTER — Ambulatory Visit (INDEPENDENT_AMBULATORY_CARE_PROVIDER_SITE_OTHER): Payer: Medicare Other | Admitting: Internal Medicine

## 2012-02-22 ENCOUNTER — Encounter: Payer: Self-pay | Admitting: Internal Medicine

## 2012-02-22 VITALS — BP 118/70 | HR 50 | Temp 97.0°F | Ht 67.0 in | Wt 171.0 lb

## 2012-02-22 DIAGNOSIS — L259 Unspecified contact dermatitis, unspecified cause: Secondary | ICD-10-CM

## 2012-02-22 DIAGNOSIS — L509 Urticaria, unspecified: Secondary | ICD-10-CM

## 2012-02-22 DIAGNOSIS — L239 Allergic contact dermatitis, unspecified cause: Secondary | ICD-10-CM

## 2012-02-22 MED ORDER — TRIAMCINOLONE ACETONIDE 0.1 % EX CREA: TOPICAL_CREAM | Freq: Two times a day (BID) | CUTANEOUS | Status: DC

## 2012-02-22 MED ORDER — PREDNISONE (PAK) 10 MG PO TABS: 10.0000 mg | ORAL_TABLET | ORAL | Status: DC

## 2012-02-22 NOTE — Progress Notes (Signed)
  Subjective:    Patient ID: Ricardo Perkins, male    DOB: [birth year], 78 y.o.   MRN: [id number]  Urticaria Pertinent negatives include no fatigue, fever or shortness of breath.  Rash Pertinent negatives include no fatigue, fever or shortness of breath.    complains of recurrent hives hx same - 1 month ago OVx2 for same reviewed Onset current symptoms 6 hours ago - not spreading Located upper body - especially axilla and arms Recurrent outdoor exposure to tomato plants yesterday - same as 01/2012 per dtr Given IM medrol and 6d pred pak in 01/2012  Past Medical History  Diagnosis Date  . INSOMNIA, PERSISTENT   . DECREASED HEARING   . HYPERTENSION   . BRADYCARDIA   . C O P D   . RENAL INSUFFICIENCY   . BENIGN PROSTATIC HYPERTROPHY, WITH OBSTRUCTION   . ASTEATOTIC ECZEMA   . LEG EDEMA, BILATERAL   . PAROXYSMAL ATRIAL FIBRILLATION   . Arthritis     knees, shoulders   . COPD (chronic obstructive pulmonary disease)     on chronic oxygen therapy.   . OA (osteoarthritis)   . Chronic anticoagulation   . Advanced age    Review of Systems  Constitutional: Negative for fever and fatigue.  Eyes: Negative for itching.  Respiratory: Negative for shortness of breath and wheezing.   Skin: Positive for rash.       Objective:   Physical Exam  BP 118/70  Pulse 50  Temp 97 F (36.1 C) (Oral)  Ht 5\' 7"  (1.702 m)  Wt 171 lb (77.565 kg)  BMI 26.78 kg/m2  SpO2 91% Constitutional:  He appears well-developed and well-nourished. No distress/nontoxic.  Dtr at side HENT: no angioedema Neck: Normal range of motion. Neck supple. No JVD present. No thyromegaly present.  Cardiovascular: Normal rate, regular rhythm and normal heart sounds.  No murmur heard. no BLE edema Pulmonary/Chest: Effort normal and breath sounds normal. No respiratory distress. no wheezes.  Skin: trace urticaria along BUE - few at axilla with excoriation - also midaxillary line and lower torso above waistline, anterior and  back - other skin is warm and dry.  No blistering or ulceration.  Psychiatric: he has a normal mood and affect. behavior is normal. Judgment and thought content normal.   Lab Results  Component Value Date   WBC 4.8 12/06/2011   HGB 13.1 12/06/2011   HCT 39.0 12/06/2011   PLT 172 12/06/2011   GLUCOSE 96 12/06/2011   ALT 13 12/06/2011   AST 13 12/06/2011   NA 141 12/06/2011   K 4.0 12/06/2011   CL 104 12/06/2011   CREATININE 1.20 12/06/2011   BUN 19 12/06/2011   CO2 27 12/06/2011   TSH 0.87 11/10/2007   INR 3.1 02/04/2012       Assessment & Plan:  Urticaria, idiopathic, recurrent - onset current episode 6 hours ago, 1st episode 1 month ago Dramatically improved following IM medrol and pred pak x 6d - until now Recurrent exposure to tomato plants and garden per dtr yesterday - ? Topical contact reaction Repeat pred pak - erx done Continue antihistamine and topical steroids prn - renewed today Consider stopping Flomax but on same >34yr and significant BPH hx Discussed possible allergy testing again- but both decline need for same at this time follow up if worse or unimproved

## 2012-02-22 NOTE — Patient Instructions (Signed)
It was good to see you today. Pred pak x 6 days, and continue steroid cream as needed Your prescription(s) have been submitted to your pharmacy. Please take as directed and contact our office if you believe you are having problem(s) with the medication(s). Allergic Reaction Allergic reactions can be caused by anything your body is sensitive to. Your body may be sensitive to food, medicines, molds, pollens, cockroaches, dust mites, pets, insect stings, and other things around you. An allergic reaction may cause puffiness (swelling), itching, sneezing, coughing, or problems breathing.   Allergies cannot be cured, but they can be controlled with medicine. Some allergies happen only at certain times of the year. Try to stay away from what causes your reaction if possible. Sometimes, it is hard to tell what causes your reaction. HOME CARE If you have a rash or red patches (hives) on your skin:  Take medicines as told by your doctor.   Do not drive or drink alcohol after taking medicines. They can make you sleepy.   Put cold cloths on your skin. Take baths in cool water. This will help your itching. Do not take hot baths or showers. Heat will make the itching worse.   If your allergies get worse, your doctor might give you other medicines. Talk to your doctor if problems continue.  GET HELP RIGHT AWAY IF:    You have trouble breathing.   You have a tight feeling in your chest or throat.   Your mouth gets puffy (swollen).   You have red, itchy patches on your skin (hives) that get worse.   You have itching all over your body.  MAKE SURE YOU:    Understand these instructions.   Will watch your condition.   Will get help right away if you are not doing well or get worse.  Document Released: 06/24/2009 Document Revised: 06/25/2011 Document Reviewed: 06/24/2009 [facility] Patient Information 2012 [place], [place].

## 2012-02-23 ENCOUNTER — Other Ambulatory Visit: Payer: Self-pay | Admitting: *Deleted

## 2012-02-23 MED ORDER — WARFARIN SODIUM 3 MG PO TABS: 3.0000 mg | ORAL_TABLET | ORAL | Status: DC

## 2012-02-23 NOTE — Telephone Encounter (Signed)
Called patient and informed him e script sent to [facility]

## 2012-02-24 ENCOUNTER — Encounter: Payer: Self-pay | Admitting: Internal Medicine

## 2012-02-24 ENCOUNTER — Telehealth: Payer: Self-pay | Admitting: Internal Medicine

## 2012-02-24 ENCOUNTER — Ambulatory Visit (INDEPENDENT_AMBULATORY_CARE_PROVIDER_SITE_OTHER): Payer: Medicare Other | Admitting: Internal Medicine

## 2012-02-24 VITALS — BP 140/82 | HR 55 | Temp 97.0°F

## 2012-02-24 DIAGNOSIS — L509 Urticaria, unspecified: Secondary | ICD-10-CM | POA: Diagnosis not present

## 2012-02-24 MED ORDER — TRIAMCINOLONE ACETONIDE 0.1 % EX CREA: TOPICAL_CREAM | Freq: Two times a day (BID) | CUTANEOUS | Status: DC

## 2012-02-24 MED ORDER — PREDNISONE (PAK) 10 MG PO TABS: 10.0000 mg | ORAL_TABLET | ORAL | Status: AC

## 2012-02-24 MED ORDER — TRIAMCINOLONE ACETONIDE 0.1 % EX LOTN: TOPICAL_LOTION | Freq: Three times a day (TID) | CUTANEOUS | Status: DC

## 2012-02-24 MED ORDER — HYDROXYZINE HCL 10 MG PO TABS: 10.0000 mg | ORAL_TABLET | Freq: Three times a day (TID) | ORAL | Status: AC | PRN

## 2012-02-24 MED ORDER — METHYLPREDNISOLONE ACETATE 80 MG/ML IJ SUSP
120.0000 mg | Freq: Once | INTRAMUSCULAR | Status: AC
  Administered 2012-02-24: 120 mg via INTRAMUSCULAR

## 2012-02-24 NOTE — Progress Notes (Signed)
  Subjective:    Patient ID: Ricardo Perkins, male    DOB: 08/17/1922, 78 y.o.   MRN: [id number]  Rash Pertinent negatives include no fatigue, fever or shortness of breath.  Urticaria Pertinent negatives include no fatigue, fever or shortness of breath.    complains of persisting and recurrent hives hx same - 1 month ago OVx2 for same and seen here 48h ago for same Onset current symptoms >72h ago - not spreading Located upper body - especially axilla and arms Recurrent outdoor exposure to tomato plants 4 days ago - same as 01/2012 per dtr Given IM medrol and 6d pred pak in 01/2012 + topical steroids On pred pak now - requests "shot" since symptoms slow to improve on pred in past 36h  Past Medical History  Diagnosis Date  . INSOMNIA, PERSISTENT   . DECREASED HEARING   . HYPERTENSION   . BRADYCARDIA   . C O P D   . RENAL INSUFFICIENCY   . BENIGN PROSTATIC HYPERTROPHY, WITH OBSTRUCTION   . ASTEATOTIC ECZEMA   . LEG EDEMA, BILATERAL   . PAROXYSMAL ATRIAL FIBRILLATION   . Arthritis     knees, shoulders   . COPD (chronic obstructive pulmonary disease)     on chronic oxygen therapy.   . OA (osteoarthritis)   . Chronic anticoagulation   . Advanced age    Review of Systems  Constitutional: Negative for fever and fatigue.  Eyes: Negative for itching.  Respiratory: Negative for shortness of breath and wheezing.   Skin: Positive for rash.       Objective:   Physical Exam  BP 140/82  Pulse 55  Temp 97 F (36.1 C) (Oral)  SpO2 93% Constitutional:  He appears well-developed and well-nourished. No distress/nontoxic.  Dtr at side HENT: no angioedema Neck: Normal range of motion. Neck supple. No JVD present. No thyromegaly present.  Cardiovascular: Normal rate, regular rhythm and normal heart sounds.  No murmur heard. no BLE edema Pulmonary/Chest: Effort normal and breath sounds normal. No respiratory distress. no wheezes.  Skin: well defined urticaria patches along BUE - few at  axilla with excoriation - also midaxillary line and lower torso above waistline, anterior > back - other skin is warm and dry.  No blistering or ulceration.  Psychiatric: he has a normal mood and affect. behavior is normal. Judgment and thought content normal.   Lab Results  Component Value Date   WBC 4.8 12/06/2011   HGB 13.1 12/06/2011   HCT 39.0 12/06/2011   PLT 172 12/06/2011   GLUCOSE 96 12/06/2011   ALT 13 12/06/2011   AST 13 12/06/2011   NA 141 12/06/2011   K 4.0 12/06/2011   CL 104 12/06/2011   CREATININE 1.20 12/06/2011   BUN 19 12/06/2011   CO2 27 12/06/2011   TSH 0.87 11/10/2007   INR 3.1 02/04/2012       Assessment & Plan:  Urticaria, idiopathic, recurrent - onset current episode >72h ago, 1st episode 01/2012 Dramatically improved following IM medrol and pred pak x 6d 01/2012 Repeat IM medrol now and continue pred taper as ongoing Recurrent exposure to tomato plants and garden per dtr is suspected trigger of topical contact reaction Continue antihistamine and topical steroids prn - renewed today and changed benadryl to atarax Consider stopping Flomax but on same >42yr and significant BPH hx Discussed possible allergy testing again- but both decline need for same at this time follow up if worse or unimproved

## 2012-02-24 NOTE — Patient Instructions (Signed)
It was good to see you today. Steroid shot today Continue prednisoe taper Renew steroid cream and new prescription steroid lotion to use as needed for skin irritation Use Atarax as needed for itch in place of Benadryl Your prescription(s) have been submitted to your pharmacy. Please take as directed and contact our office if you believe you are having problem(s) with the medication(s). Allergic Reaction Allergic reactions can be caused by anything your body is sensitive to. Your body may be sensitive to food, medicines, molds, pollens, cockroaches, dust mites, pets, insect stings, and other things around you. An allergic reaction may cause puffiness (swelling), itching, sneezing, coughing, or problems breathing.   Allergies cannot be cured, but they can be controlled with medicine. Some allergies happen only at certain times of the year. Try to stay away from what causes your reaction if possible. Sometimes, it is hard to tell what causes your reaction. HOME CARE If you have a rash or red patches (hives) on your skin:  Take medicines as told by your doctor.   Do not drive or drink alcohol after taking medicines. They can make you sleepy.   Put cold cloths on your skin. Take baths in cool water. This will help your itching. Do not take hot baths or showers. Heat will make the itching worse.   If your allergies get worse, your doctor might give you other medicines. Talk to your doctor if problems continue.  GET HELP RIGHT AWAY IF:    You have trouble breathing.   You have a tight feeling in your chest or throat.   Your mouth gets puffy (swollen).   You have red, itchy patches on your skin (hives) that get worse.   You have itching all over your body.  MAKE SURE YOU:    Understand these instructions.   Will watch your condition.   Will get help right away if you are not doing well or get worse.  Document Released: 06/24/2009 Document Revised: 06/25/2011 Document Reviewed:  06/24/2009 [facility] Patient Information 2012 [place], [place].

## 2012-02-24 NOTE — Telephone Encounter (Signed)
Patient already seen today.

## 2012-02-24 NOTE — Telephone Encounter (Signed)
Caller: [name]/Child; PCP: Rene Paci; CB#: [phone]; ; ; Call regarding Rash/Hives;   Today, 02/24/2012, Daughter calling stating pt has been seen 3 times for rash that had started on chest ,  back and arms    ~ 01/18/2012  . Has  had steroid injections and had showed improvement. The rash reoccured and  had   OV 02/22/2012 and given Prednisone taper rx which he is still taking, but is out of the rx cream. The rash has now spread to legs, and is itching. Benaryl not very effective. Pt already has appointment for 1130 with Dr Felicity Coyer , but she wondered if he needed to come back.Daugher isn't with pt during call and RN offered to call and conference call for triage, and she declined and stating she would like refill of the cream, and will keep the scheduled appointment for today.

## 2012-02-25 ENCOUNTER — Ambulatory Visit (INDEPENDENT_AMBULATORY_CARE_PROVIDER_SITE_OTHER): Payer: Medicare Other | Admitting: Pharmacist

## 2012-02-25 DIAGNOSIS — I4891 Unspecified atrial fibrillation: Secondary | ICD-10-CM | POA: Diagnosis not present

## 2012-02-25 MED ORDER — WARFARIN SODIUM 3 MG PO TABS: ORAL_TABLET | ORAL | Status: DC

## 2012-02-26 ENCOUNTER — Emergency Department (HOSPITAL_BASED_OUTPATIENT_CLINIC_OR_DEPARTMENT_OTHER): Payer: Medicare Other

## 2012-02-26 ENCOUNTER — Emergency Department (HOSPITAL_BASED_OUTPATIENT_CLINIC_OR_DEPARTMENT_OTHER)
Admission: EM | Admit: 2012-02-26 | Discharge: 2012-02-26 | Disposition: A | Discharge status: Home / Self Care | Payer: Medicare Other | Attending: Emergency Medicine | Admitting: Emergency Medicine

## 2012-02-26 ENCOUNTER — Encounter (HOSPITAL_BASED_OUTPATIENT_CLINIC_OR_DEPARTMENT_OTHER): Payer: Self-pay | Admitting: *Deleted

## 2012-02-26 DIAGNOSIS — N401 Enlarged prostate with lower urinary tract symptoms: Secondary | ICD-10-CM | POA: Insufficient documentation

## 2012-02-26 DIAGNOSIS — K8020 Calculus of gallbladder without cholecystitis without obstruction: Secondary | ICD-10-CM | POA: Insufficient documentation

## 2012-02-26 DIAGNOSIS — N201 Calculus of ureter: Secondary | ICD-10-CM | POA: Insufficient documentation

## 2012-02-26 DIAGNOSIS — R109 Unspecified abdominal pain: Secondary | ICD-10-CM | POA: Insufficient documentation

## 2012-02-26 DIAGNOSIS — Z79899 Other long term (current) drug therapy: Secondary | ICD-10-CM | POA: Insufficient documentation

## 2012-02-26 DIAGNOSIS — Z96659 Presence of unspecified artificial knee joint: Secondary | ICD-10-CM | POA: Diagnosis not present

## 2012-02-26 DIAGNOSIS — I10 Essential (primary) hypertension: Secondary | ICD-10-CM | POA: Insufficient documentation

## 2012-02-26 DIAGNOSIS — M1990 Unspecified osteoarthritis, unspecified site: Secondary | ICD-10-CM | POA: Diagnosis not present

## 2012-02-26 DIAGNOSIS — J449 Chronic obstructive pulmonary disease, unspecified: Secondary | ICD-10-CM | POA: Insufficient documentation

## 2012-02-26 DIAGNOSIS — J4489 Other specified chronic obstructive pulmonary disease: Secondary | ICD-10-CM | POA: Insufficient documentation

## 2012-02-26 DIAGNOSIS — N200 Calculus of kidney: Secondary | ICD-10-CM

## 2012-02-26 DIAGNOSIS — N138 Other obstructive and reflux uropathy: Secondary | ICD-10-CM | POA: Insufficient documentation

## 2012-02-26 DIAGNOSIS — I4891 Unspecified atrial fibrillation: Secondary | ICD-10-CM | POA: Diagnosis not present

## 2012-02-26 LAB — URINALYSIS, ROUTINE W REFLEX MICROSCOPIC
Glucose, UA: NEGATIVE mg/dL
Ketones, ur: NEGATIVE mg/dL
Leukocytes, UA: NEGATIVE
Nitrite: NEGATIVE
Protein, ur: NEGATIVE mg/dL

## 2012-02-26 LAB — URINE MICROSCOPIC-ADD ON

## 2012-02-26 LAB — CBC WITH DIFFERENTIAL/PLATELET
Basophils Absolute: 0 10*3/uL (ref 0.0–0.1)
HCT: 45.4 % (ref 39.0–52.0)
Lymphs Abs: 1.4 10*3/uL (ref 0.7–4.0)
MCH: 28.5 pg (ref 26.0–34.0)
MCHC: 33.5 g/dL (ref 30.0–36.0)
MCV: 85 fL (ref 78.0–100.0)
Monocytes Absolute: 1.1 10*3/uL — ABNORMAL HIGH (ref 0.1–1.0)
Monocytes Relative: 10 % (ref 3–12)
Neutro Abs: 8.2 10*3/uL — ABNORMAL HIGH (ref 1.7–7.7)
Platelets: 162 10*3/uL (ref 150–400)
RDW: 16.4 % — ABNORMAL HIGH (ref 11.5–15.5)
WBC: 10.7 10*3/uL — ABNORMAL HIGH (ref 4.0–10.5)

## 2012-02-26 LAB — BASIC METABOLIC PANEL
BUN: 41 mg/dL — ABNORMAL HIGH (ref 6–23)
CO2: 25 mEq/L (ref 19–32)
Calcium: 9.4 mg/dL (ref 8.4–10.5)
Chloride: 104 mEq/L (ref 96–112)
Creatinine, Ser: 1.5 mg/dL — ABNORMAL HIGH (ref 0.50–1.35)
GFR calc Af Amer: 46 mL/min — ABNORMAL LOW (ref >90)

## 2012-02-26 MED ORDER — TRAMADOL HCL 50 MG PO TABS: 50.0000 mg | ORAL_TABLET | Freq: Four times a day (QID) | ORAL | Status: AC | PRN

## 2012-02-26 MED ORDER — ONDANSETRON HCL 4 MG/2ML IJ SOLN
4.0000 mg | Freq: Once | INTRAMUSCULAR | Status: AC
  Administered 2012-02-26: 4 mg via INTRAVENOUS
  Filled 2012-02-26: qty 2

## 2012-02-26 MED ORDER — ONDANSETRON HCL 8 MG PO TABS: 8.0000 mg | ORAL_TABLET | Freq: Two times a day (BID) | ORAL | Status: AC | PRN

## 2012-02-26 MED ORDER — FENTANYL CITRATE 0.05 MG/ML IJ SOLN
50.0000 ug | Freq: Once | INTRAMUSCULAR | Status: AC
  Administered 2012-02-26: 50 ug via INTRAVENOUS
  Filled 2012-02-26: qty 2

## 2012-02-26 NOTE — ED Provider Notes (Signed)
History     CSN: [id number]  Arrival date & time 02/26/12  1308   First MD Initiated Contact with Patient 02/26/12 [phone]      Chief Complaint  Patient presents with  . Flank Pain    (Consider location/radiation/quality/duration/timing/severity/associated sxs/prior treatment) HPI [name] is an 78 yo male w pmh of paroxysmal A. Fib, COPD and osteoarthritis presenting with 1 day complaint of constant yet colicky L sided flank pain. Pt is accompanied by daughter who supplements the history. Pt reports that he had sharp pain over his L flank starting yesterday morning. The pain was initially relieved by tylenol but worsened throughout the day and tylenol no longer alleviated symptoms. The pain is 10/10, colicky in nature, pt unable to achieve position that is comfortable. Prior to coming to the ED this am, he took tramadol without relief. He says he felt slightly nauseated this morning. He says he has had some increased urinary frequency but denies dysuria, hesitancy, difficulty voiding or hematuria. He has h/o BPH and sometimes dribbles urine between voiding. He denies fever, chills or vomiting. He denies h/o nephrolithiasis or pyelonephritis.  Yesterday at his coagulation clinic visit he was noted to have supratherapeutic INR (3.6) and his warfarin dose was adjusted. He does report recent medical visit for evaluation of rash. On Monday, he was treated for urticarial reaction to suspected environmental trigger with a 6 day course of prednisone and hydroxyzine. Today is day 5 of prednisone treatment and rash has completely resolved. He has no other recent medications.  No CP, SOB, hematochezia, melena, diarrhea, weakness, numbness, confusion, dizziness. Last BM was 2 days ago.   Past Medical History  Diagnosis Date  . INSOMNIA, PERSISTENT   . DECREASED HEARING   . HYPERTENSION   . BRADYCARDIA   . C O P D   . RENAL INSUFFICIENCY   . BENIGN PROSTATIC HYPERTROPHY, WITH OBSTRUCTION   .  ASTEATOTIC ECZEMA   . LEG EDEMA, BILATERAL   . PAROXYSMAL ATRIAL FIBRILLATION   . Arthritis     knees, shoulders   . COPD (chronic obstructive pulmonary disease)     on chronic oxygen therapy.   . OA (osteoarthritis)   . Chronic anticoagulation   . Advanced age     Past Surgical History  Procedure Date  . Appendectomy     @ 23  . Tonsillectomy   . Hernia repair 01/2011    bilateral inguinal hernia   . Eye surgery     bilateral cataract surgery   . Other surgical history     left finger surgery   . Knee surgery right  . Total knee arthroplasty 10/26/2011    Procedure: TOTAL KNEE ARTHROPLASTY;  Surgeon: Loanne Drilling, MD;  Location: [facility];  Service: Orthopedics;  Laterality: Right;    Family History  Problem Relation Age of Onset  . Heart disease Father   . Heart attack Father   . Cancer Mother     colon   . Cirrhosis Sister     liver failure  . Heart attack Brother   . Diabetes Neg Hx     History  Substance Use Topics  . Smoking status: Former Smoker -- 7 years    Quit date: 07/21/2007  . Smokeless tobacco: Never Used   Comment: History of very heavy smoking.  Quite about 4 years ago.  . Alcohol Use: Yes     wine occasional       Review of Systems  Constitutional: Negative for  fever, chills and appetite change.  Respiratory: Negative for cough, chest tightness, shortness of breath and wheezing.   Cardiovascular: Negative for chest pain and palpitations.  Gastrointestinal: Positive for nausea. Negative for vomiting, diarrhea and blood in stool.  Genitourinary: Positive for frequency and flank pain. Negative for dysuria, urgency, scrotal swelling, difficulty urinating and testicular pain.  Musculoskeletal: Positive for back pain.  Neurological: Negative for dizziness, weakness, light-headedness and numbness.    Allergies  Review of patient's allergies indicates no known allergies.  Home Medications   Current Outpatient Rx  Name Route Sig Dispense  Refill  . AMLODIPINE BESYLATE 5 MG PO TABS  TAKE 1 TABLET BY MOUTH EVERY DAY 90 tablet 3  . ATENOLOL 25 MG PO TABS Oral Take 0.5 tablets (12.5 mg total) by mouth 2 (two) times daily. 90 tablet 3  . FINASTERIDE 5 MG PO TABS  TAKE 1 TABLET BY MOUTH EVERY DAY 30 tablet 11  . HYDROXYZINE HCL 10 MG PO TABS Oral Take 1 tablet (10 mg total) by mouth 3 (three) times daily as needed for itching. 30 tablet 0  . OMEPRAZOLE 20 MG PO CPDR  Take 1 by mouth daily    . PREDNISONE (PAK) 10 MG PO TABS Oral Take 1 tablet (10 mg total) by mouth as directed. As directed x 6 days 21 tablet 0  . TAMSULOSIN HCL 0.4 MG PO CAPS  TAKE ONE CAPSULE BY MOUTH TWICE A DAY 60 capsule 1  . TRIAMCINOLONE ACETONIDE 0.1 % EX CREA Topical Apply topically 2 (two) times daily. 30 g 0  . TRIAMCINOLONE ACETONIDE 0.1 % EX LOTN Topical Apply topically 3 (three) times daily. 240 mL 0  . WARFARIN SODIUM 3 MG PO TABS  Take as directed by the Anticoagulation Clinic. 110 tablet 1    BP 162/110  Pulse 58  Temp 97.7 F (36.5 C) (Oral)  Resp 20  SpO2 97%  Physical Exam  Constitutional: He is oriented to person, place, and time. He appears well-developed and well-nourished. No distress.  HENT:  Head: Normocephalic and atraumatic.  Eyes: Conjunctivae are normal. Pupils are equal, round, and reactive to light.  Cardiovascular: Normal rate, regular rhythm, normal heart sounds and intact distal pulses.  Exam reveals no gallop and no friction rub.   No murmur heard. Pulmonary/Chest: Breath sounds normal. No respiratory distress.  Abdominal: Soft. Bowel sounds are normal. He exhibits no distension. There is tenderness. There is no rebound and no guarding.       Slight TTP of L lower quadrant radiating down into inguinal region, no pain tracking into scrotum or testes.  Genitourinary:       L CVA tenderness with percussion and palpation.  Neurological: He is alert and oriented to person, place, and time. No cranial nerve deficit.  Skin: Skin  is warm and dry. No rash noted.    ED Course  Procedures (including critical care time)   Labs Reviewed  URINALYSIS, ROUTINE W REFLEX MICROSCOPIC   No results found.   No diagnosis found.    MDM  1. L sided flank pain Concern for nephrolithiasis vs pyelonephritis vs AAA vs retroperitoneal hemorrhage in light of supratherpeutic anticoagulation. Pt is hemodynamically stable. No LE weakness/numbness, no flank hemorrhage. No difficulty voiding or perineal pain to suggest prostatitis or urethral obstruction. Urine studies, Bmet, CBC pending. CT abdomen and pelvis pending. Fentanyl and Zofran for symptom relief.  Bronson Curb 02/26/2012 10:33 AM   CT abdomen/pelvis demonstrates punctate stone at the L ureterovesical junction.  Bmet shows evidence of AKI, likely prerenal w high BUN:Cr. Advised aggressive hydration at home and f/u w PCP in the next week. Pt and daughter expressed understanding. Will d/c w pain meds and antiemetics. [name] requests tramadol, as pt h/o sundowning on narcotics. Bronson Curb 02/26/2012 12:08 PM

## 2012-02-26 NOTE — ED Notes (Signed)
Left flank pain onset yesterday worse today with nausea

## 2012-02-26 NOTE — ED Provider Notes (Signed)
I saw and evaluated the patient, reviewed the resident's note and I agree with the findings and plan.  2 days constant yet colicky severe left flank pain with nausea radiates to LLQ, mild left CVAT, minimal LLQ tenderness.  10 Systems reviewed and are negative for acute change except as noted in the HPI.  Hurman Horn, MD 02/27/12 [phone]

## 2012-02-27 ENCOUNTER — Other Ambulatory Visit: Payer: Self-pay | Admitting: Internal Medicine

## 2012-03-09 ENCOUNTER — Ambulatory Visit (INDEPENDENT_AMBULATORY_CARE_PROVIDER_SITE_OTHER): Payer: Medicare Other | Admitting: Internal Medicine

## 2012-03-09 ENCOUNTER — Encounter: Payer: Self-pay | Admitting: Internal Medicine

## 2012-03-09 VITALS — BP 176/80 | HR 55 | Temp 97.6°F | Resp 16 | Wt 169.0 lb

## 2012-03-09 DIAGNOSIS — N401 Enlarged prostate with lower urinary tract symptoms: Secondary | ICD-10-CM

## 2012-03-09 DIAGNOSIS — Z87442 Personal history of urinary calculi: Secondary | ICD-10-CM | POA: Diagnosis not present

## 2012-03-09 MED ORDER — TAMSULOSIN HCL 0.4 MG PO CAPS: 0.4000 mg | ORAL_CAPSULE | Freq: Every day | ORAL | Status: DC

## 2012-03-09 MED ORDER — FINASTERIDE 5 MG PO TABS: 5.0000 mg | ORAL_TABLET | Freq: Every day | ORAL | Status: DC

## 2012-03-09 MED ORDER — MIRABEGRON ER 25 MG PO TB24: 25.0000 mg | ORAL_TABLET | Freq: Every day | ORAL | Status: DC

## 2012-03-09 NOTE — Assessment & Plan Note (Signed)
Had flank pain August 9th - seen at [facility] - spontaneous passage of stone. No recurrent symptoms.

## 2012-03-09 NOTE — Patient Instructions (Addendum)
Glad I was in to see you. Your knee looks great. Records reviewed from August 9th at [facility]: you had a kidney stone on the left. Fortunately you passed this on your own.  Enlarged prostate - you have been on finasteride for a long time along with the tamsulosin. You may not need so much tamsulosin at this time, thus try taking it only once a day.  Urinary frequency over the past several months suggests Irritable bladder aka OverActive Bladder (OAB).  Plan - a trial of Myrbetriq - a drug to help OAB. If it works we can call in an Rx for you.

## 2012-03-09 NOTE — Progress Notes (Signed)
Subjective:    Patient ID: Ricardo Perkins, male    DOB: Nov 03, 1922, 78 y.o.   MRN: [id number]  HPI Ricardo Perkins presents for follow up . Since he was seen last he has had a right TKR April; seens at [facility]  August 9, records reviewed,  for kidney stone which he passed.  He has done well since.  He was seen by Dr. Felicity Coyer August 7th-diagnosed with recurrent urticaria treated with depomedrol and prednisone dosepak. He has not had a recurrence since that visit.   He has a history of BPH on fenasteride and tamsulosin. Now with a 4 month h/o urgency and incontinence due to overflow.  Past Medical History  Diagnosis Date  . INSOMNIA, PERSISTENT   . DECREASED HEARING   . HYPERTENSION   . BRADYCARDIA   . C O P D   . RENAL INSUFFICIENCY   . BENIGN PROSTATIC HYPERTROPHY, WITH OBSTRUCTION   . ASTEATOTIC ECZEMA   . LEG EDEMA, BILATERAL   . PAROXYSMAL ATRIAL FIBRILLATION   . Arthritis     knees, shoulders   . COPD (chronic obstructive pulmonary disease)     on chronic oxygen therapy.   . OA (osteoarthritis)   . Chronic anticoagulation   . Advanced age    Past Surgical History  Procedure Date  . Appendectomy     @ 23  . Tonsillectomy   . Hernia repair 01/2011    bilateral inguinal hernia   . Eye surgery     bilateral cataract surgery   . Other surgical history     left finger surgery   . Knee surgery right  . Total knee arthroplasty 10/26/2011    Procedure: TOTAL KNEE ARTHROPLASTY;  Surgeon: Loanne Drilling, MD;  Location: [facility];  Service: Orthopedics;  Laterality: Right;   Family History  Problem Relation Age of Onset  . Heart disease Father   . Heart attack Father   . Cancer Mother     colon   . Cirrhosis Sister     liver failure  . Heart attack Brother   . Diabetes Neg Hx    History   Social History  . Marital Status: Single    Spouse Name: N/A    Number of Children: N/A  . Years of Education: N/A   Occupational History  . Not on file.   Social History Main  Topics  . Smoking status: Former Smoker -- 7 years    Quit date: 07/21/2007  . Smokeless tobacco: Never Used   Comment: History of very heavy smoking.  Quite about 4 years ago.  . Alcohol Use: Yes     wine occasional   . Drug Use: No  . Sexually Active: Not Currently   Other Topics Concern  . Not on file   Social History Narrative  . No narrative on file    Current Outpatient Prescriptions on File Prior to Visit  Medication Sig Dispense Refill  . amLODipine (NORVASC) 5 MG tablet TAKE 1 TABLET BY MOUTH EVERY DAY  90 tablet  3  . atenolol (TENORMIN) 25 MG tablet Take 0.5 tablets (12.5 mg total) by mouth 2 (two) times daily.  90 tablet  3  . finasteride (PROSCAR) 5 MG tablet TAKE 1 TABLET BY MOUTH EVERY DAY  30 tablet  11  . omeprazole (PRILOSEC) 20 MG capsule Take 1 by mouth daily      . Tamsulosin HCl (FLOMAX) 0.4 MG CAPS TAKE ONE CAPSULE BY MOUTH TWICE A DAY  60 capsule  0  . triamcinolone cream (KENALOG) 0.1 % Apply topically 2 (two) times daily.  30 g  0  . triamcinolone lotion (KENALOG) 0.1 % Apply topically 3 (three) times daily.  240 mL  0  . warfarin (COUMADIN) 3 MG tablet Take as directed by the Anticoagulation Clinic.  110 tablet  1      Review of Systems System review is negative for any constitutional, cardiac, pulmonary, GI or neuro symptoms or complaints other than as described in the HPI.     Objective:   Physical Exam Filed Vitals:   03/09/12 1321  BP: 176/80  Pulse: 55  Temp: 97.6 F (36.4 C)  Resp: 16   gen'l- elderly white man in no distress HEENT- abnormal appearing right eye - had blephroplasty with loss of eyelashes upper lid. Cor- 2+ radial RRR PUolm - CTAP MSK - s/p right TKR- well healed and now up to full activity. He is still exercising.         Assessment & Plan:  [name] - seen August 7th for urticaria - responded to treatment with steroids. No recurrence.

## 2012-03-09 NOTE — Assessment & Plan Note (Signed)
Long standing BPH on finasteride and tamsulosin. He is now having urgency, frequency and overflow incontinence. May have OAB in addition to BPH  Plan Continue finasteride  Reduce tamsulosin to once a day  Trial of Myrbetriq 25 mg once a day for OAB

## 2012-03-10 ENCOUNTER — Ambulatory Visit (INDEPENDENT_AMBULATORY_CARE_PROVIDER_SITE_OTHER): Payer: Medicare Other | Admitting: *Deleted

## 2012-03-10 DIAGNOSIS — I4891 Unspecified atrial fibrillation: Secondary | ICD-10-CM | POA: Diagnosis not present

## 2012-03-10 LAB — POCT INR: INR: 2.3

## 2012-03-15 ENCOUNTER — Telehealth: Payer: Self-pay | Admitting: *Deleted

## 2012-03-15 NOTE — Telephone Encounter (Signed)
Left msg on triage stating md gave samples of Myrbetriq & was told by md to let him know if med work & he will send rx to his pharmacy. [facility]. Would like call back to confirm rx was sent to pharmacy...Raechel Chute

## 2012-03-16 MED ORDER — MIRABEGRON ER 25 MG PO TB24: 25.0000 mg | ORAL_TABLET | Freq: Every day | ORAL | Status: DC

## 2012-03-16 NOTE — Telephone Encounter (Signed)
Notified daughter rx sent to pharmacy...Raechel Chute

## 2012-03-16 NOTE — Telephone Encounter (Signed)
Ok for [employer] 25 mg #30, sig 1 po qd, refill 11

## 2012-03-23 ENCOUNTER — Encounter: Payer: Self-pay | Admitting: General Practice

## 2012-03-31 ENCOUNTER — Ambulatory Visit (INDEPENDENT_AMBULATORY_CARE_PROVIDER_SITE_OTHER): Payer: Medicare Other | Admitting: *Deleted

## 2012-03-31 DIAGNOSIS — I4891 Unspecified atrial fibrillation: Secondary | ICD-10-CM | POA: Diagnosis not present

## 2012-04-06 DIAGNOSIS — Z23 Encounter for immunization: Secondary | ICD-10-CM | POA: Diagnosis not present

## 2012-04-07 DIAGNOSIS — M1710 Unilateral primary osteoarthritis, unspecified knee: Secondary | ICD-10-CM | POA: Diagnosis not present

## 2012-04-10 ENCOUNTER — Other Ambulatory Visit: Payer: Self-pay | Admitting: Internal Medicine

## 2012-04-11 ENCOUNTER — Other Ambulatory Visit: Payer: Self-pay | Admitting: General Practice

## 2012-04-11 MED ORDER — OMEPRAZOLE 20 MG PO CPDR: 20.0000 mg | DELAYED_RELEASE_CAPSULE | Freq: Every day | ORAL | Status: DC

## 2012-04-19 ENCOUNTER — Telehealth: Payer: Self-pay | Admitting: *Deleted

## 2012-04-19 NOTE — Telephone Encounter (Signed)
Pharmacy called for patient to request change of medication due to cost. Rx Myrbetriq 25 mg. Please advise

## 2012-04-19 NOTE — Telephone Encounter (Signed)
If myrbetrig worked we can try Vesicare 5 mg once a day, #30

## 2012-04-20 MED ORDER — SOLIFENACIN SUCCINATE 5 MG PO TABS: 5.0000 mg | ORAL_TABLET | Freq: Every day | ORAL | Status: DC

## 2012-04-20 NOTE — Telephone Encounter (Signed)
Patient daughter notified of change of medication sent to [facility] pharmacy. vesicare 5 mg tablet once a day . Patient daughter stated other med Beola Cord was $70.00 dollars. Will call back if any problems

## 2012-04-28 ENCOUNTER — Ambulatory Visit (INDEPENDENT_AMBULATORY_CARE_PROVIDER_SITE_OTHER): Payer: Medicare Other | Admitting: General Practice

## 2012-04-28 DIAGNOSIS — I4891 Unspecified atrial fibrillation: Secondary | ICD-10-CM | POA: Diagnosis not present

## 2012-04-28 LAB — POCT INR: INR: 2

## 2012-05-06 ENCOUNTER — Telehealth: Payer: Self-pay | Admitting: Cardiology

## 2012-05-06 MED ORDER — AMLODIPINE BESYLATE 5 MG PO TABS: 5.0000 mg | ORAL_TABLET | Freq: Every day | ORAL | Status: DC

## 2012-05-06 NOTE — Telephone Encounter (Signed)
Refill called in. Daughter, Chip Boer, is aware. Appt scheduled with Scott for 05/12/12. Follow-up increasing blood pressure. Pt does not want to see a NP.  They have seen Scott before for his surgical clearance. Mylo Red RN

## 2012-05-06 NOTE — Telephone Encounter (Signed)
New Problem:     Called in needing a refill of the patient's amLODipine (NORVASC) 5 MG tablet.

## 2012-05-06 NOTE — Telephone Encounter (Signed)
Ok to resume norvasc 5 mg po daily Ricardo Perkins

## 2012-05-06 NOTE — Telephone Encounter (Signed)
plz return call to pt daughter Chip Boer [phone] regarding pt med and elevated blood pressure.

## 2012-05-06 NOTE — Telephone Encounter (Signed)
Daughter calls today b/c pt blood pressure has been increasing in the afternoon for the past 2 weeks.  Averaging 190/92   No headaches. States pt has been feeling fine Morning blood pressures have averaged 135/78  Would like to know if he should restart his amlodipine.  I will forward this to Dr. [name] for review.

## 2012-05-12 ENCOUNTER — Encounter: Payer: Self-pay | Admitting: Physician Assistant

## 2012-05-12 ENCOUNTER — Ambulatory Visit (INDEPENDENT_AMBULATORY_CARE_PROVIDER_SITE_OTHER): Payer: Medicare Other | Admitting: Physician Assistant

## 2012-05-12 VITALS — BP 133/68 | HR 58 | Resp 18 | Ht 67.0 in | Wt 184.0 lb

## 2012-05-12 DIAGNOSIS — I4891 Unspecified atrial fibrillation: Secondary | ICD-10-CM | POA: Diagnosis not present

## 2012-05-12 DIAGNOSIS — R609 Edema, unspecified: Secondary | ICD-10-CM | POA: Diagnosis not present

## 2012-05-12 DIAGNOSIS — R0602 Shortness of breath: Secondary | ICD-10-CM | POA: Diagnosis not present

## 2012-05-12 DIAGNOSIS — I10 Essential (primary) hypertension: Secondary | ICD-10-CM | POA: Diagnosis not present

## 2012-05-12 LAB — BRAIN NATRIURETIC PEPTIDE: Pro B Natriuretic peptide (BNP): 92 pg/mL (ref 0.0–100.0)

## 2012-05-12 MED ORDER — FUROSEMIDE 20 MG PO TABS: 20.0000 mg | ORAL_TABLET | Freq: Every day | ORAL | Status: DC

## 2012-05-12 NOTE — Progress Notes (Signed)
[address] [place], [place]  [address] Phone: [phone], Fax:  [phone]  Date:  05/12/2012   Name:  Ricardo Perkins   DOB:  [birth year]   MRN:  [id number]  PCP:  Illene Regulus, MD  Primary Cardiologist:  Dr. Olga Millers  Primary Electrophysiologist:  None    History of Present Illness: Ricardo Perkins is a 78 y.o. male who returns for evaluation of his BP.  He has a hx of paroxysmal atrial fibrillation. His LV function is normal. He has also had a previous Myoview on November 10, 2007, that was interpreted as a possible small area of lateral ischemia at the apex, but Dr. Olga Millers reviewed this, and in fact, felt it was most likely normal.  Myoview 3/13 was again low risk with an EF of 62% with small reversible defect in the apex suggestive of very mild apical ischemia.  Patient underwent knee surgery several months ago. His blood pressures were running low around that time. His Norvasc was held. Over the last several weeks, his daughter, who is with him today, has noted that his blood pressures are increasing. His cuff at home has registered blood pressures from 140s to 160s systolic. He denies chest pain. He has chronic shortness of breath. This is unchanged. He denies orthopnea or PND. He has chronic LE edema. This may be somewhat worse. Of note, his edema is better in the mornings and worse in the evenings. He denies syncope. He occasionally gets lightheaded. He called the office and was asked to start his Norvasc.  Labs (5/13):   K 4, creatinine 1.2, ALT 13 Labs (8/13):   K 4.2, creatinine 1.5, Hgb 15.2   Wt Readings from Last 3 Encounters:  05/12/12 184 lb (83.462 kg)  03/09/12 169 lb (76.658 kg)  02/22/12 171 lb (77.565 kg)     Past Medical History  Diagnosis Date  . INSOMNIA, PERSISTENT   . DECREASED HEARING   . HYPERTENSION   . BRADYCARDIA     1st degree AVB  . RENAL INSUFFICIENCY   . BENIGN PROSTATIC HYPERTROPHY, WITH OBSTRUCTION   .  ASTEATOTIC ECZEMA   . LEG EDEMA, BILATERAL   . PAROXYSMAL ATRIAL FIBRILLATION     coumadin rx  . Arthritis     knees, shoulders   . COPD (chronic obstructive pulmonary disease)     on chronic oxygen therapy.   . Advanced age   . Hx of echocardiogram     a. echo in 2009 (in [place]): normal EF, mild LVH  . Hx of cardiovascular stress test     a.  MV 4/09:  possible small area of lateral ischemia at the apex, but Dr. Olga Millers reviewed this, and in fact, felt it was most likely normal.  b.  Myoview 3/13 was again low risk with an EF of 62% with small reversible defect in the apex suggestive of very mild apical ischemia.    Current Outpatient Prescriptions  Medication Sig Dispense Refill  . amLODipine (NORVASC) 5 MG tablet Take 1 tablet (5 mg total) by mouth daily.  90 tablet  3  . atenolol (TENORMIN) 25 MG tablet Take 0.5 tablets (12.5 mg total) by mouth 2 (two) times daily.  90 tablet  3  . finasteride (PROSCAR) 5 MG tablet Take 1 tablet (5 mg total) by mouth daily.  90 tablet  3  . omeprazole (PRILOSEC) 20 MG capsule Take 1 capsule (20 mg total) by mouth daily. Take 1  by mouth daily  30 capsule  3  . solifenacin (VESICARE) 5 MG tablet Take 1 tablet (5 mg total) by mouth daily.  30 tablet  6  . Tamsulosin HCl (FLOMAX) 0.4 MG CAPS Take 1 capsule (0.4 mg total) by mouth daily after supper.  90 capsule  3  . temazepam (RESTORIL) 30 MG capsule       . warfarin (COUMADIN) 3 MG tablet Take as directed by the Anticoagulation Clinic.  110 tablet  1  . mirabegron ER (MYRBETRIQ) 25 MG TB24 Take 1 tablet (25 mg total) by mouth daily.  30 tablet  11  . triamcinolone cream (KENALOG) 0.1 % Apply topically 2 (two) times daily.  30 g  0  . triamcinolone lotion (KENALOG) 0.1 % Apply topically 3 (three) times daily.  240 mL  0    Allergies: No Known Allergies  Social History:   reports that he quit smoking about 4 years ago. He has never used smokeless tobacco. He reports that he drinks alcohol.  He reports that he does not use illicit drugs.   ROS:  Please see the history of present illness.   No fevers, chills, cough, melena, hematochezia.   All other systems reviewed and negative.   PHYSICAL EXAM: VS:  BP 133/68  Pulse 58  Resp 18  Ht 5\' 7"  (1.702 m)  Wt 184 lb (83.462 kg)  BMI 28.82 kg/m2  SpO2 90% Well nourished, well developed, in no acute distress HEENT: normal Neck: Minimal JVD Cardiac:  normal S1, S2; RRR; no murmur Lungs:  clear to auscultation bilaterally, no wheezing, rhonchi or rales Abd: soft, nontender, no hepatomegaly Ext: 1+ bilateral ankle edema; multiple varicosities noted bilaterally Skin: warm and dry Neuro:  CNs 2-12 intact, no focal abnormalities noted  EKG:  Sinus bradycardia, HR 56, left axis deviation, inferior Q waves, poor R wave progression, PVCs, nonspecific ST-T wave changes, no change from prior tracings     ASSESSMENT AND PLAN:  1. Hypertension:   He just started back on his Norvasc. I checked his wrist cuff today. It is accurate with our reading here. Blood pressure by repeat today was 150/90. I have asked him to keep 9 his blood pressures at home. I will add Lasix 20 mg daily for his edema. This will also likely help his blood pressure. He will followup with me in 2 weeks.  2. Edema:   This is likely multifactorial. He did have some LVH on echocardiogram in 2009. I will repeat his echocardiogram now. I will check a basic metabolic panel, TSH and BNP. He has venous insufficiency.  I have asked him to wear compression socks. His Norvasc may be playing a role. However, he has tolerated this in the past. At this point, I prefer he remain on this medication. Start Lasix 20 mg daily. He has been asked to increase his dietary potassium. I will recheck a basic metabolic panel in one week. Followup as noted.  3. Paroxysmal Atrial Fibrillation:   He remains in sinus rhythm. He remains on Coumadin.  Luna Glasgow, PA-C  9:23 AM 05/12/2012

## 2012-05-12 NOTE — Patient Instructions (Addendum)
Your physician recommends that you schedule a follow-up appointment in: 2 WEEKS WITH SCOTT WEAVER, [facility]  Your physician has requested that you have an echocardiogram DX 786.05. Echocardiography is a painless test that uses sound waves to create images of your heart. It provides your doctor with information about the size and shape of your heart and how well your heart's chambers and valves are working. This procedure takes approximately one hour. There are no restrictions for this procedure.  START LASIX 20 MG  DAILY  WEAR COMPRESSION STOCKINGS DAILY  INCREASE POTASSIUM IN YOUR DIET

## 2012-05-13 LAB — BASIC METABOLIC PANEL
Calcium: 8.8 mg/dL (ref 8.4–10.5)
GFR: 53.52 mL/min — ABNORMAL LOW (ref >60.00)
Potassium: 4.4 mEq/L (ref 3.5–5.1)
Sodium: 140 mEq/L (ref 135–145)

## 2012-05-20 ENCOUNTER — Ambulatory Visit (HOSPITAL_COMMUNITY): Payer: Medicare Other | Attending: Cardiology | Admitting: Radiology

## 2012-05-20 ENCOUNTER — Telehealth: Payer: Self-pay | Admitting: *Deleted

## 2012-05-20 ENCOUNTER — Encounter: Payer: Self-pay | Admitting: Physician Assistant

## 2012-05-20 ENCOUNTER — Other Ambulatory Visit (INDEPENDENT_AMBULATORY_CARE_PROVIDER_SITE_OTHER): Payer: Medicare Other

## 2012-05-20 DIAGNOSIS — R0602 Shortness of breath: Secondary | ICD-10-CM

## 2012-05-20 DIAGNOSIS — R609 Edema, unspecified: Secondary | ICD-10-CM | POA: Insufficient documentation

## 2012-05-20 DIAGNOSIS — I10 Essential (primary) hypertension: Secondary | ICD-10-CM

## 2012-05-20 LAB — BASIC METABOLIC PANEL
Calcium: 8.8 mg/dL (ref 8.4–10.5)
GFR: 45.92 mL/min — ABNORMAL LOW (ref >60.00)
Potassium: 4 mEq/L (ref 3.5–5.1)
Sodium: 140 mEq/L (ref 135–145)

## 2012-05-20 NOTE — Telephone Encounter (Signed)
pt notified about echo and lab results w/verbal understanding today

## 2012-05-20 NOTE — Progress Notes (Signed)
Echocardiogram performed.  

## 2012-05-20 NOTE — Telephone Encounter (Signed)
Message copied by Tarri Fuller on Fri May 20, 2012  3:38 PM ------      Message from: [place], [place] T      Created: Fri May 20, 2012  1:37 PM       Normal EF.      Moderate LVH (Diast Dysfxn).  Also mild increased pressures in right heart.  -  Likely contributing some to edema.      Continue with current treatment plan.      Tereso Newcomer, PA-C  1:36 PM 05/20/2012

## 2012-05-26 ENCOUNTER — Encounter: Payer: Self-pay | Admitting: Physician Assistant

## 2012-05-26 ENCOUNTER — Ambulatory Visit (INDEPENDENT_AMBULATORY_CARE_PROVIDER_SITE_OTHER): Payer: Medicare Other | Admitting: Physician Assistant

## 2012-05-26 VITALS — BP 132/80 | HR 62 | Ht 67.0 in | Wt 176.8 lb

## 2012-05-26 DIAGNOSIS — I10 Essential (primary) hypertension: Secondary | ICD-10-CM | POA: Diagnosis not present

## 2012-05-26 DIAGNOSIS — R609 Edema, unspecified: Secondary | ICD-10-CM

## 2012-05-26 DIAGNOSIS — I4891 Unspecified atrial fibrillation: Secondary | ICD-10-CM | POA: Diagnosis not present

## 2012-05-26 NOTE — Patient Instructions (Addendum)
Your physician recommends that you continue on your current medications as directed. Please refer to the Current Medication list given to you today.  Your physician wants you to follow-up in: 6 months. You will receive a reminder letter in the mail two months in advance. If you don't receive a letter, please call our office to schedule the follow-up appointment.  

## 2012-05-26 NOTE — Progress Notes (Signed)
[address], [address] [place], [place]  [address] Phone: [phone], Fax:  [phone]  Date:  05/26/2012   Name:  Ricardo Perkins   DOB:  [birth year]   MRN:  [id number]  PCP:  Illene Regulus, MD  Primary Cardiologist:  Dr. Olga Millers  Primary Electrophysiologist:  None    History of Present Illness: Ricardo Perkins is a 78 y.o. male who returns for follow up.  He has a hx of paroxysmal AFib. His LV function is normal. He has also had a previous Myoview on November 10, 2007, that was interpreted as a possible small area of lateral ischemia at the apex, but Dr. Olga Millers reviewed this, and in fact, felt it was most likely normal.  Myoview 3/13 was again low risk with an EF of 62% with small reversible defect in the apex suggestive of very mild apical ischemia.  I saw him 10/24 after restarting his Amlodipine for persistently elevated BPs.  I continued his amlodipine at the current dose. I also added Lasix for LE edema.   Follow up echocardiogram 05/20/12 demonstrated moderate LVH with normal LV function and mild pulmonary hypertension.  Since I saw him, his edema is improved.  He is wearing compression stockings.  Denies chest pain, significant dyspnea, orthopnea, PND, syncope.   Labs (5/13):   K 4, creatinine 1.2, ALT 13 Labs (8/13):   K 4.2, creatinine 1.5, Hgb 15.2  Labs (10/13): K 4.4, creatinine 1.3, TSH 1.58, BNP 92 Labs (11/13): K 4, creatinine 1.5  Wt Readings from Last 3 Encounters:  05/26/12 176 lb 12.8 oz (80.196 kg)  05/12/12 184 lb (83.462 kg)  03/09/12 169 lb (76.658 kg)     Past Medical History  Diagnosis Date  . INSOMNIA, PERSISTENT   . DECREASED HEARING   . HYPERTENSION   . BRADYCARDIA     1st degree AVB  . RENAL INSUFFICIENCY   . BENIGN PROSTATIC HYPERTROPHY, WITH OBSTRUCTION   . ASTEATOTIC ECZEMA   . LEG EDEMA, BILATERAL   . PAROXYSMAL ATRIAL FIBRILLATION     coumadin rx  . Arthritis     knees, shoulders   . COPD (chronic obstructive  pulmonary disease)     on chronic oxygen therapy.   . Advanced age   . Hx of echocardiogram     a. echo in 2009 (in [place]): normal EF, mild LVH;   b. echo 10/13:  mid Ant HK, mod LVH, EF 55%, trivial AI, mod LAE, mild RVE, mild reduced RVSF, PASP 46  . Hx of cardiovascular stress test     a.  MV 4/09:  possible small area of lateral ischemia at the apex, but Dr. Olga Millers reviewed this, and in fact, felt it was most likely normal.  b.  Myoview 3/13 was again low risk with an EF of 62% with small reversible defect in the apex suggestive of very mild apical ischemia.    Current Outpatient Prescriptions  Medication Sig Dispense Refill  . amLODipine (NORVASC) 5 MG tablet Take 1 tablet (5 mg total) by mouth daily.  90 tablet  3  . atenolol (TENORMIN) 25 MG tablet Take 0.5 tablets (12.5 mg total) by mouth 2 (two) times daily.  90 tablet  3  . finasteride (PROSCAR) 5 MG tablet Take 1 tablet (5 mg total) by mouth daily.  90 tablet  3  . furosemide (LASIX) 20 MG tablet Take 1 tablet (20 mg total) by mouth daily.  30 tablet  11  .  mirabegron ER (MYRBETRIQ) 25 MG TB24 Take 1 tablet (25 mg total) by mouth daily.  30 tablet  11  . omeprazole (PRILOSEC) 20 MG capsule Take 1 capsule (20 mg total) by mouth daily. Take 1 by mouth daily  30 capsule  3  . solifenacin (VESICARE) 5 MG tablet Take 1 tablet (5 mg total) by mouth daily.  30 tablet  6  . Tamsulosin HCl (FLOMAX) 0.4 MG CAPS Take 1 capsule (0.4 mg total) by mouth daily after supper.  90 capsule  3  . temazepam (RESTORIL) 30 MG capsule Take 30 mg by mouth at bedtime as needed.       . warfarin (COUMADIN) 3 MG tablet Take as directed by the Anticoagulation Clinic.  110 tablet  1    Allergies:   No Known Allergies  Social History:  The patient  reports that he quit smoking about 4 years ago. He has never used smokeless tobacco. He reports that he drinks alcohol. He reports that he does not use illicit drugs.    PHYSICAL EXAM: VS:  BP 132/80   Pulse 62  Ht 5\' 7"  (1.702 m)  Wt 176 lb 12.8 oz (80.196 kg)  BMI 27.69 kg/m2 Well nourished, well developed, in no acute distress HEENT: normal Neck: no JVD Cardiac:  normal S1, S2; RRR; no murmur Lungs:  clear to auscultation bilaterally, no wheezing, rhonchi or rales Abd: soft, nontender, no hepatomegaly Ext: no edema Skin: warm and dry Neuro:  CNs 2-12 intact, no focal abnormalities noted   ASSESSMENT AND PLAN:  1. Hypertension:   Better controlled.  Continue current Rx.   2. Edema:   This is likely multifactorial. He does have LVH and likely has significant diastolic dysfunction.  He also has mild to mod pulmonary HTN.  He has a hx of COPD.  He only took the Lasix for 2 weeks.  I recommend he continue his compression stockings.  He can use Lasix 20 mg QD prn.  3. Paroxysmal Atrial Fibrillation:   He remains in sinus rhythm. He remains on Coumadin.  Follow up with Dr. Olga Millers in 6 mos.   Luna Glasgow, PA-C  10:29 AM 05/26/2012

## 2012-05-28 ENCOUNTER — Other Ambulatory Visit: Payer: Self-pay | Admitting: Internal Medicine

## 2012-05-31 NOTE — Telephone Encounter (Signed)
Refill on [facility]acation called [facility]

## 2012-06-09 ENCOUNTER — Ambulatory Visit (INDEPENDENT_AMBULATORY_CARE_PROVIDER_SITE_OTHER): Payer: Medicare Other | Admitting: General Practice

## 2012-06-09 DIAGNOSIS — I4891 Unspecified atrial fibrillation: Secondary | ICD-10-CM

## 2012-06-09 LAB — POCT INR: INR: 2.1

## 2012-06-17 ENCOUNTER — Other Ambulatory Visit: Payer: Self-pay | Admitting: Internal Medicine

## 2012-07-04 ENCOUNTER — Other Ambulatory Visit: Payer: Self-pay | Admitting: *Deleted

## 2012-07-04 MED ORDER — FINASTERIDE 5 MG PO TABS: 5.0000 mg | ORAL_TABLET | Freq: Every day | ORAL | Status: DC

## 2012-07-04 MED ORDER — OMEPRAZOLE 20 MG PO CPDR: 20.0000 mg | DELAYED_RELEASE_CAPSULE | Freq: Every day | ORAL | Status: DC

## 2012-07-22 ENCOUNTER — Ambulatory Visit (INDEPENDENT_AMBULATORY_CARE_PROVIDER_SITE_OTHER): Payer: Medicare Other | Admitting: General Practice

## 2012-07-22 DIAGNOSIS — I4891 Unspecified atrial fibrillation: Secondary | ICD-10-CM | POA: Diagnosis not present

## 2012-07-22 LAB — POCT INR: INR: 2

## 2012-08-03 ENCOUNTER — Other Ambulatory Visit: Payer: Self-pay | Admitting: *Deleted

## 2012-08-03 MED ORDER — OMEPRAZOLE 20 MG PO CPDR: 20.0000 mg | DELAYED_RELEASE_CAPSULE | Freq: Every day | ORAL | Status: DC

## 2012-08-04 ENCOUNTER — Other Ambulatory Visit: Payer: Self-pay | Admitting: *Deleted

## 2012-08-04 MED ORDER — OMEPRAZOLE 20 MG PO CPDR: 20.0000 mg | DELAYED_RELEASE_CAPSULE | Freq: Every day | ORAL | Status: DC

## 2012-08-08 DIAGNOSIS — H16109 Unspecified superficial keratitis, unspecified eye: Secondary | ICD-10-CM | POA: Diagnosis not present

## 2012-08-24 ENCOUNTER — Other Ambulatory Visit: Payer: Self-pay | Admitting: Cardiology

## 2012-08-25 DIAGNOSIS — H02109 Unspecified ectropion of unspecified eye, unspecified eyelid: Secondary | ICD-10-CM | POA: Diagnosis not present

## 2012-08-25 DIAGNOSIS — H16109 Unspecified superficial keratitis, unspecified eye: Secondary | ICD-10-CM | POA: Diagnosis not present

## 2012-08-29 DIAGNOSIS — H04529 Eversion of unspecified lacrimal punctum: Secondary | ICD-10-CM | POA: Diagnosis not present

## 2012-08-29 DIAGNOSIS — H11439 Conjunctival hyperemia, unspecified eye: Secondary | ICD-10-CM | POA: Diagnosis not present

## 2012-08-29 DIAGNOSIS — H02839 Dermatochalasis of unspecified eye, unspecified eyelid: Secondary | ICD-10-CM | POA: Diagnosis not present

## 2012-08-29 DIAGNOSIS — H02439 Paralytic ptosis unspecified eyelid: Secondary | ICD-10-CM | POA: Diagnosis not present

## 2012-08-29 DIAGNOSIS — H02139 Senile ectropion of unspecified eye, unspecified eyelid: Secondary | ICD-10-CM | POA: Diagnosis not present

## 2012-09-05 ENCOUNTER — Telehealth: Payer: Self-pay | Admitting: Cardiology

## 2012-09-05 ENCOUNTER — Ambulatory Visit (INDEPENDENT_AMBULATORY_CARE_PROVIDER_SITE_OTHER): Payer: Medicare Other | Admitting: Physician Assistant

## 2012-09-05 ENCOUNTER — Encounter: Payer: Self-pay | Admitting: Physician Assistant

## 2012-09-05 VITALS — BP 108/70 | HR 55 | Ht 67.0 in | Wt 190.8 lb

## 2012-09-05 DIAGNOSIS — I4891 Unspecified atrial fibrillation: Secondary | ICD-10-CM

## 2012-09-05 DIAGNOSIS — I10 Essential (primary) hypertension: Secondary | ICD-10-CM | POA: Diagnosis not present

## 2012-09-05 DIAGNOSIS — Z01810 Encounter for preprocedural cardiovascular examination: Secondary | ICD-10-CM

## 2012-09-05 NOTE — Progress Notes (Signed)
[address] [place], [place]  [address] Phone: [phone], Fax:  [phone]  Date:  09/05/2012   ID:  Ricardo Perkins, DOB Jan 31, 1923, MRN [id number]  PCP:  Illene Regulus, MD  Primary Cardiologist:  Dr. Olga Millers     History of Present Illness: Ricardo Perkins is a 79 y.o. male who returns for surgical clearance.  He needs surgery on his right eyelid for ectropion. Surgeon is Dr. Martyn Malay at [facility] (main office in [place]).  He has a hx of paroxysmal AFib. His LV function is normal. He has also had a previous Myoview on November 10, 2007, that was interpreted as a possible small area of lateral ischemia at the apex, but Dr. Olga Millers reviewed this, and in fact, felt it was most likely normal. Myoview 3/13 was again low risk with an EF of 62% with small reversible defect in the apex suggestive of very mild apical ischemia.  Echocardiogram 05/20/12 demonstrated moderate LVH with normal LV function and mild pulmonary hypertension.  Since last seen, he is doing well.  No chest pain, syncope, significant dyspnea.  He is NYHA Class II-IIb.  No orthopnea, PND.  LE edema stable.  No syncope.    Labs (5/13):   K 4, creatinine 1.2, ALT 13  Labs (8/13):   K 4.2, creatinine 1.5, Hgb 15.2  Labs (10/13): K 4.4, creatinine 1.3, TSH 1.58, BNP 92  Labs (11/13): K 4, creatinine 1.5   Wt Readings from Last 3 Encounters:  05/26/12 176 lb 12.8 oz (80.196 kg)  05/12/12 184 lb (83.462 kg)  03/09/12 169 lb (76.658 kg)     Past Medical History  Diagnosis Date  . INSOMNIA, PERSISTENT   . DECREASED HEARING   . HYPERTENSION   . BRADYCARDIA     1st degree AVB  . RENAL INSUFFICIENCY   . BENIGN PROSTATIC HYPERTROPHY, WITH OBSTRUCTION   . ASTEATOTIC ECZEMA   . LEG EDEMA, BILATERAL   . PAROXYSMAL ATRIAL FIBRILLATION     coumadin rx  . Arthritis     knees, shoulders   . COPD (chronic obstructive pulmonary disease)     on chronic oxygen therapy.   .  Advanced age   . Hx of echocardiogram     a. echo in 2009 (in [place]): normal EF, mild LVH;   b. echo 10/13:  mid Ant HK, mod LVH, EF 55%, trivial AI, mod LAE, mild RVE, mild reduced RVSF, PASP 46  . Hx of cardiovascular stress test     a.  MV 4/09:  possible small area of lateral ischemia at the apex, but Dr. Olga Millers reviewed this, and in fact, felt it was most likely normal.  b.  Myoview 3/13 was again low risk with an EF of 62% with small reversible defect in the apex suggestive of very mild apical ischemia.    Current Outpatient Prescriptions  Medication Sig Dispense Refill  . amLODipine (NORVASC) 5 MG tablet Take 1 tablet (5 mg total) by mouth daily.  90 tablet  3  . atenolol (TENORMIN) 25 MG tablet Take 0.5 tablets (12.5 mg total) by mouth 2 (two) times daily.  90 tablet  3  . finasteride (PROSCAR) 5 MG tablet Take 1 tablet (5 mg total) by mouth daily.  90 tablet  3  . furosemide (LASIX) 20 MG tablet Take 20 mg by mouth as needed (if legs or ankles are swollen).      Marland Kitchen omeprazole (PRILOSEC) 20 MG  capsule Take 1 capsule (20 mg total) by mouth daily.  30 capsule  1  . Tamsulosin HCl (FLOMAX) 0.4 MG CAPS Take 1 capsule (0.4 mg total) by mouth daily after supper.  90 capsule  3  . temazepam (RESTORIL) 30 MG capsule TAKE ONE CAPSULE BY MOUTH AT BEDTIME  30 capsule  5  . warfarin (COUMADIN) 3 MG tablet TAKE AS DIRECTED BY THE ANTICOAGULATION CLINIC  110 tablet  1   No current facility-administered medications for this visit.    Allergies:   No Known Allergies  Social History:  The patient  reports that he quit smoking about 5 years ago. He has never used smokeless tobacco. He reports that  drinks alcohol. He reports that he does not use illicit drugs.   ROS:  Please see the history of present illness.   He has trouble with insomnia.  All other systems reviewed and negative.   PHYSICAL EXAM: VS:  BP 108/70  Pulse 55  Ht 5\' 7"  (1.702 m)  Wt 190 lb 12.8 oz (86.546 kg)  BMI 29.88  kg/m2 Well nourished, well developed, in no acute distress HEENT: normal Neck: no JVD Cardiac:  normal S1, S2; RRR; no murmur Lungs:  clear to auscultation bilaterally, no wheezing, rhonchi or rales Abd: soft, nontender, no hepatomegaly Ext: no edema Skin: warm and dry Neuro:  CNs 2-12 intact, no focal abnormalities noted  EKG:  Sinus brady, HR 55, PAC/PVCs, PRWP, no change from prior tracing     ASSESSMENT AND PLAN:  1. Surgical Clearance:  He does not have any unstable cardiac conditions.  He can achieve at least 4 METs without symptoms of angina.  He remains in NSR.  He denies any hx of stroke.  His procedure is to be done under conscious sedation according to the patient.  He does not require any further cardiac workup.  He should be at acceptable risk.  He may stop coumadin 5 days prior to surgery and resume post-op when felt to be safe.   2. Atrial Fibrillation:  Remains in NSR.  Continue coumadin.   3. Hypertension:  Controlled. 4. Insomnia:  Recommend he follow up with PCP for evaluation and management. 5. Disposition:  Follow up with Dr. Olga Millers as planned.   Signed, Tereso Newcomer, PA-C  2:12 PM 09/05/2012

## 2012-09-05 NOTE — Telephone Encounter (Signed)
Will forward to [employer].

## 2012-09-05 NOTE — Patient Instructions (Addendum)
NO CHANGES WERE MADE TODAY  KEEP YOUR FOLLOW UP APPT WITH DR. CRENSHAW IN MAY 2014

## 2012-09-05 NOTE — Telephone Encounter (Signed)
Attempted to call pt. sound BUSY.

## 2012-09-05 NOTE — Telephone Encounter (Signed)
Follow Up   Following up on medical clearance form faxed over last week.

## 2012-09-06 ENCOUNTER — Telehealth: Payer: Self-pay | Admitting: Cardiology

## 2012-09-06 NOTE — Telephone Encounter (Signed)
Office note containing clearance to be faxed

## 2012-09-06 NOTE — Telephone Encounter (Signed)
Office note containing clearance faxed to [phone]

## 2012-09-06 NOTE — Telephone Encounter (Signed)
Dr Clelia Croft needs surgical clearance form she faxed returned to them asap, pt had ov yesterday for the clearance with scott

## 2012-09-12 ENCOUNTER — Other Ambulatory Visit: Payer: Self-pay

## 2012-09-12 DIAGNOSIS — C44390 Other specified malignant neoplasm of skin of unspecified parts of face: Secondary | ICD-10-CM | POA: Diagnosis not present

## 2012-09-12 DIAGNOSIS — H16219 Exposure keratoconjunctivitis, unspecified eye: Secondary | ICD-10-CM | POA: Diagnosis not present

## 2012-09-12 DIAGNOSIS — D2330 Other benign neoplasm of skin of unspecified part of face: Secondary | ICD-10-CM | POA: Diagnosis not present

## 2012-09-12 DIAGNOSIS — M2420 Disorder of ligament, unspecified site: Secondary | ICD-10-CM | POA: Diagnosis not present

## 2012-09-12 DIAGNOSIS — H02139 Senile ectropion of unspecified eye, unspecified eyelid: Secondary | ICD-10-CM | POA: Diagnosis not present

## 2012-09-12 DIAGNOSIS — L739 Follicular disorder, unspecified: Secondary | ICD-10-CM | POA: Diagnosis not present

## 2012-09-12 DIAGNOSIS — L905 Scar conditions and fibrosis of skin: Secondary | ICD-10-CM | POA: Diagnosis not present

## 2012-09-13 ENCOUNTER — Telehealth: Payer: Self-pay | Admitting: Internal Medicine

## 2012-09-13 MED ORDER — OMEPRAZOLE 20 MG PO CPDR: 20.0000 mg | DELAYED_RELEASE_CAPSULE | Freq: Every day | ORAL | Status: DC

## 2012-09-13 NOTE — Telephone Encounter (Signed)
Pt req refill for Omeprazole 20mg  to be send to [facility] on [address] in [place] [phone]. Please update in the system on pharmacy as well, [facility]. Pt is out of this med, Please call pt if this is ok.

## 2012-09-27 ENCOUNTER — Other Ambulatory Visit: Payer: Self-pay | Admitting: General Practice

## 2012-09-27 ENCOUNTER — Ambulatory Visit (INDEPENDENT_AMBULATORY_CARE_PROVIDER_SITE_OTHER): Payer: Medicare Other | Admitting: General Practice

## 2012-09-27 DIAGNOSIS — I4891 Unspecified atrial fibrillation: Secondary | ICD-10-CM

## 2012-09-27 LAB — POCT INR: INR: 1.9

## 2012-10-10 ENCOUNTER — Other Ambulatory Visit: Payer: Self-pay | Admitting: Internal Medicine

## 2012-10-10 ENCOUNTER — Telehealth: Payer: Self-pay | Admitting: Internal Medicine

## 2012-10-10 NOTE — Telephone Encounter (Signed)
Daughter/Vickie calling and states patient had medications transferred to [facility] in November. Insurance changed in January and can no longer get them at [facility]. [facility] cannot be done without office  Sending in the scripts as "the law will not allow more than 1 transfer". [facility]/[phone].Marland Kitchen Also states he is out of Tamazepam and pharmacy has sent request but has not been filled. Per [facility], Tamazepam 30mg  at bedtime #30 5 refills [facility]/[phone]. Patient also requests someone call her when [facility]. [phone]

## 2012-10-10 NOTE — Telephone Encounter (Signed)
Temazepam request [Last Rx 11.09.13 #30x5]/[facility] Please advise.

## 2012-10-11 MED ORDER — ATENOLOL 25 MG PO TABS: 12.5000 mg | ORAL_TABLET | Freq: Two times a day (BID) | ORAL | Status: DC

## 2012-10-11 MED ORDER — AMLODIPINE BESYLATE 5 MG PO TABS: 5.0000 mg | ORAL_TABLET | Freq: Every day | ORAL | Status: DC

## 2012-10-11 MED ORDER — WARFARIN SODIUM 3 MG PO TABS: 3.0000 mg | ORAL_TABLET | Freq: Every day | ORAL | Status: DC

## 2012-10-11 MED ORDER — TAMSULOSIN HCL 0.4 MG PO CAPS: 0.4000 mg | ORAL_CAPSULE | Freq: Every day | ORAL | Status: DC

## 2012-10-11 MED ORDER — FINASTERIDE 5 MG PO TABS: 5.0000 mg | ORAL_TABLET | Freq: Every day | ORAL | Status: DC

## 2012-10-11 MED ORDER — OMEPRAZOLE 20 MG PO CPDR: DELAYED_RELEASE_CAPSULE | ORAL | Status: DC

## 2012-10-11 NOTE — Telephone Encounter (Signed)
All medications, except Vesicare and Lasix [facility]. Vickie(daughter) states he no longer takes Vesicare or Lasix.

## 2012-10-14 DIAGNOSIS — J449 Chronic obstructive pulmonary disease, unspecified: Secondary | ICD-10-CM

## 2012-10-14 DIAGNOSIS — J438 Other emphysema: Secondary | ICD-10-CM | POA: Diagnosis not present

## 2012-10-25 ENCOUNTER — Ambulatory Visit (INDEPENDENT_AMBULATORY_CARE_PROVIDER_SITE_OTHER): Payer: Medicare Other | Admitting: General Practice

## 2012-10-25 DIAGNOSIS — I4891 Unspecified atrial fibrillation: Secondary | ICD-10-CM

## 2012-10-25 LAB — POCT INR: INR: 2.5

## 2012-11-21 DIAGNOSIS — R339 Retention of urine, unspecified: Secondary | ICD-10-CM | POA: Diagnosis not present

## 2012-11-21 DIAGNOSIS — N402 Nodular prostate without lower urinary tract symptoms: Secondary | ICD-10-CM | POA: Diagnosis not present

## 2012-11-21 DIAGNOSIS — N401 Enlarged prostate with lower urinary tract symptoms: Secondary | ICD-10-CM | POA: Diagnosis not present

## 2012-11-22 ENCOUNTER — Ambulatory Visit (INDEPENDENT_AMBULATORY_CARE_PROVIDER_SITE_OTHER): Payer: Medicare Other | Admitting: General Practice

## 2012-11-22 DIAGNOSIS — I4891 Unspecified atrial fibrillation: Secondary | ICD-10-CM | POA: Diagnosis not present

## 2012-11-22 LAB — POCT INR: INR: 2.5

## 2012-11-22 NOTE — Patient Instructions (Addendum)
5/14 - Take last dose of coumadin until after surgery.  5/19 - surgery  5/20 - Take 1 1/2 tablets of coumadin 5/21 - Take 1 1/2 tablets of coumadin 5/22 - Take 1 tablet of coumadin 5/23 - Take 1 tablet of coumadin 5/24 - Take 1 tablet of coumadin 5/25 - Take 1 tablet of coumadin 5/26 - Take 1 tablet of coumadin 5/27 - Re-check in clinic.

## 2012-11-30 ENCOUNTER — Ambulatory Visit (INDEPENDENT_AMBULATORY_CARE_PROVIDER_SITE_OTHER): Payer: Medicare Other | Admitting: Cardiology

## 2012-11-30 ENCOUNTER — Other Ambulatory Visit: Payer: Self-pay | Admitting: *Deleted

## 2012-11-30 ENCOUNTER — Encounter: Payer: Self-pay | Admitting: Cardiology

## 2012-11-30 VITALS — BP 122/78 | HR 62 | Wt 188.0 lb

## 2012-11-30 DIAGNOSIS — N259 Disorder resulting from impaired renal tubular function, unspecified: Secondary | ICD-10-CM | POA: Diagnosis not present

## 2012-11-30 DIAGNOSIS — I10 Essential (primary) hypertension: Secondary | ICD-10-CM | POA: Diagnosis not present

## 2012-11-30 DIAGNOSIS — E875 Hyperkalemia: Secondary | ICD-10-CM

## 2012-11-30 DIAGNOSIS — I4891 Unspecified atrial fibrillation: Secondary | ICD-10-CM | POA: Diagnosis not present

## 2012-11-30 LAB — BASIC METABOLIC PANEL
Calcium: 9.3 mg/dL (ref 8.4–10.5)
GFR: 42.04 mL/min — ABNORMAL LOW (ref >60.00)
Sodium: 138 mEq/L (ref 135–145)

## 2012-11-30 LAB — CBC WITH DIFFERENTIAL/PLATELET
Basophils Absolute: 0 10*3/uL (ref 0.0–0.1)
Basophils Relative: 0.4 % (ref 0.0–3.0)
Eosinophils Relative: 2 % (ref 0.0–5.0)
Hemoglobin: 15.6 g/dL (ref 13.0–17.0)
Lymphocytes Relative: 35.5 % (ref 12.0–46.0)
Monocytes Relative: 8.4 % (ref 3.0–12.0)
Neutro Abs: 3.4 10*3/uL (ref 1.4–7.7)
RBC: 5.48 Mil/uL (ref 4.22–5.81)
RDW: 16.5 % — ABNORMAL HIGH (ref 11.5–14.6)
WBC: 6.3 10*3/uL (ref 4.5–10.5)

## 2012-11-30 NOTE — Patient Instructions (Addendum)
Your physician wants you to follow-up in: ONE YEAR WITH DR CRENSHAW You will receive a reminder letter in the mail two months in advance. If you don't receive a letter, please call our office to schedule the follow-up appointment.   Your physician recommends that you HAVE LAB WORK TODAY 

## 2012-11-30 NOTE — Assessment & Plan Note (Signed)
Continue present blood pressure medications. 

## 2012-11-30 NOTE — Assessment & Plan Note (Signed)
Check potassium and renal function. 

## 2012-11-30 NOTE — Progress Notes (Signed)
HPI: Pleasant male for fu of atrial fibrillation. Myoview 3/13 was low risk with an EF of 62% with small reversible defect in the apex suggestive of very mild apical ischemia. Echocardiogram 05/20/12 demonstrated moderate LVH with normal LV function and mild pulmonary hypertension. Since last seen, the patient denies any dyspnea on exertion, orthopnea, PND, pedal edema, palpitations, syncope or chest pain. No falls.  Current Outpatient Prescriptions  Medication Sig Dispense Refill  . amLODipine (NORVASC) 5 MG tablet Take 1 tablet (5 mg total) by mouth daily.  90 tablet  3  . atenolol (TENORMIN) 25 MG tablet Take 0.5 tablets (12.5 mg total) by mouth 2 (two) times daily.  90 tablet  3  . finasteride (PROSCAR) 5 MG tablet Take 1 tablet (5 mg total) by mouth daily.  90 tablet  3  . omeprazole (PRILOSEC) 20 MG capsule Takes 1 tablet daily prn swelling  30 capsule  5  . tamsulosin (FLOMAX) 0.4 MG CAPS Take 1 capsule (0.4 mg total) by mouth daily after supper.  90 capsule  3  . temazepam (RESTORIL) 30 MG capsule TAKE 1 TABLET BY MOUTH AT BEDTIME  30 capsule  5  . warfarin (COUMADIN) 3 MG tablet Take 1 tablet (3 mg total) by mouth daily.  110 tablet  1   No current facility-administered medications for this visit.     Past Medical History  Diagnosis Date  . INSOMNIA, PERSISTENT   . DECREASED HEARING   . HYPERTENSION   . BRADYCARDIA     1st degree AVB  . RENAL INSUFFICIENCY   . BENIGN PROSTATIC HYPERTROPHY, WITH OBSTRUCTION   . ASTEATOTIC ECZEMA   . LEG EDEMA, BILATERAL   . PAROXYSMAL ATRIAL FIBRILLATION     coumadin rx  . Arthritis     knees, shoulders   . COPD (chronic obstructive pulmonary disease)     on chronic oxygen therapy.   . Advanced age   . Hx of echocardiogram     a. echo in 2009 (in [place]): normal EF, mild LVH;   b. echo 10/13:  mid Ant HK, mod LVH, EF 55%, trivial AI, mod LAE, mild RVE, mild reduced RVSF, PASP 46  . Hx of cardiovascular stress test     a.  MV 4/09:   possible small area of lateral ischemia at the apex, but Dr. Olga Millers reviewed this, and in fact, felt it was most likely normal.  b.  Myoview 3/13 was again low risk with an EF of 62% with small reversible defect in the apex suggestive of very mild apical ischemia.    Past Surgical History  Procedure Laterality Date  . Appendectomy      @ 23  . Tonsillectomy    . Hernia repair  01/2011    bilateral inguinal hernia   . Eye surgery      bilateral cataract surgery   . Other surgical history      left finger surgery   . Knee surgery  right  . Total knee arthroplasty  10/26/2011    Procedure: TOTAL KNEE ARTHROPLASTY;  Surgeon: Loanne Drilling, MD;  Location: [facility];  Service: Orthopedics;  Laterality: Right;    History   Social History  . Marital Status: Single    Spouse Name: N/A    Number of Children: N/A  . Years of Education: N/A   Occupational History  . Not on file.   Social History Main Topics  . Smoking status: Former Smoker -- 7 years  Quit date: 07/21/2007  . Smokeless tobacco: Never Used     Comment: History of very heavy smoking.  Quite about 4 years ago.  . Alcohol Use: Yes     Comment: wine occasional   . Drug Use: No  . Sexually Active: Not Currently   Other Topics Concern  . Not on file   Social History Narrative  . No narrative on file    ROS: no fevers or chills, productive cough, hemoptysis, dysphasia, odynophagia, melena, hematochezia, dysuria, hematuria, rash, seizure activity, orthopnea, PND, pedal edema, claudication. Remaining systems are negative.  Physical Exam: Well-developed well-nourished in no acute distress.  Skin is warm and dry.  HEENT is normal.  Neck is supple.  Chest is clear to auscultation with normal expansion.  Cardiovascular exam is regular rate and rhythm.  Abdominal exam nontender or distended. No masses palpated. Extremities show no edema. neuro grossly intact  ECG sinus rhythm with first degree AV block. Left  ventricular hypertrophy, anterior infarct, inferior infarct. No change compared to previous.

## 2012-11-30 NOTE — Assessment & Plan Note (Addendum)
Patient remains in sinus rhythm. Continue Coumadin. Continue atenolol. Patient has fallen in the past but none in the past year and this does not appear to be an issue. Check hemoglobin.

## 2012-12-01 ENCOUNTER — Other Ambulatory Visit (INDEPENDENT_AMBULATORY_CARE_PROVIDER_SITE_OTHER): Payer: Medicare Other

## 2012-12-01 ENCOUNTER — Ambulatory Visit: Payer: Medicare Other | Admitting: Cardiology

## 2012-12-01 DIAGNOSIS — E875 Hyperkalemia: Secondary | ICD-10-CM | POA: Diagnosis not present

## 2012-12-01 LAB — BASIC METABOLIC PANEL
CO2: 24 mEq/L (ref 19–32)
Chloride: 105 mEq/L (ref 96–112)
Sodium: 137 mEq/L (ref 135–145)

## 2012-12-02 ENCOUNTER — Telehealth: Payer: Self-pay | Admitting: Cardiology

## 2012-12-02 NOTE — Telephone Encounter (Signed)
New problem    pts daughter wants to know if she can stop by and pick up lab results from yesterday

## 2012-12-02 NOTE — Telephone Encounter (Signed)
Copy of labs given to pt dtr.

## 2012-12-02 NOTE — Telephone Encounter (Signed)
Faxed documentation of surgical clearance to [facility] at [phone].

## 2012-12-05 DIAGNOSIS — H53459 Other localized visual field defect, unspecified eye: Secondary | ICD-10-CM | POA: Diagnosis not present

## 2012-12-05 DIAGNOSIS — L918 Other hypertrophic disorders of the skin: Secondary | ICD-10-CM | POA: Diagnosis not present

## 2012-12-05 DIAGNOSIS — H02839 Dermatochalasis of unspecified eye, unspecified eyelid: Secondary | ICD-10-CM | POA: Diagnosis not present

## 2012-12-05 DIAGNOSIS — H02409 Unspecified ptosis of unspecified eyelid: Secondary | ICD-10-CM | POA: Diagnosis not present

## 2012-12-13 ENCOUNTER — Other Ambulatory Visit: Payer: Self-pay

## 2012-12-13 ENCOUNTER — Ambulatory Visit (INDEPENDENT_AMBULATORY_CARE_PROVIDER_SITE_OTHER): Payer: Medicare Other | Admitting: Family Medicine

## 2012-12-13 DIAGNOSIS — I4891 Unspecified atrial fibrillation: Secondary | ICD-10-CM | POA: Diagnosis not present

## 2012-12-13 LAB — POCT INR: INR: 1.7

## 2012-12-13 MED ORDER — OMEPRAZOLE 20 MG PO CPDR: DELAYED_RELEASE_CAPSULE | ORAL | Status: DC

## 2013-01-10 ENCOUNTER — Ambulatory Visit (INDEPENDENT_AMBULATORY_CARE_PROVIDER_SITE_OTHER): Payer: Medicare Other | Admitting: General Practice

## 2013-01-10 DIAGNOSIS — Z7901 Long term (current) use of anticoagulants: Secondary | ICD-10-CM | POA: Diagnosis not present

## 2013-01-10 DIAGNOSIS — I4891 Unspecified atrial fibrillation: Secondary | ICD-10-CM

## 2013-01-10 LAB — POCT INR: INR: 2.3

## 2013-02-07 ENCOUNTER — Ambulatory Visit (INDEPENDENT_AMBULATORY_CARE_PROVIDER_SITE_OTHER): Payer: Medicare Other | Admitting: General Practice

## 2013-02-07 DIAGNOSIS — I4891 Unspecified atrial fibrillation: Secondary | ICD-10-CM | POA: Diagnosis not present

## 2013-02-07 LAB — POCT INR: INR: 2.7

## 2013-02-16 DIAGNOSIS — M722 Plantar fascial fibromatosis: Secondary | ICD-10-CM | POA: Diagnosis not present

## 2013-03-07 ENCOUNTER — Other Ambulatory Visit: Payer: Self-pay | Admitting: Internal Medicine

## 2013-03-07 ENCOUNTER — Ambulatory Visit (INDEPENDENT_AMBULATORY_CARE_PROVIDER_SITE_OTHER): Payer: Medicare Other | Admitting: Family Medicine

## 2013-03-07 DIAGNOSIS — I4891 Unspecified atrial fibrillation: Secondary | ICD-10-CM

## 2013-03-22 ENCOUNTER — Telehealth: Payer: Self-pay | Admitting: *Deleted

## 2013-03-22 ENCOUNTER — Other Ambulatory Visit: Payer: Self-pay | Admitting: Physician Assistant

## 2013-03-22 ENCOUNTER — Other Ambulatory Visit: Payer: Self-pay | Admitting: *Deleted

## 2013-03-22 MED ORDER — FUROSEMIDE 20 MG PO TABS: ORAL_TABLET | ORAL | Status: DC

## 2013-03-22 NOTE — Telephone Encounter (Signed)
Patients daughter, Chip Boer, called back again requesting lasix since it was given to him in October by Tereso Newcomer PA. Per Deliah Goody, ok to fill.

## 2013-03-22 NOTE — Telephone Encounter (Signed)
Spoke with pt, he only takes lasix as needed. Advised the pt it is not on his med list with [place]. He is going to call dr Debby Bud about being seen to get his refill.

## 2013-04-04 ENCOUNTER — Ambulatory Visit (INDEPENDENT_AMBULATORY_CARE_PROVIDER_SITE_OTHER): Payer: Medicare Other | Admitting: General Practice

## 2013-04-04 DIAGNOSIS — I4891 Unspecified atrial fibrillation: Secondary | ICD-10-CM | POA: Diagnosis not present

## 2013-04-04 LAB — POCT INR: INR: 2.9

## 2013-04-07 ENCOUNTER — Other Ambulatory Visit: Payer: Self-pay | Admitting: Internal Medicine

## 2013-04-10 NOTE — Telephone Encounter (Signed)
Temazepam called to pharmacy  

## 2013-05-02 ENCOUNTER — Ambulatory Visit (INDEPENDENT_AMBULATORY_CARE_PROVIDER_SITE_OTHER): Payer: Medicare Other | Admitting: General Practice

## 2013-05-02 DIAGNOSIS — I4891 Unspecified atrial fibrillation: Secondary | ICD-10-CM | POA: Diagnosis not present

## 2013-05-02 LAB — POCT INR: INR: 3.7

## 2013-05-17 ENCOUNTER — Other Ambulatory Visit: Payer: Self-pay | Admitting: Physician Assistant

## 2013-05-22 ENCOUNTER — Other Ambulatory Visit: Payer: Self-pay | Admitting: Physician Assistant

## 2013-05-30 ENCOUNTER — Ambulatory Visit (INDEPENDENT_AMBULATORY_CARE_PROVIDER_SITE_OTHER): Payer: Medicare Other | Admitting: General Practice

## 2013-05-30 DIAGNOSIS — I4891 Unspecified atrial fibrillation: Secondary | ICD-10-CM

## 2013-05-30 NOTE — Progress Notes (Signed)
Pre-visit discussion using our clinic review tool. No additional management support is needed unless otherwise documented below in the visit note.  

## 2013-06-20 ENCOUNTER — Other Ambulatory Visit: Payer: Self-pay | Admitting: Internal Medicine

## 2013-06-28 ENCOUNTER — Ambulatory Visit (INDEPENDENT_AMBULATORY_CARE_PROVIDER_SITE_OTHER): Payer: Medicare Other | Admitting: General Practice

## 2013-06-28 DIAGNOSIS — I4891 Unspecified atrial fibrillation: Secondary | ICD-10-CM

## 2013-06-28 NOTE — Progress Notes (Signed)
Pre-visit discussion using our clinic review tool. No additional management support is needed unless otherwise documented below in the visit note.  

## 2013-06-30 ENCOUNTER — Telehealth: Payer: Self-pay

## 2013-06-30 MED ORDER — DOXEPIN HCL 3 MG PO TABS: 3.0000 mg | ORAL_TABLET | Freq: Every day | ORAL | Status: DC

## 2013-06-30 NOTE — Telephone Encounter (Signed)
Phone call to patient letting him know the new Rx being sent to his pharmacy in place of Temazepam.

## 2013-07-26 ENCOUNTER — Ambulatory Visit (INDEPENDENT_AMBULATORY_CARE_PROVIDER_SITE_OTHER): Payer: Medicare Other | Admitting: General Practice

## 2013-07-26 DIAGNOSIS — I4891 Unspecified atrial fibrillation: Secondary | ICD-10-CM | POA: Diagnosis not present

## 2013-07-26 LAB — POCT INR: INR: 2.2

## 2013-07-26 NOTE — Progress Notes (Signed)
Pre-visit discussion using our clinic review tool. No additional management support is needed unless otherwise documented below in the visit note.  

## 2013-08-07 ENCOUNTER — Telehealth: Payer: Self-pay | Admitting: Internal Medicine

## 2013-08-07 DIAGNOSIS — J449 Chronic obstructive pulmonary disease, unspecified: Secondary | ICD-10-CM

## 2013-08-07 NOTE — Telephone Encounter (Signed)
[name] called request an order to get the liquid oxygen tank remove and put Ricardo Perkins to be put back on regular oxygen. Please advise. Please fax the order to [facility] and the fax # [phone].

## 2013-08-07 NOTE — Telephone Encounter (Signed)
Ok to call in a verbal order to home [facility] to change O2 back to regular concentrator from liquid system.

## 2013-08-07 NOTE — Telephone Encounter (Signed)
I called [facility]. They can not take a verbal. Will you write the order and I can fax it. Thanks

## 2013-09-11 ENCOUNTER — Other Ambulatory Visit: Payer: Self-pay | Admitting: Internal Medicine

## 2013-09-12 ENCOUNTER — Other Ambulatory Visit: Payer: Self-pay | Admitting: General Practice

## 2013-09-12 ENCOUNTER — Ambulatory Visit (INDEPENDENT_AMBULATORY_CARE_PROVIDER_SITE_OTHER): Payer: Medicare Other | Admitting: General Practice

## 2013-09-12 DIAGNOSIS — I4891 Unspecified atrial fibrillation: Secondary | ICD-10-CM

## 2013-09-12 DIAGNOSIS — Z5181 Encounter for therapeutic drug level monitoring: Secondary | ICD-10-CM | POA: Diagnosis not present

## 2013-09-12 LAB — POCT INR: INR: 2.2

## 2013-09-12 MED ORDER — WARFARIN SODIUM 3 MG PO TABS: ORAL_TABLET | ORAL | Status: DC

## 2013-09-12 NOTE — Progress Notes (Signed)
Pre visit review using our clinic review tool, if applicable. No additional management support is needed unless otherwise documented below in the visit note. 

## 2013-09-29 ENCOUNTER — Ambulatory Visit: Payer: Medicare Other | Admitting: Internal Medicine

## 2013-10-03 ENCOUNTER — Other Ambulatory Visit: Payer: Self-pay | Admitting: Internal Medicine

## 2013-10-05 ENCOUNTER — Other Ambulatory Visit: Payer: Self-pay | Admitting: Internal Medicine

## 2013-10-17 ENCOUNTER — Other Ambulatory Visit: Payer: Self-pay | Admitting: Internal Medicine

## 2013-10-17 ENCOUNTER — Other Ambulatory Visit: Payer: Self-pay

## 2013-10-17 MED ORDER — FUROSEMIDE 20 MG PO TABS: ORAL_TABLET | ORAL | Status: DC

## 2013-10-19 ENCOUNTER — Encounter: Payer: Self-pay | Admitting: Internal Medicine

## 2013-10-19 ENCOUNTER — Ambulatory Visit (INDEPENDENT_AMBULATORY_CARE_PROVIDER_SITE_OTHER): Payer: Medicare Other | Admitting: Internal Medicine

## 2013-10-19 VITALS — BP 138/80 | HR 46 | Temp 97.0°F | Ht 67.5 in | Wt 189.0 lb

## 2013-10-19 DIAGNOSIS — I10 Essential (primary) hypertension: Secondary | ICD-10-CM | POA: Diagnosis not present

## 2013-10-19 DIAGNOSIS — I4891 Unspecified atrial fibrillation: Secondary | ICD-10-CM | POA: Diagnosis not present

## 2013-10-19 DIAGNOSIS — J449 Chronic obstructive pulmonary disease, unspecified: Secondary | ICD-10-CM | POA: Diagnosis not present

## 2013-10-19 MED ORDER — OMEPRAZOLE 20 MG PO CPDR: DELAYED_RELEASE_CAPSULE | ORAL | Status: DC

## 2013-10-19 NOTE — Progress Notes (Signed)
Subjective:    Patient ID: Ricardo Perkins, male    DOB: 1922-10-14, 80 y.o.   MRN: [id number]  HPI  Patient here for follow up -transfer primary care physician from Dr. Evlyn Courier to me because of retirement Reviewed chronic medical issues and interval medical events  Past Medical History  Diagnosis Date  . INSOMNIA, PERSISTENT   . DECREASED HEARING   . HYPERTENSION   . BRADYCARDIA     1st degree AVB  . RENAL INSUFFICIENCY   . BENIGN PROSTATIC HYPERTROPHY, WITH OBSTRUCTION   . ASTEATOTIC ECZEMA   . LEG EDEMA, BILATERAL   . PAROXYSMAL ATRIAL FIBRILLATION     coumadin rx  . Arthritis     knees, shoulders   . COPD (chronic obstructive pulmonary disease)     on chronic oxygen therapy qhs>daytime  . Advanced age   . Hx of echocardiogram     a. echo in 2009 (in [place]): normal EF, mild LVH;   b. echo 10/13:  mid Ant HK, mod LVH, EF 55%, trivial AI, mod LAE, mild RVE, mild reduced RVSF, PASP 46  . Hx of cardiovascular stress test     a.  MV 4/09:  possible small area of lateral ischemia at the apex, but Dr. Kirk Ruths reviewed this, and in fact, felt it was most likely normal.  b.  Myoview 3/13 was again low risk with an EF of 62% with small reversible defect in the apex suggestive of very mild apical ischemia.  . Large bilateral inguinal hernias - chronic, containing small bowel (right) and sigmoid colon (left) 01/15/2011  . OA (osteoarthritis) of knee     S/p right TKR March '13 (Dr. Wynelle Link)   . RHINITIS, VASOMOTOR 01/07/2010    Review of Systems  Constitutional: Negative for fever, fatigue and unexpected weight change.  Respiratory: Negative for cough and shortness of breath.   Cardiovascular: Negative for chest pain, palpitations and leg swelling.  Musculoskeletal: Positive for arthralgias. Negative for gait problem, joint swelling and myalgias.  Neurological: Negative for weakness, light-headedness and numbness.  Psychiatric/Behavioral: Positive for sleep disturbance  (chronic). Negative for dysphoric mood. The patient is not nervous/anxious.        Objective:   Physical Exam  BP 138/80  Pulse 46  Temp(Src) 97 F (36.1 C) (Oral)  Ht 5' 7.5" (1.715 m)  Wt 189 lb (85.73 kg)  BMI 29.15 kg/m2  SpO2 93% Wt Readings from Last 3 Encounters:  10/19/13 189 lb (85.73 kg)  11/30/12 188 lb (85.276 kg)  09/05/12 190 lb 12.8 oz (86.546 kg)   Constitutional: he appears well-developed and well-nourished. No distress.  HENT: Head: Normocephalic and atraumatic. Ears:wears B hearing aides. Mouth/Throat: Oropharynx is clear and moist. No oropharyngeal exudate.  Eyes: Conjunctivae and EOM are normal. Pupils are equal, round, and reactive to light. No scleral icterus.  Neck: Normal range of motion. Neck supple. No JVD present. No thyromegaly present.  Cardiovascular: Normal rate, irregular rhythm and normal heart sounds.  No murmur heard. No BLE edema. Pulmonary/Chest: Effort normal and breath sounds diminished at bases. No respiratory distress. he has no wheezes.  Musculoskeletal: Hands with senile OA changes -trigger finger of right second and third fingers -left index finger with mild gross deformity at MCP from prior remote injury 1946 Neurological: he is alert and oriented to person, place, and time. No cranial nerve deficit. Coordination, balance, strength, speech and gait are normal.  Skin: Skin is warm and dry. No rash noted. No erythema.  Psychiatric: he has a normal mood and affect. behavior is normal. Judgment and thought content normal.   Lab Results  Component Value Date   WBC 6.3 11/30/2012   HGB 15.6 11/30/2012   HCT 46.1 11/30/2012   PLT 191.0 11/30/2012   GLUCOSE 115* 12/01/2012   ALT 13 12/06/2011   AST 13 12/06/2011   NA 137 12/01/2012   K 4.1 12/01/2012   CL 105 12/01/2012   CREATININE 1.6* 12/01/2012   BUN 21 12/01/2012   CO2 24 12/01/2012   TSH 1.58 05/12/2012   INR 2.2 09/12/2013    Ct Abdomen Pelvis Wo Contrast  02/26/2012   *RADIOLOGY  REPORT*  Clinical Data: Flank pain.  Renal insufficiency.  CT ABDOMEN AND PELVIS WITHOUT CONTRAST  Technique:  Multidetector CT imaging of the abdomen and pelvis was performed following the standard protocol without intravenous contrast.  Comparison: 12/13/2010.  Findings: Dependent atelectasis at the lung bases.  Subpleural lucency is present in the lower lobes which may represent fibrotic change or paraseptal emphysema.  Small bleb is present along the posterior junction line.  Unenhanced CT was performed per clinician order.  Lack of IV contrast limits sensitivity and specificity, especially for evaluation of abdominal/pelvic solid viscera.  Tiny calcified gallstone is present in the gallbladder neck.  Spleen normal. Pancreas, stomach and common bile duct appear within normal limits.  Bilateral perinephric stranding is present.  This is greater on the left than right.  There is mild left hydroureteronephrosis.  The left ureter can be followed to the urinary bladder where there is a punctate calculus at the left UVJ.  No residual collecting system calculi are identified bilaterally.  Abdominal aortic atherosclerosis with mild infrarenal abdominal aortic dilation.  Colonic diverticulosis without diverticulitis. Urinary bladder appears normal.  Prompt right-sided stool burden. Small bowel appears within normal limits.  No aggressive osseous lesions.  Mild lumbar spondylosis. Kidneys are atrophic bilaterally. The right inguinal hernia is less pronounced today. It is unclear whether this has undergone herniorrhaphy or spontaneously reduced.  There are no aggressive osseous lesions.  IMPRESSION: 1.  Mild left hydroureteronephrosis. Punctate distal left ureteral calculus at the left UVJ.  No residual collecting system calculi. 2.  Cholelithiasis. 3.  Atherosclerosis. 4.  Bilateral renal atrophy.  Original Report Authenticated By: Dereck Ligas, M.D.      Assessment & Plan:   Problem List Items Addressed This  Visit   C O P D - Primary     Long history of abuse, complete cessation in 2009 of all smoking Residual COPD, but not symptomatic Prescribed chronic 02, uses at night and as needed The current medical regimen is effective;  continue present plan and medications.     HYPERTENSION      BP Readings from Last 3 Encounters:  10/19/13 138/80  11/30/12 122/78  09/05/12 108/70   The current medical regimen is effective;  continue present plan and medications.     PAROXYSMAL ATRIAL FIBRILLATION     Remains in sinus rhythm On atenolol and chronic warfarin.  Follows with the [facility] anticoagulation clinic for same Follows with cardiology annually The current medical regimen is effective;  continue present plan and medications.       Time spent with pt today 25 minutes, greater than 50% time spent counseling patient on A. Fib, hypertension and COPD and medication review. Also review of prior records  Reviewed patient's living will and desire for comfort care, no intervention Asked patient to bring in his MOST form for  annual update to reinforce these orders

## 2013-10-19 NOTE — Assessment & Plan Note (Signed)
BP Readings from Last 3 Encounters:  10/19/13 138/80  11/30/12 122/78  09/05/12 108/70   The current medical regimen is effective;  continue present plan and medications.

## 2013-10-19 NOTE — Patient Instructions (Signed)
It was good to see you today.  We have reviewed your prior records including labs and tests today  Medications reviewed and updated, no changes recommended at this time. Refill on medication(s) as discussed today.  Please schedule followup in 6-12 months, call sooner if problems.

## 2013-10-19 NOTE — Assessment & Plan Note (Signed)
Remains in sinus rhythm On atenolol and chronic warfarin.  Follows with the [facility] anticoagulation clinic for same Follows with cardiology annually The current medical regimen is effective;  continue present plan and medications.

## 2013-10-19 NOTE — Assessment & Plan Note (Signed)
Long history of abuse, complete cessation in 2009 of all smoking Residual COPD, but not symptomatic Prescribed chronic 02, uses at night and as needed The current medical regimen is effective;  continue present plan and medications.

## 2013-10-19 NOTE — Progress Notes (Signed)
Pre visit review using our clinic review tool, if applicable. No additional management support is needed unless otherwise documented below in the visit note. 

## 2013-10-23 ENCOUNTER — Ambulatory Visit: Payer: Medicare Other | Admitting: Internal Medicine

## 2013-10-24 ENCOUNTER — Ambulatory Visit (INDEPENDENT_AMBULATORY_CARE_PROVIDER_SITE_OTHER): Payer: Medicare Other | Admitting: General Practice

## 2013-10-24 DIAGNOSIS — I4891 Unspecified atrial fibrillation: Secondary | ICD-10-CM | POA: Diagnosis not present

## 2013-10-24 DIAGNOSIS — Z5181 Encounter for therapeutic drug level monitoring: Secondary | ICD-10-CM

## 2013-10-24 LAB — POCT INR: INR: 2.4

## 2013-10-24 NOTE — Progress Notes (Signed)
Pre visit review using our clinic review tool, if applicable. No additional management support is needed unless otherwise documented below in the visit note. 

## 2013-10-25 ENCOUNTER — Other Ambulatory Visit: Payer: Self-pay | Admitting: *Deleted

## 2013-10-25 MED ORDER — FINASTERIDE 5 MG PO TABS: 5.0000 mg | ORAL_TABLET | Freq: Every day | ORAL | Status: DC

## 2013-12-01 ENCOUNTER — Other Ambulatory Visit: Payer: Self-pay | Admitting: *Deleted

## 2013-12-01 MED ORDER — ATENOLOL 25 MG PO TABS: 12.5000 mg | ORAL_TABLET | Freq: Two times a day (BID) | ORAL | Status: DC

## 2013-12-05 ENCOUNTER — Ambulatory Visit (INDEPENDENT_AMBULATORY_CARE_PROVIDER_SITE_OTHER): Payer: Medicare Other | Admitting: General Practice

## 2013-12-05 DIAGNOSIS — Z5181 Encounter for therapeutic drug level monitoring: Secondary | ICD-10-CM | POA: Diagnosis not present

## 2013-12-05 DIAGNOSIS — I4891 Unspecified atrial fibrillation: Secondary | ICD-10-CM | POA: Diagnosis not present

## 2013-12-05 LAB — POCT INR: INR: 2.5

## 2013-12-05 NOTE — Progress Notes (Signed)
Pre visit review using our clinic review tool, if applicable. No additional management support is needed unless otherwise documented below in the visit note. 

## 2013-12-07 ENCOUNTER — Other Ambulatory Visit: Payer: Self-pay | Admitting: *Deleted

## 2013-12-07 MED ORDER — TAMSULOSIN HCL 0.4 MG PO CAPS: 0.4000 mg | ORAL_CAPSULE | Freq: Every day | ORAL | Status: DC

## 2013-12-21 ENCOUNTER — Encounter: Payer: Self-pay | Admitting: Cardiology

## 2013-12-21 ENCOUNTER — Encounter (INDEPENDENT_AMBULATORY_CARE_PROVIDER_SITE_OTHER): Payer: Self-pay

## 2013-12-21 ENCOUNTER — Ambulatory Visit (INDEPENDENT_AMBULATORY_CARE_PROVIDER_SITE_OTHER): Payer: Medicare Other | Admitting: Cardiology

## 2013-12-21 VITALS — BP 141/71 | HR 52 | Ht 67.5 in | Wt 191.4 lb

## 2013-12-21 DIAGNOSIS — I4891 Unspecified atrial fibrillation: Secondary | ICD-10-CM

## 2013-12-21 DIAGNOSIS — I10 Essential (primary) hypertension: Secondary | ICD-10-CM | POA: Diagnosis not present

## 2013-12-21 LAB — CBC WITH DIFFERENTIAL/PLATELET
BASOS ABS: 0 10*3/uL (ref 0.0–0.1)
Basophils Relative: 0.4 % (ref 0.0–3.0)
EOS ABS: 0.1 10*3/uL (ref 0.0–0.7)
Eosinophils Relative: 2.2 % (ref 0.0–5.0)
HCT: 47.2 % (ref 39.0–52.0)
Hemoglobin: 15.4 g/dL (ref 13.0–17.0)
LYMPHS PCT: 31.4 % (ref 12.0–46.0)
Lymphs Abs: 1.8 10*3/uL (ref 0.7–4.0)
MCHC: 32.6 g/dL (ref 30.0–36.0)
MCV: 85 fl (ref 78.0–100.0)
Monocytes Absolute: 0.3 10*3/uL (ref 0.1–1.0)
Monocytes Relative: 5.8 % (ref 3.0–12.0)
NEUTROS PCT: 60.2 % (ref 43.0–77.0)
Neutro Abs: 3.4 10*3/uL (ref 1.4–7.7)
Platelets: 203 10*3/uL (ref 150.0–400.0)
RBC: 5.55 Mil/uL (ref 4.22–5.81)
RDW: 16.6 % — AB (ref 11.5–15.5)
WBC: 5.6 10*3/uL (ref 4.0–10.5)

## 2013-12-21 LAB — BASIC METABOLIC PANEL
BUN: 18 mg/dL (ref 6–23)
CO2: 27 mEq/L (ref 19–32)
Calcium: 9 mg/dL (ref 8.4–10.5)
Chloride: 106 mEq/L (ref 96–112)
Creatinine, Ser: 1.3 mg/dL (ref 0.4–1.5)
GFR: 55.22 mL/min — ABNORMAL LOW (ref >60.00)
Glucose, Bld: 105 mg/dL — ABNORMAL HIGH (ref 70–99)
POTASSIUM: 4.4 meq/L (ref 3.5–5.1)
SODIUM: 138 meq/L (ref 135–145)

## 2013-12-21 NOTE — Patient Instructions (Signed)
Your physician wants you to follow-up in: ONE YEAR WITH DR CRENSHAW You will receive a reminder letter in the mail two months in advance. If you don't receive a letter, please call our office to schedule the follow-up appointment.   Your physician recommends that you HAVE LAB WORK TODAY 

## 2013-12-21 NOTE — Progress Notes (Signed)
HPI: Pleasant male for fu of atrial fibrillation. Myoview 3/13 was low risk with an EF of 62% with small reversible defect in the apex suggestive of very mild apical ischemia. Echocardiogram 05/20/12 demonstrated moderate LVH with normal LV function and mild pulmonary hypertension. Since last seen, the patient denies any dyspnea on exertion, orthopnea, PND, pedal edema, palpitations, syncope or chest pain. No falls.   Current Outpatient Prescriptions  Medication Sig Dispense Refill  . amLODipine (NORVASC) 5 MG tablet TAKE 1 TABLET (5 MG TOTAL) BY MOUTH DAILY.  90 tablet  0  . atenolol (TENORMIN) 25 MG tablet Take 0.5 tablets (12.5 mg total) by mouth 2 (two) times daily.  90 tablet  3  . finasteride (PROSCAR) 5 MG tablet Take 1 tablet (5 mg total) by mouth daily.  90 tablet  3  . furosemide (LASIX) 20 MG tablet TAKE ONE TABLET DAILY AS NEEDED  30 tablet  3  . omeprazole (PRILOSEC) 20 MG capsule TAKE ONE CAPSULE BY MOUTH EVERY DAY AS NEEDED  90 capsule  1  . tamsulosin (FLOMAX) 0.4 MG CAPS capsule Take 1 capsule (0.4 mg total) by mouth daily after supper.  90 capsule  3  . temazepam (RESTORIL) 30 MG capsule TAKE ONE CAPSULE BY MOUTH AT BEDTIME  30 capsule  5  . warfarin (COUMADIN) 3 MG tablet Take as directed by anticoagulation clinic  110 tablet  3   No current facility-administered medications for this visit.     Past Medical History  Diagnosis Date  . INSOMNIA, PERSISTENT   . DECREASED HEARING   . HYPERTENSION   . BRADYCARDIA     1st degree AVB  . RENAL INSUFFICIENCY   . BENIGN PROSTATIC HYPERTROPHY, WITH OBSTRUCTION   . ASTEATOTIC ECZEMA   . LEG EDEMA, BILATERAL   . PAROXYSMAL ATRIAL FIBRILLATION     coumadin rx  . Arthritis     knees, shoulders   . COPD (chronic obstructive pulmonary disease)     on chronic oxygen therapy qhs>daytime  . Advanced age   . Hx of echocardiogram     a. echo in 2009 (in [place]): normal EF, mild LVH;   b. echo 10/13:  mid Ant HK, mod LVH,  EF 55%, trivial AI, mod LAE, mild RVE, mild reduced RVSF, PASP 46  . Hx of cardiovascular stress test     a.  MV 4/09:  possible small area of lateral ischemia at the apex, but Dr. Kirk Ruths reviewed this, and in fact, felt it was most likely normal.  b.  Myoview 3/13 was again low risk with an EF of 62% with small reversible defect in the apex suggestive of very mild apical ischemia.  . Large bilateral inguinal hernias - chronic, containing small bowel (right) and sigmoid colon (left) 01/15/2011    s/p B repair  . OA (osteoarthritis) of knee     S/p right TKR March '13 (Dr. Wynelle Link)   . RHINITIS, VASOMOTOR 01/07/2010    Past Surgical History  Procedure Laterality Date  . Appendectomy      @ 23  . Tonsillectomy    . Hernia repair  01/2011    bilateral inguinal hernia   . Eye surgery      bilateral cataract surgery   . Other surgical history      left finger surgery   . Knee surgery  right  . Total knee arthroplasty  10/26/2011    Procedure: TOTAL KNEE ARTHROPLASTY;  Surgeon: Dione Plover  [name], MD;  Location: [facility];  Service: Orthopedics;  Laterality: Right;    History   Social History  . Marital Status: Single    Spouse Name: N/A    Number of Children: N/A  . Years of Education: N/A   Occupational History  . Not on file.   Social History Main Topics  . Smoking status: Former Smoker -- 7 years    Quit date: 07/21/2007  . Smokeless tobacco: Never Used     Comment: History of very heavy smoking.  Quit 2009  . Alcohol Use: Yes     Comment: wine occasional   . Drug Use: No  . Sexual Activity: Not Currently   Other Topics Concern  . 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 Remaining systems are negative.  Physical Exam: Well-developed well-nourished in no acute distress.  Skin is warm and dry.  HEENT is normal.  Neck is supple.  Chest is clear to auscultation with normal expansion.  Cardiovascular exam is regular rate and rhythm.  Abdominal exam nontender or distended. No masses palpated. Extremities show trace edema. neuro grossly intact  ECG Sinus rhythm with first degree AV block. Prior anterior lateral infarct. Nonspecific ST changes.

## 2013-12-21 NOTE — Assessment & Plan Note (Signed)
Blood pressure controlled. Continue present medications. Check potassium and renal function. 

## 2013-12-21 NOTE — Assessment & Plan Note (Signed)
Patient remains in sinus rhythm. Continue beta blocker and Coumadin. Check hemoglobin and renal function.

## 2014-01-03 ENCOUNTER — Other Ambulatory Visit: Payer: Self-pay | Admitting: *Deleted

## 2014-01-03 MED ORDER — AMLODIPINE BESYLATE 5 MG PO TABS: ORAL_TABLET | ORAL | Status: DC

## 2014-01-05 ENCOUNTER — Other Ambulatory Visit: Payer: Self-pay | Admitting: *Deleted

## 2014-01-05 MED ORDER — AMLODIPINE BESYLATE 5 MG PO TABS: ORAL_TABLET | ORAL | Status: DC

## 2014-01-09 ENCOUNTER — Ambulatory Visit: Payer: Medicare Other | Admitting: Cardiology

## 2014-01-09 DIAGNOSIS — H6120 Impacted cerumen, unspecified ear: Secondary | ICD-10-CM | POA: Diagnosis not present

## 2014-01-09 DIAGNOSIS — H905 Unspecified sensorineural hearing loss: Secondary | ICD-10-CM | POA: Diagnosis not present

## 2014-01-16 ENCOUNTER — Ambulatory Visit (INDEPENDENT_AMBULATORY_CARE_PROVIDER_SITE_OTHER): Payer: Medicare Other | Admitting: General Practice

## 2014-01-16 DIAGNOSIS — I4891 Unspecified atrial fibrillation: Secondary | ICD-10-CM | POA: Diagnosis not present

## 2014-01-16 DIAGNOSIS — Z5181 Encounter for therapeutic drug level monitoring: Secondary | ICD-10-CM

## 2014-01-16 LAB — POCT INR: INR: 3.2

## 2014-01-16 NOTE — Progress Notes (Signed)
Pre visit review using our clinic review tool, if applicable. No additional management support is needed unless otherwise documented below in the visit note. 

## 2014-02-13 ENCOUNTER — Ambulatory Visit (INDEPENDENT_AMBULATORY_CARE_PROVIDER_SITE_OTHER): Payer: Medicare Other | Admitting: General Practice

## 2014-02-13 DIAGNOSIS — Z5181 Encounter for therapeutic drug level monitoring: Secondary | ICD-10-CM

## 2014-02-13 DIAGNOSIS — I4891 Unspecified atrial fibrillation: Secondary | ICD-10-CM | POA: Diagnosis not present

## 2014-02-13 LAB — POCT INR: INR: 3.1

## 2014-02-13 NOTE — Progress Notes (Signed)
Pre visit review using our clinic review tool, if applicable. No additional management support is needed unless otherwise documented below in the visit note. 

## 2014-03-14 ENCOUNTER — Ambulatory Visit (INDEPENDENT_AMBULATORY_CARE_PROVIDER_SITE_OTHER): Payer: Medicare Other | Admitting: *Deleted

## 2014-03-14 DIAGNOSIS — Z5181 Encounter for therapeutic drug level monitoring: Secondary | ICD-10-CM | POA: Diagnosis not present

## 2014-03-14 DIAGNOSIS — I4891 Unspecified atrial fibrillation: Secondary | ICD-10-CM | POA: Diagnosis not present

## 2014-03-14 LAB — POCT INR: INR: 3.2

## 2014-04-11 ENCOUNTER — Other Ambulatory Visit: Payer: Self-pay

## 2014-04-11 ENCOUNTER — Ambulatory Visit (INDEPENDENT_AMBULATORY_CARE_PROVIDER_SITE_OTHER): Payer: Medicare Other | Admitting: *Deleted

## 2014-04-11 DIAGNOSIS — I4891 Unspecified atrial fibrillation: Secondary | ICD-10-CM

## 2014-04-11 DIAGNOSIS — Z5181 Encounter for therapeutic drug level monitoring: Secondary | ICD-10-CM | POA: Diagnosis not present

## 2014-04-11 LAB — POCT INR: INR: 3.8

## 2014-04-11 MED ORDER — TEMAZEPAM 30 MG PO CAPS: ORAL_CAPSULE | ORAL | Status: DC

## 2014-04-11 NOTE — Telephone Encounter (Signed)
rx request for temazepam 30 mg 1 cap po qhs, do you want refilled?

## 2014-04-18 ENCOUNTER — Telehealth: Payer: Self-pay | Admitting: Cardiology

## 2014-04-18 DIAGNOSIS — R609 Edema, unspecified: Secondary | ICD-10-CM

## 2014-04-18 MED ORDER — FUROSEMIDE 20 MG PO TABS
40.0000 mg | ORAL_TABLET | Freq: Every day | ORAL | Status: DC
Start: 2014-04-18 — End: 2014-05-19

## 2014-04-18 NOTE — Telephone Encounter (Signed)
Follow up           Correction on BP for today : 186/57

## 2014-04-18 NOTE — Telephone Encounter (Signed)
Spoke with pt dtr, patient bp has been fluctuating. He is also having swelling in his feet. The left is swollen in the am and by the end of the day it is in both. They report he is always SOB, but there has been no change. He denies orthopnea. He has taken furosemide 20 mg daily for the last 5 days with no change. Will forward for dr Stanford Breed review

## 2014-04-18 NOTE — Telephone Encounter (Signed)
Change lasix to 40 mg daiily; bmet one week Kirk Ruths

## 2014-04-18 NOTE — Telephone Encounter (Signed)
Spoke with pt dtr, Aware of dr Jacalyn Lefevre recommendations.  New script sent into the pharm.

## 2014-04-18 NOTE — Telephone Encounter (Signed)
New message          C/o high bp 178/122 and swelling in his feet / pt has taken the lasix but is not working

## 2014-04-20 ENCOUNTER — Ambulatory Visit: Payer: Medicare Other | Admitting: Internal Medicine

## 2014-04-20 ENCOUNTER — Other Ambulatory Visit: Payer: Self-pay | Admitting: *Deleted

## 2014-04-20 MED ORDER — AMLODIPINE BESYLATE 5 MG PO TABS: ORAL_TABLET | ORAL | Status: DC

## 2014-04-23 ENCOUNTER — Other Ambulatory Visit (INDEPENDENT_AMBULATORY_CARE_PROVIDER_SITE_OTHER): Payer: Medicare Other

## 2014-04-23 ENCOUNTER — Encounter: Payer: Self-pay | Admitting: Internal Medicine

## 2014-04-23 ENCOUNTER — Ambulatory Visit (INDEPENDENT_AMBULATORY_CARE_PROVIDER_SITE_OTHER): Payer: Medicare Other | Admitting: Internal Medicine

## 2014-04-23 VITALS — BP 140/88 | HR 61 | Temp 97.7°F | Resp 16 | Ht 67.0 in | Wt 185.8 lb

## 2014-04-23 DIAGNOSIS — IMO0001 Reserved for inherently not codable concepts without codable children: Secondary | ICD-10-CM

## 2014-04-23 DIAGNOSIS — H9193 Unspecified hearing loss, bilateral: Secondary | ICD-10-CM

## 2014-04-23 DIAGNOSIS — J300 Vasomotor rhinitis: Secondary | ICD-10-CM

## 2014-04-23 DIAGNOSIS — I10 Essential (primary) hypertension: Secondary | ICD-10-CM

## 2014-04-23 DIAGNOSIS — Z23 Encounter for immunization: Secondary | ICD-10-CM

## 2014-04-23 DIAGNOSIS — R609 Edema, unspecified: Secondary | ICD-10-CM | POA: Diagnosis not present

## 2014-04-23 DIAGNOSIS — J449 Chronic obstructive pulmonary disease, unspecified: Secondary | ICD-10-CM | POA: Diagnosis not present

## 2014-04-23 DIAGNOSIS — I480 Paroxysmal atrial fibrillation: Secondary | ICD-10-CM

## 2014-04-23 LAB — BASIC METABOLIC PANEL
BUN: 24 mg/dL — ABNORMAL HIGH (ref 6–23)
CALCIUM: 9.2 mg/dL (ref 8.4–10.5)
CO2: 25 meq/L (ref 19–32)
Chloride: 100 mEq/L (ref 96–112)
Creatinine, Ser: 1.6 mg/dL — ABNORMAL HIGH (ref 0.4–1.5)
GFR: 43.73 mL/min — AB (ref >60.00)
GLUCOSE: 124 mg/dL — AB (ref 70–99)
Potassium: 4.1 mEq/L (ref 3.5–5.1)
Sodium: 136 mEq/L (ref 135–145)

## 2014-04-23 MED ORDER — OMEPRAZOLE 20 MG PO CPDR: DELAYED_RELEASE_CAPSULE | ORAL | Status: DC

## 2014-04-23 NOTE — Patient Instructions (Signed)
One thing you can try for your nose is flonase (available over the counter). It is a nose spray that you use once a day. Use 2 puffs in each nostril once a day to help decrease the dripping with meals.   We will have you come back in about 9-12 months to check in with your blood pressure.   If you have problems or questions before then please feel free to call the office.  We have given you the flu shot today.

## 2014-04-23 NOTE — Assessment & Plan Note (Signed)
Advised patient that flonase or nasonex are over the counter. He can trial once daily usage to see if this decreases his rhinitis with meals.

## 2014-04-23 NOTE — Assessment & Plan Note (Signed)
Patient is doing well at this time and no problems with SOB on exertion. He is not needing any medications for it at this time.

## 2014-04-23 NOTE — Progress Notes (Signed)
   Subjective:    Patient ID: Ricardo Perkins, male    DOB: [birth year], 80 y.o.   MRN: [id number]  HPI The patient is an 80 YO man who is coming in for a follow up of his blood pressure and COPD. His PMH, allergies, medications reviewed. He is having some mild swelling in his legs and ankles that gets worse throughout the day. He never has swelling so bad that his shoes do not fit. He elevated his legs but is not sure that helps much. He does notice that his lasix helps but causes him to run to the bathroom for most of the day and does not like to take it. He denies SOB, chest pain, abdominal pain. He denies calf pain or tenderness. He has some vasomotor rhinitis with eating and is wondering if there is something that was over the counter he could try for it.  Review of Systems  Constitutional: Negative for fever, activity change, appetite change and fatigue.  HENT: Negative.   Eyes: Negative.   Respiratory: Negative for cough, chest tightness, shortness of breath and wheezing.   Cardiovascular: Positive for leg swelling. Negative for chest pain and palpitations.  Gastrointestinal: Negative for nausea, abdominal pain, diarrhea, constipation and abdominal distention.  Endocrine: Negative.   Musculoskeletal: Negative for arthralgias, gait problem and myalgias.  Skin: Negative.   Neurological: Negative for dizziness, weakness, light-headedness and headaches.      Objective:   Physical Exam  Constitutional: He is oriented to person, place, and time. He appears well-developed and well-nourished. No distress.  HENT:  Head: Normocephalic and atraumatic.  Eyes: EOM are normal.  Neck: Normal range of motion.  Cardiovascular: Normal rate and regular rhythm.   Pulmonary/Chest: Effort normal and breath sounds normal. No respiratory distress. He has no wheezes. He has no rales.  Abdominal: Soft. Bowel sounds are normal. He exhibits no distension. There is no tenderness.  Neurological: He is alert and  oriented to person, place, and time.  Skin: Skin is warm and dry.   Filed Vitals:   04/23/14 1345  BP: 140/88  Pulse: 61  Temp: 97.7 F (36.5 C)  TempSrc: Oral  Resp: 16  Height: 5\' 7"  (1.702 m)  Weight: 185 lb 12.8 oz (84.278 kg)  SpO2: 91%      Assessment & Plan:

## 2014-04-23 NOTE — Assessment & Plan Note (Signed)
He is still able to follow conversations but loses things in crowds.

## 2014-04-23 NOTE — Assessment & Plan Note (Signed)
BP well controlled at this time on amlodipine 5 mg daily, atenolol 25 mg daily. He does not take lasix very much at all. If he continues to have complaints about leg swelling could consider change to amlodipine. At this time would suspect some venous insufficiency and would recommend compression stockings (he does not wish to pursue at this time). Last BMP reviewed and normal.

## 2014-04-23 NOTE — Assessment & Plan Note (Signed)
In sinus rhythmn on exam. Continue warfarin and beta blocker.

## 2014-04-23 NOTE — Progress Notes (Signed)
Pre visit review using our clinic review tool, if applicable. No additional management support is needed unless otherwise documented below in the visit note. 

## 2014-04-25 ENCOUNTER — Other Ambulatory Visit: Payer: Self-pay | Admitting: *Deleted

## 2014-04-25 ENCOUNTER — Other Ambulatory Visit: Payer: Self-pay

## 2014-04-25 DIAGNOSIS — I10 Essential (primary) hypertension: Secondary | ICD-10-CM

## 2014-04-25 DIAGNOSIS — N259 Disorder resulting from impaired renal tubular function, unspecified: Secondary | ICD-10-CM

## 2014-04-25 MED ORDER — OMEPRAZOLE 20 MG PO CPDR: DELAYED_RELEASE_CAPSULE | ORAL | Status: DC

## 2014-05-09 ENCOUNTER — Ambulatory Visit (INDEPENDENT_AMBULATORY_CARE_PROVIDER_SITE_OTHER): Payer: Medicare Other | Admitting: *Deleted

## 2014-05-09 DIAGNOSIS — I10 Essential (primary) hypertension: Secondary | ICD-10-CM | POA: Diagnosis not present

## 2014-05-09 DIAGNOSIS — N259 Disorder resulting from impaired renal tubular function, unspecified: Secondary | ICD-10-CM | POA: Diagnosis not present

## 2014-05-09 DIAGNOSIS — I4891 Unspecified atrial fibrillation: Secondary | ICD-10-CM | POA: Diagnosis not present

## 2014-05-09 DIAGNOSIS — Z5181 Encounter for therapeutic drug level monitoring: Secondary | ICD-10-CM

## 2014-05-09 DIAGNOSIS — I480 Paroxysmal atrial fibrillation: Secondary | ICD-10-CM

## 2014-05-09 LAB — BASIC METABOLIC PANEL WITH GFR
BUN: 23 mg/dL (ref 6–23)
CALCIUM: 9.2 mg/dL (ref 8.4–10.5)
CO2: 26 mEq/L (ref 19–32)
Chloride: 105 mEq/L (ref 96–112)
Creat: 1.37 mg/dL — ABNORMAL HIGH (ref 0.50–1.35)
GFR, Est African American: 52 mL/min — ABNORMAL LOW
GFR, Est Non African American: 45 mL/min — ABNORMAL LOW
Glucose, Bld: 98 mg/dL (ref 70–99)
Potassium: 4.5 mEq/L (ref 3.5–5.3)
Sodium: 140 mEq/L (ref 135–145)

## 2014-05-09 LAB — POCT INR: INR: 3.6

## 2014-05-19 ENCOUNTER — Other Ambulatory Visit: Payer: Self-pay

## 2014-05-19 DIAGNOSIS — R609 Edema, unspecified: Secondary | ICD-10-CM

## 2014-05-19 MED ORDER — FUROSEMIDE 20 MG PO TABS: 40.0000 mg | ORAL_TABLET | Freq: Every day | ORAL | Status: DC

## 2014-06-06 ENCOUNTER — Telehealth: Payer: Self-pay | Admitting: Family

## 2014-06-06 ENCOUNTER — Ambulatory Visit (INDEPENDENT_AMBULATORY_CARE_PROVIDER_SITE_OTHER): Payer: Medicare Other

## 2014-06-06 DIAGNOSIS — I4891 Unspecified atrial fibrillation: Secondary | ICD-10-CM

## 2014-06-06 DIAGNOSIS — I480 Paroxysmal atrial fibrillation: Secondary | ICD-10-CM | POA: Diagnosis not present

## 2014-06-06 DIAGNOSIS — Z5181 Encounter for therapeutic drug level monitoring: Secondary | ICD-10-CM | POA: Diagnosis not present

## 2014-06-06 LAB — POCT INR: INR: 4

## 2014-06-06 NOTE — Telephone Encounter (Signed)
Called and left a message for call back  

## 2014-06-06 NOTE — Telephone Encounter (Signed)
Agree with plan - would recheck in 2 weeks.

## 2014-06-19 ENCOUNTER — Telehealth: Payer: Self-pay | Admitting: Family

## 2014-06-19 ENCOUNTER — Ambulatory Visit (INDEPENDENT_AMBULATORY_CARE_PROVIDER_SITE_OTHER): Payer: Medicare Other

## 2014-06-19 DIAGNOSIS — I4891 Unspecified atrial fibrillation: Secondary | ICD-10-CM

## 2014-06-19 DIAGNOSIS — Z5181 Encounter for therapeutic drug level monitoring: Secondary | ICD-10-CM

## 2014-06-19 DIAGNOSIS — I480 Paroxysmal atrial fibrillation: Secondary | ICD-10-CM | POA: Diagnosis not present

## 2014-06-19 LAB — POCT INR: INR: 1.7

## 2014-06-19 NOTE — Telephone Encounter (Signed)
Agree with plan 

## 2014-06-20 ENCOUNTER — Telehealth: Payer: Self-pay | Admitting: Cardiology

## 2014-06-20 NOTE — Telephone Encounter (Signed)
Spoke with pt dtr, for several days the patient's fingers and toes are turning blue and then red. They are numb on the tips. He has an appt with a new PCP tomorrow and wanted to know if they should see them or [place]. No other problems. Will forward for dr Stanford Breed review

## 2014-06-20 NOTE — Telephone Encounter (Signed)
Primary care eval Ricardo Perkins

## 2014-06-20 NOTE — Telephone Encounter (Signed)
New Message   Pt daughter called that the patients fingers are turning blue, numb and his hands are red came in for coumadin. Was advised to see PCP. Pcp had an appt for 12/03. Pt daughter states that was too far away. Sent message to triage at [facility]

## 2014-06-21 ENCOUNTER — Encounter: Payer: Self-pay | Admitting: Internal Medicine

## 2014-06-21 ENCOUNTER — Ambulatory Visit (INDEPENDENT_AMBULATORY_CARE_PROVIDER_SITE_OTHER): Payer: Medicare Other | Admitting: Internal Medicine

## 2014-06-21 ENCOUNTER — Telehealth: Payer: Self-pay | Admitting: *Deleted

## 2014-06-21 VITALS — BP 160/100 | HR 54 | Temp 97.8°F | Wt 186.8 lb

## 2014-06-21 DIAGNOSIS — I10 Essential (primary) hypertension: Secondary | ICD-10-CM

## 2014-06-21 DIAGNOSIS — I480 Paroxysmal atrial fibrillation: Secondary | ICD-10-CM | POA: Diagnosis not present

## 2014-06-21 DIAGNOSIS — I7300 Raynaud's syndrome without gangrene: Secondary | ICD-10-CM | POA: Diagnosis not present

## 2014-06-21 MED ORDER — AMLODIPINE BESYLATE 10 MG PO TABS: 10.0000 mg | ORAL_TABLET | Freq: Every day | ORAL | Status: DC

## 2014-06-21 NOTE — Telephone Encounter (Signed)
[facility] Patient Name: Ricardo Perkins Gender: Male DOB: [birth year] Age: 80 Y 96 M 23 D Return Phone Number: [phone] (Primary) Address: [address] City/State/Zip: [place] [address] Client [facility] - Client Client Site [facility] - Day Physician Vertell Novak Contact Type Call Call Type Triage / Clinical Caller Name vicki reed Relationship To Patient Daughter Return Phone Number [phone] (Primary) Chief Complaint Numbness Initial Comment Caller States fathers fingers, feet are also numb. PreDisposition Call Doctor Nurse Assessment Nurse: Mechele Dawley, RN, Amy Date/Time Eilene Ghazi Time): 06/20/2014 3:50:12 PM Confirm and document reason for call. If symptomatic, describe symptoms. ---FINGERTIPS AND TOES ARE BLUE AND NUMB. HE JUST STATED RECENTLY. DAUGHTER IS UPSET THAT HE WAS SEEN IN THE OFFICE THIS MORNING AND NOTHING WAS DONE. 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 HE IS ON 4 LITERS VIA [place]. HTN, AFIB, PROSTATE, OXYGEN. Has the patient traveled out of the country within the last 30 days? ---Not Applicable Does the patient require triage? ---Yes Related visit to physician within the last 2 weeks? ---Yes Does the PT have any chronic conditions? (i.e. diabetes, asthma, etc.) ---Yes List chronic conditions. ---OXYGEN, AFIB, HTN, Guidelines Guideline Title Affirmed Question Affirmed Notes Nurse Date/Time (Eastern Time) Neurologic Deficit Patient sounds very sick or weak to the triager DUE TO HAVING COMPLAINTS OF BLUE FINGERS, NUMBNESS IN [place]. FEEL PATIENT NEEDS TO BE SEEN. DAUGHTER STATES HE ALSO HAS BEEN SOB. Louise, [place], Maribel 06/20/2014 4:00:55 PM Disp. Time Eilene Ghazi Time) Disposition Final User 06/20/2014 3:34:44 PM Send to Urgent Queue Donato Heinz PLEASE NOTE: All timestamps contained within this report are  represented as [place] Standard Time. CONFIDENTIALTY NOTICE: This fax transmission is intended only for the addressee. It contains information that is legally privileged, confidential or otherwise protected from use or disclosure. If you are not the intended recipient, you are strictly prohibited from reviewing, disclosing, copying using or disseminating any of this information or taking any action in reliance on or regarding this information. If you have received this fax in error, please notify us immediately by telephone so that we can arrange for its return to [place]. Phone: [phone], Toll-Free: [phone], Fax: [phone] Page: 2 of 2 Call Id: [id number] 06/20/2014 4:15:50 PM Go to ED Now (or PCP triage) Yes Mechele Dawley, RN, Amy Caller Understands: No Disagree/Comply: Disagree Disagree/Comply Reason: Disagree with instructions Care Advice Given Per Guideline GO TO ED NOW (OR PCP TRIAGE): CARE ADVICE given per Neurologic Deficit (Adult) guideline. Comments User: Susanne Borders, RN Date/Time Eilene Ghazi Time): 06/20/2014 4:19:55 PM CALLED OFFICE TO SEE IF THEY WERE ABLE TO GET THE PATIENT IN TODAY. DAUGHTER STATES THERE IS APPT FOR TOMORROW ALREADY MADE. WAS INFORMED THAT IF TRIAGE NURSE FEELS PATIENT NEEDS TO BE SEEN WILL NEED TO GO TO URGENT CARE. INFORMED THE DAUGHTER THAT HE NEEDS TO BE SEEN AND THEY ARE SUGGESTING URGENT CARE. SHE STATES THAT SHE IS NOT GOING TO TAKE HIM TO URGENT CARE AS HE WAS IN THERE EARLIER AND THAT A RN LOOKED AT HIM AND SOMEONE SHOULD HAVE GOT THE MD TO LOOK AT [facility]. SHE IS VERY UPSET AND STATES THAT HE WILL NOT WAIT TOMORROW AT HIS APPT TIME. SHE STATES ALSO THAT NOTHING BETTER HAPPEN TO HIM OVERNIGHT. INSTRUCTED HER THAT HE DOES NEED TO BE SEEN AND THAT IS THE OUTCOME. SHE STATES THAT HE SHOULD HAVE BEEN SEEN WHILE IN THE  OFFICE EARLIER. SHE IS NOT GOING TO TAKE HIM TO ANOTHER CLINIC TODAY. SHE STATES THE OFFICE DOES NOT CLOSE UNTIL 1700 TODAY AND SHE COULD GET HIM  THERE. SHE IS VERY UPSET AND IS THREATENING LEGAL ACTION. WILL CLOSE THIS RECORD. Referrals GO TO FACILITY REFUSED

## 2014-06-21 NOTE — Progress Notes (Signed)
Subjective:    Patient ID: Ricardo Perkins, male    DOB: [birth year], 80 y.o.   MRN: [id number]  HPI Here with 2 mo onset tingling to all fingers for 2 mo, also noted on [facility]maid[facility] clinic on mon to have bluish tint to the fingers, and sometimes red, but pain.  No neck pain. No recent  b12 check.  No prior hx of PVD.  Sees Dr Blair Hailey, family very concerned about need to notify regarding med changes Past Medical History  Diagnosis Date  . INSOMNIA, PERSISTENT   . DECREASED HEARING   . HYPERTENSION   . BRADYCARDIA     1st degree AVB  . RENAL INSUFFICIENCY   . BENIGN PROSTATIC HYPERTROPHY, WITH OBSTRUCTION   . ASTEATOTIC ECZEMA   . LEG EDEMA, BILATERAL   . PAROXYSMAL ATRIAL FIBRILLATION     coumadin rx  . Arthritis     knees, shoulders   . COPD (chronic obstructive pulmonary disease)     on chronic oxygen therapy qhs>daytime  . Advanced age   . Hx of echocardiogram     a. echo in 2009 (in [place]): normal EF, mild LVH;   b. echo 10/13:  mid Ant HK, mod LVH, EF 55%, trivial AI, mod LAE, mild RVE, mild reduced RVSF, PASP 46  . Hx of cardiovascular stress test     a.  MV 4/09:  possible small area of lateral ischemia at the apex, but Dr. Kirk Ruths reviewed this, and in fact, felt it was most likely normal.  b.  Myoview 3/13 was again low risk with an EF of 62% with small reversible defect in the apex suggestive of very mild apical ischemia.  . Large bilateral inguinal hernias - chronic, containing small bowel (right) and sigmoid colon (left) 01/15/2011    s/p B repair  . OA (osteoarthritis) of knee     S/p right TKR March '13 (Dr. Wynelle Link)   . RHINITIS, VASOMOTOR 01/07/2010   Past Surgical History  Procedure Laterality Date  . Appendectomy      @ 23  . Tonsillectomy    . Hernia repair  01/2011    bilateral inguinal hernia   . Eye surgery      bilateral cataract surgery   . Other surgical history      left finger surgery   . Knee surgery  right  . Total knee arthroplasty   10/26/2011    Procedure: TOTAL KNEE ARTHROPLASTY;  Surgeon: Gearlean Alf, MD;  Location: [facility];  Service: Orthopedics;  Laterality: Right;    reports that he quit smoking about 6 years ago. He has never used smokeless tobacco. He reports that he drinks alcohol. He reports that he does not use illicit drugs. family history includes Cancer in his mother; Cirrhosis in his sister; Heart attack in his brother and father; Heart disease in his father. There is no history of Diabetes. No Known Allergies Current Outpatient Prescriptions on File Prior to Visit  Medication Sig Dispense Refill  . atenolol (TENORMIN) 25 MG tablet Take 0.5 tablets (12.5 mg total) by mouth 2 (two) times daily. 90 tablet 3  . finasteride (PROSCAR) 5 MG tablet Take 1 tablet (5 mg total) by mouth daily. 90 tablet 3  . furosemide (LASIX) 20 MG tablet Take 2 tablets (40 mg total) by mouth daily. TAKE ONE TABLET DAILY AS NEEDED 180 tablet 2  . omeprazole (PRILOSEC) 20 MG capsule TAKE ONE CAPSULE BY MOUTH EVERY DAY AS NEEDED 90 capsule 1  .  tamsulosin (FLOMAX) 0.4 MG CAPS capsule Take 1 capsule (0.4 mg total) by mouth daily after supper. 90 capsule 3  . temazepam (RESTORIL) 30 MG capsule TAKE ONE CAPSULE BY MOUTH AT BEDTIME 30 capsule 5  . warfarin (COUMADIN) 3 MG tablet Take as directed by anticoagulation clinic 110 tablet 3   No current facility-administered medications on file prior to visit.   Review of Systems  Constitutional: Negative for unusual diaphoresis or other sweats  HENT: Negative for ringing in ear Eyes: Negative for double vision or worsening visual disturbance.  Respiratory: Negative for choking and stridor.   Gastrointestinal: Negative for vomiting or other signifcant bowel change Genitourinary: Negative for hematuria or decreased urine volume.  Musculoskeletal: Negative for other MSK pain or swelling Skin: Negative for color change and worsening wound.  Neurological: Negative for tremors and numbness other  than noted  Psychiatric/Behavioral: Negative for decreased concentration or agitation other than above       Objective:   Physical Exam BP 160/100 mmHg  Pulse 54  Temp(Src) 97.8 F (36.6 C) (Oral)  Wt 186 lb 12 oz (84.709 kg)  SpO2 91% VS noted,  Constitutional: Pt appears well-developed, well-nourished.  HENT: Head: NCAT.  Right Ear: External ear normal.  Left Ear: External ear normal.  Eyes: . Pupils are equal, round, and reactive to light. Conjunctivae and EOM are normal Neck: Normal range of motion. Neck supple.  Cardiovascular: Normal rate and regular rhythm.   Pulmonary/Chest: Effort normal and breath sounds normal.  Neurological: Pt is alert. Not confused , motor grossly intact Skin: Skin is warm. No rash, but has some bluish discoloration to all fingers, thin skin, mild coolness o/w neurovasc intact Psychiatric: Pt behavior is normal. No agitation.     Assessment & Plan:

## 2014-06-21 NOTE — Patient Instructions (Signed)
Please take all new medication as prescribed  - the amlodipine 10 mg per day  (OK to stop the 5 mg dose)  I have sent an FYI message to Dr Stanford Breed noting this  Please continue all other medications as before, an  Please have the pharmacy call with any other refills you may need.  Please keep your appointments with your specialists as you may have planned

## 2014-06-21 NOTE — Telephone Encounter (Signed)
Spoke with pt dtr, Aware of dr crenshaw's recommendations.  

## 2014-06-21 NOTE — Progress Notes (Signed)
Pre visit review using our clinic review tool, if applicable. No additional management support is needed unless otherwise documented below in the visit note. 

## 2014-06-22 ENCOUNTER — Telehealth: Payer: Self-pay | Admitting: Internal Medicine

## 2014-06-22 NOTE — Telephone Encounter (Signed)
emmi mailed  °

## 2014-06-23 NOTE — Assessment & Plan Note (Addendum)
D/w pt, no assoc rheum dx apparently, for increased norvasc to 10 qd,  to f/u any worsening symptoms or concerns, consider rheum referral

## 2014-06-23 NOTE — Assessment & Plan Note (Signed)
Mild uncontrolled, also likekly to improve with increased norvasc,  to f/u any worsening symptoms or concerns BP Readings from Last 3 Encounters:  06/21/14 160/100  04/23/14 140/88  12/21/13 141/71

## 2014-06-23 NOTE — Assessment & Plan Note (Signed)
O/w stable, cont all other meds including coumadin

## 2014-07-03 ENCOUNTER — Telehealth: Payer: Self-pay | Admitting: Family

## 2014-07-03 ENCOUNTER — Ambulatory Visit (INDEPENDENT_AMBULATORY_CARE_PROVIDER_SITE_OTHER): Payer: Medicare Other

## 2014-07-03 DIAGNOSIS — Z5181 Encounter for therapeutic drug level monitoring: Secondary | ICD-10-CM

## 2014-07-03 DIAGNOSIS — I4891 Unspecified atrial fibrillation: Secondary | ICD-10-CM | POA: Diagnosis not present

## 2014-07-03 LAB — POCT INR: INR: 1.4

## 2014-07-03 NOTE — Telephone Encounter (Signed)
Plan reviewed with patient.  He stated understanding and agreed.

## 2014-07-03 NOTE — Telephone Encounter (Signed)
Recommend taking one whole tablet today and tomorrow and then continuing with plan described.

## 2014-07-10 ENCOUNTER — Ambulatory Visit (INDEPENDENT_AMBULATORY_CARE_PROVIDER_SITE_OTHER): Payer: Medicare Other

## 2014-07-10 DIAGNOSIS — I4891 Unspecified atrial fibrillation: Secondary | ICD-10-CM

## 2014-07-10 DIAGNOSIS — Z5181 Encounter for therapeutic drug level monitoring: Secondary | ICD-10-CM

## 2014-07-10 LAB — POCT INR: INR: 1.5

## 2014-07-17 ENCOUNTER — Ambulatory Visit (INDEPENDENT_AMBULATORY_CARE_PROVIDER_SITE_OTHER): Payer: Medicare Other | Admitting: Family Medicine

## 2014-07-17 DIAGNOSIS — I4891 Unspecified atrial fibrillation: Secondary | ICD-10-CM

## 2014-07-17 DIAGNOSIS — Z5181 Encounter for therapeutic drug level monitoring: Secondary | ICD-10-CM | POA: Diagnosis not present

## 2014-07-17 LAB — POCT INR: INR: 1.6

## 2014-07-31 ENCOUNTER — Ambulatory Visit (INDEPENDENT_AMBULATORY_CARE_PROVIDER_SITE_OTHER): Payer: Medicare Other | Admitting: Family Medicine

## 2014-07-31 DIAGNOSIS — Z5181 Encounter for therapeutic drug level monitoring: Secondary | ICD-10-CM

## 2014-07-31 DIAGNOSIS — I4891 Unspecified atrial fibrillation: Secondary | ICD-10-CM

## 2014-07-31 LAB — POCT INR: INR: 1.9

## 2014-08-14 ENCOUNTER — Ambulatory Visit (INDEPENDENT_AMBULATORY_CARE_PROVIDER_SITE_OTHER): Payer: Medicare Other

## 2014-08-14 DIAGNOSIS — I4891 Unspecified atrial fibrillation: Secondary | ICD-10-CM

## 2014-08-14 DIAGNOSIS — Z5181 Encounter for therapeutic drug level monitoring: Secondary | ICD-10-CM | POA: Diagnosis not present

## 2014-08-14 LAB — POCT INR: INR: 2.2

## 2014-08-24 ENCOUNTER — Telehealth: Payer: Self-pay

## 2014-08-24 NOTE — Telephone Encounter (Signed)
PA for temazepam started.

## 2014-09-11 ENCOUNTER — Ambulatory Visit (INDEPENDENT_AMBULATORY_CARE_PROVIDER_SITE_OTHER): Payer: Medicare Other | Admitting: General Practice

## 2014-09-11 DIAGNOSIS — I4891 Unspecified atrial fibrillation: Secondary | ICD-10-CM

## 2014-09-11 DIAGNOSIS — Z5181 Encounter for therapeutic drug level monitoring: Secondary | ICD-10-CM | POA: Diagnosis not present

## 2014-09-11 LAB — POCT INR: INR: 2.7

## 2014-09-11 NOTE — Progress Notes (Signed)
Pre visit review using our clinic review tool, if applicable. No additional management support is needed unless otherwise documented below in the visit note. 

## 2014-09-11 NOTE — Progress Notes (Signed)
Agree with plan 

## 2014-09-12 ENCOUNTER — Other Ambulatory Visit: Payer: Self-pay

## 2014-09-12 MED ORDER — TEMAZEPAM 30 MG PO CAPS: ORAL_CAPSULE | ORAL | Status: DC

## 2014-09-25 NOTE — Telephone Encounter (Signed)
PA approved for temazepam.

## 2014-10-01 ENCOUNTER — Other Ambulatory Visit: Payer: Self-pay | Admitting: Geriatric Medicine

## 2014-10-01 ENCOUNTER — Telehealth: Payer: Self-pay | Admitting: Internal Medicine

## 2014-10-01 NOTE — Telephone Encounter (Signed)
Cannot be filled. Refill too soon.

## 2014-10-01 NOTE — Telephone Encounter (Signed)
°  Pt daughter called in for refill on the temazepam (RESTORIL) 30 MG capsule [[id number]

## 2014-10-09 ENCOUNTER — Ambulatory Visit (INDEPENDENT_AMBULATORY_CARE_PROVIDER_SITE_OTHER): Payer: Medicare Other | Admitting: General Practice

## 2014-10-09 DIAGNOSIS — I4891 Unspecified atrial fibrillation: Secondary | ICD-10-CM | POA: Diagnosis not present

## 2014-10-09 DIAGNOSIS — Z5181 Encounter for therapeutic drug level monitoring: Secondary | ICD-10-CM | POA: Diagnosis not present

## 2014-10-09 LAB — POCT INR: INR: 3.6

## 2014-10-09 NOTE — Progress Notes (Signed)
Pre visit review using our clinic review tool, if applicable. No additional management support is needed unless otherwise documented below in the visit note. 

## 2014-10-09 NOTE — Progress Notes (Signed)
Agree with plan 

## 2014-10-30 ENCOUNTER — Ambulatory Visit (INDEPENDENT_AMBULATORY_CARE_PROVIDER_SITE_OTHER): Payer: Medicare Other | Admitting: General Practice

## 2014-10-30 DIAGNOSIS — I4891 Unspecified atrial fibrillation: Secondary | ICD-10-CM

## 2014-10-30 DIAGNOSIS — Z5181 Encounter for therapeutic drug level monitoring: Secondary | ICD-10-CM | POA: Diagnosis not present

## 2014-10-30 LAB — POCT INR: INR: 3

## 2014-10-30 NOTE — Progress Notes (Signed)
Agree with plan 

## 2014-10-30 NOTE — Progress Notes (Signed)
Pre visit review using our clinic review tool, if applicable. No additional management support is needed unless otherwise documented below in the visit note. 

## 2014-11-08 ENCOUNTER — Other Ambulatory Visit: Payer: Self-pay | Admitting: Internal Medicine

## 2014-11-08 NOTE — Telephone Encounter (Signed)
erx done today

## 2014-11-08 NOTE — Telephone Encounter (Signed)
Pt called about this refill and his is completely out.

## 2014-11-22 ENCOUNTER — Other Ambulatory Visit: Payer: Self-pay | Admitting: Internal Medicine

## 2014-11-27 ENCOUNTER — Ambulatory Visit (INDEPENDENT_AMBULATORY_CARE_PROVIDER_SITE_OTHER): Payer: Medicare Other | Admitting: General Practice

## 2014-11-27 DIAGNOSIS — Z5181 Encounter for therapeutic drug level monitoring: Secondary | ICD-10-CM

## 2014-11-27 DIAGNOSIS — I4891 Unspecified atrial fibrillation: Secondary | ICD-10-CM | POA: Diagnosis not present

## 2014-11-27 LAB — POCT INR: INR: 3.1

## 2014-11-27 NOTE — Progress Notes (Signed)
Pre visit review using our clinic review tool, if applicable. No additional management support is needed unless otherwise documented below in the visit note. 

## 2014-11-27 NOTE — Progress Notes (Signed)
Agree with plan 

## 2014-12-03 ENCOUNTER — Other Ambulatory Visit: Payer: Self-pay | Admitting: Internal Medicine

## 2014-12-25 ENCOUNTER — Ambulatory Visit (INDEPENDENT_AMBULATORY_CARE_PROVIDER_SITE_OTHER): Payer: Medicare Other | Admitting: *Deleted

## 2014-12-25 DIAGNOSIS — Z5181 Encounter for therapeutic drug level monitoring: Secondary | ICD-10-CM | POA: Diagnosis not present

## 2014-12-25 DIAGNOSIS — I4891 Unspecified atrial fibrillation: Secondary | ICD-10-CM

## 2014-12-25 LAB — POCT INR: INR: 3

## 2014-12-25 NOTE — Progress Notes (Signed)
I have reviewed and agree with the plan. 

## 2014-12-25 NOTE — Progress Notes (Signed)
Pre visit review using our clinic review tool, if applicable. No additional management support is needed unless otherwise documented below in the visit note. 

## 2014-12-30 ENCOUNTER — Other Ambulatory Visit: Payer: Self-pay | Admitting: Internal Medicine

## 2015-01-01 DIAGNOSIS — H40023 Open angle with borderline findings, high risk, bilateral: Secondary | ICD-10-CM | POA: Diagnosis not present

## 2015-01-02 ENCOUNTER — Other Ambulatory Visit: Payer: Self-pay | Admitting: Geriatric Medicine

## 2015-01-02 MED ORDER — WARFARIN SODIUM 3 MG PO TABS: ORAL_TABLET | ORAL | Status: DC

## 2015-01-14 ENCOUNTER — Encounter: Payer: Self-pay | Admitting: Internal Medicine

## 2015-01-14 ENCOUNTER — Ambulatory Visit (INDEPENDENT_AMBULATORY_CARE_PROVIDER_SITE_OTHER): Payer: Medicare Other | Admitting: Internal Medicine

## 2015-01-14 VITALS — BP 150/82 | HR 71 | Temp 97.8°F | Resp 20 | Wt 177.0 lb

## 2015-01-14 DIAGNOSIS — J449 Chronic obstructive pulmonary disease, unspecified: Secondary | ICD-10-CM

## 2015-01-14 DIAGNOSIS — R0902 Hypoxemia: Secondary | ICD-10-CM

## 2015-01-14 DIAGNOSIS — IMO0001 Reserved for inherently not codable concepts without codable children: Secondary | ICD-10-CM

## 2015-01-14 MED ORDER — PREDNISONE 10 MG PO TABS: ORAL_TABLET | ORAL | Status: DC

## 2015-01-14 MED ORDER — AMOXICILLIN 500 MG PO CAPS
500.0000 mg | ORAL_CAPSULE | Freq: Three times a day (TID) | ORAL | Status: DC
Start: 2015-01-14 — End: 2015-02-12

## 2015-01-14 NOTE — Progress Notes (Signed)
   Subjective:    Patient ID: Ricardo Perkins, male    DOB: [birth year], 81 y.o.   MRN: [id number]  HPI  He's had a cough and congestion for 2 days. The cough is nonproductive  & associated with a "tickle" in his throat. He also has some sneezing. He has no upper respiratory tract infection symptoms or other extrinsic symptoms.  He does have chronic edema but no other cardiovascular symptoms  Following his knee replacement he was on portable liquid oxygen . Because of lack of Medicare coverage this was discontinued. He's not been on the portable O2 for 2.5 years but uses stationary oxygen at night.     Review of Systems Frontal headache, facial pain , nasal purulence, gum pain, sore throat , otic pain or otic discharge denied. No fever , chills or sweats. Extrinsic symptoms of itchy/ watery eyes,  or angioedema are denied. There is no significant sputum production, wheezing,or  paroxysmal nocturnal dyspnea.  Chest pain, palpitations, tachycardia, exertional dyspnea, paroxysmal nocturnal dyspnea, or claudication  are absent.        Objective:   Physical Exam  Pertinent or positive findings include:  O2 sat 83% max. He has pattern alopecia. Beard and mustache present. He has complete dentures. Hearing aids present bilaterally. Accentuation of the second heart sound. No neck vein distention at 10. No hepatojugular reflux noted. Clubbing of the nailbeds noted. One half-1+ pitting edema. General appearance :adequately nourished; in no distress.  Eyes: No conjunctival inflammation or scleral icterus is present.  Oral exam:  Lips and gums are healthy appearing.There is no oropharyngeal erythema or exudate noted.  Heart:  Normal rate and regular rhythm. S1  normal without gallop, murmur, click, rub or other extra sounds    Lungs:Chest surprisingly clear to auscultation; no wheezes, rhonchi,rales ,or rubs present.No increased work of breathing.   Abdomen: bowel sounds normal, soft and  non-tender without masses, organomegaly or hernias noted.  No guarding or rebound.  Vascular : all pulses equal ; no bruits present.  Skin:Warm & dry.  Intact without suspicious lesions or rashes ; no tenting or jaundice   Lymphatic: No lymphadenopathy is noted about the head, neck, axilla  Neuro: Strength, tone decreased        Assessment & Plan:  #1 acute bronchitis  #2 profound hypoxemia  Plan: See orders. He should be on portable oxygen to prevent dysrhythmias. This may improve his peripheral edema.

## 2015-01-14 NOTE — Patient Instructions (Signed)
   Portable oxygen requested through [place]; please call if you have not been notified within 7 days.

## 2015-01-14 NOTE — Progress Notes (Signed)
Pre visit review using our clinic review tool, if applicable. No additional management support is needed unless otherwise documented below in the visit note. 

## 2015-01-15 ENCOUNTER — Telehealth: Payer: Self-pay | Admitting: Internal Medicine

## 2015-01-15 NOTE — Telephone Encounter (Signed)
Needs visit for eval, I have not seen since October.

## 2015-01-15 NOTE — Telephone Encounter (Signed)
Please advise, thanks.

## 2015-01-15 NOTE — Telephone Encounter (Signed)
Patient is needing portable oxygen evaluation.  Is requesting order for this.  Can either enter into Epic or fax to [phone].

## 2015-01-16 NOTE — Telephone Encounter (Signed)
Patient has scheduled appt for 7/1 at 1600

## 2015-01-18 ENCOUNTER — Ambulatory Visit (INDEPENDENT_AMBULATORY_CARE_PROVIDER_SITE_OTHER): Payer: Medicare Other | Admitting: Internal Medicine

## 2015-01-18 NOTE — Progress Notes (Signed)
Encounter opened in error

## 2015-01-22 ENCOUNTER — Ambulatory Visit (INDEPENDENT_AMBULATORY_CARE_PROVIDER_SITE_OTHER): Payer: Medicare Other | Admitting: General Practice

## 2015-01-22 DIAGNOSIS — I4891 Unspecified atrial fibrillation: Secondary | ICD-10-CM | POA: Diagnosis not present

## 2015-01-22 DIAGNOSIS — Z5181 Encounter for therapeutic drug level monitoring: Secondary | ICD-10-CM

## 2015-01-22 LAB — POCT INR: INR: 3.6

## 2015-01-22 NOTE — Progress Notes (Signed)
Pre visit review using our clinic review tool, if applicable. No additional management support is needed unless otherwise documented below in the visit note. 

## 2015-01-22 NOTE — Progress Notes (Signed)
I have reviewed and agree with the plan. 

## 2015-01-26 ENCOUNTER — Telehealth: Payer: Self-pay | Admitting: Physician Assistant

## 2015-01-26 ENCOUNTER — Other Ambulatory Visit: Payer: Self-pay | Admitting: Physician Assistant

## 2015-01-26 DIAGNOSIS — IMO0001 Reserved for inherently not codable concepts without codable children: Secondary | ICD-10-CM

## 2015-01-26 MED ORDER — GUAIFENESIN ER 600 MG PO TB12: 600.0000 mg | ORAL_TABLET | Freq: Two times a day (BID) | ORAL | Status: DC

## 2015-01-26 MED ORDER — PREDNISONE 10 MG PO TABS: ORAL_TABLET | ORAL | Status: DC

## 2015-01-26 MED ORDER — LEVOFLOXACIN 250 MG PO TABS: 250.0000 mg | ORAL_TABLET | Freq: Every day | ORAL | Status: DC

## 2015-01-26 NOTE — Telephone Encounter (Signed)
Patient has had a resurgence of his bronchitis, coughing more and becoming more short of breath.  His daughter tried to contact primary care but the answering service does not work and she was unable to get through.  I also tried to call the answering service and was unable to get in touch with anyone both to get this patient some help and to report the problem.  I restarted the steroid-induced and added Levaquin. I also put him on Mucinex. His daughter and I were in agreement that he needs to see primary care as soon as possible but the patient is completely refusing to go to the emergency room. He agrees to go see his primary care physician on Monday or Tuesday next week and she will facilitate this. She understands to call 911 if his condition worsens.  Lenoard Aden 01/26/2015 5:42 PM Beeper [phone]

## 2015-01-27 NOTE — Telephone Encounter (Signed)
Even though I did Pulmonary/CC for 20+ years ; I would refer him to Pulmonary if I were to to see him because of age , advanced disease & co-morbidities. He's your patient so I defer to you. [employer]

## 2015-01-28 ENCOUNTER — Ambulatory Visit (INDEPENDENT_AMBULATORY_CARE_PROVIDER_SITE_OTHER): Payer: Medicare Other | Admitting: Internal Medicine

## 2015-01-28 ENCOUNTER — Encounter: Payer: Self-pay | Admitting: Internal Medicine

## 2015-01-28 ENCOUNTER — Ambulatory Visit (INDEPENDENT_AMBULATORY_CARE_PROVIDER_SITE_OTHER)
Admission: RE | Admit: 2015-01-28 | Discharge: 2015-01-28 | Disposition: A | Discharge status: Home / Self Care | Payer: Medicare Other | Source: Ambulatory Visit | Attending: Internal Medicine | Admitting: Internal Medicine

## 2015-01-28 ENCOUNTER — Other Ambulatory Visit (INDEPENDENT_AMBULATORY_CARE_PROVIDER_SITE_OTHER): Payer: Medicare Other

## 2015-01-28 VITALS — BP 130/70 | HR 53 | Temp 97.5°F | Resp 18 | Wt 177.0 lb

## 2015-01-28 DIAGNOSIS — R05 Cough: Secondary | ICD-10-CM

## 2015-01-28 DIAGNOSIS — IMO0001 Reserved for inherently not codable concepts without codable children: Secondary | ICD-10-CM

## 2015-01-28 DIAGNOSIS — R059 Cough, unspecified: Secondary | ICD-10-CM

## 2015-01-28 DIAGNOSIS — N259 Disorder resulting from impaired renal tubular function, unspecified: Secondary | ICD-10-CM | POA: Diagnosis not present

## 2015-01-28 DIAGNOSIS — J449 Chronic obstructive pulmonary disease, unspecified: Secondary | ICD-10-CM

## 2015-01-28 DIAGNOSIS — R0602 Shortness of breath: Secondary | ICD-10-CM | POA: Diagnosis not present

## 2015-01-28 LAB — BASIC METABOLIC PANEL
BUN: 27 mg/dL — AB (ref 6–23)
CALCIUM: 9.4 mg/dL (ref 8.4–10.5)
CO2: 26 mEq/L (ref 19–32)
Chloride: 104 mEq/L (ref 96–112)
Creatinine, Ser: 1.21 mg/dL (ref 0.40–1.50)
GFR: 59.83 mL/min — ABNORMAL LOW (ref >60.00)
Glucose, Bld: 116 mg/dL — ABNORMAL HIGH (ref 70–99)
POTASSIUM: 4.2 meq/L (ref 3.5–5.1)
Sodium: 139 mEq/L (ref 135–145)

## 2015-01-28 LAB — CBC WITH DIFFERENTIAL/PLATELET
BASOS ABS: 0 10*3/uL (ref 0.0–0.1)
Basophils Relative: 0.1 % (ref 0.0–3.0)
EOS ABS: 0 10*3/uL (ref 0.0–0.7)
Eosinophils Relative: 0.1 % (ref 0.0–5.0)
HCT: 49.2 % (ref 39.0–52.0)
HEMOGLOBIN: 16.1 g/dL (ref 13.0–17.0)
Lymphs Abs: 1 10*3/uL (ref 0.7–4.0)
MCHC: 32.8 g/dL (ref 30.0–36.0)
MCV: 83.8 fl (ref 78.0–100.0)
MONO ABS: 0.3 10*3/uL (ref 0.1–1.0)
MONOS PCT: 2.4 % — AB (ref 3.0–12.0)
NEUTROS ABS: 11.6 10*3/uL — AB (ref 1.4–7.7)
Neutrophils Relative %: 90 % — ABNORMAL HIGH (ref 43.0–77.0)
Platelets: 223 10*3/uL (ref 150.0–400.0)
RBC: 5.87 Mil/uL — ABNORMAL HIGH (ref 4.22–5.81)
RDW: 17.9 % — AB (ref 11.5–15.5)
WBC: 12.9 10*3/uL — ABNORMAL HIGH (ref 4.0–10.5)

## 2015-01-28 NOTE — Progress Notes (Signed)
   Subjective:    Patient ID: Ricardo Perkins, male    DOB: [birth year], 81 y.o.   MRN: [id number]  HPI  He's had a cough since 6/27/15which is nonproductive. It is not disturbing sleep. He also has a burning/dry throat and a feeling of pressure and tightness in his chest. He also describes hoarseness. Exertional dyspnea is chronic.  He previously was seen in pulmonology;but  his primary care doctor took over management of his COPD related complaints.  He has no upper respiratory tract infection symptoms or extrinsic symptoms.  He is on 3 L of oxygen at home. He's not been able to obtain the portable system to date.  On 7/9 Levaquin and prednisone were prescribed by on call staff. He's been on this less than 48 hours and has not noted a dramatic improvement to date. Chest x-ray is not current; last images were in 2013.  He has had renal insufficiency in the past. Creatinine had improved from 1.6 on April 23, 2014 to 1.37 on 05/09/14. Review of Systems Frontal headache, facial pain , nasal purulence, dental pain, sore throat , otic pain or otic discharge denied. No fever but he describes some chills and sweats.  He denies itchy, watery eyes, or wheezing.     Objective:   Physical Exam Pertinent or positive findings include: He has a full beard and mustache. Pattern alopecia is present. Complete dentures are noted. He has bilateral hearing aids. Rales are noted in most lung fields, most significantly in the left lower lobe. Heart sounds are distant. He has 1+ edema to the midshin. Dorsalis pedis pulses are decreased. Varicosities are noted in the medial right lower extremity. Homans sign is negative.  General appearance :adequately nourished; in no distress.  Eyes: No conjunctival inflammation or scleral icterus is present.  Oral exam:  Lips and gums are healthy appearing.There is no oropharyngeal erythema or exudate noted.   Heart:  Normal rate and regular rhythm. S1 and S2 normal  without gallop, murmur, click, rub or other extra sounds     Abdomen: bowel sounds normal, soft and non-tender without masses, organomegaly or hernias noted.  No guarding or rebound. No HJR.  Vascular : all pulses equal ; no bruits present.  Skin:Warm & dry.  Intact without suspicious lesions or rashes ; no tenting or jaundice   Lymphatic: No lymphadenopathy is noted about the head, neck, axilla.   Neuro: Strength, tone decreased          Assessment & Plan:  #1 cough.  #2 advanced COPD.  #3 renal insufficiency.  Plan: See orders and recommendations

## 2015-01-28 NOTE — Patient Instructions (Signed)
  Your next office appointment will be determined based upon review of your pending labs  and  xrays  Those written interpretation of the lab results and instructions will be transmitted to you by My Chart OR by mail for your records.  Critical results will be called.   Followup as needed for any active or acute issue. Please report any significant change in your symptoms.

## 2015-01-28 NOTE — Progress Notes (Signed)
Pre visit review using our clinic review tool, if applicable. No additional management support is needed unless otherwise documented below in the visit note. 

## 2015-01-30 ENCOUNTER — Other Ambulatory Visit: Payer: Self-pay | Admitting: Internal Medicine

## 2015-01-30 ENCOUNTER — Other Ambulatory Visit (INDEPENDENT_AMBULATORY_CARE_PROVIDER_SITE_OTHER): Payer: Medicare Other

## 2015-01-30 DIAGNOSIS — R739 Hyperglycemia, unspecified: Secondary | ICD-10-CM

## 2015-01-30 DIAGNOSIS — IMO0001 Reserved for inherently not codable concepts without codable children: Secondary | ICD-10-CM

## 2015-01-30 DIAGNOSIS — R0902 Hypoxemia: Secondary | ICD-10-CM

## 2015-01-30 LAB — HEMOGLOBIN A1C: HEMOGLOBIN A1C: 6.4 % (ref 4.6–6.5)

## 2015-02-08 ENCOUNTER — Other Ambulatory Visit: Payer: Self-pay | Admitting: Internal Medicine

## 2015-02-12 ENCOUNTER — Encounter: Payer: Self-pay | Admitting: Internal Medicine

## 2015-02-12 ENCOUNTER — Ambulatory Visit (INDEPENDENT_AMBULATORY_CARE_PROVIDER_SITE_OTHER): Payer: Medicare Other | Admitting: Internal Medicine

## 2015-02-12 ENCOUNTER — Other Ambulatory Visit (INDEPENDENT_AMBULATORY_CARE_PROVIDER_SITE_OTHER): Payer: Medicare Other

## 2015-02-12 ENCOUNTER — Ambulatory Visit (INDEPENDENT_AMBULATORY_CARE_PROVIDER_SITE_OTHER)
Admission: RE | Admit: 2015-02-12 | Discharge: 2015-02-12 | Disposition: A | Discharge status: Home / Self Care | Payer: Medicare Other | Source: Ambulatory Visit | Attending: Internal Medicine | Admitting: Internal Medicine

## 2015-02-12 DIAGNOSIS — R0902 Hypoxemia: Secondary | ICD-10-CM

## 2015-02-12 DIAGNOSIS — J8410 Pulmonary fibrosis, unspecified: Secondary | ICD-10-CM | POA: Diagnosis not present

## 2015-02-12 DIAGNOSIS — J9809 Other diseases of bronchus, not elsewhere classified: Secondary | ICD-10-CM | POA: Diagnosis not present

## 2015-02-12 DIAGNOSIS — R911 Solitary pulmonary nodule: Secondary | ICD-10-CM

## 2015-02-12 DIAGNOSIS — I7300 Raynaud's syndrome without gangrene: Secondary | ICD-10-CM | POA: Diagnosis not present

## 2015-02-12 DIAGNOSIS — J432 Centrilobular emphysema: Secondary | ICD-10-CM | POA: Diagnosis not present

## 2015-02-12 LAB — SEDIMENTATION RATE: Sed Rate: 17 mm/hr (ref 0–22)

## 2015-02-12 LAB — CBC WITH DIFFERENTIAL/PLATELET
BASOS ABS: 0 10*3/uL (ref 0.0–0.1)
BASOS PCT: 0.4 % (ref 0.0–3.0)
Eosinophils Absolute: 0.1 10*3/uL (ref 0.0–0.7)
Eosinophils Relative: 1.9 % (ref 0.0–5.0)
HEMATOCRIT: 49.1 % (ref 39.0–52.0)
HEMOGLOBIN: 16.3 g/dL (ref 13.0–17.0)
LYMPHS ABS: 1.8 10*3/uL (ref 0.7–4.0)
Lymphocytes Relative: 28.8 % (ref 12.0–46.0)
MCHC: 33.3 g/dL (ref 30.0–36.0)
MCV: 84 fl (ref 78.0–100.0)
MONOS PCT: 8.3 % (ref 3.0–12.0)
Monocytes Absolute: 0.5 10*3/uL (ref 0.1–1.0)
NEUTROS PCT: 60.6 % (ref 43.0–77.0)
Neutro Abs: 3.8 10*3/uL (ref 1.4–7.7)
Platelets: 166 10*3/uL (ref 150.0–400.0)
RBC: 5.84 Mil/uL — AB (ref 4.22–5.81)
RDW: 17.8 % — ABNORMAL HIGH (ref 11.5–15.5)
WBC: 6.3 10*3/uL (ref 4.0–10.5)

## 2015-02-12 MED ORDER — PREDNISONE 10 MG PO TABS: ORAL_TABLET | ORAL | Status: DC

## 2015-02-12 NOTE — Patient Instructions (Addendum)
Pantoprazole (protonix) 40 mg   Take  30-60 min before first meal of the day and Pepcid (famotidine)  20 mg one @  bedtime until return to office - this is the best way to tell whether stomach acid is contributing to your problem.    GERD (REFLUX)  is an extremely common cause of respiratory symptoms just like yours , many times with no obvious heartburn at all.    It can be treated with medication, but also with lifestyle changes including elevation of the head of your bed (ideally with 6 inch  bed blocks),  Smoking cessation, avoidance of late meals, excessive alcohol, and avoid fatty foods, chocolate, peppermint, colas, red wine, and acidic juices such as orange juice.  NO MINT OR MENTHOL PRODUCTS SO NO COUGH DROPS  USE SUGARLESS CANDY INSTEAD (Jolley ranchers or Stover's or Life Savers) or even ice chips will also do - the key is to swallow to prevent all throat clearing. NO OIL BASED VITAMINS - use powdered substitutes.   Prednisone 10 x 2 each am until better then 1 each am with breakfast  Please see patient coordinator before you leave today  to schedule HRCTand ambulatory 02 titration   Please remember to go to the lab  department downstairs for your tests - we will call you with the results when they are available.  Please schedule a follow up office visit in 4 weeks, sooner if needed

## 2015-02-12 NOTE — Progress Notes (Signed)
Subjective:    Patient ID: Ricardo Perkins, male    DOB: [birth year],     MRN: [id number]  HPI  59 yowm quit smoking  Around 2000 p dx pna on noct 02 x 2008 with on 2.5 lpm pm not using with activity and not limited until winter 2016 since then gradually worse doe so referred to pulmonary clinic 02/12/2015 by Dr Linna Darner with previous pft 10/2007 s airflow obst and dlco 63% with CTa 2013 emphysema and mild PF.   02/12/2015 1st [facility] office visit/ Ricardo Perkins  / EPIC era  Chief Complaint  Patient presents with  . Pulmonary Consult    Referred by Dr. Linna Darner for eval of COPD. Pt seen here by Dr. Chase Caller in the past. Pt states that he is here so that he can obtain new POC. He states that he gets out of breath just walking from room to home at home.   prednisone helps arthritis not the breathing Indolent onset Doe x room to room gradually worse x 6 m assoc with dysphagia  Has not tried inhalers  No obvious day to day or daytime variability or assoc chronic cough or cp or chest tightness, subjective wheeze or overt sinus or hb symptoms. No unusual exp hx or h/o childhood pna/ asthma or knowledge of premature birth.  Sleeping ok without nocturnal  or early am exacerbation  of respiratory  c/o's or need for noct saba. Also denies any obvious fluctuation of symptoms with weather or environmental changes or other aggravating or alleviating factors except as outlined above   Current Medications, Allergies, Complete Past Medical History, Past Surgical History, Family History, and Social History were reviewed in [employer] record.       Review of Systems  Constitutional: Negative for fever, chills, activity change, appetite change and unexpected weight change.  HENT: Positive for trouble swallowing. Negative for congestion, dental problem, postnasal drip, rhinorrhea, sneezing, sore throat and voice change.   Eyes: Negative for visual disturbance.  Respiratory: Positive for  shortness of breath. Negative for cough and choking.   Cardiovascular: Negative for chest pain and leg swelling.  Gastrointestinal: Negative for nausea, vomiting and abdominal pain.  Genitourinary: Negative for difficulty urinating.       Acid heartburn  Musculoskeletal: Negative for arthralgias.  Skin: Negative for rash.  Psychiatric/Behavioral: Negative for behavioral problems and confusion.       Objective:   Physical Exam  amb mildly hoarse  Wt Readings from Last 3 Encounters:  02/12/15 177 lb (80.287 kg)  01/28/15 177 lb (80.287 kg)  01/18/15 177 lb (80.287 kg)    Vital signs reviewed   HEENT:  Edentulous/   nl   turbinates, and orophanx. Nl external ear canals without cough reflex   NECK :  without JVD/Nodes/TM/ nl carotid upstrokes bilaterally   LUNGS: no acc muscle use, clear to A and P bilaterally without cough on insp or exp maneuvers   CV:  RRR  no s3 or murmur or increase in P2, no edema   ABD:  soft and nontender with nl excursion in the supine position. No bruits or organomegaly, bowel sounds nl  MS:  warm without deformities, calf tenderness, cyanosis or clubbing  SKIN: warm and dry without lesions    NEURO:  alert, approp, no deficits     I personally reviewed images and agree with radiology impression as follows:  CXR:  01/28/15 Areas of fibrotic type change and underlying emphysema. No frank edema or  consolidation. No change in cardiac silhouette. Aortic tortuosity may reflect chronic hypertensive change./26/16   HRCT Chest  02/12/15  1. New 11 x 9 mm subpleural nodule in the periphery of the right lower lobe. Further evaluation with PET-CT is suggested at this time. 2. There are fibrotic changes which are most evident in the lower lobes of the lungs bilaterally, which appear essentially unchanged compared to prior examination 12/06/2011. Given their stability over the intervening 3 year time interval, findings are favored to reflect fibrotic  phase nonspecific interstitial pneumonia (NSIP), superimposed upon underlying emphysematous changes. Lack of temporal progression suggests against a more aggressive process such as usual interstitial pneumonia (UIP). 3. Mild diffuse bronchial wall thickening with mild centrilobular and moderate paraseptal emphysema; imaging findings suggestive of underlying COPD. 4. Dilatation of the pulmonic trunk (3.9 cm in diameter) suggesting associated pulmonary arterial hypertension.     Labs ordered/ reviewed:   Lab 02/12/15 1446  HGB 16.3  HCT 49.1  WBC 6.3  PLT 166.0        Lab Results  Component Value Date   ESRSEDRATE 17 02/12/2015               Assessment & Plan:

## 2015-02-13 ENCOUNTER — Telehealth: Payer: Self-pay | Admitting: Internal Medicine

## 2015-02-13 ENCOUNTER — Encounter: Payer: Self-pay | Admitting: Internal Medicine

## 2015-02-13 MED ORDER — PANTOPRAZOLE SODIUM 40 MG PO TBEC: 40.0000 mg | DELAYED_RELEASE_TABLET | Freq: Every day | ORAL | Status: DC

## 2015-02-13 NOTE — Telephone Encounter (Signed)
Rx has been sent in. Pt is aware. Nothing further was needed. 

## 2015-02-13 NOTE — Progress Notes (Signed)
Quick Note:  Spoke with pt and notified of results per Dr. Wert. Pt verbalized understanding and denied any questions.  ______ 

## 2015-02-13 NOTE — Progress Notes (Signed)
Quick Note:  LMTCB ______ 

## 2015-02-13 NOTE — Assessment & Plan Note (Addendum)
-  PFTs  11/04/07  VC 3.50 (97%) s obst  dlco 63 corrects to 79% - 02/12/2015  Walked RA  2 laps @ 185 ft each stopped due to  desat to 81% and sob/ same on 2lpm with sat 88%  - 02/12/2015  Walked 3lpm x  2 laps @ 185 ft much less sob and no desat   - HRCT 02/12/2015 > c/w NSIP but ESR 17   DDx for pulmonary fibrosis  includes idiopathic pulmonary fibrosis, pulmonary fibrosis associated with rheumatologic diseases (which have a relatively benign course in most cases) , adverse effect from  drugs such as chemotherapy or amiodarone exposure, nonspecific interstitial pneumonia which is typically steroid responsive, and chronic hypersensitivity pneumonitis.   In active  smokers Langerhan's Cell  Histiocyctosis (eosinophilic granuomatosis),  DIP,  and Respiratory Bronchiolitis ILD also need to be considered,  But likey do not apply here.   Use of PPI is associated with improved survival time and with decreased radiologic fibrosis per King's study published in AJRCCM vol 184 p1390.  Dec 2011 and also may have other beneficial effects as per the latest review in [place] vol 193 J4099 Jun 20016.  This may not always be cause and effect, but given how universally unimpressive and expensive  all the other  Drugs developed to day  have been for pf,   rec start  rx ppi / diet/ lifestyle modification and f/u with serial walking sats and lung volumes for now to put more points on the curve / establish firm baseline before considering additional measures.

## 2015-02-14 ENCOUNTER — Encounter: Payer: Self-pay | Admitting: Internal Medicine

## 2015-02-14 NOTE — Assessment & Plan Note (Signed)
CT s contrast 02/12/15 New 11 x 9 mm subpleural nodule in the periphery of the right lower lobe vs prev CT 11/2011   Given that he already has sign lung dz at age 81 is not a good candidate for resection but will discuss all the risks/benefits on next ov of watching this conservatively vs attempting bx (which would include PET first)

## 2015-02-14 NOTE — Assessment & Plan Note (Signed)
-   02/12/2015  Walked RA  2 laps @ 185 ft each stopped due to  desat to 81% and sob/ same on 2lpm with sat 88%  - 02/12/2015  Walked 3lpm   2 laps @ 185 ft much less sob and no desat  > referred for POC eligibility   Ok to leave off at rest but more than room to room should use 3lpm to prevent desat

## 2015-02-19 ENCOUNTER — Ambulatory Visit (INDEPENDENT_AMBULATORY_CARE_PROVIDER_SITE_OTHER): Payer: Medicare Other | Admitting: General Practice

## 2015-02-19 DIAGNOSIS — Z5181 Encounter for therapeutic drug level monitoring: Secondary | ICD-10-CM

## 2015-02-19 DIAGNOSIS — I4891 Unspecified atrial fibrillation: Secondary | ICD-10-CM

## 2015-02-19 LAB — POCT INR: INR: 3.1

## 2015-02-19 NOTE — Progress Notes (Signed)
I have reviewed and agree with the plan. 

## 2015-02-19 NOTE — Progress Notes (Signed)
Pre visit review using our clinic review tool, if applicable. No additional management support is needed unless otherwise documented below in the visit note. 

## 2015-03-12 ENCOUNTER — Encounter: Payer: Self-pay | Admitting: Internal Medicine

## 2015-03-12 ENCOUNTER — Telehealth: Payer: Self-pay | Admitting: Internal Medicine

## 2015-03-12 ENCOUNTER — Ambulatory Visit (INDEPENDENT_AMBULATORY_CARE_PROVIDER_SITE_OTHER): Payer: Medicare Other | Admitting: Internal Medicine

## 2015-03-12 VITALS — BP 110/70 | HR 56 | Ht 67.0 in | Wt 181.8 lb

## 2015-03-12 DIAGNOSIS — R0902 Hypoxemia: Secondary | ICD-10-CM | POA: Diagnosis not present

## 2015-03-12 DIAGNOSIS — R911 Solitary pulmonary nodule: Secondary | ICD-10-CM

## 2015-03-12 DIAGNOSIS — J8410 Pulmonary fibrosis, unspecified: Secondary | ICD-10-CM

## 2015-03-12 MED ORDER — FAMOTIDINE 20 MG PO TABS: ORAL_TABLET | ORAL | Status: DC

## 2015-03-12 NOTE — Progress Notes (Signed)
Subjective:    Patient ID: Ricardo Perkins, male    DOB: 11/09/1922,     MRN: [id number]  HPI  39 yowm quit smoking  Around 2000 p dx pna on noct 02 x 2008 with on 2.5 lpm pm not using with activity and not limited until winter 2016 since then gradually worse doe so referred to pulmonary clinic 02/12/2015 by Dr Linna Darner with previous pft 10/2007 s airflow obst and dlco 63% with CTa 2013 emphysema and mild PF.   02/12/2015 1st [facility] office visit/ Binh Doten  / EPIC era  Chief Complaint  Patient presents with  . Pulmonary Consult    Referred by Dr. Linna Darner for eval of COPD. Pt seen here by Dr. Chase Caller in the past. Pt states that he is here so that he can obtain new POC. He states that he gets out of breath just walking from room to home at home.   prednisone helps arthritis not the breathing Indolent onset Doe x room to room gradually worse x 6 m assoc with dysphagia  Has not tried inhalers rec Pantoprazole (protonix) 40 mg   Take  30-60 min before first meal of the day and Pepcid (famotidine)  20 mg one @  bedtime   GERD  Diet  Prednisone 10 x 2 each am until better then 1 each am with breakfast Please see patient coordinator before you leave today  to schedule HRCT   03/12/2015 f/u ov/Puneet Selden re: PF/ did not start ppi but took pred x 4 days @ 20 then better then down to 10 mg daily  Chief Complaint  Patient presents with  . Follow-up    Pt states that breathing is unchanged since last visit. Pt states using O2 at home PRN when he is SOB. No new complaints voiced today      breathing and arthritis better on pred 20 so reduced to 10 mg daily / does not have amb 02   No obvious day to day or daytime variability or assoc chronic cough or cp or chest tightness, subjective wheeze or overt sinus or hb symptoms. No unusual exp hx or h/o childhood pna/ asthma or knowledge of premature birth.  Sleeping ok without nocturnal  or early am exacerbation  of respiratory  c/o's or need for noct saba.  Also denies any obvious fluctuation of symptoms with weather or environmental changes or other aggravating or alleviating factors except as outlined above   Current Medications, Allergies, Complete Past Medical History, Past Surgical History, Family History, and Social History were reviewed in [employer] record.  ROS  The following are not active complaints unless bolded sore throat, dysphagia, dental problems, itching, sneezing,  nasal congestion or excess/ purulent secretions, ear ache,   fever, chills, sweats, unintended wt loss, classically pleuritic or exertional cp, hemoptysis,  orthopnea pnd or leg swelling, presyncope, palpitations, abdominal pain, anorexia, nausea, vomiting, diarrhea  or change in bowel or bladder habits, change in stools or urine, dysuria,hematuria,  rash, arthralgias, visual complaints, headache, numbness, weakness or ataxia or problems with walking or coordination,  change in mood/affect or memory.             Objective:   Physical Exam  amb mildly hoarse  03/12/2015       181 Wt Readings from Last 3 Encounters:  02/12/15 177 lb (80.287 kg)  01/28/15 177 lb (80.287 kg)  01/18/15 177 lb (80.287 kg)    Vital signs reviewed   HEENT:  Edentulous/  nl   turbinates, and orophanx. Nl external ear canals without cough reflex   NECK :  without JVD/Nodes/TM/ nl carotid upstrokes bilaterally   LUNGS: no acc muscle use, clear to A and P bilaterally without cough on insp or exp maneuvers   CV:  RRR  no s3 or murmur or increase in P2, no edema   ABD:  soft and nontender with nl excursion in the supine position. No bruits or organomegaly, bowel sounds nl  MS:  warm without deformities, calf tenderness, cyanosis or clubbing  SKIN: warm and dry without lesions    NEURO:  alert, approp, no deficits     I personally reviewed images and agree with radiology impression as follows:  CXR:  01/28/15 Areas of fibrotic type change and  underlying emphysema. No frank edema or consolidation. No change in cardiac silhouette. Aortic tortuosity may reflect chronic hypertensive change./26/16   HRCT Chest  02/12/15  1. New 11 x 9 mm subpleural nodule in the periphery of the right lower lobe. Further evaluation with PET-CT is suggested at this time. 2. There are fibrotic changes which are most evident in the lower lobes of the lungs bilaterally, which appear essentially unchanged compared to prior examination 12/06/2011. Given their stability over the intervening 3 year time interval, findings are favored to reflect fibrotic phase nonspecific interstitial pneumonia (NSIP), superimposed upon underlying emphysematous changes. Lack of temporal progression suggests against a more aggressive process such as usual interstitial pneumonia (UIP). 3. Mild diffuse bronchial wall thickening with mild centrilobular and moderate paraseptal emphysema; imaging findings suggestive of underlying COPD. 4. Dilatation of the pulmonic trunk (3.9 cm in diameter) suggesting associated pulmonary arterial hypertension.     Labs  reviewed:   Lab 02/12/15 1446  HGB 16.3  HCT 49.1  WBC 6.3  PLT 166.0        Lab Results  Component Value Date   ESRSEDRATE 17 02/12/2015               Assessment & Plan:

## 2015-03-12 NOTE — Assessment & Plan Note (Signed)
-   02/12/2015  Walked RA  2 laps @ 185 ft each stopped due to  desat to 81% and sob/ same on 2lpm with sat 88%  - 02/12/2015  Walked 3lpm   2 laps @ 185 ft much less sob and no desat  > referred for POC eligibility  - 03/12/2015   Walked RA x one lap @ 185 stopped due to  desat to 83 at nl pace > improved but not corrected on 2lpm so rec 3lpm and poc re-eval

## 2015-03-12 NOTE — Assessment & Plan Note (Signed)
CT s contrast 02/12/15 New 11 x 9 mm subpleural nodule in the periphery of the right lower lobe vs prev CT 11/2011  - in computer for recall 08/15/15   Given severity of lung dz nothing to offer for now but serial ct - Discussed in detail all the  indications, usual  risks and alternatives  relative to the benefits with patient who agrees to proceed with conservative f/u as outlined

## 2015-03-12 NOTE — Assessment & Plan Note (Addendum)
-  PFTs  11/04/07  VC 3.50 (97%) s obst  dlco 63 corrects to 79% - Hypoxemia on exertion > see sep a/p  - HRCT 02/12/2015 > c/w NSIP but ESR 17 rx pred 20 x 4 days then 10 mg daily   I had an extended discussion with the patient reviewing all relevant studies completed to date and  lasting 15 to 20 minutes of a 25 minute visit    1) he did not follow last set of instructions re gerd rx or use of amb 02 (see ex hypoxemia sep a/p)  2) Pantoprazole (protonix) 40 mg   Take  30-60 min before first meal of the day and Pepcid (famotidine)  20 mg one @  bedtime until return to office - this is the best way to tell whether stomach acid is contributing to your problem.    3) Use of PPI is associated with improved survival time and with decreased radiologic fibrosis per King's study published in AJRCCM vol 184 p1390.  Dec 2011 and also may have other beneficial effects as per the latest review in [place] vol 193 J6283 Jun 20016.  4) The goal with a chronic steroid dependent illness is always arriving at the lowest effective dose that controls the disease/symptoms and not accepting a set "formula" which is based on statistics or guidelines that don't always take into account patient  variability or the natural hx of the dz in every individual patient, which may well vary over time.  For now therefore I recommend the patient maintain  A floor of 10 mg per day    This may not always be cause and effect, but given how universally unimpressive and expensive  all the other  Drugs developed to day  have been for pf,   rec start  rx ppi / diet/ lifestyle modification and f/u with serial walking sats and lung volumes for now to put more points on the curve / establish firm baseline before considering additional measures.   Each maintenance medication was reviewed in detail including most importantly the difference between maintenance and prns and under what circumstances the prns are to be triggered using an action plan  format that is not reflected in the computer generated alphabetically organized AVS.    Please see instructions for details which were reviewed in writing and the patient given a copy highlighting the part that I personally wrote and discussed at today's ov.

## 2015-03-12 NOTE — Telephone Encounter (Signed)
Per Melissa,  Have not been able to contact patient to schedule POC evaluation  FYI to Dr. Chase Caller

## 2015-03-12 NOTE — Patient Instructions (Addendum)
Please see patient coordinator before you leave today  to schedule portable 02 at 3lpm walking outside house, ok to leave off at home except use at bedtime   Pantoprazole (protonix) 40 mg   Take  30-60 min before first meal of the day and Pepcid (famotidine)  20 mg one @  bedtime until return to office - this is the best way to tell whether stomach acid is contributing to your problem.    GERD (REFLUX)  is an extremely common cause of respiratory symptoms just like yours , many times with no obvious heartburn at all.    It can be treated with medication, but also with lifestyle changes including elevation of the head of your bed (ideally with 6 inch  bed blocks),  Smoking cessation, avoidance of late meals, excessive alcohol, and avoid fatty foods, chocolate, peppermint, colas, red wine, and acidic juices such as orange juice.  NO MINT OR MENTHOL PRODUCTS SO NO COUGH DROPS  USE SUGARLESS CANDY INSTEAD (Jolley ranchers or Stover's or Life Savers) or even ice chips will also do - the key is to swallow to prevent all throat clearing. NO OIL BASED VITAMINS - use powdered substitutes.    Continue prednisone at 10 mg daily   Please schedule a follow up office visit in 6 weeks, call sooner if needed with all medications in hand and with your portable 02 system so we can verify it's working right

## 2015-03-19 ENCOUNTER — Ambulatory Visit (INDEPENDENT_AMBULATORY_CARE_PROVIDER_SITE_OTHER): Payer: Medicare Other | Admitting: General Practice

## 2015-03-19 DIAGNOSIS — Z5181 Encounter for therapeutic drug level monitoring: Secondary | ICD-10-CM

## 2015-03-19 DIAGNOSIS — I4891 Unspecified atrial fibrillation: Secondary | ICD-10-CM | POA: Diagnosis not present

## 2015-03-19 LAB — POCT INR: INR: 2.8

## 2015-03-19 NOTE — Progress Notes (Signed)
Pre visit review using our clinic review tool, if applicable. No additional management support is needed unless otherwise documented below in the visit note. 

## 2015-03-19 NOTE — Progress Notes (Signed)
I have reviewed and agree with the plan. 

## 2015-03-20 DIAGNOSIS — S8002XA Contusion of left knee, initial encounter: Secondary | ICD-10-CM | POA: Diagnosis not present

## 2015-04-04 ENCOUNTER — Other Ambulatory Visit: Payer: Self-pay | Admitting: Internal Medicine

## 2015-04-16 ENCOUNTER — Ambulatory Visit (INDEPENDENT_AMBULATORY_CARE_PROVIDER_SITE_OTHER): Payer: Medicare Other | Admitting: General Practice

## 2015-04-16 DIAGNOSIS — I4891 Unspecified atrial fibrillation: Secondary | ICD-10-CM

## 2015-04-16 DIAGNOSIS — Z5181 Encounter for therapeutic drug level monitoring: Secondary | ICD-10-CM

## 2015-04-16 LAB — POCT INR: INR: 4.3

## 2015-04-16 NOTE — Progress Notes (Signed)
Pre visit review using our clinic review tool, if applicable. No additional management support is needed unless otherwise documented below in the visit note. 

## 2015-04-16 NOTE — Progress Notes (Signed)
I have reviewed and agree with the plan. 

## 2015-04-22 ENCOUNTER — Telehealth: Payer: Self-pay | Admitting: Internal Medicine

## 2015-04-22 ENCOUNTER — Other Ambulatory Visit: Payer: Self-pay | Admitting: Emergency Medicine

## 2015-04-22 ENCOUNTER — Ambulatory Visit (INDEPENDENT_AMBULATORY_CARE_PROVIDER_SITE_OTHER): Payer: Medicare Other | Admitting: Internal Medicine

## 2015-04-22 ENCOUNTER — Other Ambulatory Visit (INDEPENDENT_AMBULATORY_CARE_PROVIDER_SITE_OTHER): Payer: Medicare Other

## 2015-04-22 ENCOUNTER — Encounter: Payer: Self-pay | Admitting: Internal Medicine

## 2015-04-22 ENCOUNTER — Ambulatory Visit (HOSPITAL_COMMUNITY)
Admission: RE | Admit: 2015-04-22 | Discharge: 2015-04-22 | Disposition: A | Discharge status: Home / Self Care | Payer: Medicare Other | Source: Ambulatory Visit | Attending: Internal Medicine | Admitting: Internal Medicine

## 2015-04-22 VITALS — BP 136/84 | HR 81 | Temp 97.7°F | Resp 18 | Wt 187.0 lb

## 2015-04-22 DIAGNOSIS — R0602 Shortness of breath: Secondary | ICD-10-CM | POA: Diagnosis not present

## 2015-04-22 DIAGNOSIS — L309 Dermatitis, unspecified: Secondary | ICD-10-CM | POA: Diagnosis not present

## 2015-04-22 DIAGNOSIS — Z23 Encounter for immunization: Secondary | ICD-10-CM | POA: Diagnosis not present

## 2015-04-22 DIAGNOSIS — I10 Essential (primary) hypertension: Secondary | ICD-10-CM | POA: Insufficient documentation

## 2015-04-22 DIAGNOSIS — R0902 Hypoxemia: Secondary | ICD-10-CM

## 2015-04-22 DIAGNOSIS — R0781 Pleurodynia: Secondary | ICD-10-CM | POA: Diagnosis not present

## 2015-04-22 DIAGNOSIS — R600 Localized edema: Secondary | ICD-10-CM

## 2015-04-22 DIAGNOSIS — R079 Chest pain, unspecified: Secondary | ICD-10-CM | POA: Diagnosis not present

## 2015-04-22 LAB — CBC WITH DIFFERENTIAL/PLATELET
BASOS PCT: 0.9 % (ref 0.0–3.0)
Basophils Absolute: 0 10*3/uL (ref 0.0–0.1)
EOS PCT: 0.6 % (ref 0.0–5.0)
Eosinophils Absolute: 0 10*3/uL (ref 0.0–0.7)
HEMATOCRIT: 47.4 % (ref 39.0–52.0)
HEMOGLOBIN: 15.7 g/dL (ref 13.0–17.0)
LYMPHS PCT: 18.7 % (ref 12.0–46.0)
Lymphs Abs: 1.1 10*3/uL (ref 0.7–4.0)
MCHC: 33 g/dL (ref 30.0–36.0)
MCV: 89.9 fl (ref 78.0–100.0)
MONOS PCT: 8.5 % (ref 3.0–12.0)
Monocytes Absolute: 0.5 10*3/uL (ref 0.1–1.0)
NEUTROS ABS: 4.1 10*3/uL (ref 1.4–7.7)
Neutrophils Relative %: 71.3 % (ref 43.0–77.0)
PLATELETS: 166 10*3/uL (ref 150.0–400.0)
RBC: 5.27 Mil/uL (ref 4.22–5.81)
RDW: 19.3 % — AB (ref 11.5–15.5)
WBC: 5.8 10*3/uL (ref 4.0–10.5)

## 2015-04-22 LAB — BASIC METABOLIC PANEL
BUN: 24 mg/dL — ABNORMAL HIGH (ref 6–23)
CHLORIDE: 103 meq/L (ref 96–112)
CO2: 27 meq/L (ref 19–32)
CREATININE: 1.2 mg/dL (ref 0.40–1.50)
Calcium: 9.5 mg/dL (ref 8.4–10.5)
GFR: 60.38 mL/min (ref >60.00)
Glucose, Bld: 89 mg/dL (ref 70–99)
POTASSIUM: 3.8 meq/L (ref 3.5–5.1)
SODIUM: 142 meq/L (ref 135–145)

## 2015-04-22 MED ORDER — TRAMADOL HCL 50 MG PO TABS: 50.0000 mg | ORAL_TABLET | Freq: Three times a day (TID) | ORAL | Status: DC | PRN

## 2015-04-22 NOTE — Progress Notes (Signed)
   Subjective:    Patient ID: Ricardo Perkins, male    DOB: [birth year], 81 y.o.   MRN: [id number]  HPI He has had pain in the right inferior thoracic area in the mid axillary line for 2 weeks. There was no injury or trigger for this. He had fallen 03/21/15 but struck his left knee. There is marked swelling; he was seen by the orthopedist for this.  He's been using a heating pad with some relief.  Deep breathing or cough does make it worse.  His dyspnea is described as stable yet his O2 sats can be 79% with exertion when he is off oxygen.   He is on warfarin and the PT/INR was 4.0 last week.   Review of Systems  There is no significant cough, sputum production,hemoptysis, wheezing,or  paroxysmal nocturnal dyspnea. Unexplained weight loss, abdominal pain, significant dyspepsia, dysphagia, melena, rectal bleeding, or persistently small caliber stools are not present. Dysuria, pyuria, hematuria, frequency, nocturia or polyuria are denied.    Objective:   Physical Exam  Pertinent or positive findings include: Despite bilateral hearing aids, auditory acuity is still decreased. He has complete dentures. He has a beard and mustache. He has minor rales at the right inferior chest. 2+ pitting edema is noted over the lower extremities especially on the left. He has large varicose veins over the right medial calf superiorly. Homans sign is negative. There is resolving subcutaneous hematoma without ecchymosis over the left knee. There is a linear erythematous rash over the right inferior thorax which blanches with pressure. No vesicles or eschar. Over the right posterior inferior flank he has a 9X7 cm irregular faint erythematous papules which also blanch with pressure.  General appearance :adequately nourished; in no distress.  Eyes: No conjunctival inflammation or scleral icterus is present.  Oral exam:  Lips and gums are healthy appearing.There is no oropharyngeal erythema or exudate noted.   Heart:   Normal rate and regular rhythm. S1 and S2 normal without gallop, murmur, click, rub or other extra sounds    Lungs:Chest clear to auscultation; no wheezes, rhonchi,rales ,or rubs present.No increased work of breathing.   Abdomen: bowel sounds normal, soft and non-tender without masses, organomegaly or hernias noted.  No guarding or rebound.   Vascular : all pulses equal ; no bruits present.  Skin:Warm & dry.  Intact without suspicious lesions or rashes ; no tenting or jaundice   Lymphatic: No lymphadenopathy is noted about the head, neck, axilla.   Neuro: Strength, tone decreased.      Assessment & Plan:  #1 right pleuritic chest pain  #2 profound hypoxemia with exertion  #3 severe edema. Deep venous thrombosis is not likely with a PT/INR of 4.0.  #4 vasculitic rash; clinically there is no evidence of zoster.

## 2015-04-22 NOTE — Patient Instructions (Signed)
Use an anti-inflammatory cream such as Aspercreme or Zostrix cream twice a day to the affected area as needed. In lieu of this warm moist compresses or  hot water bottle can be used. Do not apply ice .  Tramadol one half-1 pill every 6-8 hours as needed for the pain.   Your next office appointment will be determined based upon review of your pending labs  and  xrays  Those written interpretation of the lab results and instructions will be transmitted to you by My Chart   Critical results will be called.   Followup as needed for any active or acute issue. Please report any significant change in your symptoms.

## 2015-04-22 NOTE — Telephone Encounter (Signed)
Pt called stated that he need refill for Tramadol HCL '50mg'$  to be send to [facility], pt has 1 pill left. Please help

## 2015-04-22 NOTE — Progress Notes (Signed)
Pre visit review using our clinic review tool, if applicable. No additional management support is needed unless otherwise documented below in the visit note. 

## 2015-04-22 NOTE — Telephone Encounter (Signed)
#  30  1 every 8 hours as needed

## 2015-04-22 NOTE — Telephone Encounter (Signed)
Please advise, not on pts med list.

## 2015-04-23 ENCOUNTER — Ambulatory Visit: Payer: Medicare Other | Admitting: Internal Medicine

## 2015-04-24 ENCOUNTER — Other Ambulatory Visit: Payer: Self-pay | Admitting: Emergency Medicine

## 2015-04-24 MED ORDER — TRAMADOL HCL 50 MG PO TABS: 50.0000 mg | ORAL_TABLET | Freq: Three times a day (TID) | ORAL | Status: DC | PRN

## 2015-04-24 NOTE — Telephone Encounter (Signed)
Tramadol RX faxed to pharm.

## 2015-04-25 ENCOUNTER — Other Ambulatory Visit: Payer: Self-pay | Admitting: Internal Medicine

## 2015-04-25 DIAGNOSIS — B029 Zoster without complications: Secondary | ICD-10-CM

## 2015-04-25 MED ORDER — FAMCICLOVIR 500 MG PO TABS: 500.0000 mg | ORAL_TABLET | Freq: Three times a day (TID) | ORAL | Status: DC

## 2015-04-25 MED ORDER — GABAPENTIN 100 MG PO CAPS: ORAL_CAPSULE | ORAL | Status: DC

## 2015-05-09 ENCOUNTER — Other Ambulatory Visit: Payer: Self-pay | Admitting: Internal Medicine

## 2015-05-13 ENCOUNTER — Other Ambulatory Visit: Payer: Self-pay | Admitting: Internal Medicine

## 2015-05-13 ENCOUNTER — Telehealth: Payer: Self-pay | Admitting: Internal Medicine

## 2015-05-13 MED ORDER — PANTOPRAZOLE SODIUM 40 MG PO TBEC: 40.0000 mg | DELAYED_RELEASE_TABLET | Freq: Every day | ORAL | Status: DC

## 2015-05-13 NOTE — Telephone Encounter (Signed)
Patients daughter called in requesting refill for traMADol (ULTRAM) 50 MG tablet [[id number] for his shingles. She asked I ask Dr. Linna Darner since he was the last to prescribe Pharmacy is [facility] on [address]

## 2015-05-13 NOTE — Telephone Encounter (Signed)
Please advise 

## 2015-05-14 ENCOUNTER — Other Ambulatory Visit: Payer: Self-pay | Admitting: Emergency Medicine

## 2015-05-14 ENCOUNTER — Ambulatory Visit (INDEPENDENT_AMBULATORY_CARE_PROVIDER_SITE_OTHER): Payer: Medicare Other | Admitting: General Practice

## 2015-05-14 DIAGNOSIS — I4891 Unspecified atrial fibrillation: Secondary | ICD-10-CM | POA: Diagnosis not present

## 2015-05-14 DIAGNOSIS — Z5181 Encounter for therapeutic drug level monitoring: Secondary | ICD-10-CM | POA: Diagnosis not present

## 2015-05-14 LAB — POCT INR: INR: 2.5

## 2015-05-14 MED ORDER — TRAMADOL HCL 50 MG PO TABS: 50.0000 mg | ORAL_TABLET | Freq: Three times a day (TID) | ORAL | Status: DC | PRN

## 2015-05-14 NOTE — Progress Notes (Signed)
I have reviewed and agree with the plan. 

## 2015-05-14 NOTE — Telephone Encounter (Signed)
OK X1 

## 2015-05-14 NOTE — Progress Notes (Signed)
Pre visit review using our clinic review tool, if applicable. No additional management support is needed unless otherwise documented below in the visit note. 

## 2015-05-18 ENCOUNTER — Other Ambulatory Visit: Payer: Self-pay | Admitting: Internal Medicine

## 2015-05-31 ENCOUNTER — Other Ambulatory Visit: Payer: Self-pay | Admitting: Internal Medicine

## 2015-05-31 MED ORDER — TRAMADOL HCL 50 MG PO TABS: 50.0000 mg | ORAL_TABLET | Freq: Three times a day (TID) | ORAL | Status: DC | PRN

## 2015-05-31 NOTE — Addendum Note (Signed)
Addended by: Levonne Lapping on: 05/31/2015 10:38 AM   Modules accepted: Orders

## 2015-05-31 NOTE — Telephone Encounter (Signed)
faxed

## 2015-05-31 NOTE — Telephone Encounter (Signed)
Ok to fill 

## 2015-05-31 NOTE — Telephone Encounter (Signed)
Patient needing a refill for traMADol (ULTRAM) 50 MG tablet [[id number] for his shingles. Dr. Linna Darner had filled this last time.  Pt requesting to have it filled this morning

## 2015-06-03 ENCOUNTER — Telehealth: Payer: Self-pay | Admitting: Cardiology

## 2015-06-03 ENCOUNTER — Ambulatory Visit (INDEPENDENT_AMBULATORY_CARE_PROVIDER_SITE_OTHER)
Admission: RE | Admit: 2015-06-03 | Discharge: 2015-06-03 | Disposition: A | Discharge status: Home / Self Care | Payer: Medicare Other | Source: Ambulatory Visit | Attending: Nurse Practitioner | Admitting: Nurse Practitioner

## 2015-06-03 ENCOUNTER — Ambulatory Visit (INDEPENDENT_AMBULATORY_CARE_PROVIDER_SITE_OTHER): Payer: Medicare Other | Admitting: Nurse Practitioner

## 2015-06-03 VITALS — BP 105/63 | HR 60 | Ht 67.0 in | Wt 183.8 lb

## 2015-06-03 DIAGNOSIS — R42 Dizziness and giddiness: Secondary | ICD-10-CM | POA: Diagnosis not present

## 2015-06-03 DIAGNOSIS — S0990XA Unspecified injury of head, initial encounter: Secondary | ICD-10-CM | POA: Diagnosis not present

## 2015-06-03 DIAGNOSIS — I4891 Unspecified atrial fibrillation: Secondary | ICD-10-CM

## 2015-06-03 DIAGNOSIS — I952 Hypotension due to drugs: Secondary | ICD-10-CM

## 2015-06-03 DIAGNOSIS — R110 Nausea: Secondary | ICD-10-CM | POA: Diagnosis not present

## 2015-06-03 LAB — BASIC METABOLIC PANEL
BUN: 17 mg/dL (ref 7–25)
CO2: 26 mmol/L (ref 20–31)
Calcium: 9 mg/dL (ref 8.6–10.3)
Chloride: 102 mmol/L (ref 98–110)
Creat: 1.31 mg/dL — ABNORMAL HIGH (ref 0.70–1.11)
Glucose, Bld: 116 mg/dL — ABNORMAL HIGH (ref 65–99)
Potassium: 4.2 mmol/L (ref 3.5–5.3)
Sodium: 139 mmol/L (ref 135–146)

## 2015-06-03 LAB — CBC
HCT: 41.6 % (ref 39.0–52.0)
Hemoglobin: 14.6 g/dL (ref 13.0–17.0)
MCH: 31.8 pg (ref 26.0–34.0)
MCHC: 35.1 g/dL (ref 30.0–36.0)
MCV: 90.6 fL (ref 78.0–100.0)
MPV: 10.7 fL (ref 8.6–12.4)
Platelets: 149 10*3/uL — ABNORMAL LOW (ref 150–400)
RBC: 4.59 MIL/uL (ref 4.22–5.81)
RDW: 15.9 % — ABNORMAL HIGH (ref 11.5–15.5)
WBC: 5.6 10*3/uL (ref 4.0–10.5)

## 2015-06-03 LAB — TSH: TSH: 0.461 u[IU]/mL (ref 0.350–4.500)

## 2015-06-03 NOTE — Patient Instructions (Addendum)
We will be checking the following labs today - BMET, CBC, TSH   Medication Instructions:    Continue with your current medicines. But   I am stopping the Norvasc - this will help your BP come up    Testing/Procedures To Be Arranged:  CT head without contrast  Follow-Up:   See Dr. Stanford Breed in December.    Other Special Instructions:   N/A    If you need a refill on your cardiac medications before your next appointment, please call your pharmacy.   Call the [place] office at [phone] if you have any questions, problems or concerns.

## 2015-06-03 NOTE — Telephone Encounter (Signed)
Returned call to patient's daughter Jocelyn Lamer.She stated father needs to be seen.B/P drops when he stands up 70/40,complaining of dizziness.Stated he is getting over shingles and continues to have pain lower back radiating around waist.Stated father is depressed talks about wanting to die.Appointment scheduled with Truitt Merle NP today at 2:30 pm at [facility] office.

## 2015-06-03 NOTE — Progress Notes (Signed)
CARDIOLOGY OFFICE NOTE  Date:  06/03/2015    [name] Date of Birth: 10-28-1922 Medical Record #[id number]  PCP:  [name], MD  Cardiologist:  Stanford Breed    Chief Complaint  Patient presents with  . Dizziness    Work in visit - seen for low BP, dizziness - seen for Dr. Stanford Breed    History of Present Illness: Ricardo Perkins is a 81 y.o. male who presents today for a work in visit. He has a history of atrial fibrillation. Myoview 3/13 was low risk with an EF of 62% with small reversible defect in the apex suggestive of very mild apical ischemia. Echocardiogram 05/20/12 demonstrated moderate LVH with normal LV function and mild pulmonary hypertension.   He was last seen by Dr. Stanford Breed in June of 2015. Felt to be doing ok.   Saw PCP about a month ago - noted to have had a fall, had profound hypoxemia with exertion, lots of edema and a rash which sounds like it turned out to be shingles.   Phone call today:  Returned call to patient's daughter Ricardo Perkins.She stated father needs to be seen.B/P drops when he stands up 70/40,complaining of dizziness.Stated he is getting over shingles and continues to have pain lower back radiating around waist.Stated father is depressed talks about wanting to die.Appointment scheduled with Truitt Merle NP today at 2:30 pm at [facility] office.            Romana Juniper at 06/03/2015 10:19 AM     Status: Signed       Expand All Collapse All   Ricardo Perkins( daughter) is calling because her father bp has been bottoming out and he is feeling really bad , can Ricardo Perkins stand .When he stands the bp drops and he is very dizzy . Please call         Thus added to the FLEX for me today.  Comes in today. Here with his daughter. Lots of issues. Had shingles about 5 weeks ago. Having diarrhea over this time frame as well. Not clear if he has had antibiotics - does not sound like he has had antibiotics. Most of his GI meds have been stopped. No  longer on Gabapentin - made him "mean". Says he never got the Famvir - pretty adamant about this. He prefers to be on less medicines. He remains on 3 liters - she says he is to be on 4. Appetite is poor. Legs swelling but not really using the Lasix much. He is short of breath with activity. He is depressed. He is fatigued. Asking about a shingles shot. BP is dropping with standing up - he is dizzy. He is nauseated. Daughter feels like he is sleepy more. He did have a fall about 6 weeks ago - "smacked" his head on the side of a brick building - broke his glasses - saw ortho due to knee swelling - no other evaluation. He continues to be on coumadin. He had another fall last week - tripped but no apparent injury.   Past Medical History  Diagnosis Date  . INSOMNIA, PERSISTENT   . DECREASED HEARING   . HYPERTENSION   . BRADYCARDIA     1st degree AVB  . RENAL INSUFFICIENCY   . BENIGN PROSTATIC HYPERTROPHY, WITH OBSTRUCTION   . ASTEATOTIC ECZEMA   . LEG EDEMA, BILATERAL   . PAROXYSMAL ATRIAL FIBRILLATION     coumadin rx  . Arthritis     knees,  shoulders   . COPD (chronic obstructive pulmonary disease) (HCC)     on chronic oxygen therapy qhs>daytime  . Advanced age   . Hx of echocardiogram     a. echo in 2009 (in [place]): normal EF, mild LVH;   b. echo 10/13:  mid Ant HK, mod LVH, EF 55%, trivial AI, mod LAE, mild RVE, mild reduced RVSF, PASP 46  . Hx of cardiovascular stress test     a.  MV 4/09:  possible small area of lateral ischemia at the apex, but Dr. Kirk Ruths reviewed this, and in fact, felt it was most likely normal.  b.  Myoview 3/13 was again low risk with an EF of 62% with small reversible defect in the apex suggestive of very mild apical ischemia.  . Large bilateral inguinal hernias - chronic, containing small bowel (right) and sigmoid colon (left) 01/15/2011    s/p B repair  . OA (osteoarthritis) of knee     S/p right TKR March '13 (Dr. Wynelle Link)   . RHINITIS, VASOMOTOR  01/07/2010    Past Surgical History  Procedure Laterality Date  . Appendectomy      @ 23  . Tonsillectomy    . Hernia repair  01/2011    bilateral inguinal hernia   . Eye surgery      bilateral cataract surgery   . Other surgical history      left finger surgery   . Knee surgery  right  . Total knee arthroplasty  10/26/2011    Procedure: TOTAL KNEE ARTHROPLASTY;  Surgeon: Gearlean Alf, MD;  Location: [facility];  Service: Orthopedics;  Laterality: Right;     Medications: Current Outpatient Prescriptions  Medication Sig Dispense Refill  . atenolol (TENORMIN) 25 MG tablet TAKE 1/2 TABLET BY MOUTH TWICE A DAY 90 tablet 3  . finasteride (PROSCAR) 5 MG tablet TAKE 1 TABLET BY MOUTH DAILY. 90 tablet 0  . furosemide (LASIX) 20 MG tablet Take 2 tablets (40 mg total) by mouth daily. TAKE ONE TABLET DAILY AS NEEDED 180 tablet 2  . predniSONE (DELTASONE) 10 MG tablet Take  2each am  Until better then 1 daily (Patient taking differently: Take 10 mg by mouth daily with breakfast. ) 60 tablet 2  . predniSONE (DELTASONE) 10 MG tablet Take 10 mg by mouth daily with breakfast.    . tamsulosin (FLOMAX) 0.4 MG CAPS capsule TAKE 1 CAPSULE BY MOUTH DAILY AFTER SUPPER. 90 capsule 0  . temazepam (RESTORIL) 30 MG capsule TAKE ONE CAPSULE BY MOUTH AT BEDTIME 90 capsule 1  . traMADol (ULTRAM) 50 MG tablet Take 1 tablet (50 mg total) by mouth every 8 (eight) hours as needed. 30 tablet 0  . warfarin (COUMADIN) 3 MG tablet Take as directed by anticoagulation clinic 110 tablet 3   No current facility-administered medications for this visit.    Allergies: No Known Allergies  Social History: The patient  reports that he quit smoking about 16 years ago. His smoking use included Cigarettes. He has a 30 pack-year smoking history. He has never used smokeless tobacco. He reports that he drinks alcohol. He reports that he does not use illicit drugs.   Family History: The patient's family history includes Cancer in  his mother; Cirrhosis in his sister; Heart attack in his brother and father; Heart disease in his father. There is no history of Diabetes.   Review of Systems: Please see the history of present illness.   Otherwise, the review of systems is positive  for none.   All other systems are reviewed and negative.   Physical Exam: VS:  BP 105/63 mmHg  Pulse 60  Ht '5\' 7"'$  (1.702 m)  Wt 183 lb 12.8 oz (83.371 kg)  BMI 28.78 kg/m2  SpO2 90% .  BMI Body mass index is 28.78 kg/(m^2).  Wt Readings from Last 3 Encounters:  06/03/15 183 lb 12.8 oz (83.371 kg)  04/22/15 187 lb (84.823 kg)  03/12/15 181 lb 12.8 oz (82.464 kg)   105/63 sitting and 92/55 standing.   General: Elderly male. Looks chronically ill. Has oxygen in place. He is in no acute distress.  HEENT: Normal. Neck: Supple, no JVD, carotid bruits, or masses noted.  Cardiac: Regular rhythm. No murmurs, rubs, or gallops. Trace edema.  Respiratory:  Lungs are clear to auscultation bilaterally with normal work of breathing.  GI: Soft and nontender.  MS: No deformity or atrophy. Gait and ROM intact. Skin: Warm and dry. Color is normal.  Neuro:  Strength and sensation are intact and no gross focal deficits noted.  Psych: Alert, appropriate and with normal affect.   LABORATORY DATA:  EKG:  EKG is ordered today. This demonstrates sinus brady with 1st degree AV block with PAC. Does have inferior Q's today that are new.  Lab Results  Component Value Date   WBC 5.8 04/22/2015   HGB 15.7 04/22/2015   HCT 47.4 04/22/2015   PLT 166.0 04/22/2015   GLUCOSE 89 04/22/2015   ALT 13 12/06/2011   AST 13 12/06/2011   NA 142 04/22/2015   K 3.8 04/22/2015   CL 103 04/22/2015   CREATININE 1.20 04/22/2015   BUN 24* 04/22/2015   CO2 27 04/22/2015   TSH 1.58 05/12/2012   INR 2.5 05/14/2015   HGBA1C 6.4 01/30/2015   Lab Results  Component Value Date   INR 2.5 05/14/2015   INR 4.3 04/16/2015   INR 2.8 03/19/2015   PROTIME 17.6 01/04/2009    PROTIME 15.9 12/21/2008    BNP (last 3 results) No results for input(s): BNP in the last 8760 hours.  ProBNP (last 3 results) No results for input(s): PROBNP in the last 8760 hours.   Other Studies Reviewed Today:  Echo Study Conclusions from 2013  - Left ventricle: Mild hypokinesis of the mid anterior wall. The cavity size was normal. Wall thickness was increased in a pattern of moderate LVH. The estimated ejection fraction was 55%. Doppler parameters are consistent with high ventricular filling pressure. - Aortic valve: Trivial regurgitation. - Aorta: Aortic root size is normal. There is slight dilitation of the sinuses of valsalva and the ascending aorta. - Left atrium: The atrium was moderately dilated. - Right ventricle: The cavity size was mildly dilated. Systolic function was mildly reduced. - Pulmonary arteries: PA peak pressure: 72m Hg (S).   Myoview Impression from 2013 Exercise Capacity: LMulberrywith no exercise. BP Response: Normal blood pressure response. Clinical Symptoms: No chest pain. ECG Impression: No significant ST segment change suggestive of ischemia. Comparison with Prior Nuclear Study: No images to compare  Overall Impression: Low risk stress nuclear study with a small reversible defect in the apex suggestive of very mild apical ischemia.   BKirk Ruths  Assessment/Plan: 1. Multitude of somatic complaints - recent shingles, having diarrhea, orthostatic. Has fallen as well. Stopping Norvasc today. Check labs. Arrange CT of head as well. If falls persist, would favor stopping Warfarin.   2. PAF - remains in NSR today. I have left him on low dose  atenolol.   3. Chronic coumadin - prior falls - lots of somatic issues - will check CT head  4. Shingles  5. Diarrhea  6. Abnormal EKG - new inferior Q's. Would favor conservative management. No active symptoms reported.   Current medicines are reviewed with the patient  today.  The patient does not have concerns regarding medicines other than what has been noted above.  The following changes have been made:  See above.  Labs/ tests ordered today include:    Orders Placed This Encounter  Procedures  . CT Head Wo Contrast  . Basic metabolic panel  . CBC  . TSH  . EKG 12-Lead     Disposition:   FU with Dr. Stanford Breed in December for follow up visit.   Patient is agreeable to this plan and will call if any problems develop in the interim.   Signed: Burtis Junes, RN, ANP-C 06/03/2015 3:53 PM  [facility] [address] [place], [place]  [address] Phone: [phone] Fax: [phone]

## 2015-06-03 NOTE — Telephone Encounter (Signed)
Ricardo Perkins( daughter) is calling because her father bp has been bottoming out and he is feeling really bad , can harley stand .When he stands the bp drops and he is very dizzy . Please call   Thanks

## 2015-06-08 ENCOUNTER — Other Ambulatory Visit: Payer: Self-pay | Admitting: Internal Medicine

## 2015-06-11 ENCOUNTER — Ambulatory Visit (INDEPENDENT_AMBULATORY_CARE_PROVIDER_SITE_OTHER): Payer: Medicare Other | Admitting: General Practice

## 2015-06-11 DIAGNOSIS — Z5181 Encounter for therapeutic drug level monitoring: Secondary | ICD-10-CM | POA: Diagnosis not present

## 2015-06-11 DIAGNOSIS — I4891 Unspecified atrial fibrillation: Secondary | ICD-10-CM | POA: Diagnosis not present

## 2015-06-11 LAB — POCT INR: INR: 3.7

## 2015-06-11 NOTE — Progress Notes (Signed)
Pre visit review using our clinic review tool, if applicable. No additional management support is needed unless otherwise documented below in the visit note. 

## 2015-06-11 NOTE — Progress Notes (Signed)
I have reviewed and agree with the plan. 

## 2015-06-24 ENCOUNTER — Other Ambulatory Visit: Payer: Self-pay | Admitting: Geriatric Medicine

## 2015-06-24 ENCOUNTER — Telehealth: Payer: Self-pay | Admitting: Internal Medicine

## 2015-06-24 MED ORDER — TRAMADOL HCL 50 MG PO TABS: 50.0000 mg | ORAL_TABLET | Freq: Three times a day (TID) | ORAL | Status: DC | PRN

## 2015-06-24 NOTE — Telephone Encounter (Signed)
Printed and signed.  

## 2015-06-24 NOTE — Telephone Encounter (Signed)
Pt is requesting traMADol (ULTRAM) 50 MG tablet [[id number] for his shingles. She is requesting a prescription with some refills so she won't have to call every month. Pharmacy is [facility] on [address]

## 2015-06-25 ENCOUNTER — Other Ambulatory Visit: Payer: Self-pay | Admitting: Geriatric Medicine

## 2015-06-25 NOTE — Telephone Encounter (Signed)
Sent to pharmacy 

## 2015-06-27 NOTE — Progress Notes (Signed)
HPI: FU atrial fibrillation. Myoview 3/13 was low risk with an EF of 62% with small reversible defect in the apex suggestive of very mild apical ischemia. Echocardiogram 05/20/12 demonstrated moderate LVH with normal LV function and mild pulmonary hypertension. Seen recently with complaints of orthostasis. Norvasc was discontinued. Since last seen, He does have dyspnea on exertion and is now on oxygen all the time. No chest pain, palpitations or syncope.  Current Outpatient Prescriptions  Medication Sig Dispense Refill  . amLODipine (NORVASC) 10 MG tablet Take 1 tablet (10 mg total) by mouth daily. 90 tablet 1  . atenolol (TENORMIN) 25 MG tablet TAKE 1/2 TABLET BY MOUTH TWICE A DAY 90 tablet 3  . finasteride (PROSCAR) 5 MG tablet TAKE 1 TABLET BY MOUTH DAILY. 90 tablet 0  . furosemide (LASIX) 20 MG tablet Take 2 tablets (40 mg total) by mouth daily. TAKE ONE TABLET DAILY AS NEEDED 180 tablet 2  . omeprazole (PRILOSEC) 20 MG capsule TAKE ONE CAPSULE BY MOUTH EVERY DAY AS NEEDED (Patient taking differently: TAKE ONE CAPSULE BY MOUTH EVERY DAY AS NEEDED FOR HEARTBURN OR INDIGESTION) 90 capsule 1  . tamsulosin (FLOMAX) 0.4 MG CAPS capsule TAKE 1 CAPSULE BY MOUTH DAILY AFTER SUPPER. 90 capsule 0  . temazepam (RESTORIL) 30 MG capsule TAKE ONE CAPSULE BY MOUTH AT BEDTIME 90 capsule 1  . traMADol (ULTRAM) 50 MG tablet Take 1 tablet (50 mg total) by mouth every 8 (eight) hours as needed. (Patient taking differently: Take 50 mg by mouth every 8 (eight) hours as needed for moderate pain or severe pain. ) 30 tablet 3  . warfarin (COUMADIN) 3 MG tablet Take as directed by anticoagulation clinic 110 tablet 3   No current facility-administered medications for this visit.     Past Medical History  Diagnosis Date  . INSOMNIA, PERSISTENT   . DECREASED HEARING   . HYPERTENSION   . BRADYCARDIA     1st degree AVB  . RENAL INSUFFICIENCY   . BENIGN PROSTATIC HYPERTROPHY, WITH OBSTRUCTION   . ASTEATOTIC  ECZEMA   . LEG EDEMA, BILATERAL   . PAROXYSMAL ATRIAL FIBRILLATION     coumadin rx  . Arthritis     knees, shoulders   . COPD (chronic obstructive pulmonary disease) (HCC)     on chronic oxygen therapy qhs>daytime  . Advanced age   . Hx of echocardiogram     a. echo in 2009 (in [place]): normal EF, mild LVH;   b. echo 10/13:  mid Ant HK, mod LVH, EF 55%, trivial AI, mod LAE, mild RVE, mild reduced RVSF, PASP 46  . Hx of cardiovascular stress test     a.  MV 4/09:  possible small area of lateral ischemia at the apex, but Dr. Kirk Ruths reviewed this, and in fact, felt it was most likely normal.  b.  Myoview 3/13 was again low risk with an EF of 62% with small reversible defect in the apex suggestive of very mild apical ischemia.  . Large bilateral inguinal hernias - chronic, containing small bowel (right) and sigmoid colon (left) 01/15/2011    s/p B repair  . OA (osteoarthritis) of knee     S/p right TKR March '13 (Dr. Wynelle Link)   . RHINITIS, VASOMOTOR 01/07/2010    Past Surgical History  Procedure Laterality Date  . Appendectomy      @ 23  . Tonsillectomy    . Hernia repair  01/2011    bilateral inguinal hernia   .  Eye surgery      bilateral cataract surgery   . Other surgical history      left finger surgery   . Knee surgery  right  . Total knee arthroplasty  10/26/2011    Procedure: TOTAL KNEE ARTHROPLASTY;  Surgeon: Gearlean Alf, MD;  Location: [facility];  Service: Orthopedics;  Laterality: Right;    Social History   Social History  . Marital Status: Single    Spouse Name: N/A  . Number of Children: N/A  . Years of Education: N/A   Occupational History  . Not on file.   Social History Main Topics  . Smoking status: Former Smoker -- 1.00 packs/day for 30 years    Types: Cigarettes    Quit date: 07/20/1998  . Smokeless tobacco: Never Used  . Alcohol Use: 0.0 oz/week    0 Standard drinks or equivalent per week     Comment: wine occasional   . Drug Use: No  .  Sexual Activity: Not Currently   Other Topics Concern  . 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 Remaining systems are negative.  Physical Exam: Well-developed well-nourished in no acute distress.  Skin is warm and dry. There is a rash on the right back and abdomen in a dermatomal distribution. HEENT is normal.  Neck is supple.  Chest is clear to auscultation with normal expansion.  Cardiovascular exam is regular rate and rhythm.  Abdominal exam nontender or distended. No masses palpated. Extremities show no edema. neuro grossly intact  ECG 06/03/2015-sinus rhythm, non-conducted PACs, first-degree AV block, left ventricular hypertrophy, anterior and inferior infarcts.

## 2015-07-01 ENCOUNTER — Encounter: Payer: Self-pay | Admitting: Cardiology

## 2015-07-01 ENCOUNTER — Ambulatory Visit (INDEPENDENT_AMBULATORY_CARE_PROVIDER_SITE_OTHER): Payer: Medicare Other | Admitting: Cardiology

## 2015-07-01 VITALS — BP 140/60 | HR 64 | Ht 67.0 in | Wt 180.0 lb

## 2015-07-01 DIAGNOSIS — I4891 Unspecified atrial fibrillation: Secondary | ICD-10-CM

## 2015-07-01 DIAGNOSIS — I480 Paroxysmal atrial fibrillation: Secondary | ICD-10-CM

## 2015-07-01 DIAGNOSIS — R9431 Abnormal electrocardiogram [ECG] [EKG]: Secondary | ICD-10-CM

## 2015-07-01 MED ORDER — APIXABAN 5 MG PO TABS: 5.0000 mg | ORAL_TABLET | Freq: Two times a day (BID) | ORAL | Status: DC

## 2015-07-01 NOTE — Assessment & Plan Note (Signed)
Patient remains in sinus rhythm on examination. Continue beta blocker and Coumadin.I have provided the name of apixaban. He will check to see how much this would cost with his insurance. He will contact us if he would like to change.

## 2015-07-01 NOTE — Assessment & Plan Note (Signed)
Patient's blood pressureHas been elevated at home. Resume Norvasc 10 mg daily.

## 2015-07-01 NOTE — Assessment & Plan Note (Signed)
Patient with question anterior and inferior infarct on echocardiogram. Check echocardiogram for wall motion.

## 2015-07-01 NOTE — Patient Instructions (Signed)
Medication Instructions:  RESUME Amlodipine (Norvasc) 10 mg - take 1 tablet by mouth daily.  Labwork: NONE  Testing/Procedures: 1. 2D Echocardiogram - Your physician has requested that you have an echocardiogram. Echocardiography is a painless test that uses sound waves to create images of your heart. It provides your doctor with information about the size and shape of your heart and how well your heart's chambers and valves are working. This procedure takes approximately one hour. There are no restrictions for this procedure.  Follow-Up: Dr Stanford Breed recommends that you schedule a follow-up appointment in 6 months. You will receive a reminder letter in the mail two months in advance. If you don't receive a letter, please call our office to schedule the follow-up appointment.  If you need a refill on your cardiac medications before your next appointment, please call your pharmacy.

## 2015-07-02 ENCOUNTER — Other Ambulatory Visit: Payer: Self-pay | Admitting: Emergency Medicine

## 2015-07-02 ENCOUNTER — Ambulatory Visit (INDEPENDENT_AMBULATORY_CARE_PROVIDER_SITE_OTHER): Payer: Medicare Other | Admitting: General Practice

## 2015-07-02 DIAGNOSIS — I4891 Unspecified atrial fibrillation: Secondary | ICD-10-CM | POA: Diagnosis not present

## 2015-07-02 LAB — POCT INR: INR: 2

## 2015-07-02 MED ORDER — TAMSULOSIN HCL 0.4 MG PO CAPS: ORAL_CAPSULE | ORAL | Status: DC

## 2015-07-02 MED ORDER — FINASTERIDE 5 MG PO TABS: 5.0000 mg | ORAL_TABLET | Freq: Every day | ORAL | Status: DC

## 2015-07-02 NOTE — Progress Notes (Signed)
Pre visit review using our clinic review tool, if applicable. No additional management support is needed unless otherwise documented below in the visit note. 

## 2015-07-02 NOTE — Progress Notes (Signed)
I have reviewed and agree with the plan. 

## 2015-07-05 ENCOUNTER — Telehealth: Payer: Self-pay

## 2015-07-05 NOTE — Telephone Encounter (Signed)
PA initiated via covermymeds. Key for PA is Frederika

## 2015-07-16 ENCOUNTER — Ambulatory Visit (HOSPITAL_COMMUNITY): Payer: Medicare Other

## 2015-07-17 ENCOUNTER — Telehealth: Payer: Self-pay | Admitting: *Deleted

## 2015-07-17 DIAGNOSIS — R911 Solitary pulmonary nodule: Secondary | ICD-10-CM

## 2015-07-17 NOTE — Telephone Encounter (Signed)
CT chest has been ordered for Jan 2017

## 2015-07-17 NOTE — Telephone Encounter (Signed)
-----   Message from Tanda Rockers, MD sent at 03/12/2015  3:13 PM EDT ----- Ct s contrast due for f/u spn

## 2015-07-18 ENCOUNTER — Telehealth: Payer: Self-pay | Admitting: Internal Medicine

## 2015-07-18 NOTE — Telephone Encounter (Signed)
Called spoke with pt. Made aware of below. He verbalized understanding and had no questions.

## 2015-07-18 NOTE — Telephone Encounter (Signed)
No problem for CT

## 2015-07-18 NOTE — Telephone Encounter (Signed)
Called and spoke with pt. Pt wants to inform MW that he still has the shingles and wants to make sure it is okay to proceed with his CT on 07/26/15. He stated that this is his 11th week with the shingles and they are starting to disappear. He stated he has no open sores but is constantly itching. I explained to him that I would send the message to MW for his recommendations and will return his call. Pt voiced understanding and had no further questions.   MW please advise if okay to have CT

## 2015-07-26 ENCOUNTER — Ambulatory Visit (HOSPITAL_COMMUNITY): Payer: Medicare Other | Attending: Cardiology

## 2015-07-26 ENCOUNTER — Ambulatory Visit (INDEPENDENT_AMBULATORY_CARE_PROVIDER_SITE_OTHER)
Admission: RE | Admit: 2015-07-26 | Discharge: 2015-07-26 | Disposition: A | Discharge status: Home / Self Care | Payer: Medicare Other | Source: Ambulatory Visit | Attending: Internal Medicine | Admitting: Internal Medicine

## 2015-07-26 ENCOUNTER — Other Ambulatory Visit: Payer: Self-pay

## 2015-07-26 DIAGNOSIS — I10 Essential (primary) hypertension: Secondary | ICD-10-CM | POA: Diagnosis not present

## 2015-07-26 DIAGNOSIS — R911 Solitary pulmonary nodule: Secondary | ICD-10-CM

## 2015-07-26 DIAGNOSIS — I351 Nonrheumatic aortic (valve) insufficiency: Secondary | ICD-10-CM | POA: Diagnosis not present

## 2015-07-26 DIAGNOSIS — I4891 Unspecified atrial fibrillation: Secondary | ICD-10-CM | POA: Diagnosis not present

## 2015-07-26 DIAGNOSIS — Z87891 Personal history of nicotine dependence: Secondary | ICD-10-CM | POA: Insufficient documentation

## 2015-07-26 DIAGNOSIS — I517 Cardiomegaly: Secondary | ICD-10-CM | POA: Insufficient documentation

## 2015-07-29 ENCOUNTER — Telehealth: Payer: Self-pay | Admitting: *Deleted

## 2015-07-29 NOTE — Telephone Encounter (Signed)
LMTCB

## 2015-07-29 NOTE — Telephone Encounter (Signed)
-----   Message from Tanda Rockers, MD sent at 07/29/2015 10:05 AM EST ----- I accidentally closed his CT result Let him know it's unchanged, probably benign but radiology recs another scan in 6 m to be sure and I agree and already put him in the reminder file

## 2015-07-30 ENCOUNTER — Ambulatory Visit (INDEPENDENT_AMBULATORY_CARE_PROVIDER_SITE_OTHER): Payer: Medicare Other | Admitting: General Practice

## 2015-07-30 DIAGNOSIS — I4891 Unspecified atrial fibrillation: Secondary | ICD-10-CM

## 2015-07-30 DIAGNOSIS — Z5181 Encounter for therapeutic drug level monitoring: Secondary | ICD-10-CM

## 2015-07-30 LAB — POCT INR: INR: 2.3

## 2015-07-30 NOTE — Progress Notes (Signed)
I have reviewed and agree with the plan. 

## 2015-07-31 NOTE — Telephone Encounter (Signed)
Spoke with pt, aware of results/recs.  Nothing further needed.  

## 2015-08-03 ENCOUNTER — Other Ambulatory Visit: Payer: Self-pay | Admitting: Internal Medicine

## 2015-08-05 ENCOUNTER — Other Ambulatory Visit: Payer: Self-pay | Admitting: Internal Medicine

## 2015-08-08 ENCOUNTER — Encounter: Payer: Self-pay | Admitting: Internal Medicine

## 2015-08-09 MED ORDER — TRIAMCINOLONE ACETONIDE 0.1 % EX CREA: 1.0000 "application " | TOPICAL_CREAM | Freq: Two times a day (BID) | CUTANEOUS | Status: DC

## 2015-08-16 ENCOUNTER — Other Ambulatory Visit: Payer: Self-pay | Admitting: Internal Medicine

## 2015-08-19 ENCOUNTER — Other Ambulatory Visit: Payer: Self-pay

## 2015-08-19 MED ORDER — FINASTERIDE 5 MG PO TABS: 5.0000 mg | ORAL_TABLET | Freq: Every day | ORAL | Status: DC

## 2015-08-19 NOTE — Telephone Encounter (Signed)
Pt called requesting new Rx to [facility]. Pt no longer used the [facility] formerly on file. Rx sent, pt informed

## 2015-08-30 ENCOUNTER — Ambulatory Visit (INDEPENDENT_AMBULATORY_CARE_PROVIDER_SITE_OTHER): Payer: Medicare Other | Admitting: General Practice

## 2015-08-30 ENCOUNTER — Ambulatory Visit (INDEPENDENT_AMBULATORY_CARE_PROVIDER_SITE_OTHER): Payer: Medicare Other | Admitting: Nurse Practitioner

## 2015-08-30 VITALS — BP 120/72 | HR 55 | Temp 97.5°F | Resp 18 | Wt 184.0 lb

## 2015-08-30 DIAGNOSIS — B0229 Other postherpetic nervous system involvement: Secondary | ICD-10-CM

## 2015-08-30 DIAGNOSIS — I4891 Unspecified atrial fibrillation: Secondary | ICD-10-CM | POA: Diagnosis not present

## 2015-08-30 DIAGNOSIS — Z5181 Encounter for therapeutic drug level monitoring: Secondary | ICD-10-CM

## 2015-08-30 LAB — POCT INR: INR: 2.1

## 2015-08-30 MED ORDER — GABAPENTIN 100 MG PO CAPS
100.0000 mg | ORAL_CAPSULE | Freq: Every day | ORAL | Status: DC
Start: 2015-08-30 — End: 2016-01-22

## 2015-08-30 NOTE — Progress Notes (Signed)
I have reviewed and agree with the plan. 

## 2015-08-30 NOTE — Progress Notes (Signed)
Pre visit review using our clinic review tool, if applicable. No additional management support is needed unless otherwise documented below in the visit note. 

## 2015-08-30 NOTE — Patient Instructions (Signed)
Take 1 capsule of gabapentin at night for 7 days after 7 days you can take 2 capsules at night.   Follow up in 2 weeks.

## 2015-08-30 NOTE — Progress Notes (Signed)
Patient ID: Ricardo Perkins, male    DOB: [birth year]  Age: 82 y.o. MRN: [id number]  CC: Abdominal Pain   HPI Ricardo Perkins presents for abdominal pain since July.   1) Had shingles in July  Pain has not resolved in that same area Feels like a pinching grabbing sensation Under right arm down the back to the front  Denies any further rashes Denies treatment to date  CrCl 35.1 calculated  History [name] has a past medical history of INSOMNIA, PERSISTENT; DECREASED HEARING; HYPERTENSION; BRADYCARDIA; RENAL INSUFFICIENCY; BENIGN PROSTATIC HYPERTROPHY, WITH OBSTRUCTION; ASTEATOTIC ECZEMA; LEG EDEMA, BILATERAL; PAROXYSMAL ATRIAL FIBRILLATION; Arthritis; COPD (chronic obstructive pulmonary disease) ([place]); Advanced age; echocardiogram; cardiovascular stress test; Large bilateral inguinal hernias - chronic, containing small bowel (right) and sigmoid colon (left) (01/15/2011); OA (osteoarthritis) of knee; and RHINITIS, VASOMOTOR (01/07/2010).   He has past surgical history that includes Appendectomy; Tonsillectomy; Hernia repair (01/2011); Eye surgery; Other surgical history; Knee surgery (right); and Total knee arthroplasty (10/26/2011).   His family history includes Cancer in his mother; Cirrhosis in his sister; Heart attack in his brother and father; Heart disease in his father. There is no history of Diabetes.He reports that he quit smoking about 17 years ago. His smoking use included Cigarettes. He has a 30 pack-year smoking history. He has never used smokeless tobacco. He reports that he drinks alcohol. He reports that he does not use illicit drugs.  Outpatient Prescriptions Prior to Visit  Medication Sig Dispense Refill  . amLODipine (NORVASC) 10 MG tablet Take 1 tablet (10 mg total) by mouth daily. 90 tablet 1  . atenolol (TENORMIN) 25 MG tablet TAKE 1/2 TABLET BY MOUTH TWICE A DAY 90 tablet 3  . finasteride (PROSCAR) 5 MG tablet Take 1 tablet (5 mg total) by mouth daily. 90 tablet 1  . furosemide  (LASIX) 20 MG tablet Take 2 tablets (40 mg total) by mouth daily. TAKE ONE TABLET DAILY AS NEEDED 180 tablet 2  . omeprazole (PRILOSEC) 20 MG capsule TAKE ONE CAPSULE BY MOUTH EVERY DAY AS NEEDED (Patient taking differently: TAKE ONE CAPSULE BY MOUTH EVERY DAY AS NEEDED FOR HEARTBURN OR INDIGESTION) 90 capsule 1  . predniSONE (DELTASONE) 10 MG tablet Take 1 tablet (10 mg total) by mouth daily with breakfast. 30 tablet 0  . tamsulosin (FLOMAX) 0.4 MG CAPS capsule Take 1 capsule (0.4 mg total) by mouth daily after supper. --- Pt needs appt for further refills 90 capsule 0  . temazepam (RESTORIL) 30 MG capsule TAKE ONE CAPSULE BY MOUTH AT BEDTIME 90 capsule 1  . traMADol (ULTRAM) 50 MG tablet Take 1 tablet (50 mg total) by mouth every 8 (eight) hours as needed. (Patient taking differently: Take 50 mg by mouth every 8 (eight) hours as needed for moderate pain or severe pain. ) 30 tablet 3  . warfarin (COUMADIN) 3 MG tablet Take as directed by anticoagulation clinic 110 tablet 3   No facility-administered medications prior to visit.    ROS Review of Systems  Constitutional: Negative for fever, chills, diaphoresis and fatigue.  Musculoskeletal: Positive for myalgias.  Skin:       hyperpigmentation where shingles were    Objective:  BP 120/72 mmHg  Pulse 55  Temp(Src) 97.5 F (36.4 C) (Oral)  Resp 18  Wt 184 lb (83.462 kg)  SpO2 90%  Physical Exam  Constitutional: He is oriented to person, place, and time. He appears well-developed and well-nourished. No distress.  HENT:  Head: Normocephalic and atraumatic.  Right  Ear: External ear normal.  Left Ear: External ear normal.  Cardiovascular: Normal rate, regular rhythm and normal heart sounds.  Exam reveals no gallop and no friction rub.   No murmur heard. Pulmonary/Chest: Effort normal and breath sounds normal. No respiratory distress. He has no wheezes. He has no rales. He exhibits no tenderness.  Decreased air entry bilaterally   Neurological: He is alert and oriented to person, place, and time.  Skin: Skin is warm and dry. No rash noted. He is not diaphoretic. No erythema. No pallor.  Hyperpigmented areas where shingles were on the right flank to the right abdomen No current vesicular lesions Tender to palpation  Psychiatric: He has a normal mood and affect. His behavior is normal. Judgment and thought content normal.   Assessment & Plan:   Ricardo Perkins was seen today for abdominal pain.  Diagnoses and all orders for this visit:  Post herpetic neuralgia  Other orders -     gabapentin (NEURONTIN) 100 MG capsule; Take 1 capsule (100 mg total) by mouth at bedtime. Can take 2 capsules at night after first 7 days  I am having Ricardo Perkins start on gabapentin. I am also having him maintain his furosemide, atenolol, warfarin, temazepam, omeprazole, amLODipine, traMADol, predniSONE, tamsulosin, and finasteride.  Meds ordered this encounter  Medications  . gabapentin (NEURONTIN) 100 MG capsule    Sig: Take 1 capsule (100 mg total) by mouth at bedtime. Can take 2 capsules at night after first 7 days    Dispense:  60 capsule    Refill:  0    Order Specific Question:  Supervising Provider    Answer:  Crecencio Mc [[id number]]     Follow-up: Return in about 2 weeks (around 09/13/2015) for Follow up of PHN.

## 2015-09-01 ENCOUNTER — Encounter: Payer: Self-pay | Admitting: Nurse Practitioner

## 2015-09-01 DIAGNOSIS — B0229 Other postherpetic nervous system involvement: Secondary | ICD-10-CM | POA: Insufficient documentation

## 2015-09-01 NOTE — Assessment & Plan Note (Signed)
New-onset Started patient on gabapentin 100 mg at nighttime after 7 days 2 capsules at nighttime. Creatinine clearance was calculated and renal dosing was done. Advised about drowsy effects and not getting out of bed too fast.  Follow-up in 2 weeks

## 2015-09-10 ENCOUNTER — Other Ambulatory Visit: Payer: Self-pay | Admitting: Internal Medicine

## 2015-09-13 ENCOUNTER — Encounter: Payer: Self-pay | Admitting: Internal Medicine

## 2015-09-13 ENCOUNTER — Ambulatory Visit (INDEPENDENT_AMBULATORY_CARE_PROVIDER_SITE_OTHER): Payer: Medicare Other | Admitting: Internal Medicine

## 2015-09-13 VITALS — BP 110/62 | HR 57 | Temp 97.5°F | Resp 18 | Ht 67.0 in | Wt 184.0 lb

## 2015-09-13 DIAGNOSIS — B0229 Other postherpetic nervous system involvement: Secondary | ICD-10-CM | POA: Diagnosis not present

## 2015-09-13 MED ORDER — LIDOCAINE 5 % EX PTCH: 1.0000 | MEDICATED_PATCH | CUTANEOUS | Status: DC

## 2015-09-13 NOTE — Progress Notes (Signed)
Pre visit review using our clinic review tool, if applicable. No additional management support is needed unless otherwise documented below in the visit note. 

## 2015-09-13 NOTE — Assessment & Plan Note (Signed)
Gabapentin is not effective and he will stop. Rx for lidoderm patches for the area.

## 2015-09-13 NOTE — Patient Instructions (Addendum)
We have sent in the prescription patches for your shingles rash. They are called lidoderm and you can apply to the area every 12 hours.

## 2015-09-13 NOTE — Progress Notes (Signed)
   Subjective:    Patient ID: Ricardo Perkins, male    DOB: [birth year], 82 y.o.   MRN: [id number]  HPI The patient is a 82 YO man coming in for his post-herpetic neuralgia. He has been suffering from it since last July. He has improved gradually over that time but pain all the time. Sometimes stabbing extra pains if he moves a certain way. The skin is sensitive to the touch. He started taking gabapentin but this did not help at all. He kept taking it. He uses salon pas which helps some but only for a short time.   Review of Systems  Constitutional: Negative.   Respiratory: Negative.   Cardiovascular: Negative.   Gastrointestinal: Negative.   Skin: Positive for color change and rash.  Neurological: Negative for dizziness, weakness and numbness.  Psychiatric/Behavioral: Negative.       Objective:   Physical Exam  Constitutional: He appears well-developed and well-nourished.  HENT:  Head: Normocephalic and atraumatic.  Eyes: EOM are normal.  Cardiovascular: Normal rate and regular rhythm.   Pulmonary/Chest: Effort normal and breath sounds normal.  Abdominal: Soft.  Skin: Rash noted.  Discoloration of the back and side from shingles, does not appear active.    Filed Vitals:   09/13/15 1112  BP: 110/62  Pulse: 57  Temp: 97.5 F (36.4 C)  TempSrc: Oral  Resp: 18  Height: '5\' 7"'$  (1.702 m)  Weight: 184 lb (83.462 kg)  SpO2: 89%      Assessment & Plan:

## 2015-09-16 ENCOUNTER — Telehealth: Payer: Self-pay | Admitting: Cardiology

## 2015-09-16 ENCOUNTER — Telehealth: Payer: Self-pay

## 2015-09-16 NOTE — Telephone Encounter (Signed)
PA initiated via [facility]

## 2015-09-16 NOTE — Telephone Encounter (Signed)
Pt recently seen in office by Dr. Sharlet Salina for general care. Daughter has some concerns, she thinks pt is having issues w/ BP running low.  Notes BP ~110-114/40-50 when sitting. She is esp concerned about diastolic. Pt becomes dizzy when standing, is often unsteady on feet. Noticeable ~1.5 weeks. Also notes concern for O2 dropping. He has some incr swelling on feet.  Meds as listed. Informed her I would route for recommendation on adjustment to address BP, swelling.

## 2015-09-16 NOTE — Telephone Encounter (Signed)
PA Approved via CoverMyMeds 

## 2015-09-16 NOTE — Telephone Encounter (Signed)
Pt's blood pressure is dropping,it is in the 40's and 50's.This is his bottom number,very concerned about this.

## 2015-09-16 NOTE — Telephone Encounter (Signed)
Have him cut amlodipine from 10 mg to 5 mg daily.  Call back in 2-3 weeks and let us know if BP increased and sx resolved.

## 2015-09-16 NOTE — Telephone Encounter (Signed)
Recommendations communicated to caller, who was thankful for and receptive to the advice. She will call me back in 2-3 weeks with an update on patient's condition.  She will call back sooner, as I instructed, if new problems or no discernable improvement after a week or so.

## 2015-10-01 ENCOUNTER — Other Ambulatory Visit: Payer: Self-pay | Admitting: Internal Medicine

## 2015-10-02 NOTE — Telephone Encounter (Signed)
Sent to pharmacy 

## 2015-10-03 ENCOUNTER — Emergency Department (HOSPITAL_COMMUNITY): Payer: Medicare Other

## 2015-10-03 ENCOUNTER — Encounter (HOSPITAL_COMMUNITY): Payer: Self-pay | Admitting: Emergency Medicine

## 2015-10-03 ENCOUNTER — Observation Stay (HOSPITAL_COMMUNITY)
Admission: EM | Admit: 2015-10-03 | Discharge: 2015-10-05 | Disposition: A | Discharge status: Home / Self Care | Payer: Medicare Other | Attending: Internal Medicine | Admitting: Internal Medicine

## 2015-10-03 DIAGNOSIS — N400 Enlarged prostate without lower urinary tract symptoms: Secondary | ICD-10-CM | POA: Diagnosis not present

## 2015-10-03 DIAGNOSIS — J8410 Pulmonary fibrosis, unspecified: Secondary | ICD-10-CM | POA: Diagnosis not present

## 2015-10-03 DIAGNOSIS — K625 Hemorrhage of anus and rectum: Secondary | ICD-10-CM

## 2015-10-03 DIAGNOSIS — N183 Chronic kidney disease, stage 3 unspecified: Secondary | ICD-10-CM | POA: Diagnosis present

## 2015-10-03 DIAGNOSIS — J449 Chronic obstructive pulmonary disease, unspecified: Secondary | ICD-10-CM | POA: Insufficient documentation

## 2015-10-03 DIAGNOSIS — I129 Hypertensive chronic kidney disease with stage 1 through stage 4 chronic kidney disease, or unspecified chronic kidney disease: Secondary | ICD-10-CM | POA: Insufficient documentation

## 2015-10-03 DIAGNOSIS — Z79899 Other long term (current) drug therapy: Secondary | ICD-10-CM | POA: Insufficient documentation

## 2015-10-03 DIAGNOSIS — Z7952 Long term (current) use of systemic steroids: Secondary | ICD-10-CM | POA: Diagnosis not present

## 2015-10-03 DIAGNOSIS — K5730 Diverticulosis of large intestine without perforation or abscess without bleeding: Secondary | ICD-10-CM | POA: Insufficient documentation

## 2015-10-03 DIAGNOSIS — Z7901 Long term (current) use of anticoagulants: Secondary | ICD-10-CM | POA: Insufficient documentation

## 2015-10-03 DIAGNOSIS — I4891 Unspecified atrial fibrillation: Secondary | ICD-10-CM | POA: Diagnosis present

## 2015-10-03 DIAGNOSIS — Z96651 Presence of right artificial knee joint: Secondary | ICD-10-CM | POA: Diagnosis not present

## 2015-10-03 DIAGNOSIS — I480 Paroxysmal atrial fibrillation: Secondary | ICD-10-CM | POA: Diagnosis not present

## 2015-10-03 DIAGNOSIS — I77811 Abdominal aortic ectasia: Secondary | ICD-10-CM | POA: Diagnosis not present

## 2015-10-03 DIAGNOSIS — Z9981 Dependence on supplemental oxygen: Secondary | ICD-10-CM | POA: Diagnosis not present

## 2015-10-03 DIAGNOSIS — Z9049 Acquired absence of other specified parts of digestive tract: Secondary | ICD-10-CM | POA: Diagnosis not present

## 2015-10-03 DIAGNOSIS — K922 Gastrointestinal hemorrhage, unspecified: Secondary | ICD-10-CM | POA: Diagnosis not present

## 2015-10-03 DIAGNOSIS — I7300 Raynaud's syndrome without gangrene: Secondary | ICD-10-CM | POA: Diagnosis present

## 2015-10-03 DIAGNOSIS — I440 Atrioventricular block, first degree: Secondary | ICD-10-CM | POA: Insufficient documentation

## 2015-10-03 DIAGNOSIS — I714 Abdominal aortic aneurysm, without rupture: Secondary | ICD-10-CM | POA: Insufficient documentation

## 2015-10-03 DIAGNOSIS — B0229 Other postherpetic nervous system involvement: Secondary | ICD-10-CM | POA: Diagnosis present

## 2015-10-03 DIAGNOSIS — Z87891 Personal history of nicotine dependence: Secondary | ICD-10-CM | POA: Insufficient documentation

## 2015-10-03 DIAGNOSIS — R0902 Hypoxemia: Secondary | ICD-10-CM | POA: Diagnosis not present

## 2015-10-03 DIAGNOSIS — I10 Essential (primary) hypertension: Secondary | ICD-10-CM

## 2015-10-03 LAB — CBC WITH DIFFERENTIAL/PLATELET
BASOS ABS: 0 10*3/uL (ref 0.0–0.1)
Basophils Absolute: 0 10*3/uL (ref 0.0–0.1)
Basophils Relative: 0 %
Basophils Relative: 0 %
EOS ABS: 0 10*3/uL (ref 0.0–0.7)
EOS PCT: 0 %
EOS PCT: 0 %
Eosinophils Absolute: 0 10*3/uL (ref 0.0–0.7)
HCT: 42.2 % (ref 39.0–52.0)
HCT: 36.5 % — ABNORMAL LOW (ref 39.0–52.0)
Hemoglobin: 13.7 g/dL (ref 13.0–17.0)
Hemoglobin: 12.4 g/dL — ABNORMAL LOW (ref 13.0–17.0)
LYMPHS ABS: 1.5 10*3/uL (ref 0.7–4.0)
LYMPHS PCT: 19 %
LYMPHS PCT: 24 %
Lymphs Abs: 1.5 10*3/uL (ref 0.7–4.0)
MCH: 31.2 pg (ref 26.0–34.0)
MCH: 30.9 pg (ref 26.0–34.0)
MCHC: 32.5 g/dL (ref 30.0–36.0)
MCHC: 34 g/dL (ref 30.0–36.0)
MCV: 96.1 fL (ref 78.0–100.0)
MCV: 91 fL (ref 78.0–100.0)
MONO ABS: 0.6 10*3/uL (ref 0.1–1.0)
MONO ABS: 0.5 10*3/uL (ref 0.1–1.0)
MONOS PCT: 7 %
Monocytes Relative: 8 %
Neutro Abs: 5.9 10*3/uL (ref 1.7–7.7)
Neutro Abs: 4.3 10*3/uL (ref 1.7–7.7)
Neutrophils Relative %: 74 %
Neutrophils Relative %: 68 %
PLATELETS: 168 10*3/uL (ref 150–400)
PLATELETS: 153 10*3/uL (ref 150–400)
RBC: 4.39 MIL/uL (ref 4.22–5.81)
RBC: 4.01 MIL/uL — ABNORMAL LOW (ref 4.22–5.81)
RDW: 14.8 % (ref 11.5–15.5)
RDW: 14.5 % (ref 11.5–15.5)
WBC: 8 10*3/uL (ref 4.0–10.5)
WBC: 6.3 10*3/uL (ref 4.0–10.5)

## 2015-10-03 LAB — COMPREHENSIVE METABOLIC PANEL
ALBUMIN: 3.9 g/dL (ref 3.5–5.0)
ALBUMIN: 3.7 g/dL (ref 3.5–5.0)
ALK PHOS: 47 U/L (ref 38–126)
ALT: 14 U/L — AB (ref 17–63)
ALT: 12 U/L — AB (ref 17–63)
AST: 13 U/L — AB (ref 15–41)
AST: 11 U/L — AB (ref 15–41)
Alkaline Phosphatase: 51 U/L (ref 38–126)
Anion gap: 10 (ref 5–15)
Anion gap: 7 (ref 5–15)
BILIRUBIN TOTAL: 1.1 mg/dL (ref 0.3–1.2)
BUN: 26 mg/dL — AB (ref 6–20)
BUN: 23 mg/dL — AB (ref 6–20)
CALCIUM: 8.9 mg/dL (ref 8.9–10.3)
CHLORIDE: 111 mmol/L (ref 101–111)
CHLORIDE: 110 mmol/L (ref 101–111)
CO2: 23 mmol/L (ref 22–32)
CO2: 24 mmol/L (ref 22–32)
CREATININE: 1.32 mg/dL — AB (ref 0.61–1.24)
CREATININE: 1.07 mg/dL (ref 0.61–1.24)
Calcium: 9 mg/dL (ref 8.9–10.3)
GFR calc Af Amer: 53 mL/min — ABNORMAL LOW (ref >60)
GFR calc Af Amer: >60 mL/min (ref >60)
GFR, EST NON AFRICAN AMERICAN: 45 mL/min — AB (ref >60)
GFR, EST NON AFRICAN AMERICAN: 59 mL/min — AB (ref >60)
GLUCOSE: 125 mg/dL — AB (ref 65–99)
Glucose, Bld: 121 mg/dL — ABNORMAL HIGH (ref 65–99)
POTASSIUM: 4 mmol/L (ref 3.5–5.1)
Potassium: 4.2 mmol/L (ref 3.5–5.1)
SODIUM: 141 mmol/L (ref 135–145)
Sodium: 144 mmol/L (ref 135–145)
Total Bilirubin: 1 mg/dL (ref 0.3–1.2)
Total Protein: 6 g/dL — ABNORMAL LOW (ref 6.5–8.1)
Total Protein: 5.8 g/dL — ABNORMAL LOW (ref 6.5–8.1)

## 2015-10-03 LAB — MAGNESIUM: MAGNESIUM: 1.9 mg/dL (ref 1.7–2.4)

## 2015-10-03 LAB — APTT: aPTT: 38 seconds — ABNORMAL HIGH (ref 24–37)

## 2015-10-03 LAB — PHOSPHORUS: PHOSPHORUS: 3.4 mg/dL (ref 2.5–4.6)

## 2015-10-03 LAB — POC OCCULT BLOOD, ED: FECAL OCCULT BLD: POSITIVE — AB

## 2015-10-03 LAB — PROTIME-INR
INR: 2.77 — AB (ref 0.00–1.49)
PROTHROMBIN TIME: 28.8 s — AB (ref 11.6–15.2)

## 2015-10-03 MED ORDER — ONDANSETRON HCL 4 MG PO TABS: 4.0000 mg | ORAL_TABLET | Freq: Four times a day (QID) | ORAL | Status: DC | PRN

## 2015-10-03 MED ORDER — ATENOLOL 25 MG PO TABS
12.5000 mg | ORAL_TABLET | Freq: Two times a day (BID) | ORAL | Status: DC
  Administered 2015-10-03 – 2015-10-04 (×3): 12.5 mg via ORAL
  Filled 2015-10-03 (×4): qty 1

## 2015-10-03 MED ORDER — SODIUM CHLORIDE 0.9% FLUSH
3.0000 mL | Freq: Two times a day (BID) | INTRAVENOUS | Status: DC
  Administered 2015-10-03: 3 mL via INTRAVENOUS

## 2015-10-03 MED ORDER — FINASTERIDE 5 MG PO TABS
5.0000 mg | ORAL_TABLET | Freq: Every day | ORAL | Status: DC
  Administered 2015-10-04 – 2015-10-05 (×2): 5 mg via ORAL
  Filled 2015-10-03 (×2): qty 1

## 2015-10-03 MED ORDER — ONDANSETRON HCL 4 MG/2ML IJ SOLN: 4.0000 mg | Freq: Four times a day (QID) | INTRAMUSCULAR | Status: DC | PRN

## 2015-10-03 MED ORDER — PREDNISONE 5 MG PO TABS
10.0000 mg | ORAL_TABLET | Freq: Every day | ORAL | Status: DC
  Administered 2015-10-04 – 2015-10-05 (×2): 10 mg via ORAL
  Filled 2015-10-03 (×2): qty 2

## 2015-10-03 MED ORDER — GABAPENTIN 100 MG PO CAPS
100.0000 mg | ORAL_CAPSULE | Freq: Every day | ORAL | Status: DC
  Administered 2015-10-03 – 2015-10-04 (×2): 100 mg via ORAL
  Filled 2015-10-03 (×2): qty 1

## 2015-10-03 MED ORDER — TEMAZEPAM 15 MG PO CAPS
30.0000 mg | ORAL_CAPSULE | Freq: Every day | ORAL | Status: DC
  Administered 2015-10-03 – 2015-10-04 (×2): 30 mg via ORAL
  Filled 2015-10-03 (×2): qty 2

## 2015-10-03 MED ORDER — PANTOPRAZOLE SODIUM 40 MG IV SOLR
40.0000 mg | Freq: Two times a day (BID) | INTRAVENOUS | Status: DC
  Administered 2015-10-03 (×2): 40 mg via INTRAVENOUS
  Filled 2015-10-03 (×2): qty 40

## 2015-10-03 MED ORDER — SODIUM CHLORIDE 0.9 % IV SOLN: INTRAVENOUS | Status: AC

## 2015-10-03 MED ORDER — AMLODIPINE BESYLATE 10 MG PO TABS
10.0000 mg | ORAL_TABLET | Freq: Every day | ORAL | Status: DC
  Administered 2015-10-04 – 2015-10-05 (×2): 10 mg via ORAL
  Filled 2015-10-03 (×2): qty 1

## 2015-10-03 MED ORDER — ACETAMINOPHEN 500 MG PO TABS: 500.0000 mg | ORAL_TABLET | Freq: Four times a day (QID) | ORAL | Status: DC | PRN

## 2015-10-03 MED ORDER — TAMSULOSIN HCL 0.4 MG PO CAPS
0.4000 mg | ORAL_CAPSULE | Freq: Every day | ORAL | Status: DC
  Administered 2015-10-03 – 2015-10-04 (×2): 0.4 mg via ORAL
  Filled 2015-10-03 (×3): qty 1

## 2015-10-03 MED ORDER — SODIUM CHLORIDE 0.9 % IV SOLN
INTRAVENOUS | Status: DC
  Administered 2015-10-03 – 2015-10-04 (×2): via INTRAVENOUS

## 2015-10-03 NOTE — ED Notes (Signed)
Pt with 1 episode of BRBPR with clots, no change in neuro status or vitals

## 2015-10-03 NOTE — ED Notes (Signed)
Bed: BS96 Expected date:  Expected time:  Means of arrival:  Comments: EMS-bleed

## 2015-10-03 NOTE — Consult Note (Signed)
Referring Provider: EDP, Dr. Vanita Panda Primary Care Physician:  [name], MD Primary Gastroenterologist:  Althia Forts  Reason for Consultation:  GI bleed  HPI: Ricardo Perkins is a 82 y.o. male with past medical history significant for paroxysmal atrial fibrillation on anticoagulation with Coumadin, hypertension, Raynaud 's phenomenon, CKD stage 3 with baseline Cr 1.3, and COPD on home O2 with 3-4 Liters. Patient presented to [facility] with concern for multiple episodes of rectal bleeding.  This began suddenly 2 days ago.  Says that it just "turned on like a faucet".  No abdominal pain.  Last colonoscopy was several years ago in [place].  In ED, patient was hemodynamically stable. Blood work was essentially unremarkable other than creatinine of 1.3 which is around baseline values, INR 2.7. CT abdomen and pelvis with PO contrast only showed the following:    "IMPRESSION: Renal atrophy.  Basilar pulmonary fibrosis.  Distal colonic diverticulosis without definite evidence of acute diverticulitis the minimal hyperemia of the sigmoid mesocolon is noted, nonspecific.  Aneurysmal dilatation mid abdominal aorta 3.6 x 3.7 cm slightly increased since 2013.  Prostatic enlargement."  Hgb is normal at 13.7 grams, but is down a couple of grams compared to a few months ago.  Tells me that he just had a BM and it looked the bleeding was much less than it had been.   Past Medical History  Diagnosis Date  . INSOMNIA, PERSISTENT   . DECREASED HEARING   . HYPERTENSION   . BRADYCARDIA     1st degree AVB  . RENAL INSUFFICIENCY   . BENIGN PROSTATIC HYPERTROPHY, WITH OBSTRUCTION   . ASTEATOTIC ECZEMA   . LEG EDEMA, BILATERAL   . PAROXYSMAL ATRIAL FIBRILLATION     coumadin rx  . Arthritis     knees, shoulders   . COPD (chronic obstructive pulmonary disease) ([facility])     on chronic oxygen therapy qhs>daytime  . Advanced age   . Hx of echocardiogram     a. echo in 2009  (in [place]): normal EF, mild LVH;   b. echo 10/13:  mid Ant HK, mod LVH, EF 55%, trivial AI, mod LAE, mild RVE, mild reduced RVSF, PASP 46  . Hx of cardiovascular stress test     a.  MV 4/09:  possible small area of lateral ischemia at the apex, but Dr. Kirk Ruths reviewed this, and in fact, felt it was most likely normal.  b.  Myoview 3/13 was again low risk with an EF of 62% with small reversible defect in the apex suggestive of very mild apical ischemia.  . Large bilateral inguinal hernias - chronic, containing small bowel (right) and sigmoid colon (left) 01/15/2011    s/p B repair  . OA (osteoarthritis) of knee     S/p right TKR March '13 (Dr. Wynelle Link)   . RHINITIS, VASOMOTOR 01/07/2010    Past Surgical History  Procedure Laterality Date  . Appendectomy      @ 23  . Tonsillectomy    . Hernia repair  01/2011    bilateral inguinal hernia   . Eye surgery      bilateral cataract surgery   . Other surgical history      left finger surgery   . Knee surgery  right  . Total knee arthroplasty  10/26/2011    Procedure: TOTAL KNEE ARTHROPLASTY;  Surgeon: Gearlean Alf, MD;  Location: [facility];  Service: Orthopedics;  Laterality: Right;    Prior to Admission medications  Medication Sig Start Date End Date Taking? Authorizing Provider  acetaminophen (TYLENOL) 500 MG tablet Take 500 mg by mouth every 6 (six) hours as needed for moderate pain or headache.   Yes Historical Provider, MD  amLODipine (NORVASC) 10 MG tablet Take 1 tablet (10 mg total) by mouth daily. 06/10/15  Yes [name], MD  atenolol (TENORMIN) 25 MG tablet TAKE 1/2 TABLET BY MOUTH TWICE A DAY 12/03/14  Yes [name], MD  finasteride (PROSCAR) 5 MG tablet Take 1 tablet (5 mg total) by mouth daily. 08/19/15  Yes [name], MD  furosemide (LASIX) 20 MG tablet Take 2 tablets (40 mg total) by mouth daily. TAKE ONE TABLET DAILY AS NEEDED Patient taking differently: Take 20-40 mg by mouth daily as  needed for fluid or edema.  05/19/14  Yes Lelon Perla, MD  lidocaine (LIDODERM) 5 % Place 1 patch onto the skin daily. Remove & Discard patch within 12 hours or as directed by MD 09/13/15  Yes [name], MD  omeprazole (PRILOSEC) 20 MG capsule TAKE ONE CAPSULE BY MOUTH EVERY DAY AS NEEDED Patient taking differently: TAKE ONE CAPSULE BY MOUTH EVERY DAY AS NEEDED FOR HEARTBURN OR INDIGESTION 06/10/15  Yes [name], MD  predniSONE (DELTASONE) 10 MG tablet TAKE 1 TABLET BY MOUTH EVERY DAY WITH BREAKFAST 09/11/15  Yes Tanda Rockers, MD  tamsulosin (FLOMAX) 0.4 MG CAPS capsule Take 1 capsule (0.4 mg total) by mouth daily after supper. --- Pt needs appt for further refills 08/16/15  Yes [name], MD  temazepam (RESTORIL) 30 MG capsule TAKE 1 CAPSULE BY MOUTH EVERY NIGHT AT BEDTIME 10/02/15  Yes [name], MD  traMADol (ULTRAM) 50 MG tablet Take 1 tablet (50 mg total) by mouth every 8 (eight) hours as needed. 06/24/15  Yes [name], MD  warfarin (COUMADIN) 3 MG tablet Take as directed by anticoagulation clinic Patient taking differently: Take 1.5-3 mg by mouth as directed. Take 3 mgs on Sundays and Thursday take 1.5 mgs all other days 01/02/15  Yes [name], MD  gabapentin (NEURONTIN) 100 MG capsule Take 1 capsule (100 mg total) by mouth at bedtime. Can take 2 capsules at night after first 7 days 08/30/15   Rubbie Battiest, NP    Current Facility-Administered Medications  Medication Dose Route Frequency Provider Last Rate Last Dose  . 0.9 %  sodium chloride infusion   Intravenous Continuous Robbie Lis, MD 75 mL/hr at 10/03/15 1227    . ondansetron (ZOFRAN) tablet 4 mg  4 mg Oral Q6H PRN Robbie Lis, MD       Or  . ondansetron [facility]) injection 4 mg  4 mg Intravenous Q6H PRN Robbie Lis, MD      . pantoprazole (PROTONIX) injection 40 mg  40 mg Intravenous Q12H Robbie Lis, MD   40 mg at 10/03/15 1227   Current Outpatient  Prescriptions  Medication Sig Dispense Refill  . acetaminophen (TYLENOL) 500 MG tablet Take 500 mg by mouth every 6 (six) hours as needed for moderate pain or headache.    Marland Kitchen amLODipine (NORVASC) 10 MG tablet Take 1 tablet (10 mg total) by mouth daily. 90 tablet 1  . atenolol (TENORMIN) 25 MG tablet TAKE 1/2 TABLET BY MOUTH TWICE A DAY 90 tablet 3  . finasteride (PROSCAR) 5 MG tablet Take 1 tablet (5 mg total) by mouth daily. 90 tablet 1  . furosemide (LASIX) 20 MG tablet  Take 2 tablets (40 mg total) by mouth daily. TAKE ONE TABLET DAILY AS NEEDED (Patient taking differently: Take 20-40 mg by mouth daily as needed for fluid or edema. ) 180 tablet 2  . lidocaine (LIDODERM) 5 % Place 1 patch onto the skin daily. Remove & Discard patch within 12 hours or as directed by MD 60 patch 6  . omeprazole (PRILOSEC) 20 MG capsule TAKE ONE CAPSULE BY MOUTH EVERY DAY AS NEEDED (Patient taking differently: TAKE ONE CAPSULE BY MOUTH EVERY DAY AS NEEDED FOR HEARTBURN OR INDIGESTION) 90 capsule 1  . predniSONE (DELTASONE) 10 MG tablet TAKE 1 TABLET BY MOUTH EVERY DAY WITH BREAKFAST 30 tablet 2  . tamsulosin (FLOMAX) 0.4 MG CAPS capsule Take 1 capsule (0.4 mg total) by mouth daily after supper. --- Pt needs appt for further refills 90 capsule 0  . temazepam (RESTORIL) 30 MG capsule TAKE 1 CAPSULE BY MOUTH EVERY NIGHT AT BEDTIME 90 capsule 0  . traMADol (ULTRAM) 50 MG tablet Take 1 tablet (50 mg total) by mouth every 8 (eight) hours as needed. 30 tablet 3  . warfarin (COUMADIN) 3 MG tablet Take as directed by anticoagulation clinic (Patient taking differently: Take 1.5-3 mg by mouth as directed. Take 3 mgs on Sundays and Thursday take 1.5 mgs all other days) 110 tablet 3  . gabapentin (NEURONTIN) 100 MG capsule Take 1 capsule (100 mg total) by mouth at bedtime. Can take 2 capsules at night after first 7 days 60 capsule 0    Allergies as of 10/03/2015  . (No Known Allergies)    Family History  Problem Relation Age  of Onset  . Heart disease Father   . Heart attack Father   . Cancer Mother     colon   . Cirrhosis Sister     liver failure  . Heart attack Brother   . Diabetes Neg Hx     Social History   Social History  . Marital Status: Single    Spouse Name: N/A  . Number of Children: N/A  . Years of Education: N/A   Occupational History  . Not on file.   Social History Main Topics  . Smoking status: Former Smoker -- 1.00 packs/day for 30 years    Types: Cigarettes    Quit date: 07/20/1998  . Smokeless tobacco: Never Used  . Alcohol Use: 0.0 oz/week    0 Standard drinks or equivalent per week     Comment: wine occasional   . Drug Use: No  . Sexual Activity: Not Currently   Other Topics Concern  . 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  Physical Exam: Vital signs in last 24 hours: Temp:  [97.6 F (36.4 C)] 97.6 F (36.4 C) (03/16 0742) Pulse Rate:  [54-59] 57 (03/16 1200) Resp:  [16-21] 21 (03/16 1200) BP: (117-140)/(71-84) 117/73 mmHg (03/16 1200) SpO2:  [94 %-96 %] 95 % (03/16 1200) Weight:  [184 lb (83.462 kg)] 184 lb (83.462 kg) (03/16 0742)   General:  Alert, Well-developed, well-nourished, pleasant and cooperative in NAD Head:  Normocephalic and atraumatic. Eyes:  Sclera clear, no icterus.  Conjunctiva pink. Ears:  Normal auditory acuity. Mouth:  No deformity or lesions.   Lungs:  Clear throughout to auscultation.  No wheezes, crackles, or rhonchi.  Heart:  Bradycardic.  No murmurs noted. Abdomen:  Soft, non-distended.  BS present.  Non-tender. Rectal:  Deferred  Msk:  Symmetrical without gross deformities.  Pulses:  Normal pulses noted. Extremities:  Without clubbing or edema. Neurologic:  Alert and  oriented x 4;  grossly normal neurologically. Skin:  Intact without significant lesions or rashes. Psych:  Alert and cooperative. Normal mood and affect.  Lab Results:  Recent Labs  10/03/15 0757  WBC 8.0  HGB 13.7  HCT 42.2  PLT 168   BMET  Recent Labs  10/03/15 0757  NA 144  K 4.0  CL 111  CO2 23  GLUCOSE 125*  BUN 26*  CREATININE 1.32*  CALCIUM 9.0   LFT  Recent Labs  10/03/15 0757  PROT 6.0*  ALBUMIN 3.9  AST 13*  ALT 14*  ALKPHOS 51  BILITOT 1.0   PT/INR  Recent Labs  10/03/15 0757  LABPROT 28.8*  INR 2.77*   Studies/Results: Ct Abdomen Pelvis Wo Contrast  10/03/2015  CLINICAL DATA:  Aneurysmal dilatation of abdominal aorta EXAM: CT ABDOMEN AND PELVIS WITHOUT CONTRAST TECHNIQUE: Multidetector CT imaging of the abdomen and pelvis was performed following the standard protocol without IV contrast. Sagittal and coronal MPR images reconstructed from axial data set. Patient drank dilute oral contrast for exam COMPARISON:  02/26/2012 FINDINGS: Bibasilar pulmonary fibrosis. Within limits of a nonenhanced exam no focal abnormalities of the liver, spleen, pancreas, or adrenals. BILATERAL renal cortical atrophy without gross mass or hydronephrosis. Scattered atherosclerotic calcifications including coronary arteries. Aneurysmal dilatation mid abdominal aorta 3.6 x 3.7 cm image 42 previously 3.5 x 3.2 cm. Common iliac arteries mildly dilated as well, 14 mm RIGHT and 15 mm LEFT. Unremarkable bladder and ureters. Mild prostatic enlargement gland 4.5 x 3.8 cm image 87. Contracted gallbladder. Post appendectomy. Descending and sigmoid diverticulosis without definite wall thickening or pericolic inflammatory changes. Slight hyperemia of the sigmoid mesocolon is noted however. Stomach and bowel loops otherwise normal appearance. No mass, adenopathy, free air or free fluid. Bones demineralized. IMPRESSION: Renal atrophy. Basilar pulmonary fibrosis. Distal colonic diverticulosis  without definite evidence of acute diverticulitis the minimal hyperemia of the sigmoid mesocolon is noted, nonspecific. Aneurysmal dilatation mid abdominal aorta 3.6 x 3.7 cm slightly increased since 2013. Prostatic enlargement. Electronically Signed   By: Lavonia Dana M.D.   On: 10/03/2015 10:23   IMPRESSION:  -GI bleed, lower:  Sudden onset, painless bleeding.  Likely diverticular.  CT scan showed distal colonic diverticulosis.  Bleeding seems to be slowing. -Coumadin coagulopathy:  INR within therapeutic range. -Atrial fibrillation for which he has been on coumadin for several years -COPD on home O2, 3-4 Liters per day.  PLAN: -Patient being admitted.  Will monitor for now.  Monitor Hgb and transfuse prn.  Nuc med bleeding scan if patient re-bleeds briskly.  Likely no colonoscopy since patient higher risk due to advanced age and requiring O2 at home.  Also CT scan with PO contrast does not indicate any masses or other worrisome findings.  Clear liquids for now.  ZEHR, JESSICA D.  10/03/2015, 12:55 PM  Pager number [phone]     ________________________________________________________________________  Velora Heckler GI MD note:  I personally examined the patient, reviewed the data and agree with the assessment and plan described above.  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  If bleeding stops, as it usually does with brief cessation of coumadin, he is NOT interested in colonoscopy.  If bleeding does not stop however, he understands that colonoscopy can be helpful to locate and stop the bleeding.   Owens Loffler, MD [facility] Pager [phone]

## 2015-10-03 NOTE — H&P (Signed)
[facility] History and Physical  KEEN EWALT WNU:[id number] DOB: [birth year] DOA: 10/03/2015  Referring physician: ER physician: Dr. Davonna Belling  PCP: [name], MD  Chief Complaint: rectal  HPI:  82 year old male with past medical history significant for paroxysmal atrial fibrillation on anticoagulation with Coumadin, hypertension, Raynaud 's phenomenon, CKD stage 3 with baseline Cr 1.3. Patient presented to [facility] with concern for multiple episodes of blood per rectum with or without bowel movement. Patient reports this started over last 24 hours. No pain with bleeding. Patient reports no abdominal pain, nausea or vomiting. No lightheadedness or loss of consciousness. No reports of fevers., No chest pain or shortness of breath or palpitations. No urinary complaints.  In ED, patient was hemodynamically stable. Blood work was essentially unremarkable other than creatinine of 1.3 which is around baseline values, INR 2.7. CT abdomen showed basilar pulmonary fibrosis, distal colonic diverticulosis without acute diverticulitis, aneurysmal dilation slightly increased since 2013, prostatic enlargement. Patient was started on Protonix IV 40 mg every 12 hours. GI will see the patient in consultation.  Assessment & Plan    Principal Problem:   Acute lower GI bleeding  - Probably diverticular bleed - Likely triggered by Coumadin even though INR is at therapeutic level - Hold Coumadin - Continue supportive care with IV fluids - Continue with Protonix 40 mg IV every 12 hours - GI consulted, appreciated their recommendations - Monitor on telemetry  Active Problems:   Essential hypertension - Continue atenolol and Norvasc - Lasix on hold until we repeat BMP and make sure that creatinine is stable and at baseline, blood pressure 127/75    Raynaud phenomenon / Postinflammatory pulmonary fibrosis (HCC) - Continue low-dose prednisone if okay with GI    Post  herpetic neuralgia - Continue gabapentin    PAROXYSMAL ATRIAL FIBRILLATION - CHADS vasc score 2 - Holding Coumadin because of bleeding - Rate controlled with low-dose atenolol    CKD (chronic kidney disease) stage 3, GFR 30-59 ml/min - Baseline Cr 1.3 about 1 year ago - Cr on this admission at baseline values     BPH (benign prostatic hyperplasia) - Continue Flomax   DVT prophylaxis:  - SCD's due to risk of bleeding   Radiological Exams on Admission: Ct Abdomen Pelvis Wo Contrast 10/03/2015  Renal atrophy. Basilar pulmonary fibrosis. Distal colonic diverticulosis without definite evidence of acute diverticulitis the minimal hyperemia of the sigmoid mesocolon is noted, nonspecific. Aneurysmal dilatation mid abdominal aorta 3.6 x 3.7 cm slightly increased since 2013. Prostatic enlargement. Electronically Signed   By: Lavonia Dana M.D.   On: 10/03/2015 10:23    EKG: not done in ED, order placed for 12 lead EKG  Code Status: Full Family Communication: Plan of care discussed with the patient  Disposition Plan: Admit for further evaluation, telemetry admission   Leisa Lenz, MD  [facility] Pager [phone]  Time spent in minutes: 75 minutes  Review of Systems:  Constitutional: Negative for fever, chills and malaise/fatigue. Negative for diaphoresis.  HENT: Negative for hearing loss, ear pain, nosebleeds, congestion, sore throat, neck pain, tinnitus and ear discharge.   Eyes: Negative for blurred vision, double vision, photophobia, pain, discharge and redness.  Respiratory: Negative for cough, hemoptysis, sputum production, shortness of breath, wheezing and stridor.   Cardiovascular: Negative for chest pain, palpitations, orthopnea, claudication and leg swelling.  Gastrointestinal: per HPI Genitourinary: Negative for dysuria, urgency, frequency, hematuria and flank pain.  Musculoskeletal: Negative for myalgias, back pain, joint pain and falls.  Skin: Negative for itching and  rash.  Neurological: Negative for dizziness and weakness. Negative for tingling, tremors, sensory change, speech change, focal weakness, loss of consciousness and headaches.  Endo/Heme/Allergies: Negative for environmental allergies and polydipsia. Does not bruise/bleed easily.  Psychiatric/Behavioral: Negative for suicidal ideas. The patient is not nervous/anxious.      Past Medical History  Diagnosis Date  . INSOMNIA, PERSISTENT   . DECREASED HEARING   . HYPERTENSION   . BRADYCARDIA     1st degree AVB  . RENAL INSUFFICIENCY   . BENIGN PROSTATIC HYPERTROPHY, WITH OBSTRUCTION   . ASTEATOTIC ECZEMA   . LEG EDEMA, BILATERAL   . PAROXYSMAL ATRIAL FIBRILLATION     coumadin rx  . Arthritis     knees, shoulders   . COPD (chronic obstructive pulmonary disease) (HCC)     on chronic oxygen therapy qhs>daytime  . Advanced age   . Hx of echocardiogram     a. echo in 2009 (in [place]): normal EF, mild LVH;   b. echo 10/13:  mid Ant HK, mod LVH, EF 55%, trivial AI, mod LAE, mild RVE, mild reduced RVSF, PASP 46  . Hx of cardiovascular stress test     a.  MV 4/09:  possible small area of lateral ischemia at the apex, but Dr. Kirk Ruths reviewed this, and in fact, felt it was most likely normal.  b.  Myoview 3/13 was again low risk with an EF of 62% with small reversible defect in the apex suggestive of very mild apical ischemia.  . Large bilateral inguinal hernias - chronic, containing small bowel (right) and sigmoid colon (left) 01/15/2011    s/p B repair  . OA (osteoarthritis) of knee     S/p right TKR March '13 (Dr. Wynelle Link)   . RHINITIS, VASOMOTOR 01/07/2010   Past Surgical History  Procedure Laterality Date  . Appendectomy      @ 23  . Tonsillectomy    . Hernia repair  01/2011    bilateral inguinal hernia   . Eye surgery      bilateral cataract surgery   . Other surgical history      left finger surgery   . Knee surgery  right  . Total knee arthroplasty  10/26/2011    Procedure:  TOTAL KNEE ARTHROPLASTY;  Surgeon: Gearlean Alf, MD;  Location: [facility];  Service: Orthopedics;  Laterality: Right;   Social History:  reports that he quit smoking about 17 years ago. His smoking use included Cigarettes. He has a 30 pack-year smoking history. He has never used smokeless tobacco. He reports that he drinks alcohol. He reports that he does not use illicit drugs.  No Known Allergies  Family History:  Family History  Problem Relation Age of Onset  . Heart disease Father   . Heart attack Father   . Cancer Mother     colon   . Cirrhosis Sister     liver failure  . Heart attack Brother   . Diabetes Neg Hx      Prior to Admission medications   Medication Sig Start Date End Date Taking? Authorizing Provider  acetaminophen (TYLENOL) 500 MG tablet Take 500 mg by mouth every 6 (six) hours as needed for moderate pain or headache.   Yes Historical Provider, MD  amLODipine (NORVASC) 10 MG tablet Take 1 tablet (10 mg total) by mouth daily. 06/10/15  Yes [name], MD  atenolol (TENORMIN) 25 MG tablet TAKE 1/2 TABLET BY MOUTH TWICE A  DAY 12/03/14  Yes [name], MD  finasteride (PROSCAR) 5 MG tablet Take 1 tablet (5 mg total) by mouth daily. 08/19/15  Yes [name], MD  furosemide (LASIX) 20 MG tablet Take 2 tablets (40 mg total) by mouth daily. TAKE ONE TABLET DAILY AS NEEDED Patient taking differently: Take 20-40 mg by mouth daily as needed for fluid or edema.  05/19/14  Yes Lelon Perla, MD  omeprazole (PRILOSEC) 20 MG capsule TAKE ONE CAPSULE BY MOUTH EVERY DAY AS NEEDED Patient taking differently: TAKE ONE CAPSULE BY MOUTH EVERY DAY AS NEEDED FOR HEARTBURN OR INDIGESTION 06/10/15  Yes [name], MD  predniSONE (DELTASONE) 10 MG tablet TAKE 1 TABLET BY MOUTH EVERY DAY WITH BREAKFAST 09/11/15  Yes Tanda Rockers, MD  tamsulosin (FLOMAX) 0.4 MG CAPS capsule Take 1 capsule (0.4 mg total) by mouth daily after supper. --- Pt needs appt for  further refills 08/16/15  Yes [name], MD  temazepam (RESTORIL) 30 MG capsule TAKE 1 CAPSULE BY MOUTH EVERY NIGHT AT BEDTIME 10/02/15  Yes [name], MD  traMADol (ULTRAM) 50 MG tablet Take 1 tablet (50 mg total) by mouth every 8 (eight) hours as needed. 06/24/15  Yes [name], MD  warfarin (COUMADIN) 3 MG tablet Take as directed by anticoagulation clinic Patient taking differently: Take 1.5-3 mg by mouth as directed. Take 3 mgs on Sundays and Thursday take 1.5 mgs all other days 01/02/15  Yes [name], MD  gabapentin (NEURONTIN) 100 MG capsule Take 1 capsule (100 mg total) by mouth at bedtime. Can take 2 capsules at night after first 7 days 08/30/15   Rubbie Battiest, NP   Physical Exam: Filed Vitals:   10/03/15 0742 10/03/15 1024  BP: 140/84 127/75  Pulse: 59 54  Temp: 97.6 F (36.4 C)   TempSrc: Oral   Resp: 16 18  Height: '5\' 7"'$  (1.702 m)   Weight: 83.462 kg (184 lb)   SpO2: 94% 95%    Physical Exam  Constitutional: Appears well-developed and well-nourished. No distress.  HENT: Normocephalic. No tonsillar erythema or exudates Eyes: Conjunctivae are normal. No scleral icterus.  Neck: Normal ROM. Neck supple. No JVD. No tracheal deviation. No thyromegaly.  CVS: bradycardia, S1/S2 appreciated   Pulmonary: Effort and breath sounds normal, no stridor, rhonchi, wheezes, rales.  Abdominal: Soft. BS +,  no distension, tenderness, rebound or guarding.  Musculoskeletal: Normal range of motion. No edema and no tenderness.  Lymphadenopathy: No lymphadenopathy noted, cervical, inguinal. Neuro: Alert. Normal reflexes, muscle tone coordination. No focal neurologic deficits. Skin: Skin is warm and dry. No rash noted.  No erythema. No pallor.  Psychiatric: Normal mood and affect. Behavior, judgment, thought content normal.   Labs on Admission:  Basic Metabolic Panel:  Recent Labs Lab 10/03/15 0757  NA 144  K 4.0  CL 111  CO2 23  GLUCOSE 125*   BUN 26*  CREATININE 1.32*  CALCIUM 9.0   Liver Function Tests:  Recent Labs Lab 10/03/15 0757  AST 13*  ALT 14*  ALKPHOS 51  BILITOT 1.0  PROT 6.0*  ALBUMIN 3.9   No results for input(s): LIPASE, AMYLASE in the last 168 hours. No results for input(s): AMMONIA in the last 168 hours. CBC:  Recent Labs Lab 10/03/15 0757  WBC 8.0  NEUTROABS 5.9  HGB 13.7  HCT 42.2  MCV 96.1  PLT 168   Cardiac Enzymes: No results for input(s): CKTOTAL, CKMB, CKMBINDEX, TROPONINI in the  last 168 hours. BNP: Invalid input(s): POCBNP CBG: No results for input(s): GLUCAP in the last 168 hours.  If 7PM-7AM, please contact night-coverage [email or web address] Password [email or web address] 10/03/2015, 11:40 AM

## 2015-10-03 NOTE — ED Notes (Signed)
[facility] from home, reports 3 episodes of dark red blood PR this AM, (coumadin), denies abd pain V/D/D/F, A/O X4, no pain, VSS

## 2015-10-03 NOTE — ED Notes (Addendum)
20 minute time take up at 16:08

## 2015-10-03 NOTE — ED Provider Notes (Signed)
CSN: [id number]     Arrival date & time 10/03/15  3329 History   First MD Initiated Contact with Patient 10/03/15 [phone]     Chief Complaint  Patient presents with  . Rectal Bleeding     HPI  Elderly male presents with concern of rectal bleeding. Over the past day the patient has had multiple episodes of blood per rectum, with minimal stool production. He denies lightheadedness, syncope, chest pain, dyspnea. He does have ongoing right-sided abdominal pain, though this is an ongoing issue, and is not notably changed over the past day. Patient notes that over the past weeks he has had change in the consistency of his bowel movements, and has been taking Imodium, every other day. Until today, no prior rectal bleeding. Patient is on Coumadin, with history of atrial fibrillation.   Past Medical History  Diagnosis Date  . INSOMNIA, PERSISTENT   . DECREASED HEARING   . HYPERTENSION   . BRADYCARDIA     1st degree AVB  . RENAL INSUFFICIENCY   . BENIGN PROSTATIC HYPERTROPHY, WITH OBSTRUCTION   . ASTEATOTIC ECZEMA   . LEG EDEMA, BILATERAL   . PAROXYSMAL ATRIAL FIBRILLATION     coumadin rx  . Arthritis     knees, shoulders   . COPD (chronic obstructive pulmonary disease) (HCC)     on chronic oxygen therapy qhs>daytime  . Advanced age   . Hx of echocardiogram     a. echo in 2009 (in [place]): normal EF, mild LVH;   b. echo 10/13:  mid Ant HK, mod LVH, EF 55%, trivial AI, mod LAE, mild RVE, mild reduced RVSF, PASP 46  . Hx of cardiovascular stress test     a.  MV 4/09:  possible small area of lateral ischemia at the apex, but Dr. Kirk Ruths reviewed this, and in fact, felt it was most likely normal.  b.  Myoview 3/13 was again low risk with an EF of 62% with small reversible defect in the apex suggestive of very mild apical ischemia.  . Large bilateral inguinal hernias - chronic, containing small bowel (right) and sigmoid colon (left) 01/15/2011    s/p B repair  . OA (osteoarthritis) of  knee     S/p right TKR March '13 (Dr. Wynelle Link)   . RHINITIS, VASOMOTOR 01/07/2010   Past Surgical History  Procedure Laterality Date  . Appendectomy      @ 23  . Tonsillectomy    . Hernia repair  01/2011    bilateral inguinal hernia   . Eye surgery      bilateral cataract surgery   . Other surgical history      left finger surgery   . Knee surgery  right  . Total knee arthroplasty  10/26/2011    Procedure: TOTAL KNEE ARTHROPLASTY;  Surgeon: Gearlean Alf, MD;  Location: [facility];  Service: Orthopedics;  Laterality: Right;   Family History  Problem Relation Age of Onset  . Heart disease Father   . Heart attack Father   . Cancer Mother     colon   . Cirrhosis Sister     liver failure  . Heart attack Brother   . Diabetes Neg Hx    Social History  Substance Use Topics  . Smoking status: Former Smoker -- 1.00 packs/day for 30 years    Types: Cigarettes    Quit date: 07/20/1998  . Smokeless tobacco: Never Used  . Alcohol Use: 0.0 oz/week    0 Standard drinks or  equivalent per week     Comment: wine occasional     Review of Systems  Constitutional:       Per HPI, otherwise negative  HENT:       Per HPI, otherwise negative  Respiratory:       Per HPI, otherwise negative  Cardiovascular:       Per HPI, otherwise negative  Gastrointestinal: Positive for constipation and anal bleeding. Negative for nausea, vomiting and abdominal distention.  Endocrine:       Negative aside from HPI  Genitourinary:       Neg aside from HPI   Musculoskeletal:       Per HPI, otherwise negative  Skin:       R lateral abdominal wall scar  Neurological: Negative for syncope and weakness.      Allergies  Review of patient's allergies indicates no known allergies.  Home Medications   Prior to Admission medications   Medication Sig Start Date End Date Taking? Authorizing Provider  acetaminophen (TYLENOL) 500 MG tablet Take 500 mg by mouth every 6 (six) hours as needed for moderate pain  or headache.   Yes Historical Provider, MD  amLODipine (NORVASC) 10 MG tablet Take 1 tablet (10 mg total) by mouth daily. 06/10/15  Yes [name], MD  atenolol (TENORMIN) 25 MG tablet TAKE 1/2 TABLET BY MOUTH TWICE A DAY 12/03/14  Yes [name], MD  finasteride (PROSCAR) 5 MG tablet Take 1 tablet (5 mg total) by mouth daily. 08/19/15  Yes [name], MD  furosemide (LASIX) 20 MG tablet Take 2 tablets (40 mg total) by mouth daily. TAKE ONE TABLET DAILY AS NEEDED Patient taking differently: Take 20-40 mg by mouth daily as needed for fluid or edema.  05/19/14  Yes Lelon Perla, MD  lidocaine (LIDODERM) 5 % Place 1 patch onto the skin daily. Remove & Discard patch within 12 hours or as directed by MD 09/13/15  Yes [name], MD  omeprazole (PRILOSEC) 20 MG capsule TAKE ONE CAPSULE BY MOUTH EVERY DAY AS NEEDED Patient taking differently: TAKE ONE CAPSULE BY MOUTH EVERY DAY AS NEEDED FOR HEARTBURN OR INDIGESTION 06/10/15  Yes [name], MD  predniSONE (DELTASONE) 10 MG tablet TAKE 1 TABLET BY MOUTH EVERY DAY WITH BREAKFAST 09/11/15  Yes Tanda Rockers, MD  tamsulosin (FLOMAX) 0.4 MG CAPS capsule Take 1 capsule (0.4 mg total) by mouth daily after supper. --- Pt needs appt for further refills 08/16/15  Yes [name], MD  temazepam (RESTORIL) 30 MG capsule TAKE 1 CAPSULE BY MOUTH EVERY NIGHT AT BEDTIME 10/02/15  Yes [name], MD  traMADol (ULTRAM) 50 MG tablet Take 1 tablet (50 mg total) by mouth every 8 (eight) hours as needed. 06/24/15  Yes [name], MD  warfarin (COUMADIN) 3 MG tablet Take as directed by anticoagulation clinic Patient taking differently: Take 1.5-3 mg by mouth as directed. Take 3 mgs on Sundays and Thursday take 1.5 mgs all other days 01/02/15  Yes [name], MD  gabapentin (NEURONTIN) 100 MG capsule Take 1 capsule (100 mg total) by mouth at bedtime. Can take 2 capsules at night after first 7  days 08/30/15   Rubbie Battiest, NP   BP 127/75 mmHg  Pulse 54  Temp(Src) 97.6 F (36.4 C) (Oral)  Resp 18  Ht '5\' 7"'$  (1.702 m)  Wt 184 lb (83.462 kg)  BMI 28.81 kg/m2  SpO2 95% Physical Exam  Constitutional: He  is oriented to person, place, and time. He appears well-developed. No distress.  HENT:  Head: Normocephalic and atraumatic.  Eyes: Conjunctivae and EOM are normal.  Cardiovascular: Normal rate.  An irregularly irregular rhythm present.  Pulmonary/Chest: Effort normal. No stridor. No respiratory distress.  Abdominal: He exhibits no distension.  Genitourinary: Rectal exam shows no external hemorrhoid, no fissure and no tenderness. Guaiac positive stool.  Musculoskeletal: He exhibits no edema.  Neurological: He is alert and oriented to person, place, and time.  Skin: Skin is warm and dry.  Psychiatric: He has a normal mood and affect.  Nursing note and vitals reviewed.   ED Course  Procedures (including critical care time) Labs Review Labs Reviewed  PROTIME-INR - Abnormal; Notable for the following:    Prothrombin Time 28.8 (*)    INR 2.77 (*)    All other components within normal limits  COMPREHENSIVE METABOLIC PANEL - Abnormal; Notable for the following:    Glucose, Bld 125 (*)    BUN 26 (*)    Creatinine, Ser 1.32 (*)    Total Protein 6.0 (*)    AST 13 (*)    ALT 14 (*)    GFR calc non Af Amer 45 (*)    GFR calc Af Amer 53 (*)    All other components within normal limits  POC OCCULT BLOOD, ED - Abnormal; Notable for the following:    Fecal Occult Bld POSITIVE (*)    All other components within normal limits  CBC WITH DIFFERENTIAL/PLATELET    Imaging Review Ct Abdomen Pelvis Wo Contrast  10/03/2015  CLINICAL DATA:  Aneurysmal dilatation of abdominal aorta EXAM: CT ABDOMEN AND PELVIS WITHOUT CONTRAST TECHNIQUE: Multidetector CT imaging of the abdomen and pelvis was performed following the standard protocol without IV contrast. Sagittal and coronal MPR images  reconstructed from axial data set. Patient drank dilute oral contrast for exam COMPARISON:  02/26/2012 FINDINGS: Bibasilar pulmonary fibrosis. Within limits of a nonenhanced exam no focal abnormalities of the liver, spleen, pancreas, or adrenals. BILATERAL renal cortical atrophy without gross mass or hydronephrosis. Scattered atherosclerotic calcifications including coronary arteries. Aneurysmal dilatation mid abdominal aorta 3.6 x 3.7 cm image 42 previously 3.5 x 3.2 cm. Common iliac arteries mildly dilated as well, 14 mm RIGHT and 15 mm LEFT. Unremarkable bladder and ureters. Mild prostatic enlargement gland 4.5 x 3.8 cm image 87. Contracted gallbladder. Post appendectomy. Descending and sigmoid diverticulosis without definite wall thickening or pericolic inflammatory changes. Slight hyperemia of the sigmoid mesocolon is noted however. Stomach and bowel loops otherwise normal appearance. No mass, adenopathy, free air or free fluid. Bones demineralized. IMPRESSION: Renal atrophy. Basilar pulmonary fibrosis. Distal colonic diverticulosis without definite evidence of acute diverticulitis the minimal hyperemia of the sigmoid mesocolon is noted, nonspecific. Aneurysmal dilatation mid abdominal aorta 3.6 x 3.7 cm slightly increased since 2013. Prostatic enlargement. Electronically Signed   By: Lavonia Dana M.D.   On: 10/03/2015 10:23   I have personally reviewed and evaluated these images and lab results as part of my medical decision-making.  Initial labs notable for Hemoccult-positive status, mild renal insufficiency  11:07 AM On repeat exam the patient remains in similar condition. Patient is actively bleeding. I discussed all findings the patient and to family members. With concern for ongoing bleeding, patient will be admitted for further evaluation and management.  I discussed the patient's case with our gastroenterology colleagues.    MDM  Elderly male presents with rectal bleeding. Here, the  patient is awake and alert,  but has continued rectal bleeding. Patient is anticoagulated due to history of atrial fibrillation. CT scan consistent with possible diverticulosis as causative etiology. Given the patient's ongoing bleed, and the needed to address his need for anticoagulation given his cardiac history, he was admitted for further evaluation and management.  Carmin Muskrat, MD 10/03/15 1116

## 2015-10-04 DIAGNOSIS — K922 Gastrointestinal hemorrhage, unspecified: Secondary | ICD-10-CM | POA: Diagnosis not present

## 2015-10-04 DIAGNOSIS — N183 Chronic kidney disease, stage 3 (moderate): Secondary | ICD-10-CM | POA: Diagnosis not present

## 2015-10-04 DIAGNOSIS — N400 Enlarged prostate without lower urinary tract symptoms: Secondary | ICD-10-CM | POA: Diagnosis not present

## 2015-10-04 DIAGNOSIS — I480 Paroxysmal atrial fibrillation: Secondary | ICD-10-CM | POA: Diagnosis not present

## 2015-10-04 LAB — CBC
HCT: 35.1 % — ABNORMAL LOW (ref 39.0–52.0)
HEMOGLOBIN: 11.8 g/dL — AB (ref 13.0–17.0)
MCH: 31.1 pg (ref 26.0–34.0)
MCHC: 33.6 g/dL (ref 30.0–36.0)
MCV: 92.6 fL (ref 78.0–100.0)
Platelets: 141 10*3/uL — ABNORMAL LOW (ref 150–400)
RBC: 3.79 MIL/uL — AB (ref 4.22–5.81)
RDW: 14.8 % (ref 11.5–15.5)
WBC: 5.6 10*3/uL (ref 4.0–10.5)

## 2015-10-04 LAB — BASIC METABOLIC PANEL
Anion gap: 9 (ref 5–15)
BUN: 20 mg/dL (ref 6–20)
CHLORIDE: 109 mmol/L (ref 101–111)
CO2: 26 mmol/L (ref 22–32)
Calcium: 8.7 mg/dL — ABNORMAL LOW (ref 8.9–10.3)
Creatinine, Ser: 1.25 mg/dL — ABNORMAL HIGH (ref 0.61–1.24)
GFR calc Af Amer: 56 mL/min — ABNORMAL LOW (ref >60)
GFR calc non Af Amer: 49 mL/min — ABNORMAL LOW (ref >60)
GLUCOSE: 95 mg/dL (ref 65–99)
POTASSIUM: 3.7 mmol/L (ref 3.5–5.1)
Sodium: 144 mmol/L (ref 135–145)

## 2015-10-04 MED ORDER — PANTOPRAZOLE SODIUM 40 MG PO TBEC
40.0000 mg | DELAYED_RELEASE_TABLET | Freq: Every day | ORAL | Status: DC
  Administered 2015-10-04 – 2015-10-05 (×2): 40 mg via ORAL
  Filled 2015-10-04 (×2): qty 1

## 2015-10-04 NOTE — Progress Notes (Signed)
[facility] Progress Note    Since last GI note: No overt bleeding since 2-3pm yesterday (about 18 hours).  Feels well, slept well overnight.  Eager to advance his diet.  No abd pains.  Objective: Vital signs in last 24 hours: Temp:  [97.5 F (36.4 C)-97.8 F (36.6 C)] 97.8 F (36.6 C) (03/17 0609) Pulse Rate:  [54-63] 58 (03/17 0609) Resp:  [18-21] 18 (03/17 0609) BP: (117-147)/(70-83) 125/70 mmHg (03/17 0609) SpO2:  [94 %-98 %] 98 % (03/17 0609) Weight:  [179 lb 7.3 oz (81.4 kg)-181 lb 7 oz (82.3 kg)] 179 lb 7.3 oz (81.4 kg) (03/17 0609) Last BM Date: 10/03/15 General: alert and oriented times 3 Heart: regular rate and rythm Abdomen: soft, non-tender, non-distended, normal bowel sounds   Lab Results:  Recent Labs  10/03/15 0757 10/03/15 1830 10/04/15 0426  WBC 8.0 6.3 5.6  HGB 13.7 12.4* 11.8*  PLT 168 153 141*  MCV 96.1 91.0 92.6    Recent Labs  10/03/15 0757 10/03/15 1830 10/04/15 0426  NA 144 141 144  K 4.0 4.2 3.7  CL 111 110 109  CO2 '23 24 26  '$ GLUCOSE 125* 121* 95  BUN 26* 23* 20  CREATININE 1.32* 1.07 1.25*  CALCIUM 9.0 8.9 8.7*    Recent Labs  10/03/15 0757 10/03/15 1830  PROT 6.0* 5.8*  ALBUMIN 3.9 3.7  AST 13* 11*  ALT 14* 12*  ALKPHOS 51 47  BILITOT 1.0 1.1    Recent Labs  10/03/15 0757  INR 2.77*     Studies/Results: Ct Abdomen Pelvis Wo Contrast  10/03/2015  CLINICAL DATA:  Aneurysmal dilatation of abdominal aorta EXAM: CT ABDOMEN AND PELVIS WITHOUT CONTRAST TECHNIQUE: Multidetector CT imaging of the abdomen and pelvis was performed following the standard protocol without IV contrast. Sagittal and coronal MPR images reconstructed from axial data set. Patient drank dilute oral contrast for exam COMPARISON:  02/26/2012 FINDINGS: Bibasilar pulmonary fibrosis. Within limits of a nonenhanced exam no focal abnormalities of the liver, spleen, pancreas, or adrenals. BILATERAL renal cortical atrophy without gross mass or  hydronephrosis. Scattered atherosclerotic calcifications including coronary arteries. Aneurysmal dilatation mid abdominal aorta 3.6 x 3.7 cm image 42 previously 3.5 x 3.2 cm. Common iliac arteries mildly dilated as well, 14 mm RIGHT and 15 mm LEFT. Unremarkable bladder and ureters. Mild prostatic enlargement gland 4.5 x 3.8 cm image 87. Contracted gallbladder. Post appendectomy. Descending and sigmoid diverticulosis without definite wall thickening or pericolic inflammatory changes. Slight hyperemia of the sigmoid mesocolon is noted however. Stomach and bowel loops otherwise normal appearance. No mass, adenopathy, free air or free fluid. Bones demineralized. IMPRESSION: Renal atrophy. Basilar pulmonary fibrosis. Distal colonic diverticulosis without definite evidence of acute diverticulitis the minimal hyperemia of the sigmoid mesocolon is noted, nonspecific. Aneurysmal dilatation mid abdominal aorta 3.6 x 3.7 cm slightly increased since 2013. Prostatic enlargement. Electronically Signed   By: Lavonia Dana M.D.   On: 10/03/2015 10:23     Medications: Scheduled Meds: . amLODipine  10 mg Oral Daily  . atenolol  12.5 mg Oral BID  . finasteride  5 mg Oral Daily  . gabapentin  100 mg Oral QHS  . pantoprazole (PROTONIX) IV  40 mg Intravenous Q12H  . predniSONE  10 mg Oral Q breakfast  . sodium chloride flush  3 mL Intravenous Q12H  . tamsulosin  0.4 mg Oral QPC supper  . temazepam  30 mg Oral QHS   Continuous Infusions: . sodium chloride 75 mL/hr at 10/04/15 0414  PRN Meds:.acetaminophen, ondansetron **OR** ondansetron (ZOFRAN) IV    Assessment/Plan: 82 y.o. male with lower GI bleeding, presumed diverticular  The bleeding seems to have stopped.  Hopefully it will not restart.  Should continue to hold his coumadin.  Will advance diet today, decrease IVFluids.  Will order cbc, INR for AM.  If no rebleeding over next 24-36 hours he will likely be safe for d/c.  Will stop twice daily IV PPI and  start his usual once daily PPI, this is unlikely an UGI bleed.   Milus Banister, MD  10/04/2015, 7:54 AM [facility] Pager [phone]

## 2015-10-04 NOTE — Care Management Obs Status (Signed)
[facility] NOTIFICATION   Patient Details  Name: Ricardo Perkins MRN: [id number] Date of Birth: Dec 30, 1922   Medicare Observation Status Notification Given:  Yes    Purcell Mouton, RN 10/04/2015, 4:15 PM

## 2015-10-04 NOTE — Progress Notes (Signed)
Patient ID: Ricardo Perkins, male   DOB: October 20, 1922, 82 y.o.   MRN: [id number] [facility] PROGRESS NOTE  EISA NECAISE SRP:[id number] DOB: Sep 18, 1922 DOA: 10/03/2015 PCP: [name], MD  Brief narrative:    83 year old male with past medical history significant for paroxysmal atrial fibrillation on anticoagulation with Coumadin, hypertension, Raynaud 's phenomenon, CKD stage 3 with baseline Cr 1.3. Patient presented to [facility] with concern for multiple episodes of blood per rectum with or without bowel movement. Patient reported this started over past 24 hours prior to this admission. No pain with bleeding. No abdominal pain, nausea or vomiting.   In ED, patient was hemodynamically stable. Blood work was essentially unremarkable other than creatinine of 1.3 which is around baseline values, INR 2.7. CT abdomen showed basilar pulmonary fibrosis, distal colonic diverticulosis without acute diverticulitis, aneurysmal dilation slightly increased since 2013, prostatic enlargement. Patient was started on Protonix IV 40 mg every 12 hours. GI has seen the patient in consultation.  Assessment/Plan:    Principal Problem:  Acute lower GI bleeding  - Probably diverticular bleed - Likely triggered by Coumadin even though INR is at therapeutic level - Continue to hold Coumadin - Hemoglobin is 11.8 this morning - Per GI, no plans for diagnostic intervention at this time, continue to monitor daily hemoglobin - Continue Protonix 40 mg daily. - Please note patient was on Protonix 40 mg IV every 12 hours on the admission flow today.  Active Problems:  Essential hypertension - Continue atenolol and Norvasc - Lasix on hold, initially until we repeated blood work to make sure creatinine stable. It is reasonable to continue to hold Lasix since blood pressure is adequately controlled with atenolol and Norvasc alone. - Blood pressure is 125/70.   Raynaud phenomenon / Postinflammatory  pulmonary fibrosis (HCC) - Continue low-dose prednisone    Post herpetic neuralgia - Continue gabapentin   PAROXYSMAL ATRIAL FIBRILLATION - CHADS vasc score 2 - Holding Coumadin because of bleeding - Continue atenolol for heart rate control   CKD (chronic kidney disease) stage 3, GFR 30-59 ml/min - Baseline Cr 1.3 about 1 year ago - Creatinine at baseline values   BPH (benign prostatic hyperplasia) - Continue Flomax   DVT prophylaxis:  - SCD's due to risk of bleeding     Code Status: Full.  Family Communication:  plan of care discussed with the patient Disposition Plan: 3/18  IV access:  Peripheral IV  Procedures and diagnostic studies:    Ct Abdomen Pelvis Wo Contrast 10/03/2015  Renal atrophy. Basilar pulmonary fibrosis. Distal colonic diverticulosis without definite evidence of acute diverticulitis the minimal hyperemia of the sigmoid mesocolon is noted, nonspecific. Aneurysmal dilatation mid abdominal aorta 3.6 x 3.7 cm slightly increased since 2013. Prostatic enlargement.   Medical Consultants:  Gastroeneterology  Other Consultants:  None   IAnti-Infectives:   None    Leisa Lenz, MD  [facility] Pager [phone]  Time spent in minutes: 25 minutes  If 7PM-7AM, please contact night-coverage [email or web address] Password [email or web address] 10/04/2015, 7:03 AM   LOS: 1 day    HPI/Subjective: No acute overnight events. Patient reports feeling better.   Objective: Filed Vitals:   10/03/15 1517 10/03/15 1659 10/03/15 2122 10/04/15 0609  BP: 143/83 147/73 147/79 125/70  Pulse: 59 62 63 58  Temp:  97.5 F (36.4 C) 97.7 F (36.5 C) 97.8 F (36.6 C)  TempSrc:  Oral Oral Oral  Resp: '20 20 18 18  '$ Height:  '5\' 7"'$  (1.702 m)  Weight:  82.3 kg (181 lb 7 oz)  81.4 kg (179 lb 7.3 oz)  SpO2: 96% 96% 94% 98%    Intake/Output Summary (Last 24 hours) at 10/04/15 0703 Last data filed at 10/04/15 0156  Gross per 24 hour  Intake 1316.25 ml  Output   1350 ml  Net -33.75  ml    Exam:   General:  Pt is alert, follows commands appropriately, not in acute distress  Cardiovascular: Regular rate and rhythm, S1/S2 appreciated   Respiratory: Clear to auscultation bilaterally, no wheezing, no crackles, no rhonchi  Abdomen: Soft, non tender, non distended, bowel sounds present  Extremities: No edema, pulses palpable bilaterally  Neuro: Grossly nonfocal  Data Reviewed: Basic Metabolic Panel:  Recent Labs Lab 10/03/15 0757 10/03/15 1830 10/04/15 0426  NA 144 141 144  K 4.0 4.2 3.7  CL 111 110 109  CO2 '23 24 26  '$ GLUCOSE 125* 121* 95  BUN 26* 23* 20  CREATININE 1.32* 1.07 1.25*  CALCIUM 9.0 8.9 8.7*  MG  --  1.9  --   PHOS 3.4  --   --    Liver Function Tests:  Recent Labs Lab 10/03/15 0757 10/03/15 1830  AST 13* 11*  ALT 14* 12*  ALKPHOS 51 47  BILITOT 1.0 1.1  PROT 6.0* 5.8*  ALBUMIN 3.9 3.7   No results for input(s): LIPASE, AMYLASE in the last 168 hours. No results for input(s): AMMONIA in the last 168 hours. CBC:  Recent Labs Lab 10/03/15 0757 10/03/15 1830 10/04/15 0426  WBC 8.0 6.3 5.6  NEUTROABS 5.9 4.3  --   HGB 13.7 12.4* 11.8*  HCT 42.2 36.5* 35.1*  MCV 96.1 91.0 92.6  PLT 168 153 141*   Cardiac Enzymes: No results for input(s): CKTOTAL, CKMB, CKMBINDEX, TROPONINI in the last 168 hours. BNP: Invalid input(s): POCBNP CBG: No results for input(s): GLUCAP in the last 168 hours.  No results found for this or any previous visit (from the past 240 hour(s)).   Scheduled Meds: . amLODipine  10 mg Oral Daily  . atenolol  12.5 mg Oral BID  . finasteride  5 mg Oral Daily  . gabapentin  100 mg Oral QHS  . pantoprazole (PROTONIX) IV  40 mg Intravenous Q12H  . predniSONE  10 mg Oral Q breakfast  . sodium chloride flush  3 mL Intravenous Q12H  . tamsulosin  0.4 mg Oral QPC supper  . temazepam  30 mg Oral QHS   Continuous Infusions: . sodium chloride 75 mL/hr at 10/04/15 0414

## 2015-10-05 DIAGNOSIS — I10 Essential (primary) hypertension: Secondary | ICD-10-CM | POA: Diagnosis not present

## 2015-10-05 DIAGNOSIS — B0229 Other postherpetic nervous system involvement: Secondary | ICD-10-CM | POA: Diagnosis not present

## 2015-10-05 DIAGNOSIS — I480 Paroxysmal atrial fibrillation: Secondary | ICD-10-CM | POA: Diagnosis not present

## 2015-10-05 DIAGNOSIS — K922 Gastrointestinal hemorrhage, unspecified: Secondary | ICD-10-CM | POA: Diagnosis not present

## 2015-10-05 LAB — PROTIME-INR
INR: 2.22 — ABNORMAL HIGH (ref 0.00–1.49)
PROTHROMBIN TIME: 24.4 s — AB (ref 11.6–15.2)

## 2015-10-05 LAB — CBC
HCT: 34.4 % — ABNORMAL LOW (ref 39.0–52.0)
HEMOGLOBIN: 11.4 g/dL — AB (ref 13.0–17.0)
MCH: 30.8 pg (ref 26.0–34.0)
MCHC: 33.1 g/dL (ref 30.0–36.0)
MCV: 93 fL (ref 78.0–100.0)
PLATELETS: 151 10*3/uL (ref 150–400)
RBC: 3.7 MIL/uL — ABNORMAL LOW (ref 4.22–5.81)
RDW: 14.7 % (ref 11.5–15.5)
WBC: 5.3 10*3/uL (ref 4.0–10.5)

## 2015-10-05 LAB — GLUCOSE, CAPILLARY: Glucose-Capillary: 110 mg/dL — ABNORMAL HIGH (ref 65–99)

## 2015-10-05 NOTE — Progress Notes (Signed)
PT Cancellation Note  Patient Details Name: Ricardo Perkins MRN: [id number] DOB: 1923/03/17   Cancelled Treatment:    Reason Eval/Treat Not Completed: PT screened, no needs identified, will sign off  Spoke with RN who reports pt does not need to be seen by PT-already discharged. Will sign off. Thanks.    Weston Anna, MPT Pager: [phone]

## 2015-10-05 NOTE — Discharge Instructions (Signed)

## 2015-10-05 NOTE — Progress Notes (Signed)
[facility] Progress Note    Since last GI note: Feels fine.  Eating solid food without nausea, vomiting or abd pains.  BM yesterday was non-bloody per RN.  He is eager to go home.  Objective: Vital signs in last 24 hours: Temp:  [97.5 F (36.4 C)-98.3 F (36.8 C)] 97.5 F (36.4 C) (03/17 2138) Pulse Rate:  [56-65] 65 (03/17 2138) Resp:  [18-20] 18 (03/17 2138) BP: (100-127)/(63-69) 127/69 mmHg (03/17 2138) SpO2:  [94 %-96 %] 96 % (03/17 2138) Last BM Date: 10/04/15 General: alert and oriented times 3 Heart: regular rate and rythm Abdomen: soft, non-tender, non-distended, normal bowel sounds   Lab Results:  Recent Labs  10/03/15 1830 10/04/15 0426 10/05/15 0533  WBC 6.3 5.6 5.3  HGB 12.4* 11.8* 11.4*  PLT 153 141* 151  MCV 91.0 92.6 93.0    Recent Labs  10/03/15 0757 10/03/15 1830 10/04/15 0426  NA 144 141 144  K 4.0 4.2 3.7  CL 111 110 109  CO2 '23 24 26  '$ GLUCOSE 125* 121* 95  BUN 26* 23* 20  CREATININE 1.32* 1.07 1.25*  CALCIUM 9.0 8.9 8.7*    Recent Labs  10/03/15 0757 10/03/15 1830  PROT 6.0* 5.8*  ALBUMIN 3.9 3.7  AST 13* 11*  ALT 14* 12*  ALKPHOS 51 47  BILITOT 1.0 1.1    Recent Labs  10/03/15 0757 10/05/15 0533  INR 2.77* 2.22*    Medications: Scheduled Meds: . amLODipine  10 mg Oral Daily  . atenolol  12.5 mg Oral BID  . finasteride  5 mg Oral Daily  . gabapentin  100 mg Oral QHS  . pantoprazole  40 mg Oral Q0600  . predniSONE  10 mg Oral Q breakfast  . sodium chloride flush  3 mL Intravenous Q12H  . tamsulosin  0.4 mg Oral QPC supper  . temazepam  30 mg Oral QHS   Continuous Infusions: . sodium chloride 10 mL/hr at 10/04/15 1017   PRN Meds:.acetaminophen, ondansetron **OR** ondansetron (ZOFRAN) IV    Assessment/Plan: 82 y.o. male with lower GI bleeding, presumed diverticular.    Bleeding has clearly stopped.  He has not bled enough to require blood transfusion and this is despite INR remaining elevated  after holding for past 2-3 days; Hb this AM 11.4.  He has been very clear that he is NOT interested in colonoscopy to make sure he does not have advanced polyp or cancer; his last colonoscopy was 10-20 years ago. He is 82 years old and I can understand that decision.  He is safe for d/c later today and he should follow up with GI on as needed basis.  He should remain off coumadin for another 5 days.    Milus Banister, MD  10/05/2015, 7:26 AM [facility] Pager [phone]

## 2015-10-05 NOTE — Discharge Summary (Signed)
Physician Discharge Summary  Ricardo Perkins YJE:[id number] DOB: 1923/01/20 DOA: 10/03/2015  PCP: Ricardo Koch, MD  Admit date: 10/03/2015 Discharge date: 10/05/2015  Recommendations for Outpatient Follow-up:  Hold Coumadin for 5 days on discharge. Once you resume it, resume home dose. Please monitor for any signs of bleeding with rates gross bleeding with evidence of bright red blood per rectum, black tarry stool or vomiting with blood.   Also, hold Lasix until you're seen by primary care physician and when they check your kidney function is kidney function is within normal limits than May resume Lasix per home dose.  Discharge Diagnoses:  Principal Problem:   Acute lower GI bleeding Active Problems:   Essential hypertension   Raynaud phenomenon   Postinflammatory pulmonary fibrosis (HCC)   Post herpetic neuralgia   PAROXYSMAL ATRIAL FIBRILLATION   CKD (chronic kidney disease) stage 3, GFR 30-59 ml/min   BPH (benign prostatic hyperplasia)    Discharge Condition: stable   Diet recommendation: as tolerated   History of present illness:  82 year old male with past medical history significant for paroxysmal atrial fibrillation on anticoagulation with Coumadin, hypertension, Raynaud 's phenomenon, CKD stage 3 with baseline Cr 1.3. Patient presented to [facility] with concern for multiple episodes of blood per rectum with or without bowel movement. Patient reported this started over past 24 hours prior to this admission. No pain with bleeding. No abdominal pain, nausea or vomiting.   In ED, patient was hemodynamically stable. Blood work was essentially unremarkable other than creatinine of 1.3 which is around baseline values, INR 2.7. CT abdomen showed basilar pulmonary fibrosis, distal colonic diverticulosis without acute diverticulitis, aneurysmal dilation slightly increased since 2013, prostatic enlargement. Patient was started on Protonix IV 40 mg every 12 hours. GI has  seen the patient in consultation.  Hospital Course:    Assessment/Plan:    Principal Problem:  Acute lower GI bleeding  - Probably diverticular bleed - Likely triggered by Coumadin even though INR at therapeutic level - Per GI and TRH it was necessary to continue to monitor patient through today to make sure hemoglobin remains stable and that he does not believe further - This morning, hemoglobin is 11.4 - Patient is stable for discharge. Per GI hold Coumadin for next 5 days on discharge. - Continue PPI therapy on discharge. - Please note patient was on Protonix 40 mg IV every 12 hours on the admission flow today.  Active Problems:  Essential hypertension - Continue atenolol and Norvasc - Lasix on hold, recommended to repeat creatinine on outpatient basis and if within normal limits and patient can resume Lasix on as needed basis per home dose   Raynaud phenomenon / Postinflammatory pulmonary fibrosis (HCC) - Continue low-dose prednisone    Post herpetic neuralgia - Continue gabapentin   PAROXYSMAL ATRIAL FIBRILLATION - CHADS vasc score 2 - Holding Coumadin because of bleeding. As recommended above, hold for 5 days on discharge per GI recommendations. - Continue atenolol for heart rate control   CKD (chronic kidney disease) stage 3, GFR 30-59 ml/min - Baseline Cr 1.3 about 1 year ago - Creatinine at baseline values   BPH (benign prostatic hyperplasia) - Continue Flomax   DVT prophylaxis:  - SCD's due to risk of bleeding    Code Status: Full.  Family Communication: plan of care discussed with the patient and his daughter at the bedside   IV access:  Peripheral IV  Procedures and diagnostic studies:   Ct Abdomen Pelvis Wo Contrast 10/03/2015  Renal atrophy. Basilar pulmonary fibrosis. Distal colonic diverticulosis without definite evidence of acute diverticulitis the minimal hyperemia of the sigmoid mesocolon is noted, nonspecific. Aneurysmal  dilatation mid abdominal aorta 3.6 x 3.7 cm slightly increased since 2013. Prostatic enlargement.   Medical Consultants:  Gastroeneterology  Other Consultants:  None   IAnti-Infectives:   None    Signed:  Leisa Lenz, MD  [facility] 10/05/2015, 9:46 AM  Pager #: [phone]  Time spent in minutes: Less than 30 minutes   Discharge Exam: Filed Vitals:   10/04/15 2138 10/05/15 0909  BP: 127/69 120/66  Pulse: 65 58  Temp: 97.5 F (36.4 C)   Resp: 18    Filed Vitals:   10/04/15 1017 10/04/15 1343 10/04/15 2138 10/05/15 0909  BP: 100/63 125/64 127/69 120/66  Pulse: 56 59 65 58  Temp:  98.3 F (36.8 C) 97.5 F (36.4 C)   TempSrc:  Oral Oral   Resp:  20 18   Height:      Weight:      SpO2:  94% 96%     General: Pt is alert, follows commands appropriately, not in acute distress Cardiovascular: Rate controlled, S1/S2 + Respiratory: Clear to auscultation bilaterally, no wheezing, no crackles, no rhonchi Abdominal: Soft, non tender, non distended, bowel sounds +, no guarding Extremities: no edema, no cyanosis, pulses palpable bilaterally DP and PT Neuro: Grossly nonfocal  Discharge Instructions  Discharge Instructions    Call MD for:  difficulty breathing, headache or visual disturbances    Complete by:  As directed      Call MD for:  persistant dizziness or light-headedness    Complete by:  As directed      Call MD for:  persistant nausea and vomiting    Complete by:  As directed      Call MD for:  severe uncontrolled pain    Complete by:  As directed      Diet - low sodium heart healthy    Complete by:  As directed      Discharge instructions    Complete by:  As directed   Hold Coumadin for 5 days on discharge. Once you resume it, resume home dose. Please monitor for any signs of bleeding with rates gross bleeding with evidence of bright red blood per rectum, black tarry stool or vomiting with blood.   Also, hold Lasix until you're seen by  primary care physician and when they check your kidney function is kidney function is within normal limits than May resume Lasix per home dose.     Increase activity slowly    Complete by:  As directed             Medication List    STOP taking these medications        furosemide 20 MG tablet  Commonly known as:  LASIX     warfarin 3 MG tablet  Commonly known as:  COUMADIN      TAKE these medications        acetaminophen 500 MG tablet  Commonly known as:  TYLENOL  Take 500 mg by mouth every 6 (six) hours as needed for moderate pain or headache.     amLODipine 10 MG tablet  Commonly known as:  NORVASC  Take 1 tablet (10 mg total) by mouth daily.     atenolol 25 MG tablet  Commonly known as:  TENORMIN  TAKE 1/2 TABLET BY MOUTH TWICE A DAY     finasteride 5 MG  tablet  Commonly known as:  PROSCAR  Take 1 tablet (5 mg total) by mouth daily.     gabapentin 100 MG capsule  Commonly known as:  NEURONTIN  Take 1 capsule (100 mg total) by mouth at bedtime. Can take 2 capsules at night after first 7 days     lidocaine 5 %  Commonly known as:  LIDODERM  Place 1 patch onto the skin daily. Remove & Discard patch within 12 hours or as directed by MD     omeprazole 20 MG capsule  Commonly known as:  PRILOSEC  TAKE ONE CAPSULE BY MOUTH EVERY DAY AS NEEDED     predniSONE 10 MG tablet  Commonly known as:  DELTASONE  TAKE 1 TABLET BY MOUTH EVERY DAY WITH BREAKFAST     tamsulosin 0.4 MG Caps capsule  Commonly known as:  FLOMAX  Take 1 capsule (0.4 mg total) by mouth daily after supper. --- Pt needs appt for further refills     temazepam 30 MG capsule  Commonly known as:  RESTORIL  TAKE 1 CAPSULE BY MOUTH EVERY NIGHT AT BEDTIME     traMADol 50 MG tablet  Commonly known as:  ULTRAM  Take 1 tablet (50 mg total) by mouth every 8 (eight) hours as needed.           Follow-up Information    Follow up with Ricardo Koch, MD. Schedule an appointment as soon as possible  for a visit in 2 weeks.   Specialty:  Internal Medicine   Why:  Follow up appt after recent hospitalization   Contact information:   [place] [id number] [phone]        The results of significant diagnostics from this hospitalization (including imaging, microbiology, ancillary and laboratory) are listed below for reference.    Significant Diagnostic Studies: Ct Abdomen Pelvis Wo Contrast  10/03/2015  CLINICAL DATA:  Aneurysmal dilatation of abdominal aorta EXAM: CT ABDOMEN AND PELVIS WITHOUT CONTRAST TECHNIQUE: Multidetector CT imaging of the abdomen and pelvis was performed following the standard protocol without IV contrast. Sagittal and coronal MPR images reconstructed from axial data set. Patient drank dilute oral contrast for exam COMPARISON:  02/26/2012 FINDINGS: Bibasilar pulmonary fibrosis. Within limits of a nonenhanced exam no focal abnormalities of the liver, spleen, pancreas, or adrenals. BILATERAL renal cortical atrophy without gross mass or hydronephrosis. Scattered atherosclerotic calcifications including coronary arteries. Aneurysmal dilatation mid abdominal aorta 3.6 x 3.7 cm image 42 previously 3.5 x 3.2 cm. Common iliac arteries mildly dilated as well, 14 mm RIGHT and 15 mm LEFT. Unremarkable bladder and ureters. Mild prostatic enlargement gland 4.5 x 3.8 cm image 87. Contracted gallbladder. Post appendectomy. Descending and sigmoid diverticulosis without definite wall thickening or pericolic inflammatory changes. Slight hyperemia of the sigmoid mesocolon is noted however. Stomach and bowel loops otherwise normal appearance. No mass, adenopathy, free air or free fluid. Bones demineralized. IMPRESSION: Renal atrophy. Basilar pulmonary fibrosis. Distal colonic diverticulosis without definite evidence of acute diverticulitis the minimal hyperemia of the sigmoid mesocolon is noted, nonspecific. Aneurysmal dilatation mid abdominal aorta 3.6 x 3.7 cm slightly  increased since 2013. Prostatic enlargement. Electronically Signed   By: Lavonia Dana M.D.   On: 10/03/2015 10:23    Microbiology: No results found for this or any previous visit (from the past 240 hour(s)).   Labs: Basic Metabolic Panel:  Recent Labs Lab 10/03/15 0757 10/03/15 1830 10/04/15 0426  NA 144 141 144  K 4.0 4.2 3.7  CL 111 110 109  CO2 '23 24 26  '$ GLUCOSE 125* 121* 95  BUN 26* 23* 20  CREATININE 1.32* 1.07 1.25*  CALCIUM 9.0 8.9 8.7*  MG  --  1.9  --   PHOS 3.4  --   --    Liver Function Tests:  Recent Labs Lab 10/03/15 0757 10/03/15 1830  AST 13* 11*  ALT 14* 12*  ALKPHOS 51 47  BILITOT 1.0 1.1  PROT 6.0* 5.8*  ALBUMIN 3.9 3.7   No results for input(s): LIPASE, AMYLASE in the last 168 hours. No results for input(s): AMMONIA in the last 168 hours. CBC:  Recent Labs Lab 10/03/15 0757 10/03/15 1830 10/04/15 0426 10/05/15 0533  WBC 8.0 6.3 5.6 5.3  NEUTROABS 5.9 4.3  --   --   HGB 13.7 12.4* 11.8* 11.4*  HCT 42.2 36.5* 35.1* 34.4*  MCV 96.1 91.0 92.6 93.0  PLT 168 153 141* 151   Cardiac Enzymes: No results for input(s): CKTOTAL, CKMB, CKMBINDEX, TROPONINI in the last 168 hours. BNP: BNP (last 3 results) No results for input(s): BNP in the last 8760 hours.  ProBNP (last 3 results) No results for input(s): PROBNP in the last 8760 hours.  CBG:  Recent Labs Lab 10/05/15 0751  GLUCAP 110*

## 2015-10-05 NOTE — Progress Notes (Signed)
Patient discharged @ 1253 via wheelchair in stable condition.  Educated pts daughter Jocelyn Lamer) regarding discharge instructions, medication regimen, and signs of GI bleeding.  Reinforced that pt should not resume Coumadin for 5 days and should continue to avoid NSAIDS and aspirin. Teach back completed.

## 2015-10-08 ENCOUNTER — Telehealth: Payer: Self-pay | Admitting: *Deleted

## 2015-10-08 ENCOUNTER — Telehealth: Payer: Self-pay | Admitting: Internal Medicine

## 2015-10-08 NOTE — Telephone Encounter (Signed)
Other than being in the hospital I do not see that he has seen anyone but me since last visit. I already have full schedule and would be doing him a dis-service by fitting him in because I would not be able to spend the required time with him without having to take away time from someone else who may need it. I would recommend that they call and schedule a hospital follow up with someone else at our office if they want to be seen Friday.

## 2015-10-08 NOTE — Telephone Encounter (Signed)
Patient was released  from the hospital on Sunday for an issue he has been seen here for.  Daughter is concerned about fathers health because he has seen several other providers instead of his primary.  Daughter is requesting that Dr. Sharlet Salina works him in for hospital fu.  States that Dr. Ardis Hughs wants PCP to handle this issue of diarrhea with rectal bleeding.  States patient is having pain on his side and states this is not shingles per hospital.  Daughter states patient has coum check on Friday and would like father to be seen that same day for his hospital follow up.

## 2015-10-08 NOTE — Telephone Encounter (Signed)
Scheduled patient for hospital fu with Marya Amsler.  Left daughter vm.

## 2015-10-08 NOTE — Telephone Encounter (Signed)
Transition Care Management Follow-up Telephone Call   Date discharged? 10/05/15   How have you been since you were released from the hospital? Spoke w/daughter Ricardo Perkins) she states dad is doing alright   Do you understand why you were in the hospital? YES   Do you understand the discharge instructions? YES   Where were you discharged to? Home   Items Reviewed:  Medications reviewed: YES  Allergies reviewed: YES  Dietary changes reviewed: YES  Referrals reviewed: No referral needed   Functional Questionnaire:   Activities of Daily Living (ADLs):   She states they are independent in the following: feeding, continence, grooming, toileting and dressing States he require assistance with the following: ambulation and bathing and hygiene sometimes   Any transportation issues/concerns?: NO   Any patient concerns? NO   Confirmed importance and date/time of follow-up visits scheduled YES, appt 10/11/15  Provider Appointment booked with Dr. Sharlet Perkins  Confirmed with patient if condition begins to worsen call PCP or go to the ER.  Patient was given the office number and encouraged to call back with question or concerns.  : YES

## 2015-10-11 ENCOUNTER — Encounter: Payer: Self-pay | Admitting: Family

## 2015-10-11 ENCOUNTER — Ambulatory Visit (INDEPENDENT_AMBULATORY_CARE_PROVIDER_SITE_OTHER): Payer: Medicare Other | Admitting: Family

## 2015-10-11 ENCOUNTER — Ambulatory Visit (INDEPENDENT_AMBULATORY_CARE_PROVIDER_SITE_OTHER): Payer: Medicare Other | Admitting: General Practice

## 2015-10-11 VITALS — BP 122/76 | HR 57 | Temp 97.6°F | Resp 16 | Ht 67.0 in | Wt 184.0 lb

## 2015-10-11 DIAGNOSIS — I4891 Unspecified atrial fibrillation: Secondary | ICD-10-CM | POA: Diagnosis not present

## 2015-10-11 DIAGNOSIS — K922 Gastrointestinal hemorrhage, unspecified: Secondary | ICD-10-CM | POA: Diagnosis not present

## 2015-10-11 LAB — POCT INR: INR: 1

## 2015-10-11 NOTE — Progress Notes (Signed)
Pre visit review using our clinic review tool, if applicable. No additional management support is needed unless otherwise documented below in the visit note. 

## 2015-10-11 NOTE — Progress Notes (Signed)
Pre visit review using our clinic review tool, if applicable. No additional management support is needed unless otherwise documented below in the visit note. INR is low today.  Patient has been in the hospital recently for rectal bleeding and has been off coumadin for 5 days.  Instructed patient to resume coumadin as directed and to have INR checked in 1 week.  Also encouraged patient to report any rectal bleeding to PCP.

## 2015-10-11 NOTE — Patient Instructions (Addendum)
Thank you for choosing [facility].  Summary/Instructions:  Please continue to take your medications as prescribed.   Follow up for your coumadin visit.   Please stop by the lab on the basement level of the building for your blood work. Your results will be released to [place] (or called to you) after review, usually within 72 hours after test completion. If any changes need to be made, you will be notified at that same time.  If your symptoms worsen or fail to improve, please contact our office for further instruction, or in case of emergency go directly to the emergency room at the closest medical facility.

## 2015-10-11 NOTE — Progress Notes (Signed)
Subjective:    Patient ID: Ricardo Perkins, male    DOB: 09/08/1922, 82 y.o.   MRN: [id number]  Chief Complaint  Patient presents with  . Hospitalization Follow-up    was seen for rectal bleeding, not having any issues with it now, needs coumadin check    HPI:  Ricardo Perkins is a 82 y.o. male who  has a past medical history of INSOMNIA, PERSISTENT; DECREASED HEARING; HYPERTENSION; BRADYCARDIA; RENAL INSUFFICIENCY; BENIGN PROSTATIC HYPERTROPHY, WITH OBSTRUCTION; ASTEATOTIC ECZEMA; LEG EDEMA, BILATERAL; PAROXYSMAL ATRIAL FIBRILLATION; Arthritis; COPD (chronic obstructive pulmonary disease) ([place]); Advanced age; echocardiogram; cardiovascular stress test; Large bilateral inguinal hernias - chronic, containing small bowel (right) and sigmoid colon (left) (01/15/2011); OA (osteoarthritis) of knee; and RHINITIS, VASOMOTOR (01/07/2010). and presents today for a hospital follow up.   This is a new problem. Recently evaluated in the emergency department and admitted to the hospital with concern for rectal bleeding. For approximately one day had multiple episodes of blood per rectum with minimal stool production. No evidence of lightheadedness, syncope, chest pain, or dyspnea. He did have ongoing right-sided abdominal pain. Physical exam with atrial fibrillation and positive guaiac stool test noted. Prothrombin time and INR were elevated secondary to anticoagulation for history of atrial fibrillation. CT scan with possible diverticulosis as a cause of his bleeding. He was admitted for further evaluation and management. Diagnosed with acute lower GI bleed most likely related to diverticulosis and triggered by Coumadin. Upon discharge his hemoglobin was 11.4 and appeared stable. Gastroenterology recommended holding Coumadin for the next 5 days and continuing proton pump inhibitor. Lasix was held secondary to kidney function with recommended repeat creatinine on outpatient basis. All hospital records, labs, and  images reviewed in detail.  Since leaving the hospital he has been doing fine with no bleeding, vomiting, diarrhea, or abdominal pain. Does have a small pinching side pain. Has not yet to restart his coumadin and has not taken Lasix which he states he uses 1-2 times per year. Able to functionally complete his activities of daily living. Continues to take the pantroprazole as prescribed.    No Known Allergies   Current Outpatient Prescriptions on File Prior to Visit  Medication Sig Dispense Refill  . amLODipine (NORVASC) 10 MG tablet Take 1 tablet (10 mg total) by mouth daily. 90 tablet 1  . atenolol (TENORMIN) 25 MG tablet TAKE 1/2 TABLET BY MOUTH TWICE A DAY 90 tablet 3  . gabapentin (NEURONTIN) 100 MG capsule Take 1 capsule (100 mg total) by mouth at bedtime. Can take 2 capsules at night after first 7 days 60 capsule 0  . lidocaine (LIDODERM) 5 % Place 1 patch onto the skin daily. Remove & Discard patch within 12 hours or as directed by MD 60 patch 6  . omeprazole (PRILOSEC) 20 MG capsule TAKE ONE CAPSULE BY MOUTH EVERY DAY AS NEEDED (Patient taking differently: TAKE ONE CAPSULE BY MOUTH EVERY DAY AS NEEDED FOR HEARTBURN OR INDIGESTION) 90 capsule 1  . predniSONE (DELTASONE) 10 MG tablet TAKE 1 TABLET BY MOUTH EVERY DAY WITH BREAKFAST 30 tablet 2  . tamsulosin (FLOMAX) 0.4 MG CAPS capsule Take 1 capsule (0.4 mg total) by mouth daily after supper. --- Pt needs appt for further refills 90 capsule 0  . temazepam (RESTORIL) 30 MG capsule TAKE 1 CAPSULE BY MOUTH EVERY NIGHT AT BEDTIME 90 capsule 0   No current facility-administered medications on file prior to visit.     Past Surgical History  Procedure Laterality Date  .  Appendectomy      @ 23  . Tonsillectomy    . Hernia repair  01/2011    bilateral inguinal hernia   . Eye surgery      bilateral cataract surgery   . Other surgical history      left finger surgery   . Knee surgery  right  . Total knee arthroplasty  10/26/2011     Procedure: TOTAL KNEE ARTHROPLASTY;  Surgeon: Gearlean Alf, MD;  Location: [facility];  Service: Orthopedics;  Laterality: Right;    Past Medical History  Diagnosis Date  . INSOMNIA, PERSISTENT   . DECREASED HEARING   . HYPERTENSION   . BRADYCARDIA     1st degree AVB  . RENAL INSUFFICIENCY   . BENIGN PROSTATIC HYPERTROPHY, WITH OBSTRUCTION   . ASTEATOTIC ECZEMA   . LEG EDEMA, BILATERAL   . PAROXYSMAL ATRIAL FIBRILLATION     coumadin rx  . Arthritis     knees, shoulders   . COPD (chronic obstructive pulmonary disease) ([facility])     on chronic oxygen therapy qhs>daytime  . Advanced age   . Hx of echocardiogram     a. echo in 2009 (in [place]): normal EF, mild LVH;   b. echo 10/13:  mid Ant HK, mod LVH, EF 55%, trivial AI, mod LAE, mild RVE, mild reduced RVSF, PASP 46  . Hx of cardiovascular stress test     a.  MV 4/09:  possible small area of lateral ischemia at the apex, but Dr. Kirk Ruths reviewed this, and in fact, felt it was most likely normal.  b.  Myoview 3/13 was again low risk with an EF of 62% with small reversible defect in the apex suggestive of very mild apical ischemia.  . Large bilateral inguinal hernias - chronic, containing small bowel (right) and sigmoid colon (left) 01/15/2011    s/p B repair  . OA (osteoarthritis) of knee     S/p right TKR March '13 (Dr. Wynelle Link)   . RHINITIS, VASOMOTOR 01/07/2010     Review of Systems  Constitutional: Negative for fever, chills and diaphoresis.  Gastrointestinal: Negative for nausea, vomiting, abdominal pain, diarrhea, constipation, blood in stool and anal bleeding.  Neurological: Negative for dizziness and light-headedness.      Objective:    BP 122/76 mmHg  Pulse 57  Temp(Src) 97.6 F (36.4 C) (Oral)  Resp 16  Ht '5\' 7"'$  (1.702 m)  Wt 184 lb (83.462 kg)  BMI 28.81 kg/m2  SpO2 91% Nursing note and vital signs reviewed.  Physical Exam  Constitutional: He is oriented to person, place, and time. He appears  well-developed and well-nourished. No distress.  Cardiovascular: Normal rate, regular rhythm, normal heart sounds and intact distal pulses.   Pulmonary/Chest: Effort normal and breath sounds normal.  Abdominal: Soft. Bowel sounds are normal. He exhibits no distension and no mass. There is no tenderness. There is no rebound and no guarding.  Neurological: He is alert and oriented to person, place, and time.  Skin: Skin is warm and dry.  Right side with healing discoloration in a dermatomal pattern consistent with previous shingles.   Psychiatric: He has a normal mood and affect. His behavior is normal. Judgment and thought content normal.       Assessment & Plan:   Problem List Items Addressed This Visit      Digestive   Acute lower GI bleeding - Primary    Symptoms of GI bleeding appear resolved with no further symptoms or weakness.  Eating and drinking without problems and has had bowel movements with no blood. Ok to restart coumadin and follow up for INR. Obtain BMET to recheck kidney function. Continue other medications as prescribed. Follow up if symptoms return.       Relevant Orders   Basic Metabolic Panel (BMET)

## 2015-10-11 NOTE — Assessment & Plan Note (Signed)
Symptoms of GI bleeding appear resolved with no further symptoms or weakness. Eating and drinking without problems and has had bowel movements with no blood. Ok to restart coumadin and follow up for INR. Obtain BMET to recheck kidney function. Continue other medications as prescribed. Follow up if symptoms return.

## 2015-10-11 NOTE — Progress Notes (Signed)
I have reviewed and agree with the plan. 

## 2015-10-12 ENCOUNTER — Other Ambulatory Visit: Payer: Self-pay | Admitting: Nurse Practitioner

## 2015-10-14 ENCOUNTER — Encounter: Payer: Self-pay | Admitting: Family

## 2015-10-14 ENCOUNTER — Other Ambulatory Visit (INDEPENDENT_AMBULATORY_CARE_PROVIDER_SITE_OTHER): Payer: Medicare Other

## 2015-10-14 DIAGNOSIS — K922 Gastrointestinal hemorrhage, unspecified: Secondary | ICD-10-CM | POA: Diagnosis not present

## 2015-10-14 LAB — BASIC METABOLIC PANEL
BUN: 28 mg/dL — AB (ref 6–23)
CALCIUM: 9.2 mg/dL (ref 8.4–10.5)
CO2: 27 mEq/L (ref 19–32)
CREATININE: 1.43 mg/dL (ref 0.40–1.50)
Chloride: 109 mEq/L (ref 96–112)
GFR: 49.26 mL/min — AB (ref >60.00)
GLUCOSE: 112 mg/dL — AB (ref 70–99)
POTASSIUM: 3.9 meq/L (ref 3.5–5.1)
SODIUM: 145 meq/L (ref 135–145)

## 2015-10-14 NOTE — Telephone Encounter (Signed)
Last seen 09/15/14 was suppose to follow up in 2 weeks. He did not come in. Please advise

## 2015-10-18 ENCOUNTER — Ambulatory Visit (INDEPENDENT_AMBULATORY_CARE_PROVIDER_SITE_OTHER): Payer: Medicare Other | Admitting: General Practice

## 2015-10-18 DIAGNOSIS — I4891 Unspecified atrial fibrillation: Secondary | ICD-10-CM | POA: Diagnosis not present

## 2015-10-18 LAB — POCT INR: INR: 1.4

## 2015-10-18 NOTE — Progress Notes (Signed)
Pre visit review using our clinic review tool, if applicable. No additional management support is needed unless otherwise documented below in the visit note. 

## 2015-10-18 NOTE — Progress Notes (Signed)
I have reviewed and agree with the plan. 

## 2015-11-02 NOTE — Progress Notes (Signed)
HPI:FU atrial fibrillation. Myoview 3/13 was low risk with an EF of 62% with small reversible defect in the apex suggestive of very mild apical ischemia. Echocardiogram 1/17 showed EF 40-45, mild AI, mild LAE and mildly elevated pulmonary pressure. Chest CT 1/17 showed TAA 4.1 cm. Abd CT 3/17 showed pulmonary fibrosis, AAA 3.6 x 3.7 cm. Had GI bleed 3/17 felt likely diverticular; coumadin transiently held. Since last seen, the patient has dyspnea with more extreme activities but not with routine activities. It is relieved with rest. It is not associated with chest pain. There is no orthopnea, PND or pedal edema. There is no syncope or palpitations. There is no exertional chest pain.   Current Outpatient Prescriptions  Medication Sig Dispense Refill  . amLODipine (NORVASC) 10 MG tablet Take 1 tablet (10 mg total) by mouth daily. 90 tablet 1  . atenolol (TENORMIN) 25 MG tablet TAKE 1/2 TABLET BY MOUTH TWICE A DAY 90 tablet 3  . gabapentin (NEURONTIN) 100 MG capsule Take 1 capsule (100 mg total) by mouth at bedtime. Can take 2 capsules at night after first 7 days 60 capsule 0  . lidocaine (LIDODERM) 5 % Place 1 patch onto the skin daily. Remove & Discard patch within 12 hours or as directed by MD 60 patch 6  . omeprazole (PRILOSEC) 20 MG capsule TAKE ONE CAPSULE BY MOUTH EVERY DAY AS NEEDED (Patient taking differently: TAKE ONE CAPSULE BY MOUTH EVERY DAY AS NEEDED FOR HEARTBURN OR INDIGESTION) 90 capsule 1  . predniSONE (DELTASONE) 10 MG tablet TAKE 1 TABLET BY MOUTH EVERY DAY WITH BREAKFAST 30 tablet 2  . tamsulosin (FLOMAX) 0.4 MG CAPS capsule Take 1 capsule (0.4 mg total) by mouth daily after supper. --- Pt needs appt for further refills 90 capsule 0  . temazepam (RESTORIL) 30 MG capsule TAKE 1 CAPSULE BY MOUTH EVERY NIGHT AT BEDTIME 90 capsule 0   No current facility-administered medications for this visit.     Past Medical History  Diagnosis Date  . INSOMNIA, PERSISTENT   . DECREASED  HEARING   . HYPERTENSION   . BRADYCARDIA     1st degree AVB  . RENAL INSUFFICIENCY   . BENIGN PROSTATIC HYPERTROPHY, WITH OBSTRUCTION   . ASTEATOTIC ECZEMA   . LEG EDEMA, BILATERAL   . PAROXYSMAL ATRIAL FIBRILLATION     coumadin rx  . Arthritis     knees, shoulders   . COPD (chronic obstructive pulmonary disease) ([facility])     on chronic oxygen therapy qhs>daytime  . Advanced age   . Hx of echocardiogram     a. echo in 2009 (in [place]): normal EF, mild LVH;   b. echo 10/13:  mid Ant HK, mod LVH, EF 55%, trivial AI, mod LAE, mild RVE, mild reduced RVSF, PASP 46  . Hx of cardiovascular stress test     a.  MV 4/09:  possible small area of lateral ischemia at the apex, but Dr. Kirk Ruths reviewed this, and in fact, felt it was most likely normal.  b.  Myoview 3/13 was again low risk with an EF of 62% with small reversible defect in the apex suggestive of very mild apical ischemia.  . Large bilateral inguinal hernias - chronic, containing small bowel (right) and sigmoid colon (left) 01/15/2011    s/p B repair  . OA (osteoarthritis) of knee     S/p right TKR March '13 (Dr. Wynelle Link)   . RHINITIS, VASOMOTOR 01/07/2010    Past Surgical History  Procedure Laterality Date  . Appendectomy      @ 23  . Tonsillectomy    . Hernia repair  01/2011    bilateral inguinal hernia   . Eye surgery      bilateral cataract surgery   . Other surgical history      left finger surgery   . Knee surgery  right  . Total knee arthroplasty  10/26/2011    Procedure: TOTAL KNEE ARTHROPLASTY;  Surgeon: Gearlean Alf, MD;  Location: [facility];  Service: Orthopedics;  Laterality: Right;    Social History   Social History  . Marital Status: Single    Spouse Name: N/A  . Number of Children: N/A  . Years of Education: N/A   Occupational History  . Not on file.   Social History Main Topics  . Smoking status: Former Smoker -- 1.00 packs/day for 30 years    Types: Cigarettes    Quit date: 07/20/1998  .  Smokeless tobacco: Never Used  . Alcohol Use: 0.0 oz/week    0 Standard drinks or equivalent per week     Comment: wine occasional   . Drug Use: No  . Sexual Activity: Not Currently   Other Topics Concern  . 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 Heart disease Father   . Heart attack Father   . Cancer Mother     colon   . Cirrhosis Sister     liver failure  . Heart attack Brother   . Diabetes Neg Hx     ROS: no fevers or chills, productive cough, hemoptysis, dysphasia, odynophagia, melena, hematochezia, dysuria, hematuria, rash, seizure activity, orthopnea, PND, pedal edema, claudication. Remaining systems are negative.  Physical Exam: Well-developed well-nourished in no acute distress.  Skin is warm and dry.  HEENT is normal.  Neck is supple.  Chest is clear to auscultation with normal expansion.  Cardiovascular exam is regular rate and rhythm.  Abdominal exam nontender or distended. No masses palpated. Extremities show no edema. neuro grossly intact

## 2015-11-04 ENCOUNTER — Encounter: Payer: Self-pay | Admitting: Cardiology

## 2015-11-04 ENCOUNTER — Ambulatory Visit (INDEPENDENT_AMBULATORY_CARE_PROVIDER_SITE_OTHER): Payer: Medicare Other | Admitting: Cardiology

## 2015-11-04 VITALS — BP 114/62 | HR 89 | Ht 67.0 in | Wt 188.0 lb

## 2015-11-04 DIAGNOSIS — I480 Paroxysmal atrial fibrillation: Secondary | ICD-10-CM

## 2015-11-04 DIAGNOSIS — I420 Dilated cardiomyopathy: Secondary | ICD-10-CM | POA: Diagnosis not present

## 2015-11-04 MED ORDER — WARFARIN SODIUM 3 MG PO TABS: ORAL_TABLET | ORAL | Status: DC

## 2015-11-04 NOTE — Assessment & Plan Note (Signed)
Blood pressure controlled. Continue present medications. 

## 2015-11-04 NOTE — Assessment & Plan Note (Signed)
Patient remains in sinus rhythm on examination. He has resumed Coumadin. He has not had any recurrent bleeding. We will follow. Continue beta blocker.

## 2015-11-04 NOTE — Patient Instructions (Addendum)
Your physician wants you to follow-up in: 6 MONTHS WITH DR CRENSHAW You will receive a reminder letter in the mail two months in advance. If you don't receive a letter, please call our office to schedule the follow-up appointment.  

## 2015-11-04 NOTE — Assessment & Plan Note (Signed)
Patient's LV function mild to moderately reduced.Given age and I would like to be conservative if possible. He is not having dyspnea or chest pain. We will not pursue further ischemia evaluation. Continue beta blocker.

## 2015-11-08 ENCOUNTER — Ambulatory Visit (INDEPENDENT_AMBULATORY_CARE_PROVIDER_SITE_OTHER): Payer: Medicare Other | Admitting: General Practice

## 2015-11-08 DIAGNOSIS — I4891 Unspecified atrial fibrillation: Secondary | ICD-10-CM | POA: Diagnosis not present

## 2015-11-08 LAB — POCT INR: INR: 2.2

## 2015-11-08 NOTE — Progress Notes (Signed)
I have reviewed and agree with the plan. 

## 2015-11-08 NOTE — Progress Notes (Signed)
Pre visit review using our clinic review tool, if applicable. No additional management support is needed unless otherwise documented below in the visit note. 

## 2015-12-01 ENCOUNTER — Other Ambulatory Visit: Payer: Self-pay | Admitting: Internal Medicine

## 2015-12-03 ENCOUNTER — Other Ambulatory Visit: Payer: Self-pay | Admitting: Internal Medicine

## 2015-12-04 ENCOUNTER — Other Ambulatory Visit: Payer: Self-pay | Admitting: *Deleted

## 2015-12-04 MED ORDER — ATENOLOL 25 MG PO TABS: 12.5000 mg | ORAL_TABLET | Freq: Two times a day (BID) | ORAL | Status: DC

## 2015-12-06 ENCOUNTER — Ambulatory Visit (INDEPENDENT_AMBULATORY_CARE_PROVIDER_SITE_OTHER): Payer: Medicare Other | Admitting: General Practice

## 2015-12-06 DIAGNOSIS — I4891 Unspecified atrial fibrillation: Secondary | ICD-10-CM

## 2015-12-06 LAB — POCT INR: INR: 2.6

## 2015-12-06 NOTE — Progress Notes (Signed)
I have reviewed and agree with the plan. 

## 2015-12-06 NOTE — Progress Notes (Signed)
Pre visit review using our clinic review tool, if applicable. No additional management support is needed unless otherwise documented below in the visit note. 

## 2015-12-09 ENCOUNTER — Telehealth: Payer: Self-pay | Admitting: Internal Medicine

## 2015-12-09 NOTE — Telephone Encounter (Signed)
Pt daughter called in and is need refill on temazepam (RESTORIL) 30 MG capsule [[id number]]  Can this be called in without appt?  He is coming in on June 5th.  Daughter upset that refill was denied.

## 2015-12-10 ENCOUNTER — Other Ambulatory Visit: Payer: Self-pay | Admitting: Geriatric Medicine

## 2015-12-10 MED ORDER — TEMAZEPAM 30 MG PO CAPS: 30.0000 mg | ORAL_CAPSULE | Freq: Every day | ORAL | Status: DC

## 2015-12-10 NOTE — Telephone Encounter (Signed)
Sent to pharmacy 

## 2015-12-20 ENCOUNTER — Encounter: Payer: Self-pay | Admitting: Internal Medicine

## 2015-12-20 DIAGNOSIS — C3490 Malignant neoplasm of unspecified part of unspecified bronchus or lung: Secondary | ICD-10-CM | POA: Insufficient documentation

## 2015-12-23 ENCOUNTER — Encounter: Payer: Self-pay | Admitting: Internal Medicine

## 2015-12-23 ENCOUNTER — Ambulatory Visit (INDEPENDENT_AMBULATORY_CARE_PROVIDER_SITE_OTHER): Payer: Medicare Other | Admitting: Internal Medicine

## 2015-12-23 VITALS — BP 130/70 | HR 62 | Temp 98.4°F | Resp 20 | Ht 67.0 in | Wt 193.0 lb

## 2015-12-23 DIAGNOSIS — J8410 Pulmonary fibrosis, unspecified: Secondary | ICD-10-CM | POA: Diagnosis not present

## 2015-12-23 DIAGNOSIS — B0229 Other postherpetic nervous system involvement: Secondary | ICD-10-CM

## 2015-12-23 DIAGNOSIS — I10 Essential (primary) hypertension: Secondary | ICD-10-CM | POA: Diagnosis not present

## 2015-12-23 NOTE — Progress Notes (Signed)
   Subjective:    Patient ID: Ricardo Perkins, male    DOB: October 25, 1922, 82 y.o.   MRN: [id number]  HPI The patient is a 82 YO man coming in for pain in his side. He has been struggling with it for some time. Overall it is improving now. Went to see his cardiologist recently and was not having any chest pains. They are concerned about temazepam refill not done on 12/04/15 and I explained to them that it was not due to be refilled as it was filled for 3 month supply on 10/02/15. They are also concerned that they have to see the lung doctor and want me to fill his prednisone which I am not comfortable with.   Review of Systems  Constitutional: Negative.   Respiratory: Positive for shortness of breath. Negative for cough and chest tightness.   Cardiovascular: Negative for chest pain and leg swelling.  Gastrointestinal: Negative.   Skin: Negative.   Neurological: Negative for dizziness, weakness and numbness.  Psychiatric/Behavioral: Negative.       Objective:   Physical Exam  Constitutional: He is oriented to person, place, and time. He appears well-developed and well-nourished.  HENT:  Head: Normocephalic and atraumatic.  Eyes: EOM are normal.  Cardiovascular: Normal rate and regular rhythm.   Pulmonary/Chest: Effort normal and breath sounds normal. No respiratory distress. He exhibits no tenderness.  On Oxygen  Abdominal: Soft. He exhibits no distension. There is no tenderness. There is no rebound.  Musculoskeletal: He exhibits no edema.  Neurological: He is alert and oriented to person, place, and time.   Filed Vitals:   12/23/15 1610  BP: 130/70  Pulse: 62  Temp: 98.4 F (36.9 C)  TempSrc: Oral  Resp: 20  Height: '5\' 7"'$  (1.702 m)  Weight: 193 lb (87.544 kg)  SpO2: 86%      Assessment & Plan:

## 2015-12-23 NOTE — Patient Instructions (Signed)
The temazepam refill has been sent in to the pharmacy.

## 2015-12-23 NOTE — Progress Notes (Signed)
Pre visit review using our clinic review tool, if applicable. No additional management support is needed unless otherwise documented below in the visit note. 

## 2015-12-24 NOTE — Assessment & Plan Note (Signed)
Still present in right side but less often and less intense. No meds needed at this time.

## 2015-12-24 NOTE — Assessment & Plan Note (Signed)
I do feel that given the severity of his lung condition that he benefits from seeing a pulmonologist and they will keep their visit with Dr. Melvyn Novas.

## 2015-12-24 NOTE — Assessment & Plan Note (Signed)
BP at goal on atenolol and amlodipine. No adjustment today and last BMP at goal.

## 2016-01-01 ENCOUNTER — Other Ambulatory Visit: Payer: Self-pay | Admitting: Internal Medicine

## 2016-01-02 ENCOUNTER — Telehealth: Payer: Self-pay

## 2016-01-02 NOTE — Telephone Encounter (Signed)
PA initiated via CoverMyMeds key [id number]

## 2016-01-02 NOTE — Telephone Encounter (Signed)
Sent to pharmacy 

## 2016-01-02 NOTE — Telephone Encounter (Signed)
APPROVED 01/02/2016 - 01/01/2017

## 2016-01-03 ENCOUNTER — Ambulatory Visit (INDEPENDENT_AMBULATORY_CARE_PROVIDER_SITE_OTHER): Payer: Medicare Other | Admitting: General Practice

## 2016-01-03 DIAGNOSIS — I4891 Unspecified atrial fibrillation: Secondary | ICD-10-CM

## 2016-01-03 LAB — POCT INR: INR: 2.2

## 2016-01-03 NOTE — Progress Notes (Signed)
Pre visit review using our clinic review tool, if applicable. No additional management support is needed unless otherwise documented below in the visit note. 

## 2016-01-05 NOTE — Progress Notes (Signed)
I have reviewed and agree with the plan. 

## 2016-01-06 ENCOUNTER — Ambulatory Visit: Payer: Medicare Other | Admitting: Internal Medicine

## 2016-01-20 ENCOUNTER — Emergency Department (HOSPITAL_COMMUNITY)
Admission: EM | Admit: 2016-01-20 | Discharge: 2016-01-20 | Disposition: A | Discharge status: Home / Self Care | Payer: Medicare Other | Attending: Emergency Medicine | Admitting: Emergency Medicine

## 2016-01-20 ENCOUNTER — Encounter (HOSPITAL_COMMUNITY): Payer: Self-pay | Admitting: Emergency Medicine

## 2016-01-20 ENCOUNTER — Emergency Department (HOSPITAL_COMMUNITY): Payer: Medicare Other

## 2016-01-20 ENCOUNTER — Telehealth: Payer: Self-pay | Admitting: *Deleted

## 2016-01-20 DIAGNOSIS — S199XXA Unspecified injury of neck, initial encounter: Secondary | ICD-10-CM | POA: Diagnosis not present

## 2016-01-20 DIAGNOSIS — R51 Headache: Secondary | ICD-10-CM | POA: Diagnosis not present

## 2016-01-20 DIAGNOSIS — S0990XA Unspecified injury of head, initial encounter: Secondary | ICD-10-CM | POA: Diagnosis not present

## 2016-01-20 DIAGNOSIS — Y92009 Unspecified place in unspecified non-institutional (private) residence as the place of occurrence of the external cause: Secondary | ICD-10-CM | POA: Diagnosis not present

## 2016-01-20 DIAGNOSIS — M1990 Unspecified osteoarthritis, unspecified site: Secondary | ICD-10-CM | POA: Insufficient documentation

## 2016-01-20 DIAGNOSIS — Y999 Unspecified external cause status: Secondary | ICD-10-CM | POA: Insufficient documentation

## 2016-01-20 DIAGNOSIS — Z79899 Other long term (current) drug therapy: Secondary | ICD-10-CM | POA: Insufficient documentation

## 2016-01-20 DIAGNOSIS — S7001XA Contusion of right hip, initial encounter: Secondary | ICD-10-CM

## 2016-01-20 DIAGNOSIS — Z87891 Personal history of nicotine dependence: Secondary | ICD-10-CM | POA: Diagnosis not present

## 2016-01-20 DIAGNOSIS — M25551 Pain in right hip: Secondary | ICD-10-CM | POA: Diagnosis not present

## 2016-01-20 DIAGNOSIS — W010XXA Fall on same level from slipping, tripping and stumbling without subsequent striking against object, initial encounter: Secondary | ICD-10-CM

## 2016-01-20 DIAGNOSIS — R911 Solitary pulmonary nodule: Secondary | ICD-10-CM

## 2016-01-20 DIAGNOSIS — I10 Essential (primary) hypertension: Secondary | ICD-10-CM | POA: Diagnosis not present

## 2016-01-20 DIAGNOSIS — Y939 Activity, unspecified: Secondary | ICD-10-CM | POA: Insufficient documentation

## 2016-01-20 DIAGNOSIS — R259 Unspecified abnormal involuntary movements: Secondary | ICD-10-CM | POA: Diagnosis not present

## 2016-01-20 DIAGNOSIS — W1830XA Fall on same level, unspecified, initial encounter: Secondary | ICD-10-CM | POA: Insufficient documentation

## 2016-01-20 DIAGNOSIS — S32020A Wedge compression fracture of second lumbar vertebra, initial encounter for closed fracture: Secondary | ICD-10-CM | POA: Diagnosis not present

## 2016-01-20 DIAGNOSIS — J449 Chronic obstructive pulmonary disease, unspecified: Secondary | ICD-10-CM | POA: Insufficient documentation

## 2016-01-20 DIAGNOSIS — I480 Paroxysmal atrial fibrillation: Secondary | ICD-10-CM | POA: Diagnosis not present

## 2016-01-20 DIAGNOSIS — S32000A Wedge compression fracture of unspecified lumbar vertebra, initial encounter for closed fracture: Secondary | ICD-10-CM | POA: Diagnosis not present

## 2016-01-20 DIAGNOSIS — M25559 Pain in unspecified hip: Secondary | ICD-10-CM | POA: Diagnosis not present

## 2016-01-20 DIAGNOSIS — S79911A Unspecified injury of right hip, initial encounter: Secondary | ICD-10-CM | POA: Diagnosis present

## 2016-01-20 LAB — COMPREHENSIVE METABOLIC PANEL
ALBUMIN: 4 g/dL (ref 3.5–5.0)
ALK PHOS: 53 U/L (ref 38–126)
ALT: 17 U/L (ref 17–63)
AST: 15 U/L (ref 15–41)
Anion gap: 9 (ref 5–15)
BUN: 25 mg/dL — AB (ref 6–20)
CALCIUM: 8.8 mg/dL — AB (ref 8.9–10.3)
CHLORIDE: 107 mmol/L (ref 101–111)
CO2: 22 mmol/L (ref 22–32)
CREATININE: 1.31 mg/dL — AB (ref 0.61–1.24)
GFR calc Af Amer: 53 mL/min — ABNORMAL LOW (ref >60)
GFR calc non Af Amer: 46 mL/min — ABNORMAL LOW (ref >60)
GLUCOSE: 141 mg/dL — AB (ref 65–99)
Potassium: 4 mmol/L (ref 3.5–5.1)
SODIUM: 138 mmol/L (ref 135–145)
Total Bilirubin: 1.4 mg/dL — ABNORMAL HIGH (ref 0.3–1.2)
Total Protein: 6.6 g/dL (ref 6.5–8.1)

## 2016-01-20 LAB — CBC WITH DIFFERENTIAL/PLATELET
BASOS PCT: 0 %
Basophils Absolute: 0 10*3/uL (ref 0.0–0.1)
EOS ABS: 0 10*3/uL (ref 0.0–0.7)
Eosinophils Relative: 1 %
HCT: 40.5 % (ref 39.0–52.0)
HEMOGLOBIN: 12.7 g/dL — AB (ref 13.0–17.0)
LYMPHS ABS: 0.5 10*3/uL — AB (ref 0.7–4.0)
Lymphocytes Relative: 8 %
MCH: 27.1 pg (ref 26.0–34.0)
MCHC: 31.4 g/dL (ref 30.0–36.0)
MCV: 86.5 fL (ref 78.0–100.0)
MONO ABS: 0.2 10*3/uL (ref 0.1–1.0)
MONOS PCT: 3 %
NEUTROS PCT: 88 %
Neutro Abs: 5.3 10*3/uL (ref 1.7–7.7)
Platelets: 186 10*3/uL (ref 150–400)
RBC: 4.68 MIL/uL (ref 4.22–5.81)
RDW: 15.5 % (ref 11.5–15.5)
WBC: 6 10*3/uL (ref 4.0–10.5)

## 2016-01-20 LAB — TYPE AND SCREEN
ABO/RH(D): O POS
ANTIBODY SCREEN: NEGATIVE

## 2016-01-20 LAB — PROTIME-INR
INR: 2.28 — AB (ref 0.00–1.49)
Prothrombin Time: 24.2 seconds — ABNORMAL HIGH (ref 11.6–15.2)

## 2016-01-20 LAB — CK: CK TOTAL: 80 U/L (ref 49–397)

## 2016-01-20 MED ORDER — ONDANSETRON HCL 4 MG/2ML IJ SOLN
4.0000 mg | Freq: Once | INTRAMUSCULAR | Status: AC
  Administered 2016-01-20: 4 mg via INTRAVENOUS
  Filled 2016-01-20: qty 2

## 2016-01-20 MED ORDER — MORPHINE SULFATE (PF) 4 MG/ML IV SOLN
4.0000 mg | Freq: Once | INTRAVENOUS | Status: AC
  Administered 2016-01-20: 4 mg via INTRAVENOUS
  Filled 2016-01-20: qty 1

## 2016-01-20 MED ORDER — OXYCODONE-ACETAMINOPHEN 5-325 MG PO TABS: 1.0000 | ORAL_TABLET | ORAL | Status: DC | PRN

## 2016-01-20 MED ORDER — TRAMADOL HCL 50 MG PO TABS
50.0000 mg | ORAL_TABLET | Freq: Four times a day (QID) | ORAL | Status: DC | PRN
Start: 2016-01-20 — End: 2017-01-27

## 2016-01-20 NOTE — ED Notes (Signed)
Per [facility], patient is from home.  Patient fell yesterday at home and laid on the grass for 3 hours before receiving help.  He is nausea and drinking fluids.  Patient denies hitting head or LOC.  He was evaluated by a family friend Physician.  Patient received tramadol at 8 am today.   BP: 114/62 HR:86; then 91% on 5 L R: 18  History of A-FIB

## 2016-01-20 NOTE — ED Provider Notes (Signed)
CSN: [id number]     Arrival date & time 01/20/16  1126 History   First MD Initiated Contact with Patient 01/20/16 1156     Chief Complaint  Patient presents with  . Fall  . Hip Pain     (Consider location/radiation/quality/duration/timing/severity/associated sxs/prior Treatment) Patient is a 82 y.o. male presenting with fall and hip pain. The history is provided by the patient and a relative.  Fall  Hip Pain  Yesterday, he slipped on some grass and fell landing on his back. Is complaining of pain in his back and in his right hip. File and the grasper 3 hours because he says that there was nothing to grab on to try to stand up. Per family member, he did attempt to ambulate but noted significant pain with ambulation. He denies head injury or loss of consciousness, but he is on warfarin for atrial fibrillation. He states that he is pain-free so long as he is lying still in bed. Family member has withheld his pain medications and that we can get a better examination in the ED.  Past Medical History  Diagnosis Date  . INSOMNIA, PERSISTENT   . DECREASED HEARING   . HYPERTENSION   . BRADYCARDIA     1st degree AVB  . RENAL INSUFFICIENCY   . BENIGN PROSTATIC HYPERTROPHY, WITH OBSTRUCTION   . ASTEATOTIC ECZEMA   . LEG EDEMA, BILATERAL   . PAROXYSMAL ATRIAL FIBRILLATION     coumadin rx  . Arthritis     knees, shoulders   . COPD (chronic obstructive pulmonary disease) (HCC)     on chronic oxygen therapy qhs>daytime  . Advanced age   . Hx of echocardiogram     a. echo in 2009 (in [place]): normal EF, mild LVH;   b. echo 10/13:  mid Ant HK, mod LVH, EF 55%, trivial AI, mod LAE, mild RVE, mild reduced RVSF, PASP 46  . Hx of cardiovascular stress test     a.  MV 4/09:  possible small area of lateral ischemia at the apex, but Dr. Kirk Ruths reviewed this, and in fact, felt it was most likely normal.  b.  Myoview 3/13 was again low risk with an EF of 62% with small reversible defect in the  apex suggestive of very mild apical ischemia.  . Large bilateral inguinal hernias - chronic, containing small bowel (right) and sigmoid colon (left) 01/15/2011    s/p B repair  . OA (osteoarthritis) of knee     S/p right TKR March '13 (Dr. Wynelle Link)   . RHINITIS, VASOMOTOR 01/07/2010   Past Surgical History  Procedure Laterality Date  . Appendectomy      @ 23  . Tonsillectomy    . Hernia repair  01/2011    bilateral inguinal hernia   . Eye surgery      bilateral cataract surgery   . Other surgical history      left finger surgery   . Knee surgery  right  . Total knee arthroplasty  10/26/2011    Procedure: TOTAL KNEE ARTHROPLASTY;  Surgeon: Gearlean Alf, MD;  Location: [facility];  Service: Orthopedics;  Laterality: Right;   Family History  Problem Relation Age of Onset  . Heart disease Father   . Heart attack Father   . Cancer Mother     colon   . Cirrhosis Sister     liver failure  . Heart attack Brother   . Diabetes Neg Hx    Social History  Substance  Use Topics  . Smoking status: Former Smoker -- 1.00 packs/day for 30 years    Types: Cigarettes    Quit date: 07/20/1998  . Smokeless tobacco: Never Used  . Alcohol Use: 0.0 oz/week    0 Standard drinks or equivalent per week     Comment: wine occasional     Review of Systems  All other systems reviewed and are negative.     Allergies  Review of patient's allergies indicates no known allergies.  Home Medications   Prior to Admission medications   Medication Sig Start Date End Date Taking? Authorizing Provider  amLODipine (NORVASC) 10 MG tablet Take 1 tablet (10 mg total) by mouth daily. 06/10/15   [name], MD  atenolol (TENORMIN) 25 MG tablet Take 0.5 tablets (12.5 mg total) by mouth 2 (two) times daily. 12/04/15   [name], MD  gabapentin (NEURONTIN) 100 MG capsule Take 1 capsule (100 mg total) by mouth at bedtime. Can take 2 capsules at night after first 7 days Patient not taking:  Reported on 12/23/2015 08/30/15   Rubbie Battiest, NP  lidocaine (LIDODERM) 5 % Place 1 patch onto the skin daily. Remove & Discard patch within 12 hours or as directed by MD Patient not taking: Reported on 12/23/2015 09/13/15   [name], MD  omeprazole (PRILOSEC) 20 MG capsule TAKE ONE CAPSULE BY MOUTH EVERY DAY AS NEEDED Patient taking differently: TAKE ONE CAPSULE BY MOUTH EVERY DAY AS NEEDED FOR HEARTBURN OR INDIGESTION 06/10/15   [name], MD  predniSONE (DELTASONE) 10 MG tablet TAKE 1 TABLET BY MOUTH ONCE A DAY WITH BREAKFAST AS DIRECTED 01/02/16   Tanda Rockers, MD  tamsulosin (FLOMAX) 0.4 MG CAPS capsule Take 1 capsule (0.4 mg total) by mouth daily after supper. --- Pt needs appt for further refills 08/16/15   [name], MD  temazepam (RESTORIL) 30 MG capsule TAKE ONE CAPSULE BY MOUTH EVERY NIGHT AT BEDTIME. 01/02/16   [name], MD  warfarin (COUMADIN) 3 MG tablet AS DIRECTED 11/04/15   Lelon Perla, MD   BP 144/90 mmHg  Pulse 74  Temp(Src) 98 F (36.7 C) (Oral)  Resp 18  Ht '5\' 7"'$  (1.702 m)  Wt 189 lb (85.73 kg)  BMI 29.59 kg/m2  SpO2 89% Physical Exam  Nursing note and vitals reviewed.  82 year old male, resting comfortably and in no acute distress. Vital signs are significant for hypertension. Oxygen saturation is 89%, which is hypoxic. Head is normocephalic and atraumatic. PERRLA, EOMI. Oropharynx is clear. Neck is nontender without adenopathy or JVD. Back is tender throughout the lumbar spine. There is no CVA tenderness. Lungs are clear without rales, wheezes, or rhonchi. Chest is nontender. Heart has regular rate and rhythm without murmur. Abdomen is soft, flat, nontender without masses or hepatosplenomegaly and peristalsis is normoactive. Extremities: There is tenderness to palpation the posterior and lateral aspect of the right hip, but there is full passive range of motion without obvious pain. There is 1+ pitting edema bilaterally  with moderate venous stasis changes. Full range of motion of all other joints without pain. Skin is warm and dry without rash. Neurologic: Mental status is normal, cranial nerves are intact, there are no motor or sensory deficits.  ED Course  Procedures (including critical care time) Labs Review Results for orders placed or performed during the hospital encounter of 01/20/16  Comprehensive metabolic panel  Result Value Ref Range   Sodium 138 135 - 145  mmol/L   Potassium 4.0 3.5 - 5.1 mmol/L   Chloride 107 101 - 111 mmol/L   CO2 22 22 - 32 mmol/L   Glucose, Bld 141 (H) 65 - 99 mg/dL   BUN 25 (H) 6 - 20 mg/dL   Creatinine, Ser 1.31 (H) 0.61 - 1.24 mg/dL   Calcium 8.8 (L) 8.9 - 10.3 mg/dL   Total Protein 6.6 6.5 - 8.1 g/dL   Albumin 4.0 3.5 - 5.0 g/dL   AST 15 15 - 41 U/L   ALT 17 17 - 63 U/L   Alkaline Phosphatase 53 38 - 126 U/L   Total Bilirubin 1.4 (H) 0.3 - 1.2 mg/dL   GFR calc non Af Amer 46 (L) >60 mL/min   GFR calc Af Amer 53 (L) >60 mL/min   Anion gap 9 5 - 15  CBC with Differential  Result Value Ref Range   WBC 6.0 4.0 - 10.5 K/uL   RBC 4.68 4.22 - 5.81 MIL/uL   Hemoglobin 12.7 (L) 13.0 - 17.0 g/dL   HCT 40.5 39.0 - 52.0 %   MCV 86.5 78.0 - 100.0 fL   MCH 27.1 26.0 - 34.0 pg   MCHC 31.4 30.0 - 36.0 g/dL   RDW 15.5 11.5 - 15.5 %   Platelets 186 150 - 400 K/uL   Neutrophils Relative % 88 %   Neutro Abs 5.3 1.7 - 7.7 K/uL   Lymphocytes Relative 8 %   Lymphs Abs 0.5 (L) 0.7 - 4.0 K/uL   Monocytes Relative 3 %   Monocytes Absolute 0.2 0.1 - 1.0 K/uL   Eosinophils Relative 1 %   Eosinophils Absolute 0.0 0.0 - 0.7 K/uL   Basophils Relative 0 %   Basophils Absolute 0.0 0.0 - 0.1 K/uL  Protime-INR  Result Value Ref Range   Prothrombin Time 24.2 (H) 11.6 - 15.2 seconds   INR 2.28 (H) 0.00 - 1.49  CK  Result Value Ref Range   Total CK 80 49 - 397 U/L  Type and screen [place]  Result Value Ref Range   ABO/RH(D) O POS    Antibody Screen NEG     Sample Expiration 01/23/2016     Imaging Review Dg Lumbar Spine Complete  01/20/2016  CLINICAL DATA:  Fall. EXAM: LUMBAR SPINE - COMPLETE 4+ VIEW COMPARISON:  CT 02/26/2012. FINDINGS: Diffuse multilevel degenerative change. Moderate compression of L2 vertebral body noted. Aortic atherosclerotic vascular calcification noted. Aneurysmal dilatation of the abdominal aorta up to 3.6 cm again noted. Similar findings noted on recent CT. IMPRESSION: 1.  New moderate compression of L2 vertebral body. 2. 3.6 cm abdominal aortic aneurysm, similar findings noted on prior CT of 10/03/2015. Electronically Signed   By: Marcello Moores  Register   On: 01/20/2016 13:15   Ct Head Wo Contrast  01/20/2016  CLINICAL DATA:  Fall yesterday. EXAM: CT HEAD WITHOUT CONTRAST CT CERVICAL SPINE WITHOUT CONTRAST TECHNIQUE: Multidetector CT imaging of the head and cervical spine was performed following the standard protocol without intravenous contrast. Multiplanar CT image reconstructions of the cervical spine were also generated. COMPARISON:  CT head 06/03/2015 FINDINGS: CT HEAD FINDINGS There is atrophy and chronic small vessel disease changes. No acute intracranial abnormality. Specifically, no hemorrhage, hydrocephalus, mass lesion, acute infarction, or significant intracranial injury. No acute calvarial abnormality. Visualized paranasal sinuses and mastoids clear. Orbital soft tissues unremarkable. CT CERVICAL SPINE FINDINGS Diffuse degenerative disc disease and facet disease throughout the cervical spine. Slight anterolisthesis of C4 on C5 related to  facet disease. Prevertebral soft tissues are normal. No fracture. No epidural or paraspinal hematoma. IMPRESSION: No acute intracranial abnormality. Atrophy, chronic microvascular disease. Cervical spondylosis.  No acute bony abnormality. Electronically Signed   By: Rolm Baptise M.D.   On: 01/20/2016 13:01   Ct Cervical Spine Wo Contrast  01/20/2016  CLINICAL DATA:  Fall yesterday. EXAM: CT  HEAD WITHOUT CONTRAST CT CERVICAL SPINE WITHOUT CONTRAST TECHNIQUE: Multidetector CT imaging of the head and cervical spine was performed following the standard protocol without intravenous contrast. Multiplanar CT image reconstructions of the cervical spine were also generated. COMPARISON:  CT head 06/03/2015 FINDINGS: CT HEAD FINDINGS There is atrophy and chronic small vessel disease changes. No acute intracranial abnormality. Specifically, no hemorrhage, hydrocephalus, mass lesion, acute infarction, or significant intracranial injury. No acute calvarial abnormality. Visualized paranasal sinuses and mastoids clear. Orbital soft tissues unremarkable. CT CERVICAL SPINE FINDINGS Diffuse degenerative disc disease and facet disease throughout the cervical spine. Slight anterolisthesis of C4 on C5 related to facet disease. Prevertebral soft tissues are normal. No fracture. No epidural or paraspinal hematoma. IMPRESSION: No acute intracranial abnormality. Atrophy, chronic microvascular disease. Cervical spondylosis.  No acute bony abnormality. Electronically Signed   By: Rolm Baptise M.D.   On: 01/20/2016 13:01   Ct Hip Right Wo Contrast  01/20/2016  CLINICAL DATA:  Status fall yesterday. EXAM: CT OF THE RIGHT HIP WITHOUT CONTRAST TECHNIQUE: Multidetector CT imaging of the right hip was performed according to the standard protocol. Multiplanar CT image reconstructions were also generated. COMPARISON:  None. FINDINGS: Bones/Joint/Cartilage No aggressive lytic or sclerotic osseous lesion. Mild osteoarthritis of the right hip. Mild degenerative changes of the right sacroiliac joint. No fracture or dislocation. Normal alignment. Muscles Normal. Soft tissue No fluid collection or hematoma.  No soft tissue mass. Other: Abdominal aortic atherosclerosis. Peripheral vascular atherosclerotic disease. Partially visualized is sigmoid diverticulosis. IMPRESSION: 1. No acute osseous injury of the right hip. 2.  Aortic  Atherosclerosis (ICD10-170.0) Electronically Signed   By: Kathreen Devoid   On: 01/20/2016 15:08   Dg Hip Unilat With Pelvis 2-3 Views Right  01/20/2016  CLINICAL DATA:  Status post fall yesterday. Continued right hip pain. Initial encounter. EXAM: DG HIP (WITH OR WITHOUT PELVIS) 2-3V RIGHT COMPARISON:  CT abdomen and pelvis 10/03/2015. FINDINGS: No acute bony or joint abnormality is identified. Minimal degenerative change is present about the hips. No focal bony lesion. IMPRESSION: Negative exam. Electronically Signed   By: Inge Rise M.D.   On: 01/20/2016 13:14   I have personally reviewed and evaluated these images and lab results as part of my medical decision-making.   MDM   Final diagnoses:  Fall from slipping, initial encounter  Compression fracture of L2 lumbar vertebra, closed, initial encounter ([place])  Contusion of right hip, initial encounter   Fall with injury to back and right hip. He is being sent for x-rays. In spite of lack of head trauma, he will be sent for CT of head based on mechanism of injury and anticoagulated state. Old records are reviewed and last INR was on June 16 and was therapeutic.  Hip x-rays are negative for fracture. Lumbar spine x-ray show recent compression fracture of L2 which probably accounts for his pain. However, I was concerned about his difficulty walking. CT was obtained which showed no evidence of fracture. He will be ambulated in the ED and, if successful, sent home to follow-up with his PCP. I did discuss possibility of kyphoplasty with family. This would have 3  arranged through the PCP. He is given prescription for tramadol for pain since family states that he sometimes gets confused with stronger pain medicine. He is also given a prescription for a small number of oxycodone-acetaminophen to use if needed for pain not controlled by tramadol.  Delora Fuel, MD 29/56/21 [id number]

## 2016-01-20 NOTE — Telephone Encounter (Signed)
Spoke with Vickie, pt's daughter and notified time for ct chest  She verbalized understanding and order was sent to [facility]

## 2016-01-20 NOTE — ED Notes (Signed)
Bed: WA21 Expected date:  Expected time:  Means of arrival:  Comments: Fall yesterday

## 2016-01-20 NOTE — ED Provider Notes (Signed)
Patient accepted in sign out pending trial of ambulation. Patient tolerated ambulation without difficulty.  Per previous plan patient discharged home in the care of his family.  Harvel Quale, MD 01/20/16 [phone]

## 2016-01-20 NOTE — ED Notes (Signed)
Successfully ambulated pt, he tolerated the activity well.

## 2016-01-20 NOTE — Telephone Encounter (Signed)
-----   Message from Tanda Rockers, MD sent at 12/20/2015  1:04 PM EDT ----- Should be  repeat 01/23/16  With no contrast (not High res study) f/u SPN ----- Message -----    From: Rosana Berger, CMA    Sent: 12/19/2015   5:23 PM      To: Tanda Rockers, MD  Does he need ct chest without or HRCT? Just want to make sure b/c the one done 1 yr ago was HRCT but he had ct chest without in Jan and looks like you did not result on it?  ----- Message -----    From: Tanda Rockers, MD    Sent: 07/29/2015  10:03 AM      To: Rosana Berger, CMA  CT s contrast due

## 2016-01-20 NOTE — Discharge Instructions (Signed)
Talk with her doctor about possible referral to interventional radiology for kyphoplasty.  Fall Prevention in the Home  Falls can cause injuries and can affect people from all age groups. There are many simple things that you can do to make your home safe and to help prevent falls. WHAT CAN I DO ON THE OUTSIDE OF MY HOME?  Regularly repair the edges of walkways and driveways and fix any cracks.  Remove high doorway thresholds.  Trim any shrubbery on the main path into your home.  Use bright outdoor lighting.  Clear walkways of debris and clutter, including tools and rocks.  Regularly check that handrails are securely fastened and in good repair. Both sides of any steps should have handrails.  Install guardrails along the edges of any raised decks or porches.  Have leaves, snow, and ice cleared regularly.  Use sand or salt on walkways during winter months.  In the garage, clean up any spills right away, including grease or oil spills. WHAT CAN I DO IN THE BATHROOM?  Use night lights.  Install grab bars by the toilet and in the tub and shower. Do not use towel bars as grab bars.  Use non-skid mats or decals on the floor of the tub or shower.  If you need to sit down while you are in the shower, use a plastic, non-slip stool.[employer]  Keep the floor dry. Immediately clean up any water that spills on the floor.  Remove soap buildup in the tub or shower on a regular basis.  Attach bath mats securely with double-sided non-slip rug tape.  Remove throw rugs and other tripping hazards from the floor. WHAT CAN I DO IN THE BEDROOM?  Use night lights.  Make sure that a bedside light is easy to reach.  Do not use oversized bedding that drapes onto the floor.  Have a firm chair that has side arms to use for getting dressed.  Remove throw rugs and other tripping hazards from the floor. WHAT CAN I DO IN THE KITCHEN?   Clean up any spills right away.  Avoid walking on wet  floors.  Place frequently used items in easy-to-reach places.  If you need to reach for something above you, use a sturdy step stool that has a grab bar.  Keep electrical cables out of the way.  Do not use floor polish or wax that makes floors slippery. If you have to use wax, make sure that it is non-skid floor wax.  Remove throw rugs and other tripping hazards from the floor. WHAT CAN I DO IN THE STAIRWAYS?  Do not leave any items on the stairs.  Make sure that there are handrails on both sides of the stairs. Fix handrails that are broken or loose. Make sure that handrails are as long as the stairways.  Check any carpeting to make sure that it is firmly attached to the stairs. Fix any carpet that is loose or worn.  Avoid having throw rugs at the top or bottom of stairways, or secure the rugs with carpet tape to prevent them from moving.  Make sure that you have a light switch at the top of the stairs and the bottom of the stairs. If you do not have them, have them installed. WHAT ARE SOME OTHER FALL PREVENTION TIPS?  Wear closed-toe shoes that fit well and support your feet. Wear shoes that have rubber soles or low heels.  When you use a stepladder, make sure that it is completely opened and  that the sides are firmly locked. Have someone hold the ladder while you are using it. Do not climb a closed stepladder.  Add color or contrast paint or tape to grab bars and handrails in your home. Place contrasting color strips on the first and last steps.  Use mobility aids as needed, such as canes, walkers, scooters, and crutches.  Turn on lights if it is dark. Replace any light bulbs that burn out.  Set up furniture so that there are clear paths. Keep the furniture in the same spot.  Fix any uneven floor surfaces.  Choose a carpet design that does not hide the edge of steps of a stairway.  Be aware of any and all pets.  Review your medicines with your healthcare provider. Some  medicines can cause dizziness or changes in blood pressure, which increase your risk of falling. Talk with your health care provider about other ways that you can decrease your risk of falls. This may include working with a physical therapist or trainer to improve your strength, balance, and endurance.   This information is not intended to replace advice given to you by your health care provider. Make sure you discuss any questions you have with your health care provider.   Document Released: 06/26/2002 Document Revised: 11/20/2014 Document Reviewed: 08/10/2014 Elsevier Interactive Patient Education 2016 [employer]  Vertebral Fracture A vertebral fracture is a break in one of the bones that make up the spine (vertebrae). The vertebrae are stacked on top of each other to form the spinal column. They support the body and protect the spinal cord. The vertebral column has an upper part (cervical spine), a middle part (thoracic spine), and a lower part (lumbar spine). Most vertebral fractures occur in the thoracic spine or lumbar spine. There are three main types of vertebral fractures:  Flexion fracture. This happens when vertebrae collapse. Vertebrae can collapse:  In the front (compression fracture). This type of fracture is common in people who have a condition that causes their bones to be weak and brittle (osteoporosis). The fracture can make a person lose height.  In the front and back (axial burst fracture).  Extension fracture. This happens when an external force pulls apart the vertebrae.  Rotation fracture. This happens when the spine bends extremely in one direction. This type can cause a piece of a vertebra to break off (transverse process fracture) or move out of its normal position (fracture dislocation). This type of fracture has a high risk for spinal cord injury. Vertebral fractures can range from mild to very severe. The most severe types are those that cause the broken bones  to move out of place (unstable) and those that injure or press on the spinal cord. CAUSES This condition is usually caused by a forceful injury. This type of injury commonly results from:  Car accidents.  Falling or jumping from a great height.  Collisions in contact sports.  Violent acts, such as an assault or a gunshot wound. RISK FACTORS This injury is more likely to happen to people who:  Have osteoporosis.  Participate in contact sports.  Are in situations that could result in falls or other violent injuries. SYMPTOMS Symptoms of this injury depend on the location and the type of fracture. The most common symptom is back pain that gets worse with movement. You may also have trouble standing or walking. If a fracture has damaged your spinal cord or is pressing on it, you may also have:  Numbness.  Tingling.  Weakness.  Loss of movement.  Loss of bowel or bladder control. DIAGNOSIS This injury may be diagnosed based on symptoms, medical history, and a physical exam. You may also have imaging tests to confirm the diagnosis. These may include:  Spine X-ray.  CT scan.  MRI. TREATMENT Treatment for this injury depends on the type of fracture. If your fracture is stable and does not affect your spinal cord, it may heal with nonsurgical treatment, such as:  Taking pain medicine.  Wearing a cast or a brace.  Doing physical therapy exercises. If your vertebral fracture is unstable or it affects your spinal cord, you may need surgical treatment, such as:  Laminectomy. This procedure involves removing the part of a vertebra that is pushing on the spinal cord (spinal decompression surgery). Bone fragments may also be removed.  Spinal fusion. This procedure is used to stabilize an unstable fracture. Vertebrae may be joined together with a piece of bone from another part of your body (graft) and held in place with rods, plates, or screws.  Vertebroplasty. In this procedure,  bone cement is used to rebuild collapsed vertebrae. HOME CARE INSTRUCTIONS General Instructions  Take medicines only as directed by your health care provider.  Do not drive or operate heavy machinery while taking pain medicine.  If directed, apply ice to the injured area:  Put ice in a plastic bag.  Place a towel between your skin and the bag.  Leave the ice on for 30 minutes every two hours at first. Then apply the ice as needed.  Wear your neck brace or back brace as directed by your health care provider.  Do not drink alcohol. Alcohol can interfere with your treatment.  Keep all follow-up visits as directed by your health care provider. This is important. It can help to prevent permanent injury, disability, and long-lasting (chronic) pain. Activity  Stay in bed (on bed rest) only as directed by your health care provider. Being on bed rest for too long can make your condition worse.  Return to your normal activities as directed by your health care provider. Ask what activities are safe for you.  Do exercises to improve motion and strength in your back (physical therapy), as recommended by your health care provider.   Exercise regularly as directed by your health care provider. SEEK MEDICAL CARE IF:  You have a fever.  You develop a cough that makes your pain worse.  Your pain medicine is not helping.  Your pain does not get better over time.  You cannot return to your normal activities as planned or expected. SEEK IMMEDIATE MEDICAL CARE IF:  Your pain is very bad and it suddenly gets worse.  You are unable to move any body part (paralysis) that is below the level of your injury.  You have numbness, tingling, or weakness in any body part that is below the level of your injury.  You cannot control your bladder or bowels.   This information is not intended to replace advice given to you by your health care provider. Make sure you discuss any questions you have with  your health care provider.   Document Released: 08/13/2004 Document Revised: 11/20/2014 Document Reviewed: 07/11/2014 Elsevier Interactive Patient Education 2016 [employer]  Tramadol tablets What is this medicine? TRAMADOL (TRA ma dole) is a pain reliever. It is used to treat moderate to severe pain in adults. This medicine may be used for other purposes; ask your health care provider or pharmacist if you have  questions. What should I tell my health care provider before I take this medicine? They need to know if you have any of these conditions: -brain tumor -depression -drug abuse or addiction -head injury -if you frequently drink alcohol containing drinks -kidney disease or trouble passing urine -liver disease -lung disease, asthma, or breathing problems -seizures or epilepsy -suicidal thoughts, plans, or attempt; a previous suicide attempt by you or a family member -an unusual or allergic reaction to tramadol, codeine, other medicines, foods, dyes, or preservatives -pregnant or trying to get pregnant -breast-feeding How should I use this medicine? Take this medicine by mouth with a full glass of water. Follow the directions on the prescription label. If the medicine upsets your stomach, take it with food or milk. Do not take more medicine than you are told to take. Talk to your pediatrician regarding the use of this medicine in children. Special care may be needed. Overdosage: If you think you have taken too much of this medicine contact a poison control center or emergency room at once. NOTE: This medicine is only for you. Do not share this medicine with others. What if I miss a dose? If you miss a dose, take it as soon as you can. If it is almost time for your next dose, take only that dose. Do not take double or extra doses. What may interact with this medicine? Do not take this medicine with any of the following medications: -MAOIs like Carbex, Eldepryl, Marplan, Nardil,  and Parnate This medicine may also interact with the following medications: -alcohol or medicines that contain alcohol -antihistamines -benzodiazepines -bupropion -carbamazepine or oxcarbazepine -clozapine -cyclobenzaprine -digoxin -furazolidone -linezolid -medicines for depression, anxiety, or psychotic disturbances -medicines for migraine headache like almotriptan, eletriptan, frovatriptan, naratriptan, rizatriptan, sumatriptan, zolmitriptan -medicines for pain like pentazocine, buprenorphine, butorphanol, meperidine, nalbuphine, and propoxyphene -medicines for sleep -muscle relaxants -naltrexone -phenobarbital -phenothiazines like perphenazine, thioridazine, chlorpromazine, mesoridazine, fluphenazine, prochlorperazine, promazine, and trifluoperazine -procarbazine -warfarin This list may not describe all possible interactions. Give your health care provider a list of all the medicines, herbs, non-prescription drugs, or dietary supplements you use. Also tell them if you smoke, drink alcohol, or use illegal drugs. Some items may interact with your medicine. What should I watch for while using this medicine? Tell your doctor or health care professional if your pain does not go away, if it gets worse, or if you have new or a different type of pain. You may develop tolerance to the medicine. Tolerance means that you will need a higher dose of the medicine for pain relief. Tolerance is normal and is expected if you take this medicine for a long time. Do not suddenly stop taking your medicine because you may develop a severe reaction. Your body becomes used to the medicine. This does NOT mean you are addicted. Addiction is a behavior related to getting and using a drug for a non-medical reason. If you have pain, you have a medical reason to take pain medicine. Your doctor will tell you how much medicine to take. If your doctor wants you to stop the medicine, the dose will be slowly lowered over  time to avoid any side effects. You may get drowsy or dizzy. Do not drive, use machinery, or do anything that needs mental alertness until you know how this medicine affects you. Do not stand or sit up quickly, especially if you are an older patient. This reduces the risk of dizzy or fainting spells. Alcohol can increase or decrease the effects  of this medicine. Avoid alcoholic drinks. You may have constipation. Try to have a bowel movement at least every 2 to 3 days. If you do not have a bowel movement for 3 days, call your doctor or health care professional. Your mouth may get dry. Chewing sugarless gum or sucking hard candy, and drinking plenty of water may help. Contact your doctor if the problem does not go away or is severe. What side effects may I notice from receiving this medicine? Side effects that you should report to your doctor or health care professional as soon as possible: -allergic reactions like skin rash, itching or hives, swelling of the face, lips, or tongue -breathing difficulties, wheezing -confusion -itching -light headedness or fainting spells -redness, blistering, peeling or loosening of the skin, including inside the mouth -seizures Side effects that usually do not require medical attention (report to your doctor or health care professional if they continue or are bothersome): -constipation -dizziness -drowsiness -headache -nausea, vomiting This list may not describe all possible side effects. Call your doctor for medical advice about side effects. You may report side effects to FDA at 1-800-FDA-1088. Where should I keep my medicine? Keep out of the reach of children. This medicine may cause accidental overdose and death if it taken by other adults, children, or pets. Mix any unused medicine with a substance like cat litter or coffee grounds. Then throw the medicine away in a sealed container like a sealed bag or a coffee can with a lid. Do not use the medicine after  the expiration date. Store at room temperature between 15 and 30 degrees C (59 and 86 degrees F). NOTE: This sheet is a summary. It may not cover all possible information. If you have questions about this medicine, talk to your doctor, pharmacist, or health care provider.    2016, Elsevier/Gold Standard. (2013-09-01 15:42:09)  Acetaminophen; Oxycodone tablets What is this medicine? ACETAMINOPHEN; OXYCODONE (a set a MEE noe fen; ox i KOE done) is a pain reliever. It is used to treat moderate to severe pain. This medicine may be used for other purposes; ask your health care provider or pharmacist if you have questions. What should I tell my health care provider before I take this medicine? They need to know if you have any of these conditions: -brain tumor -Crohn's disease, inflammatory bowel disease, or ulcerative colitis -drug abuse or addiction -head injury -heart or circulation problems -if you often drink alcohol -kidney disease or problems going to the bathroom -liver disease -lung disease, asthma, or breathing problems -an unusual or allergic reaction to acetaminophen, oxycodone, other opioid analgesics, other medicines, foods, dyes, or preservatives -pregnant or trying to get pregnant -breast-feeding How should I use this medicine? Take this medicine by mouth with a full glass of water. Follow the directions on the prescription label. You can take it with or without food. If it upsets your stomach, take it with food. Take your medicine at regular intervals. Do not take it more often than directed. Talk to your pediatrician regarding the use of this medicine in children. Special care may be needed. Patients over 13 years old may have a stronger reaction and need a smaller dose. Overdosage: If you think you have taken too much of this medicine contact a poison control center or emergency room at once. NOTE: This medicine is only for you. Do not share this medicine with others. What  if I miss a dose? If you miss a dose, take it as soon as  you can. If it is almost time for your next dose, take only that dose. Do not take double or extra doses. What may interact with this medicine? -alcohol -antihistamines -barbiturates like amobarbital, butalbital, butabarbital, methohexital, pentobarbital, phenobarbital, thiopental, and secobarbital -benztropine -drugs for bladder problems like solifenacin, trospium, oxybutynin, tolterodine, hyoscyamine, and methscopolamine -drugs for breathing problems like ipratropium and tiotropium -drugs for certain stomach or intestine problems like propantheline, homatropine methylbromide, glycopyrrolate, atropine, belladonna, and dicyclomine -general anesthetics like etomidate, ketamine, nitrous oxide, propofol, desflurane, enflurane, halothane, isoflurane, and sevoflurane -medicines for depression, anxiety, or psychotic disturbances -medicines for sleep -muscle relaxants -naltrexone -narcotic medicines (opiates) for pain -phenothiazines like perphenazine, thioridazine, chlorpromazine, mesoridazine, fluphenazine, prochlorperazine, promazine, and trifluoperazine -scopolamine -tramadol -trihexyphenidyl This list may not describe all possible interactions. Give your health care provider a list of all the medicines, herbs, non-prescription drugs, or dietary supplements you use. Also tell them if you smoke, drink alcohol, or use illegal drugs. Some items may interact with your medicine. What should I watch for while using this medicine? Tell your doctor or health care professional if your pain does not go away, if it gets worse, or if you have new or a different type of pain. You may develop tolerance to the medicine. Tolerance means that you will need a higher dose of the medication for pain relief. Tolerance is normal and is expected if you take this medicine for a long time. Do not suddenly stop taking your medicine because you may develop a severe  reaction. Your body becomes used to the medicine. This does NOT mean you are addicted. Addiction is a behavior related to getting and using a drug for a non-medical reason. If you have pain, you have a medical reason to take pain medicine. Your doctor will tell you how much medicine to take. If your doctor wants you to stop the medicine, the dose will be slowly lowered over time to avoid any side effects. You may get drowsy or dizzy. Do not drive, use machinery, or do anything that needs mental alertness until you know how this medicine affects you. Do not stand or sit up quickly, especially if you are an older patient. This reduces the risk of dizzy or fainting spells. Alcohol may interfere with the effect of this medicine. Avoid alcoholic drinks. There are different types of narcotic medicines (opiates) for pain. If you take more than one type at the same time, you may have more side effects. Give your health care provider a list of all medicines you use. Your doctor will tell you how much medicine to take. Do not take more medicine than directed. Call emergency for help if you have problems breathing. The medicine will cause constipation. Try to have a bowel movement at least every 2 to 3 days. If you do not have a bowel movement for 3 days, call your doctor or health care professional. Do not take Tylenol (acetaminophen) or medicines that have acetaminophen with this medicine. Too much acetaminophen can be very dangerous. Many nonprescription medicines contain acetaminophen. Always read the labels carefully to avoid taking more acetaminophen. What side effects may I notice from receiving this medicine? Side effects that you should report to your doctor or health care professional as soon as possible: -allergic reactions like skin rash, itching or hives, swelling of the face, lips, or tongue -breathing difficulties, wheezing -confusion -light headedness or fainting spells -severe stomach  pain -unusually weak or tired -yellowing of the skin or the whites of the eyes  Side effects that usually do not require medical attention (report to your doctor or health care professional if they continue or are bothersome): -dizziness -drowsiness -nausea -vomiting This list may not describe all possible side effects. Call your doctor for medical advice about side effects. You may report side effects to FDA at 1-800-FDA-1088. Where should I keep my medicine? Keep out of the reach of children. This medicine can be abused. Keep your medicine in a safe place to protect it from theft. Do not share this medicine with anyone. Selling or giving away this medicine is dangerous and against the law. This medicine may cause accidental overdose and death if it taken by other adults, children, or pets. Mix any unused medicine with a substance like cat litter or coffee grounds. Then throw the medicine away in a sealed container like a sealed bag or a coffee can with a lid. Do not use the medicine after the expiration date. Store at room temperature between 20 and 25 degrees C (68 and 77 degrees F). NOTE: This sheet is a summary. It may not cover all possible information. If you have questions about this medicine, talk to your doctor, pharmacist, or health care provider.    2016, Elsevier/[employer] Standard. (2014-06-06 15:18:46)

## 2016-01-22 ENCOUNTER — Telehealth: Payer: Self-pay | Admitting: *Deleted

## 2016-01-22 ENCOUNTER — Encounter: Payer: Self-pay | Admitting: Podiatry

## 2016-01-22 ENCOUNTER — Ambulatory Visit (INDEPENDENT_AMBULATORY_CARE_PROVIDER_SITE_OTHER): Payer: Medicare Other | Admitting: Podiatry

## 2016-01-22 VITALS — BP 120/40 | HR 63 | Resp 14

## 2016-01-22 DIAGNOSIS — L03032 Cellulitis of left toe: Secondary | ICD-10-CM | POA: Diagnosis not present

## 2016-01-22 DIAGNOSIS — S32020A Wedge compression fracture of second lumbar vertebra, initial encounter for closed fracture: Secondary | ICD-10-CM

## 2016-01-22 DIAGNOSIS — L600 Ingrowing nail: Secondary | ICD-10-CM

## 2016-01-22 NOTE — Telephone Encounter (Signed)
Daughter left msg on triage stating father fell and was seen in ER on 7/3. He has a compression fracture of the L2. Requesting referral for Kyphoplasty...Johny Chess

## 2016-01-22 NOTE — Progress Notes (Signed)
   Subjective:    Patient ID: Ricardo Perkins, male    DOB: 04/18/1923, 82 y.o.   MRN: [id number]  HPI this patient presents the office with chief complaint of pain in the inside border big toe of left foot says that it has been painful and ingrown for over a year now states that his been getting worse the last 2 weeks. He denies any evidence of redness, swelling or drainage from the painful area. He presents to the office today for an evaluation and treatment of this area  Review of Systems  All other systems reviewed and are negative.      Objective:   Physical Exam GENERAL APPEARANCE: Alert, conversant. Appropriately groomed. No acute distress.  VASCULAR: Pedal pulses are  Palpable but diminished  at  [facility] and PT bilateral.  Capillary refill time is immediate to all digits,  Normal temperature gradient.   NEUROLOGIC: sensation is normal to 5.07 monofilament at 5/5 sites bilateral.  Light touch is intact bilateral, Muscle strength normal.  MUSCULOSKELETAL: acceptable muscle strength, tone and stability bilateral.  Intrinsic muscluature intact bilateral.  Rectus appearance of foot and digits noted bilateral.   DERMATOLOGIC: skin color, texture, and turgor are within normal limits.  No preulcerative lesions or ulcers  are seen, no interdigital maceration noted.  No open lesions present.  . No drainage noted.  NAILS  Marked incurvation medial border left big toe .  Mild redness noted.  No granulation  tissue.  Thick disfigured discolored nail left hallux.         Assessment & Plan:  Onychomycosis Left hallux   Ingrown toenail left hallux toenail   IE  Nail surgery.  Treatment options and alternatives discussed.  Recommended an incision and drainage and patient agreed.  Left hallux was prepped with alcohol and a 3cc. of  2% lidocaine plain was administered in a digital block fashion.  The toe was then prepped with betadine solution .  The offending nail border was then excised and all necrotic  tissue was resected.  The area was then cleansed  and antibiotic ointment and a dry sterile dressing was applied.  The patient was dispensed instructions for aftercare. RTC 1 week.   Gardiner Barefoot DPM

## 2016-01-23 NOTE — Telephone Encounter (Signed)
Placed the referral to neurosurgery.

## 2016-01-23 NOTE — Telephone Encounter (Signed)
Notified daughter referral has been placed...Ricardo Perkins

## 2016-01-29 ENCOUNTER — Ambulatory Visit: Payer: Medicare Other | Admitting: Podiatry

## 2016-01-30 ENCOUNTER — Inpatient Hospital Stay: Admission: RE | Admit: 2016-01-30 | Payer: Medicare Other | Source: Ambulatory Visit

## 2016-02-05 DIAGNOSIS — Z6829 Body mass index (BMI) 29.0-29.9, adult: Secondary | ICD-10-CM | POA: Diagnosis not present

## 2016-02-05 DIAGNOSIS — S32020A Wedge compression fracture of second lumbar vertebra, initial encounter for closed fracture: Secondary | ICD-10-CM | POA: Diagnosis not present

## 2016-02-10 ENCOUNTER — Other Ambulatory Visit: Payer: Self-pay | Admitting: Internal Medicine

## 2016-02-14 ENCOUNTER — Other Ambulatory Visit: Payer: Self-pay | Admitting: Internal Medicine

## 2016-02-14 ENCOUNTER — Ambulatory Visit (INDEPENDENT_AMBULATORY_CARE_PROVIDER_SITE_OTHER): Payer: Medicare Other | Admitting: General Practice

## 2016-02-14 DIAGNOSIS — I4891 Unspecified atrial fibrillation: Secondary | ICD-10-CM

## 2016-02-14 LAB — POCT INR: INR: 2.5

## 2016-02-14 NOTE — Progress Notes (Signed)
I have reviewed and agree with the plan. 

## 2016-02-15 ENCOUNTER — Telehealth: Payer: Self-pay

## 2016-02-15 MED ORDER — PREDNISONE 10 MG PO TABS
10.0000 mg | ORAL_TABLET | Freq: Every day | ORAL | 3 refills | Status: DC
Start: 2016-02-15 — End: 2016-06-19

## 2016-02-15 NOTE — Telephone Encounter (Signed)
Received call from daughter Loletha Carrow) on 7/29 reporting she had called the office last week asking for prednisone refill.  There are no notes (that I can see in EPIC, ? If this was a call made to the pharmacy ??) of request.  She denies change in symptoms or complaints from patient in regards to patients breathing.  She relays her elderly father has a compression fracture and is chronically on prednisone.  His prednisone refill runs out today.  They have not been seen recently in the office due to pain issues associated with fractures.  Requesting refill of prednisone - she reports he takes it daily but is not sure how much.     Plan: Prednisone 10 mg PO QD, #30 with 3 refills Discussed pain control with daughter and that patient may need evaluation for pain control.  Currently, she feels they do not need to seek care Directed her to report to the ER if new or worsening symptoms.  She is also aware of CT scan that was recently ordered    Noe Gens, NP-C [facility] Pgr: [phone] or if no answer [phone] 02/15/2016, 12:34 PM

## 2016-02-15 NOTE — Telephone Encounter (Signed)
Spoke with pt's daughter Ricardo Perkins who states she had been trying to get in contact with the on call person for pulmonary but the phone system kept cutting her call off.  She states her father is not able to come in at this time for a visit because he is in excruciating pain and his compression fracture has gotten worse per Dr. Trenton Gammon (father's neurosurgeon).  Pt had to come to [facility] for a coumadin follow up and had a hard time getting in and out of the car for the visit.  [name] states pt was told to stay has still as possible for this time due to the fact that movement is making the compression fracture worse. Advised pt's daughter that I would find the on call pulmonary specailist for the day an see if they can be of any assistance in the matter.  [name] verbalized understanding.   Called [facility] and the on call doctor today is Dr. Titus Mould.  Requested a call back.  Dr. Titus Mould called and requested the contact information for pt's daughter.  It was given and he will call pt's daughter directly.

## 2016-02-15 NOTE — Telephone Encounter (Signed)
Ricardo Perkins [facility] into [facility] requesting a refill of pt's prednisone.  She states pt fell about 3 to 4 weeks ago and has a compression fracture at L2-L3.  Per Dr. Anitra Lauth pt should follow up with Dr. Melvyn Novas and if pt is in that much pain today then he should be evaluated at the ED.Marland Kitchen

## 2016-02-25 ENCOUNTER — Other Ambulatory Visit: Payer: Self-pay | Admitting: Internal Medicine

## 2016-02-26 ENCOUNTER — Telehealth: Payer: Self-pay | Admitting: *Deleted

## 2016-02-26 NOTE — Telephone Encounter (Signed)
Rec'd fax stating needing an alternative for Atenolol '25mg'$  due to a Psychologist, prison and probation services...Ricardo Perkins

## 2016-02-27 ENCOUNTER — Telehealth: Payer: Self-pay | Admitting: Emergency Medicine

## 2016-02-27 ENCOUNTER — Other Ambulatory Visit: Payer: Self-pay | Admitting: Geriatric Medicine

## 2016-02-27 ENCOUNTER — Telehealth: Payer: Self-pay | Admitting: Cardiology

## 2016-02-27 MED ORDER — METOPROLOL TARTRATE 25 MG PO TABS
12.5000 mg | ORAL_TABLET | Freq: Two times a day (BID) | ORAL | 0 refills | Status: DC
Start: 2016-02-27 — End: 2016-03-19

## 2016-02-27 NOTE — Telephone Encounter (Signed)
Vickie patient daughter called back requesting a return phone call at [phone]. States patient is already on amlodipine.  Needs something to replace atenolol.

## 2016-02-27 NOTE — Telephone Encounter (Signed)
Patient called regarding atenolol shortage.  He also called his PCP and looks as though they have already switched him to metoprolol tart 12.5 mg bid.

## 2016-02-27 NOTE — Telephone Encounter (Signed)
Sent in metoprolol, take 1/2 pill twice a day. Then can switch back to atenolol when it is in.

## 2016-02-27 NOTE — Telephone Encounter (Signed)
New message    Pt called stating that [facility] and other drug stores are out of Atenolol. He requests that the doctor call him in another medication. He stated that he need it called in today b/c he won't be available tomorrow. Please call.

## 2016-02-27 NOTE — Telephone Encounter (Signed)
Left message informing patient that amlodipine was sent in to replace atenolol.

## 2016-02-27 NOTE — Telephone Encounter (Signed)
Please see phone note from yesterday. This medication is on backorder and an alternative was sent in.

## 2016-02-27 NOTE — Telephone Encounter (Signed)
Pt needs a prescription refill on atenolol (TENORMIN) 25 MG tablet. He states the pharmacy cant get a refill of this medication. What does he need to do? Pharmacy is [facility] Please follow up thanks.

## 2016-02-27 NOTE — Telephone Encounter (Signed)
I did not see this on his med list, please advise, thanks.

## 2016-02-28 ENCOUNTER — Telehealth: Payer: Self-pay | Admitting: Geriatric Medicine

## 2016-02-28 NOTE — Telephone Encounter (Signed)
Please check prior phone note and medication list. As stated in other phone note regarding same topic metoprolol was sent in to replace his atenolol while on backorder.

## 2016-02-28 NOTE — Telephone Encounter (Signed)
Spoke to patient's daughter.

## 2016-02-28 NOTE — Telephone Encounter (Signed)
-----   Message from Earnstine Regal, [place] sent at 02/28/2016  9:12 AM EDT ----- This patient daughter call you back I transferred the VM to you, but she is requesting for you to give her a call

## 2016-02-28 NOTE — Telephone Encounter (Signed)
Patient's daughter says the patient was already on amlodipine and still needs a replacement for atenolol. Please advise, thanks.

## 2016-02-28 NOTE — Telephone Encounter (Signed)
Metoprolol has been sent in and I spoke with Vickie and informed her.

## 2016-03-11 DIAGNOSIS — S32020A Wedge compression fracture of second lumbar vertebra, initial encounter for closed fracture: Secondary | ICD-10-CM | POA: Diagnosis not present

## 2016-03-11 DIAGNOSIS — Z6828 Body mass index (BMI) 28.0-28.9, adult: Secondary | ICD-10-CM | POA: Diagnosis not present

## 2016-03-13 ENCOUNTER — Ambulatory Visit (INDEPENDENT_AMBULATORY_CARE_PROVIDER_SITE_OTHER): Payer: Medicare Other | Admitting: Internal Medicine

## 2016-03-13 ENCOUNTER — Encounter: Payer: Self-pay | Admitting: Internal Medicine

## 2016-03-13 ENCOUNTER — Ambulatory Visit (INDEPENDENT_AMBULATORY_CARE_PROVIDER_SITE_OTHER): Payer: Medicare Other | Admitting: General Practice

## 2016-03-13 DIAGNOSIS — H9313 Tinnitus, bilateral: Secondary | ICD-10-CM | POA: Diagnosis not present

## 2016-03-13 DIAGNOSIS — I4891 Unspecified atrial fibrillation: Secondary | ICD-10-CM

## 2016-03-13 DIAGNOSIS — H9319 Tinnitus, unspecified ear: Secondary | ICD-10-CM | POA: Insufficient documentation

## 2016-03-13 LAB — POCT INR: INR: 4.3

## 2016-03-13 NOTE — Assessment & Plan Note (Signed)
With sounds of music. Talked to him about noise cancellation and given information about this. He will adjust and try an ambient nose generator first.

## 2016-03-13 NOTE — Progress Notes (Signed)
Pre visit review using our clinic review tool, if applicable. No additional management support is needed unless otherwise documented below in the visit note. 

## 2016-03-13 NOTE — Progress Notes (Signed)
I have reviewed and agree with the plan. 

## 2016-03-13 NOTE — Progress Notes (Signed)
Medical treatment/procedure(s) were performed by non-physician practitioner and as supervising physician I was immediately available for consultation/collaboration. I agree with above. Elizabeth A Crawford, MD  

## 2016-03-13 NOTE — Patient Instructions (Signed)
Try a sound device to drown out the noise of the music. Unfortunately there is not a solution to the music in the ears.

## 2016-03-13 NOTE — Progress Notes (Signed)
   Subjective:    Patient ID: Ricardo Perkins, male    DOB: 08/29/1922, 82 y.o.   MRN: [id number]  HPI The patient is a 82 YO man coming in for sounds in his ear. He thinks it sounds like music. Going on about 3 months. Saw the hearing aid specialist and they told him to come ask [place] if there was an answer. Thought his neighbor was playing music but his daughter could not hear it and they were not. Usually between 2-6AM it is the worst and wakes him up.   Review of Systems  Constitutional: Negative.   HENT: Positive for hearing loss and tinnitus. Negative for ear discharge and ear pain.   Respiratory: Negative for cough, chest tightness and shortness of breath.   Cardiovascular: Negative for chest pain and leg swelling.  Gastrointestinal: Negative.   Skin: Negative.   Neurological: Negative.  Negative for dizziness, weakness and numbness.  Psychiatric/Behavioral: Negative.       Objective:   Physical Exam  Constitutional: He is oriented to person, place, and time. He appears well-developed and well-nourished.  HENT:  Head: Normocephalic and atraumatic.  Right ear with mild wax, not obstructive, TM normal. Left ear with minimal wax, TM normal  Eyes: EOM are normal.  Cardiovascular: Normal rate and regular rhythm.   Pulmonary/Chest: Effort normal and breath sounds normal. No respiratory distress. He exhibits no tenderness.  On oxygen  Abdominal: Soft. He exhibits no distension. There is no tenderness. There is no rebound.  Musculoskeletal: He exhibits no edema.  Neurological: He is alert and oriented to person, place, and time.   Vitals:   03/13/16 1325  BP: 140/60  Pulse: 63  Resp: 16  Temp: 98.3 F (36.8 C)  TempSrc: Oral  SpO2: 90%  Weight: 187 lb 6.4 oz (85 kg)  Height: '5\' 7"'$  (1.702 m)      Assessment & Plan:

## 2016-03-19 ENCOUNTER — Other Ambulatory Visit: Payer: Self-pay | Admitting: Internal Medicine

## 2016-03-20 ENCOUNTER — Other Ambulatory Visit: Payer: Self-pay | Admitting: Geriatric Medicine

## 2016-03-20 MED ORDER — METOPROLOL TARTRATE 25 MG PO TABS: 12.5000 mg | ORAL_TABLET | Freq: Two times a day (BID) | ORAL | 3 refills | Status: DC

## 2016-03-27 ENCOUNTER — Ambulatory Visit (INDEPENDENT_AMBULATORY_CARE_PROVIDER_SITE_OTHER): Payer: Medicare Other | Admitting: General Practice

## 2016-03-27 DIAGNOSIS — I4891 Unspecified atrial fibrillation: Secondary | ICD-10-CM

## 2016-03-27 LAB — POCT INR: INR: 3.9

## 2016-03-29 NOTE — Progress Notes (Signed)
I have reviewed and agree with the plan. 

## 2016-04-03 ENCOUNTER — Other Ambulatory Visit: Payer: Self-pay | Admitting: Internal Medicine

## 2016-04-06 NOTE — Telephone Encounter (Signed)
Sent to pharmacy 

## 2016-04-09 DIAGNOSIS — S32020A Wedge compression fracture of second lumbar vertebra, initial encounter for closed fracture: Secondary | ICD-10-CM | POA: Diagnosis not present

## 2016-04-17 ENCOUNTER — Ambulatory Visit (INDEPENDENT_AMBULATORY_CARE_PROVIDER_SITE_OTHER): Payer: Medicare Other | Admitting: General Practice

## 2016-04-17 DIAGNOSIS — I4891 Unspecified atrial fibrillation: Secondary | ICD-10-CM | POA: Diagnosis not present

## 2016-04-17 LAB — POCT INR: INR: 2.9

## 2016-04-17 NOTE — Progress Notes (Signed)
I have reviewed and agree with the plan. 

## 2016-04-21 ENCOUNTER — Telehealth: Payer: Self-pay | Admitting: Internal Medicine

## 2016-04-21 NOTE — Telephone Encounter (Signed)
LM for pt daughter to sched flu/pneumonia vaccine

## 2016-05-12 ENCOUNTER — Ambulatory Visit: Payer: Medicare Other | Admitting: Cardiology

## 2016-05-15 ENCOUNTER — Ambulatory Visit (INDEPENDENT_AMBULATORY_CARE_PROVIDER_SITE_OTHER): Payer: Medicare Other | Admitting: General Practice

## 2016-05-15 DIAGNOSIS — Z23 Encounter for immunization: Secondary | ICD-10-CM | POA: Diagnosis not present

## 2016-05-15 DIAGNOSIS — I4891 Unspecified atrial fibrillation: Secondary | ICD-10-CM | POA: Diagnosis not present

## 2016-05-15 LAB — POCT INR: INR: 2.8

## 2016-05-15 NOTE — Progress Notes (Signed)
I have reviewed and agree with the plan. 

## 2016-05-15 NOTE — Patient Instructions (Signed)
Pre visit review using our clinic review tool, if applicable. No additional management support is needed unless otherwise documented below in the visit note. 

## 2016-05-24 ENCOUNTER — Other Ambulatory Visit: Payer: Self-pay | Admitting: Internal Medicine

## 2016-06-05 ENCOUNTER — Other Ambulatory Visit: Payer: Self-pay | Admitting: Internal Medicine

## 2016-06-05 ENCOUNTER — Other Ambulatory Visit: Payer: Self-pay | Admitting: Pulmonary Disease

## 2016-06-15 ENCOUNTER — Encounter: Payer: Self-pay | Admitting: Cardiology

## 2016-06-17 NOTE — Progress Notes (Deleted)
HPI: FU atrial fibrillation. Myoview 3/13 was low risk with an EF of 62% with small reversible defect in the apex suggestive of very mild apical ischemia. Echocardiogram 1/17 showed EF 40-45, mild AI, mild LAE and mildly elevated pulmonary pressure. Chest CT 1/17 showed TAA 4.1 cm. Abd CT 3/17 showed pulmonary fibrosis, AAA 3.6 x 3.7 cm. Had GI bleed 3/17 felt likely diverticular; coumadin transiently held. Since last seen,   Current Outpatient Prescriptions  Medication Sig Dispense Refill  . amLODipine (NORVASC) 10 MG tablet TAKE 1 TABLET BY MOUTH EVERY DAY 90 tablet 0  . finasteride (PROSCAR) 5 MG tablet Take 5 mg by mouth daily.  1  . lidocaine (LIDODERM) 5 % Place 1 patch onto the skin daily. Remove & Discard patch within 12 hours or as directed by MD (Patient taking differently: Place 1 patch onto the skin daily as needed (pain). Remove & Discard patch within 12 hours or as directed by MD) 60 patch 6  . metoprolol tartrate (LOPRESSOR) 25 MG tablet Take 0.5 tablets (12.5 mg total) by mouth 2 (two) times daily. 90 tablet 3  . metoprolol tartrate (LOPRESSOR) 25 MG tablet TAKE 1/2 TABLET(12.5 MG) BY MOUTH TWICE DAILY 90 tablet 0  . omeprazole (PRILOSEC) 20 MG capsule TAKE ONE CAPSULE BY MOUTH EVERY DAY AS NEEDED (Patient not taking: Reported on 03/13/2016) 90 capsule 1  . oxyCODONE-acetaminophen (PERCOCET) 5-325 MG tablet Take 1 tablet by mouth every 4 (four) hours as needed for moderate pain. (Patient not taking: Reported on 03/13/2016) 10 tablet 0  . predniSONE (DELTASONE) 10 MG tablet Take 1 tablet (10 mg total) by mouth daily with breakfast. 30 tablet 3  . tamsulosin (FLOMAX) 0.4 MG CAPS capsule Take 1 capsule (0.4 mg total) by mouth daily after supper. --- Pt needs appt for further refills 90 capsule 0  . tamsulosin (FLOMAX) 0.4 MG CAPS capsule TAKE 1 CAPSULE BY MOUTH DAILY AFTER SUPPER 90 capsule 0  . temazepam (RESTORIL) 30 MG capsule TAKE ONE CAPSULE BY MOUTH EVERY NIGHT AT BEDTIME 90  capsule 0  . traMADol (ULTRAM) 50 MG tablet Take 1 tablet (50 mg total) by mouth every 6 (six) hours as needed. 30 tablet 0  . warfarin (COUMADIN) 3 MG tablet AS DIRECTED (Patient taking differently: Take 1.5-3 mg by mouth daily. Take 1.'5mg'$  on Sunday, Tuesday, Thursday and Saturday, then take '3mg'$  on Monday, Wednesday and Fridays.Marland Kitchen)     No current facility-administered medications for this visit.      Past Medical History:  Diagnosis Date  . Advanced age   . Arthritis    knees, shoulders   . ASTEATOTIC ECZEMA   . BENIGN PROSTATIC HYPERTROPHY, WITH OBSTRUCTION   . BRADYCARDIA    1st degree AVB  . COPD (chronic obstructive pulmonary disease) (HCC)    on chronic oxygen therapy qhs>daytime  . DECREASED HEARING   . Hx of cardiovascular stress test    a.  MV 4/09:  possible small area of lateral ischemia at the apex, but Dr. Kirk Ruths reviewed this, and in fact, felt it was most likely normal.  b.  Myoview 3/13 was again low risk with an EF of 62% with small reversible defect in the apex suggestive of very mild apical ischemia.  Marland Kitchen Hx of echocardiogram    a. echo in 2009 (in [place]): normal EF, mild LVH;   b. echo 10/13:  mid Ant HK, mod LVH, EF 55%, trivial AI, mod LAE, mild RVE, mild reduced RVSF,  PASP 46  . HYPERTENSION   . INSOMNIA, PERSISTENT   . Large bilateral inguinal hernias - chronic, containing small bowel (right) and sigmoid colon (left) 01/15/2011   s/p B repair  . LEG EDEMA, BILATERAL   . OA (osteoarthritis) of knee    S/p right TKR March '13 (Dr. Wynelle Link)   . PAROXYSMAL ATRIAL FIBRILLATION    coumadin rx  . RENAL INSUFFICIENCY   . RHINITIS, VASOMOTOR 01/07/2010    Past Surgical History:  Procedure Laterality Date  . APPENDECTOMY     @ 23  . EYE SURGERY     bilateral cataract surgery   . HERNIA REPAIR  01/2011   bilateral inguinal hernia   . KNEE SURGERY  right  . OTHER SURGICAL HISTORY     left finger surgery   . TONSILLECTOMY    . TOTAL KNEE ARTHROPLASTY   10/26/2011   Procedure: TOTAL KNEE ARTHROPLASTY;  Surgeon: Gearlean Alf, MD;  Location: [facility];  Service: Orthopedics;  Laterality: Right;    Social History   Social History  . Marital status: Single    Spouse name: N/A  . Number of children: N/A  . Years of education: N/A   Occupational History  . Not on file.   Social History Main Topics  . Smoking status: Former Smoker    Packs/day: 1.00    Years: 30.00    Types: Cigarettes    Quit date: 07/20/1998  . Smokeless tobacco: Never Used  . Alcohol use 0.0 oz/week     Comment: wine occasional   . Drug use: No  . Sexual activity: Not Currently   Other Topics Concern  . 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 Heart disease Father   . Heart attack Father   . Cancer Mother     colon   . Cirrhosis Sister     liver failure  . Heart attack Brother   . Diabetes Neg Hx     ROS: no fevers or chills, productive cough, hemoptysis, dysphasia, odynophagia, melena, hematochezia, dysuria, hematuria, rash, seizure activity, orthopnea, PND, pedal edema, claudication. Remaining systems are negative.  Physical Exam: Well-developed well-nourished in no acute distress.  Skin is warm and dry.  HEENT is normal.  Neck is supple.  Chest is clear to auscultation with normal expansion.  Cardiovascular exam is regular rate and rhythm.  Abdominal exam nontender or distended. No masses palpated. Extremities show no edema. neuro grossly intact  ECG

## 2016-06-19 ENCOUNTER — Ambulatory Visit (INDEPENDENT_AMBULATORY_CARE_PROVIDER_SITE_OTHER): Payer: Medicare Other | Admitting: General Practice

## 2016-06-19 ENCOUNTER — Ambulatory Visit (INDEPENDENT_AMBULATORY_CARE_PROVIDER_SITE_OTHER): Payer: Medicare Other | Admitting: Internal Medicine

## 2016-06-19 ENCOUNTER — Encounter: Payer: Self-pay | Admitting: Internal Medicine

## 2016-06-19 DIAGNOSIS — I4891 Unspecified atrial fibrillation: Secondary | ICD-10-CM

## 2016-06-19 DIAGNOSIS — J8410 Pulmonary fibrosis, unspecified: Secondary | ICD-10-CM

## 2016-06-19 LAB — POCT INR: INR: 1.9

## 2016-06-19 MED ORDER — PREDNISONE 10 MG PO TABS: 10.0000 mg | ORAL_TABLET | Freq: Every day | ORAL | 0 refills | Status: DC

## 2016-06-19 NOTE — Progress Notes (Signed)
   Subjective:    Patient ID: Ricardo Perkins, male    DOB: 1923/06/01, 82 y.o.   MRN: [id number]  HPI The patient is a 82 YO man coming in for concerns about inability to get his medications. He was denied refill for prednisone recently and he had to borrow some. This is for his arthritis and his breathing. Previously his pulmonologist was prescribing but they have not seen him in > 1 year. They did not know that they should have scheduled with pulmonary. They are also having a hard time getting in to the office for INR checks and want to discuss other options.   Review of Systems  Constitutional: Positive for activity change and fatigue. Negative for appetite change, chills, fever and unexpected weight change.  Respiratory: Positive for shortness of breath. Negative for cough, chest tightness and wheezing.   Cardiovascular: Negative.   Musculoskeletal: Positive for arthralgias, back pain and gait problem. Negative for myalgias.  Skin: Negative.   Hematological: Bruises/bleeds easily.      Objective:   Physical Exam  Constitutional: He appears well-developed and well-nourished.  Uncomfortable  HENT:  Head: Normocephalic and atraumatic.  Cardiovascular: Normal rate.   Pulmonary/Chest: Effort normal and breath sounds normal.  4L O2 continuous  Abdominal: Soft.  Neurological: Coordination abnormal.  Sow and hesitating gait  Skin: Skin is warm and dry.   Vitals:   06/19/16 1115  BP: 130/70  Pulse: (!) 55  Resp: 18  Temp: 97.6 F (36.4 C)  TempSrc: Oral  SpO2: 95%  Weight: 191 lb 6.4 oz (86.8 kg)  Height: '5\' 7"'$  (1.702 m)  PF: (!) 4 L/min      Assessment & Plan:

## 2016-06-19 NOTE — Patient Instructions (Signed)
We will arrange to get the home health to do the INR in your house and call here for dosage changes.   We have sent in 3 month supply of the prednisone but we would like you to see the lung doctor every year to follow that.

## 2016-06-19 NOTE — Patient Instructions (Signed)
Pre visit review using our clinic review tool, if applicable. No additional management support is needed unless otherwise documented below in the visit note. 

## 2016-06-19 NOTE — Progress Notes (Signed)
Pre visit review using our clinic review tool, if applicable. No additional management support is needed unless otherwise documented below in the visit note. 

## 2016-06-19 NOTE — Progress Notes (Signed)
I have reviewed and agree with the plan. 

## 2016-06-19 NOTE — Assessment & Plan Note (Signed)
Will fill prednisone today but have asked them to return to pulmonary for further treatment.

## 2016-06-19 NOTE — Assessment & Plan Note (Signed)
Feels unable to get to coumadin clinic any longer. His daughter is insistent that he be on coumadin due to hx of falls and bleeding in the past she wants reversibility. Having them talk with our coumadin clinic about either home health or home device that wirelessly sends INR data to our office to adjust as needed.

## 2016-06-22 ENCOUNTER — Ambulatory Visit: Payer: Medicare Other | Admitting: Cardiology

## 2016-07-02 ENCOUNTER — Other Ambulatory Visit: Payer: Self-pay | Admitting: Internal Medicine

## 2016-07-03 ENCOUNTER — Other Ambulatory Visit: Payer: Self-pay | Admitting: General Practice

## 2016-07-03 MED ORDER — WARFARIN SODIUM 3 MG PO TABS: ORAL_TABLET | ORAL | 1 refills | Status: DC

## 2016-07-03 NOTE — Telephone Encounter (Signed)
Please advise on the warfarin.

## 2016-07-15 DIAGNOSIS — I480 Paroxysmal atrial fibrillation: Secondary | ICD-10-CM | POA: Diagnosis not present

## 2016-07-15 LAB — POCT INR: INR: 2.2

## 2016-07-16 ENCOUNTER — Ambulatory Visit (INDEPENDENT_AMBULATORY_CARE_PROVIDER_SITE_OTHER): Payer: Medicare Other | Admitting: General Practice

## 2016-07-16 DIAGNOSIS — I4891 Unspecified atrial fibrillation: Secondary | ICD-10-CM

## 2016-07-16 DIAGNOSIS — Z5181 Encounter for therapeutic drug level monitoring: Secondary | ICD-10-CM

## 2016-07-16 NOTE — Patient Instructions (Signed)
Pre visit review using our clinic review tool, if applicable. No additional management support is needed unless otherwise documented below in the visit note. 

## 2016-07-22 NOTE — Progress Notes (Signed)
HPI: FU atrial fibrillation. Myoview 3/13 was low risk with an EF of 62% with small reversible defect in the apex suggestive of very mild apical ischemia. Echocardiogram 1/17 showed EF 40-45, mild AI, mild LAE and mildly elevated pulmonary pressure. Chest CT 1/17 showed TAA 4.1 cm. Abd CT 3/17 showed pulmonary fibrosis, AAA 3.6 x 3.7 cm. Had GI bleed 3/17 felt likely diverticular; coumadin transiently held. Since last seen, he notes some dyspnea on exertion but no orthopnea, PND, pedal edema, chest pain or syncope.  Current Outpatient Prescriptions  Medication Sig Dispense Refill  . amLODipine (NORVASC) 10 MG tablet TAKE 1 TABLET BY MOUTH EVERY DAY 90 tablet 0  . finasteride (PROSCAR) 5 MG tablet Take 5 mg by mouth daily.  1  . finasteride (PROSCAR) 5 MG tablet TAKE 1 TABLET(5 MG) BY MOUTH DAILY 90 tablet 0  . lidocaine (LIDODERM) 5 % Place 1 patch onto the skin daily. Remove & Discard patch within 12 hours or as directed by MD (Patient taking differently: Place 1 patch onto the skin daily as needed (pain). Remove & Discard patch within 12 hours or as directed by MD) 60 patch 6  . metoprolol tartrate (LOPRESSOR) 25 MG tablet TAKE 1/2 TABLET(12.5 MG) BY MOUTH TWICE DAILY 90 tablet 0  . omeprazole (PRILOSEC) 20 MG capsule TAKE ONE CAPSULE BY MOUTH EVERY DAY AS NEEDED 90 capsule 1  . oxyCODONE-acetaminophen (PERCOCET) 5-325 MG tablet Take 1 tablet by mouth every 4 (four) hours as needed for moderate pain. 10 tablet 0  . predniSONE (DELTASONE) 10 MG tablet TAKE 1 TABLET(10 MG) BY MOUTH DAILY WITH BREAKFAST 90 tablet 0  . tamsulosin (FLOMAX) 0.4 MG CAPS capsule Take 1 capsule (0.4 mg total) by mouth daily after supper. --- Pt needs appt for further refills 90 capsule 0  . tamsulosin (FLOMAX) 0.4 MG CAPS capsule TAKE 1 CAPSULE BY MOUTH DAILY AFTER SUPPER 90 capsule 0  . temazepam (RESTORIL) 30 MG capsule TAKE 1 CAPSULE BY MOUTH EVERY DAY AT BEDTIME 90 capsule 0  . traMADol (ULTRAM) 50 MG tablet  Take 1 tablet (50 mg total) by mouth every 6 (six) hours as needed. 30 tablet 0  . warfarin (COUMADIN) 3 MG tablet AS DIRECTED 90 tablet 1   No current facility-administered medications for this visit.      Past Medical History:  Diagnosis Date  . Advanced age   . Arthritis    knees, shoulders   . ASTEATOTIC ECZEMA   . BENIGN PROSTATIC HYPERTROPHY, WITH OBSTRUCTION   . BRADYCARDIA    1st degree AVB  . COPD (chronic obstructive pulmonary disease) (HCC)    on chronic oxygen therapy qhs>daytime  . DECREASED HEARING   . Hx of cardiovascular stress test    a.  MV 4/09:  possible small area of lateral ischemia at the apex, but Dr. Kirk Ruths reviewed this, and in fact, felt it was most likely normal.  b.  Myoview 3/13 was again low risk with an EF of 62% with small reversible defect in the apex suggestive of very mild apical ischemia.  Marland Kitchen Hx of echocardiogram    a. echo in 2009 (in [place]): normal EF, mild LVH;   b. echo 10/13:  mid Ant HK, mod LVH, EF 55%, trivial AI, mod LAE, mild RVE, mild reduced RVSF, PASP 46  . HYPERTENSION   . INSOMNIA, PERSISTENT   . Large bilateral inguinal hernias - chronic, containing small bowel (right) and sigmoid colon (left) 01/15/2011  s/p B repair  . LEG EDEMA, BILATERAL   . OA (osteoarthritis) of knee    S/p right TKR March '13 (Dr. Wynelle Link)   . PAROXYSMAL ATRIAL FIBRILLATION    coumadin rx  . RENAL INSUFFICIENCY   . RHINITIS, VASOMOTOR 01/07/2010    Past Surgical History:  Procedure Laterality Date  . APPENDECTOMY     @ 23  . EYE SURGERY     bilateral cataract surgery   . HERNIA REPAIR  01/2011   bilateral inguinal hernia   . KNEE SURGERY  right  . OTHER SURGICAL HISTORY     left finger surgery   . TONSILLECTOMY    . TOTAL KNEE ARTHROPLASTY  10/26/2011   Procedure: TOTAL KNEE ARTHROPLASTY;  Surgeon: Gearlean Alf, MD;  Location: [facility];  Service: Orthopedics;  Laterality: Right;    Social History   Social History  . Marital  status: Single    Spouse name: N/A  . Number of children: N/A  . Years of education: N/A   Occupational History  . Not on file.   Social History Main Topics  . Smoking status: Former Smoker    Packs/day: 1.00    Years: 30.00    Types: Cigarettes    Quit date: 07/20/1998  . Smokeless tobacco: Never Used  . Alcohol use 0.0 oz/week     Comment: wine occasional   . Drug use: No  . Sexual activity: Not Currently   Other Topics Concern  . 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 Heart disease Father   . Heart attack Father   . Cancer Mother     colon   . Cirrhosis Sister     liver failure  . Heart attack Brother   . Diabetes Neg Hx     ROS: Back pain from fall last July but no fevers or chills, productive cough, hemoptysis, dysphasia, odynophagia, melena, hematochezia, dysuria, hematuria, rash, seizure activity, orthopnea, PND, pedal edema, claudication. Remaining systems are negative.  Physical Exam: Well-developed chronically ill appearing in no acute distress.  Skin is warm and dry.  HEENT is normal.  Neck is supple.  Chest diminished BS Cardiovascular exam is regular rate and rhythm.  Abdominal exam nontender or distended. No masses palpated. Extremities show no edema. neuro grossly intact  ECG-Sinus rhythm at a rate of 65. Anterior infarct. Inferior infarct. First-degree AV block. A/P  1 Paroxysmal atrial fibrillation-patient remains in sinus rhythm. Continue Coumadin. Continue beta blocker. Check Hgb.   2 hypertension-blood pressure controlled. Continue present medications.  3 cardiomyopathy-LV function mild to moderately reduced. He is not having symptoms. Continue beta blocker. We have elected medical management given his age.  4  abdominal aortic aneurysm-discussed follow-up today with patient. I do not think he would be a candidate for surgical repair and he agrees. We will not pursue follow-up studies.  Kirk Ruths, MD

## 2016-07-23 ENCOUNTER — Other Ambulatory Visit: Payer: Self-pay | Admitting: Internal Medicine

## 2016-07-24 ENCOUNTER — Ambulatory Visit: Payer: Medicare Other

## 2016-07-28 ENCOUNTER — Encounter: Payer: Self-pay | Admitting: Cardiology

## 2016-07-28 ENCOUNTER — Ambulatory Visit (INDEPENDENT_AMBULATORY_CARE_PROVIDER_SITE_OTHER): Payer: Medicare Other | Admitting: Cardiology

## 2016-07-28 VITALS — BP 140/72 | HR 65 | Ht 67.0 in | Wt 191.0 lb

## 2016-07-28 DIAGNOSIS — I714 Abdominal aortic aneurysm, without rupture, unspecified: Secondary | ICD-10-CM

## 2016-07-28 DIAGNOSIS — I4891 Unspecified atrial fibrillation: Secondary | ICD-10-CM | POA: Diagnosis not present

## 2016-07-28 DIAGNOSIS — I10 Essential (primary) hypertension: Secondary | ICD-10-CM | POA: Diagnosis not present

## 2016-07-28 NOTE — Patient Instructions (Signed)
Medication Instructions:   NO CHANGE  Labwork:  Your physician recommends that you return for lab work NEXT WEEK  Follow-Up:  Your physician wants you to follow-up in: [place] will receive a reminder letter in the mail two months in advance. If you don't receive a letter, please call our office to schedule the follow-up appointment.   If you need a refill on your cardiac medications before your next appointment, please call your pharmacy.

## 2016-07-31 ENCOUNTER — Ambulatory Visit (INDEPENDENT_AMBULATORY_CARE_PROVIDER_SITE_OTHER): Payer: Medicare Other | Admitting: General Practice

## 2016-07-31 DIAGNOSIS — I4891 Unspecified atrial fibrillation: Secondary | ICD-10-CM

## 2016-07-31 DIAGNOSIS — Z5181 Encounter for therapeutic drug level monitoring: Secondary | ICD-10-CM

## 2016-07-31 LAB — POCT INR: INR: 2

## 2016-07-31 NOTE — Patient Instructions (Signed)
Pre visit review using our clinic review tool, if applicable. No additional management support is needed unless otherwise documented below in the visit note. 

## 2016-07-31 NOTE — Progress Notes (Signed)
I have reviewed and agree with the plan. 

## 2016-08-10 ENCOUNTER — Other Ambulatory Visit: Payer: Self-pay | Admitting: Nurse Practitioner

## 2016-08-10 DIAGNOSIS — Z20828 Contact with and (suspected) exposure to other viral communicable diseases: Secondary | ICD-10-CM

## 2016-08-10 MED ORDER — OSELTAMIVIR PHOSPHATE 75 MG PO CAPS: 75.0000 mg | ORAL_CAPSULE | Freq: Every day | ORAL | 0 refills | Status: DC

## 2016-08-10 NOTE — Progress Notes (Signed)
Son in law tested positive for influenza today. He lives at home with daughter and son in law Kathrynn Speed).

## 2016-08-12 ENCOUNTER — Telehealth: Payer: Self-pay | Admitting: Internal Medicine

## 2016-08-12 NOTE — Telephone Encounter (Signed)
Daughter got script for tamaflu for patient last week.  He now is showing symptoms of flu.  [employer] called stating daughter is wanting ok to give patient Promethazine syrup.

## 2016-08-12 NOTE — Telephone Encounter (Signed)
Ok to use promethazine syrup.  fo tamiflu, he will need to take medication with food, 1tab BID x 5days.

## 2016-08-12 NOTE — Telephone Encounter (Signed)
Daughter called back wanting to know if Ricardo Perkins can respond to this since Sharlet Salina is out.  Daughter is concerned about patient taking this with BP meds.

## 2016-08-12 NOTE — Telephone Encounter (Signed)
Notified patients daughter

## 2016-08-13 ENCOUNTER — Ambulatory Visit (INDEPENDENT_AMBULATORY_CARE_PROVIDER_SITE_OTHER): Payer: Medicare Other | Admitting: General Practice

## 2016-08-13 DIAGNOSIS — I4891 Unspecified atrial fibrillation: Secondary | ICD-10-CM

## 2016-08-13 DIAGNOSIS — Z5181 Encounter for therapeutic drug level monitoring: Secondary | ICD-10-CM

## 2016-08-13 LAB — POCT INR: INR: 2.2

## 2016-08-13 NOTE — Patient Instructions (Signed)
Pre visit review using our clinic review tool, if applicable. No additional management support is needed unless otherwise documented below in the visit note. 

## 2016-08-19 ENCOUNTER — Encounter: Payer: Self-pay | Admitting: *Deleted

## 2016-08-20 DIAGNOSIS — I4891 Unspecified atrial fibrillation: Secondary | ICD-10-CM | POA: Diagnosis not present

## 2016-08-20 LAB — CBC
HCT: 41.7 % (ref 38.5–50.0)
HEMOGLOBIN: 13.7 g/dL (ref 13.2–17.1)
MCH: 29.2 pg (ref 27.0–33.0)
MCHC: 32.9 g/dL (ref 32.0–36.0)
MCV: 88.9 fL (ref 80.0–100.0)
MPV: 10.2 fL (ref 7.5–12.5)
PLATELETS: 191 10*3/uL (ref 140–400)
RBC: 4.69 MIL/uL (ref 4.20–5.80)
RDW: 16.3 % — AB (ref 11.0–15.0)
WBC: 5.5 10*3/uL (ref 3.8–10.8)

## 2016-08-27 LAB — POCT INR: INR: 2.6

## 2016-08-28 ENCOUNTER — Ambulatory Visit (INDEPENDENT_AMBULATORY_CARE_PROVIDER_SITE_OTHER): Payer: Medicare Other | Admitting: General Practice

## 2016-08-28 DIAGNOSIS — Z5181 Encounter for therapeutic drug level monitoring: Secondary | ICD-10-CM

## 2016-08-28 NOTE — Progress Notes (Signed)
I have reviewed and agree with the plan. 

## 2016-08-28 NOTE — Patient Instructions (Signed)
Pre visit review using our clinic review tool, if applicable. No additional management support is needed unless otherwise documented below in the visit note. 

## 2016-09-09 DIAGNOSIS — I480 Paroxysmal atrial fibrillation: Secondary | ICD-10-CM | POA: Diagnosis not present

## 2016-09-09 LAB — POCT INR: INR: 2.1

## 2016-09-17 ENCOUNTER — Ambulatory Visit (INDEPENDENT_AMBULATORY_CARE_PROVIDER_SITE_OTHER): Payer: Medicare Other | Admitting: General Practice

## 2016-09-17 NOTE — Progress Notes (Signed)
I have reviewed and agree with the plan. 

## 2016-09-17 NOTE — Patient Instructions (Signed)
Pre visit review using our clinic review tool, if applicable. No additional management support is needed unless otherwise documented below in the visit note. 

## 2016-09-25 ENCOUNTER — Ambulatory Visit (INDEPENDENT_AMBULATORY_CARE_PROVIDER_SITE_OTHER): Payer: Medicare Other | Admitting: General Practice

## 2016-09-25 DIAGNOSIS — I4891 Unspecified atrial fibrillation: Secondary | ICD-10-CM

## 2016-09-25 DIAGNOSIS — Z5181 Encounter for therapeutic drug level monitoring: Secondary | ICD-10-CM

## 2016-09-25 LAB — POCT INR: INR: 1.9

## 2016-09-25 NOTE — Progress Notes (Signed)
I have reviewed and agree with the plan. 

## 2016-09-25 NOTE — Patient Instructions (Signed)
Pre visit review using our clinic review tool, if applicable. No additional management support is needed unless otherwise documented below in the visit note. 

## 2016-10-08 ENCOUNTER — Ambulatory Visit (INDEPENDENT_AMBULATORY_CARE_PROVIDER_SITE_OTHER): Payer: Medicare Other | Admitting: General Practice

## 2016-10-08 DIAGNOSIS — Z5181 Encounter for therapeutic drug level monitoring: Secondary | ICD-10-CM

## 2016-10-08 DIAGNOSIS — I4891 Unspecified atrial fibrillation: Secondary | ICD-10-CM

## 2016-10-08 LAB — POCT INR: INR: 2.6

## 2016-10-08 NOTE — Patient Instructions (Signed)
Pre visit review using our clinic review tool, if applicable. No additional management support is needed unless otherwise documented below in the visit note. 

## 2016-10-08 NOTE — Progress Notes (Signed)
I have reviewed and agree with the plan. 

## 2016-10-29 ENCOUNTER — Ambulatory Visit (INDEPENDENT_AMBULATORY_CARE_PROVIDER_SITE_OTHER): Payer: Medicare Other | Admitting: General Practice

## 2016-10-29 DIAGNOSIS — Z5181 Encounter for therapeutic drug level monitoring: Secondary | ICD-10-CM

## 2016-10-29 DIAGNOSIS — I4891 Unspecified atrial fibrillation: Secondary | ICD-10-CM

## 2016-10-29 LAB — POCT INR: INR: 2.3

## 2016-10-29 NOTE — Patient Instructions (Signed)
Pre visit review using our clinic review tool, if applicable. No additional management support is needed unless otherwise documented below in the visit note. 

## 2016-10-29 NOTE — Progress Notes (Signed)
I have reviewed and agree with the plan. 

## 2016-11-11 DIAGNOSIS — I480 Paroxysmal atrial fibrillation: Secondary | ICD-10-CM | POA: Diagnosis not present

## 2016-11-16 ENCOUNTER — Other Ambulatory Visit: Payer: Self-pay | Admitting: Internal Medicine

## 2016-11-28 ENCOUNTER — Other Ambulatory Visit: Payer: Self-pay | Admitting: Internal Medicine

## 2016-12-10 ENCOUNTER — Ambulatory Visit (INDEPENDENT_AMBULATORY_CARE_PROVIDER_SITE_OTHER): Payer: Medicare Other | Admitting: General Practice

## 2016-12-10 DIAGNOSIS — I4891 Unspecified atrial fibrillation: Secondary | ICD-10-CM

## 2016-12-10 DIAGNOSIS — Z5181 Encounter for therapeutic drug level monitoring: Secondary | ICD-10-CM

## 2016-12-10 LAB — POCT INR: INR: 2.8

## 2016-12-10 NOTE — Patient Instructions (Signed)
Pre visit review using our clinic review tool, if applicable. No additional management support is needed unless otherwise documented below in the visit note. 

## 2016-12-10 NOTE — Progress Notes (Signed)
I have reviewed and agree with the plan. 

## 2016-12-15 ENCOUNTER — Other Ambulatory Visit: Payer: Self-pay | Admitting: Internal Medicine

## 2016-12-21 ENCOUNTER — Other Ambulatory Visit: Payer: Self-pay | Admitting: Internal Medicine

## 2016-12-23 LAB — POCT INR: INR: 2.6

## 2016-12-24 ENCOUNTER — Ambulatory Visit (INDEPENDENT_AMBULATORY_CARE_PROVIDER_SITE_OTHER): Payer: Medicare Other | Admitting: General Practice

## 2016-12-24 DIAGNOSIS — Z5181 Encounter for therapeutic drug level monitoring: Secondary | ICD-10-CM | POA: Diagnosis not present

## 2016-12-24 DIAGNOSIS — I4891 Unspecified atrial fibrillation: Secondary | ICD-10-CM

## 2016-12-24 NOTE — Progress Notes (Signed)
I have reviewed and agree with the plan. 

## 2017-01-06 DIAGNOSIS — I480 Paroxysmal atrial fibrillation: Secondary | ICD-10-CM | POA: Diagnosis not present

## 2017-01-07 ENCOUNTER — Ambulatory Visit (INDEPENDENT_AMBULATORY_CARE_PROVIDER_SITE_OTHER): Payer: Medicare Other | Admitting: General Practice

## 2017-01-07 DIAGNOSIS — Z5181 Encounter for therapeutic drug level monitoring: Secondary | ICD-10-CM | POA: Diagnosis not present

## 2017-01-07 DIAGNOSIS — I4891 Unspecified atrial fibrillation: Secondary | ICD-10-CM

## 2017-01-07 LAB — POCT INR: INR: 1.6

## 2017-01-07 NOTE — Progress Notes (Signed)
I have reviewed and agree with the plan. 

## 2017-01-07 NOTE — Patient Instructions (Signed)
Pre visit review using our clinic review tool, if applicable. No additional management support is needed unless otherwise documented below in the visit note. 

## 2017-01-17 ENCOUNTER — Other Ambulatory Visit: Payer: Self-pay | Admitting: Internal Medicine

## 2017-01-18 ENCOUNTER — Telehealth: Payer: Self-pay | Admitting: Internal Medicine

## 2017-01-18 NOTE — Telephone Encounter (Signed)
Pt would like a refill of temazepam (RESTORIL) 30 MG capsule

## 2017-01-18 NOTE — Telephone Encounter (Signed)
Working on it, it is a controlled medication so I have to send it to a provider, if patient calls again please remind him of the 09-32 hour refill policy

## 2017-01-18 NOTE — Telephone Encounter (Signed)
Last dispensed 10/23/2016 90 for 90 days, 0 refills

## 2017-01-19 NOTE — Telephone Encounter (Signed)
Notified pt rx faxed script back to [facility]...Ricardo Perkins

## 2017-01-21 ENCOUNTER — Ambulatory Visit (INDEPENDENT_AMBULATORY_CARE_PROVIDER_SITE_OTHER): Payer: Medicare Other | Admitting: General Practice

## 2017-01-21 DIAGNOSIS — I4891 Unspecified atrial fibrillation: Secondary | ICD-10-CM

## 2017-01-21 DIAGNOSIS — Z5181 Encounter for therapeutic drug level monitoring: Secondary | ICD-10-CM | POA: Diagnosis not present

## 2017-01-21 LAB — POCT INR: INR: 2

## 2017-01-21 NOTE — Patient Instructions (Signed)
Pre visit review using our clinic review tool, if applicable. No additional management support is needed unless otherwise documented below in the visit note. 

## 2017-01-21 NOTE — Telephone Encounter (Signed)
Daughter came in stating dad is needing refill on his Temazepam. Inform front desk to let daughter know that rx was filled and faxed back on Friday July 3rd. Called pharmacy spoke w/Anit verified if rx was received on Friday. Per pharmacist they did not received. Gave verbal auth from 01/19/17 on pt temazepam. Called vicki no answer LMOM w/status...Johny Chess

## 2017-01-21 NOTE — Progress Notes (Signed)
I have reviewed and agree with the plan. 

## 2017-01-27 ENCOUNTER — Other Ambulatory Visit: Payer: Self-pay | Admitting: Internal Medicine

## 2017-01-28 NOTE — Telephone Encounter (Signed)
Last dispensed 10/26/2016 with 60 tabs

## 2017-02-03 LAB — POCT INR: INR: 2.6

## 2017-02-04 ENCOUNTER — Ambulatory Visit (INDEPENDENT_AMBULATORY_CARE_PROVIDER_SITE_OTHER): Payer: Medicare Other | Admitting: General Practice

## 2017-02-04 DIAGNOSIS — I4891 Unspecified atrial fibrillation: Secondary | ICD-10-CM | POA: Diagnosis not present

## 2017-02-04 DIAGNOSIS — Z5181 Encounter for therapeutic drug level monitoring: Secondary | ICD-10-CM | POA: Diagnosis not present

## 2017-02-04 NOTE — Patient Instructions (Signed)
Pre visit review using our clinic review tool, if applicable. No additional management support is needed unless otherwise documented below in the visit note. 

## 2017-02-14 ENCOUNTER — Other Ambulatory Visit: Payer: Self-pay | Admitting: Internal Medicine

## 2017-02-15 NOTE — Telephone Encounter (Signed)
Please advise on the restoril

## 2017-02-17 DIAGNOSIS — I480 Paroxysmal atrial fibrillation: Secondary | ICD-10-CM | POA: Diagnosis not present

## 2017-02-17 LAB — POCT INR: INR: 2.3

## 2017-02-18 ENCOUNTER — Ambulatory Visit (INDEPENDENT_AMBULATORY_CARE_PROVIDER_SITE_OTHER): Payer: Medicare Other | Admitting: General Practice

## 2017-02-18 DIAGNOSIS — Z5181 Encounter for therapeutic drug level monitoring: Secondary | ICD-10-CM | POA: Diagnosis not present

## 2017-02-18 NOTE — Patient Instructions (Signed)
Pre visit review using our clinic review tool, if applicable. No additional management support is needed unless otherwise documented below in the visit note. 

## 2017-02-18 NOTE — Progress Notes (Signed)
I have reviewed and agree with the plan. 

## 2017-02-19 DIAGNOSIS — S9002XA Contusion of left ankle, initial encounter: Secondary | ICD-10-CM | POA: Diagnosis not present

## 2017-02-19 DIAGNOSIS — M19072 Primary osteoarthritis, left ankle and foot: Secondary | ICD-10-CM | POA: Diagnosis not present

## 2017-02-19 DIAGNOSIS — S8255XA Nondisplaced fracture of medial malleolus of left tibia, initial encounter for closed fracture: Secondary | ICD-10-CM | POA: Diagnosis not present

## 2017-03-04 ENCOUNTER — Ambulatory Visit (INDEPENDENT_AMBULATORY_CARE_PROVIDER_SITE_OTHER): Payer: Medicare Other | Admitting: General Practice

## 2017-03-04 DIAGNOSIS — I4891 Unspecified atrial fibrillation: Secondary | ICD-10-CM

## 2017-03-04 DIAGNOSIS — Z5181 Encounter for therapeutic drug level monitoring: Secondary | ICD-10-CM | POA: Diagnosis not present

## 2017-03-04 LAB — POCT INR: INR: 2.5

## 2017-03-04 NOTE — Patient Instructions (Signed)
Pre visit review using our clinic review tool, if applicable. No additional management support is needed unless otherwise documented below in the visit note. 

## 2017-03-04 NOTE — Progress Notes (Signed)
I have reviewed and agree with the plan. 

## 2017-03-15 ENCOUNTER — Other Ambulatory Visit: Payer: Self-pay | Admitting: Family

## 2017-03-15 ENCOUNTER — Other Ambulatory Visit: Payer: Self-pay | Admitting: Internal Medicine

## 2017-03-16 ENCOUNTER — Telehealth: Payer: Self-pay | Admitting: Cardiology

## 2017-03-16 NOTE — Telephone Encounter (Signed)
Pt of Dr. Stanford Breed Hx A Fib  Daughter c/o patient being "in A Fib most of the time" when VS checked on home cuff, increased sleepiness x 1 week, increased SOB x1-2 weeks.  Reports BP dropping, "extremely low" recently - unfortunately she's at work and doesn't have the BP readings. States patient feels sluggish or "underwater" when waking in the morning.  Denies recent med changes, no other obvious symptoms.  Pt refuses to see primary care due to frequently seeing different providers there - daughter feels he is getting poor continuity of care. His current PCP is out on leave.   Pt tells daughter his oxygen machine isn't working (she confirms it is). "fluctuating, dropping to zero" so she is concerned he is feeling short of breath often.  She initially stated pt refuses to see PA, "waste of my time to bring him up there" if he sees PA. However, we discussed this further and she stated mainly the patient had a bad experience w APP at [facility] office. She is agreeable to seeing APP here at [facility] if nothing available soon w Dr. Stanford Breed.

## 2017-03-16 NOTE — Telephone Encounter (Signed)
Last refill was 02/15/17 per [facility]

## 2017-03-16 NOTE — Telephone Encounter (Signed)
Discussed w Isaac Laud, discussed w Ms Mariea Clonts, APP visit scheduled for tomorrow at 2:30pm. Daughter is aware if patient having worse symptoms or sustained HR above >120 he may need to go to hospital instead.  She voiced understanding and thanks for help w visit.

## 2017-03-16 NOTE — Telephone Encounter (Signed)
[name] called pt needs to be seen per daughter pt sleeps all the time pt is always in Afib.    Needs and Appt. ASAP

## 2017-03-17 ENCOUNTER — Ambulatory Visit: Payer: Medicare Other | Admitting: Physician Assistant

## 2017-03-17 NOTE — Telephone Encounter (Signed)
Rx faxed

## 2017-03-18 ENCOUNTER — Ambulatory Visit (INDEPENDENT_AMBULATORY_CARE_PROVIDER_SITE_OTHER): Payer: Medicare Other | Admitting: General Practice

## 2017-03-18 DIAGNOSIS — Z5181 Encounter for therapeutic drug level monitoring: Secondary | ICD-10-CM

## 2017-03-18 LAB — POCT INR: INR: 1.7

## 2017-03-18 NOTE — Patient Instructions (Signed)
Pre visit review using our clinic review tool, if applicable. No additional management support is needed unless otherwise documented below in the visit note. 

## 2017-03-18 NOTE — Progress Notes (Signed)
I have reviewed and agree with the plan. 

## 2017-03-25 ENCOUNTER — Emergency Department (HOSPITAL_COMMUNITY)
Admission: EM | Admit: 2017-03-25 | Discharge: 2017-03-26 | Disposition: A | Discharge status: Home / Self Care | Payer: Medicare Other | Source: Home / Self Care | Attending: Emergency Medicine | Admitting: Emergency Medicine

## 2017-03-25 ENCOUNTER — Encounter (HOSPITAL_COMMUNITY): Payer: Self-pay | Admitting: Emergency Medicine

## 2017-03-25 DIAGNOSIS — I480 Paroxysmal atrial fibrillation: Secondary | ICD-10-CM | POA: Diagnosis not present

## 2017-03-25 DIAGNOSIS — R5383 Other fatigue: Secondary | ICD-10-CM | POA: Diagnosis not present

## 2017-03-25 DIAGNOSIS — R062 Wheezing: Secondary | ICD-10-CM | POA: Diagnosis not present

## 2017-03-25 DIAGNOSIS — I129 Hypertensive chronic kidney disease with stage 1 through stage 4 chronic kidney disease, or unspecified chronic kidney disease: Secondary | ICD-10-CM | POA: Insufficient documentation

## 2017-03-25 DIAGNOSIS — R031 Nonspecific low blood-pressure reading: Secondary | ICD-10-CM | POA: Diagnosis not present

## 2017-03-25 DIAGNOSIS — N400 Enlarged prostate without lower urinary tract symptoms: Secondary | ICD-10-CM | POA: Diagnosis not present

## 2017-03-25 DIAGNOSIS — Z79899 Other long term (current) drug therapy: Secondary | ICD-10-CM | POA: Insufficient documentation

## 2017-03-25 DIAGNOSIS — R55 Syncope and collapse: Secondary | ICD-10-CM | POA: Diagnosis not present

## 2017-03-25 DIAGNOSIS — J449 Chronic obstructive pulmonary disease, unspecified: Secondary | ICD-10-CM | POA: Insufficient documentation

## 2017-03-25 DIAGNOSIS — N183 Chronic kidney disease, stage 3 (moderate): Secondary | ICD-10-CM

## 2017-03-25 DIAGNOSIS — B0229 Other postherpetic nervous system involvement: Secondary | ICD-10-CM | POA: Diagnosis not present

## 2017-03-25 DIAGNOSIS — Z87891 Personal history of nicotine dependence: Secondary | ICD-10-CM

## 2017-03-25 DIAGNOSIS — M25561 Pain in right knee: Secondary | ICD-10-CM | POA: Diagnosis not present

## 2017-03-25 DIAGNOSIS — Z7901 Long term (current) use of anticoagulants: Secondary | ICD-10-CM | POA: Insufficient documentation

## 2017-03-25 DIAGNOSIS — I420 Dilated cardiomyopathy: Secondary | ICD-10-CM | POA: Diagnosis not present

## 2017-03-25 DIAGNOSIS — M7989 Other specified soft tissue disorders: Secondary | ICD-10-CM | POA: Diagnosis not present

## 2017-03-25 DIAGNOSIS — I5033 Acute on chronic diastolic (congestive) heart failure: Secondary | ICD-10-CM | POA: Diagnosis not present

## 2017-03-25 DIAGNOSIS — I130 Hypertensive heart and chronic kidney disease with heart failure and stage 1 through stage 4 chronic kidney disease, or unspecified chronic kidney disease: Secondary | ICD-10-CM | POA: Diagnosis not present

## 2017-03-25 NOTE — ED Triage Notes (Signed)
Per [facility]: Pt to ED from home for hypotension. Patient's family found him sitting on the bathroom floor upon checking on him. Patient states he was standing in front of the sink, felt his legs becoming heavy and sat down on the floor. He denies LOC, denies hitting hitting his head. No acute obvious injuries noted other than skin tear to R elbow. Upon EMS arrival, patient sitting on floor, SBP 88 palpated with no change when moved to couch. Patient denies dizziness/lightheadedness. He is on 4lmp O2 [employer] at home, SpO2 90% with EMS, HR 85 1st degree AV block with multiple PVCs. Patient's daughter states pt is supposed to use a walker at home instead of his cane, but was not using it this evening. Pt c/o chronic lower back pain, no change in his normal pain. A&O x 4, states he is ready to go home. Resp e/u, skin warm/dry.

## 2017-03-26 ENCOUNTER — Emergency Department (HOSPITAL_COMMUNITY): Payer: Medicare Other

## 2017-03-26 ENCOUNTER — Inpatient Hospital Stay (HOSPITAL_COMMUNITY)
Admission: EM | Admit: 2017-03-26 | Discharge: 2017-03-30 | DRG: 291 | Disposition: A | Discharge status: Home / Self Care | Payer: Medicare Other | Attending: Family Medicine | Admitting: Family Medicine

## 2017-03-26 ENCOUNTER — Encounter (HOSPITAL_COMMUNITY): Payer: Self-pay | Admitting: Physician Assistant

## 2017-03-26 ENCOUNTER — Other Ambulatory Visit: Payer: Self-pay

## 2017-03-26 DIAGNOSIS — W19XXXA Unspecified fall, initial encounter: Secondary | ICD-10-CM | POA: Diagnosis present

## 2017-03-26 DIAGNOSIS — B0229 Other postherpetic nervous system involvement: Secondary | ICD-10-CM | POA: Diagnosis present

## 2017-03-26 DIAGNOSIS — N183 Chronic kidney disease, stage 3 unspecified: Secondary | ICD-10-CM | POA: Diagnosis present

## 2017-03-26 DIAGNOSIS — M19012 Primary osteoarthritis, left shoulder: Secondary | ICD-10-CM | POA: Diagnosis present

## 2017-03-26 DIAGNOSIS — Z9889 Other specified postprocedural states: Secondary | ICD-10-CM

## 2017-03-26 DIAGNOSIS — C3490 Malignant neoplasm of unspecified part of unspecified bronchus or lung: Secondary | ICD-10-CM | POA: Diagnosis present

## 2017-03-26 DIAGNOSIS — R001 Bradycardia, unspecified: Secondary | ICD-10-CM | POA: Diagnosis present

## 2017-03-26 DIAGNOSIS — M170 Bilateral primary osteoarthritis of knee: Secondary | ICD-10-CM | POA: Diagnosis present

## 2017-03-26 DIAGNOSIS — R531 Weakness: Secondary | ICD-10-CM | POA: Diagnosis not present

## 2017-03-26 DIAGNOSIS — E860 Dehydration: Secondary | ICD-10-CM | POA: Diagnosis present

## 2017-03-26 DIAGNOSIS — Z87891 Personal history of nicotine dependence: Secondary | ICD-10-CM

## 2017-03-26 DIAGNOSIS — R55 Syncope and collapse: Secondary | ICD-10-CM | POA: Diagnosis not present

## 2017-03-26 DIAGNOSIS — I4891 Unspecified atrial fibrillation: Secondary | ICD-10-CM | POA: Diagnosis present

## 2017-03-26 DIAGNOSIS — R911 Solitary pulmonary nodule: Secondary | ICD-10-CM | POA: Diagnosis present

## 2017-03-26 DIAGNOSIS — Z800 Family history of malignant neoplasm of digestive organs: Secondary | ICD-10-CM

## 2017-03-26 DIAGNOSIS — K529 Noninfective gastroenteritis and colitis, unspecified: Secondary | ICD-10-CM | POA: Diagnosis present

## 2017-03-26 DIAGNOSIS — T148XXA Other injury of unspecified body region, initial encounter: Secondary | ICD-10-CM | POA: Diagnosis not present

## 2017-03-26 DIAGNOSIS — J449 Chronic obstructive pulmonary disease, unspecified: Secondary | ICD-10-CM | POA: Diagnosis present

## 2017-03-26 DIAGNOSIS — Z9981 Dependence on supplemental oxygen: Secondary | ICD-10-CM

## 2017-03-26 DIAGNOSIS — I480 Paroxysmal atrial fibrillation: Secondary | ICD-10-CM | POA: Diagnosis present

## 2017-03-26 DIAGNOSIS — I10 Essential (primary) hypertension: Secondary | ICD-10-CM | POA: Diagnosis present

## 2017-03-26 DIAGNOSIS — S199XXA Unspecified injury of neck, initial encounter: Secondary | ICD-10-CM | POA: Diagnosis not present

## 2017-03-26 DIAGNOSIS — Z7901 Long term (current) use of anticoagulants: Secondary | ICD-10-CM

## 2017-03-26 DIAGNOSIS — I482 Chronic atrial fibrillation: Secondary | ICD-10-CM | POA: Diagnosis not present

## 2017-03-26 DIAGNOSIS — Z96651 Presence of right artificial knee joint: Secondary | ICD-10-CM | POA: Diagnosis present

## 2017-03-26 DIAGNOSIS — I130 Hypertensive heart and chronic kidney disease with heart failure and stage 1 through stage 4 chronic kidney disease, or unspecified chronic kidney disease: Principal | ICD-10-CM | POA: Diagnosis present

## 2017-03-26 DIAGNOSIS — R296 Repeated falls: Secondary | ICD-10-CM

## 2017-03-26 DIAGNOSIS — I420 Dilated cardiomyopathy: Secondary | ICD-10-CM | POA: Diagnosis not present

## 2017-03-26 DIAGNOSIS — J8410 Pulmonary fibrosis, unspecified: Secondary | ICD-10-CM | POA: Diagnosis present

## 2017-03-26 DIAGNOSIS — Z79899 Other long term (current) drug therapy: Secondary | ICD-10-CM

## 2017-03-26 DIAGNOSIS — R5383 Other fatigue: Secondary | ICD-10-CM | POA: Diagnosis not present

## 2017-03-26 DIAGNOSIS — I7300 Raynaud's syndrome without gangrene: Secondary | ICD-10-CM | POA: Diagnosis present

## 2017-03-26 DIAGNOSIS — N400 Enlarged prostate without lower urinary tract symptoms: Secondary | ICD-10-CM | POA: Diagnosis present

## 2017-03-26 DIAGNOSIS — I5033 Acute on chronic diastolic (congestive) heart failure: Secondary | ICD-10-CM | POA: Diagnosis present

## 2017-03-26 DIAGNOSIS — M25561 Pain in right knee: Secondary | ICD-10-CM | POA: Diagnosis not present

## 2017-03-26 DIAGNOSIS — E876 Hypokalemia: Secondary | ICD-10-CM | POA: Diagnosis present

## 2017-03-26 DIAGNOSIS — R062 Wheezing: Secondary | ICD-10-CM

## 2017-03-26 DIAGNOSIS — Z7952 Long term (current) use of systemic steroids: Secondary | ICD-10-CM

## 2017-03-26 DIAGNOSIS — Z8249 Family history of ischemic heart disease and other diseases of the circulatory system: Secondary | ICD-10-CM

## 2017-03-26 DIAGNOSIS — M7989 Other specified soft tissue disorders: Secondary | ICD-10-CM | POA: Diagnosis not present

## 2017-03-26 DIAGNOSIS — M19011 Primary osteoarthritis, right shoulder: Secondary | ICD-10-CM | POA: Diagnosis present

## 2017-03-26 DIAGNOSIS — T501X5A Adverse effect of loop [high-ceiling] diuretics, initial encounter: Secondary | ICD-10-CM | POA: Diagnosis present

## 2017-03-26 DIAGNOSIS — S0990XA Unspecified injury of head, initial encounter: Secondary | ICD-10-CM | POA: Diagnosis not present

## 2017-03-26 DIAGNOSIS — H9319 Tinnitus, unspecified ear: Secondary | ICD-10-CM | POA: Diagnosis present

## 2017-03-26 LAB — CBC
HCT: 43.7 % (ref 39.0–52.0)
Hemoglobin: 14 g/dL (ref 13.0–17.0)
MCH: 29.6 pg (ref 26.0–34.0)
MCHC: 32 g/dL (ref 30.0–36.0)
MCV: 92.4 fL (ref 78.0–100.0)
Platelets: 157 10*3/uL (ref 150–400)
RBC: 4.73 MIL/uL (ref 4.22–5.81)
RDW: 15 % (ref 11.5–15.5)
WBC: 6.6 10*3/uL (ref 4.0–10.5)

## 2017-03-26 LAB — URINALYSIS, ROUTINE W REFLEX MICROSCOPIC
BACTERIA UA: NONE SEEN
BILIRUBIN URINE: NEGATIVE
Glucose, UA: NEGATIVE mg/dL
HGB URINE DIPSTICK: NEGATIVE
KETONES UR: NEGATIVE mg/dL
LEUKOCYTES UA: NEGATIVE
Nitrite: NEGATIVE
PROTEIN: 30 mg/dL — AB
SQUAMOUS EPITHELIAL / LPF: NONE SEEN
Specific Gravity, Urine: 1.019 (ref 1.005–1.030)
pH: 5 (ref 5.0–8.0)

## 2017-03-26 LAB — CBC WITH DIFFERENTIAL/PLATELET
Basophils Absolute: 0 10*3/uL (ref 0.0–0.1)
Basophils Relative: 1 %
Eosinophils Absolute: 0.1 10*3/uL (ref 0.0–0.7)
Eosinophils Relative: 1 %
HEMATOCRIT: 44.5 % (ref 39.0–52.0)
HEMOGLOBIN: 14.6 g/dL (ref 13.0–17.0)
LYMPHS ABS: 1.5 10*3/uL (ref 0.7–4.0)
LYMPHS PCT: 25 %
MCH: 30.4 pg (ref 26.0–34.0)
MCHC: 32.8 g/dL (ref 30.0–36.0)
MCV: 92.5 fL (ref 78.0–100.0)
MONO ABS: 0.5 10*3/uL (ref 0.1–1.0)
MONOS PCT: 9 %
NEUTROS ABS: 3.7 10*3/uL (ref 1.7–7.7)
Neutrophils Relative %: 64 %
Platelets: 158 10*3/uL (ref 150–400)
RBC: 4.81 MIL/uL (ref 4.22–5.81)
RDW: 15.3 % (ref 11.5–15.5)
WBC: 5.9 10*3/uL (ref 4.0–10.5)

## 2017-03-26 LAB — BRAIN NATRIURETIC PEPTIDE: B NATRIURETIC PEPTIDE 5: 227.8 pg/mL — AB (ref 0.0–100.0)

## 2017-03-26 LAB — BASIC METABOLIC PANEL
Anion gap: 10 (ref 5–15)
Anion gap: 7 (ref 5–15)
BUN: 16 mg/dL (ref 6–20)
BUN: 14 mg/dL (ref 6–20)
CALCIUM: 8.7 mg/dL — AB (ref 8.9–10.3)
CALCIUM: 9 mg/dL (ref 8.9–10.3)
CHLORIDE: 109 mmol/L (ref 101–111)
CO2: 24 mmol/L (ref 22–32)
CO2: 24 mmol/L (ref 22–32)
CREATININE: 1.35 mg/dL — AB (ref 0.61–1.24)
CREATININE: 1.35 mg/dL — AB (ref 0.61–1.24)
Chloride: 110 mmol/L (ref 101–111)
GFR calc Af Amer: 51 mL/min — ABNORMAL LOW (ref >60)
GFR calc Af Amer: 51 mL/min — ABNORMAL LOW (ref >60)
GFR calc non Af Amer: 44 mL/min — ABNORMAL LOW (ref >60)
GFR calc non Af Amer: 44 mL/min — ABNORMAL LOW (ref >60)
GLUCOSE: 132 mg/dL — AB (ref 65–99)
Glucose, Bld: 124 mg/dL — ABNORMAL HIGH (ref 65–99)
Potassium: 3.6 mmol/L (ref 3.5–5.1)
Potassium: 3.9 mmol/L (ref 3.5–5.1)
Sodium: 143 mmol/L (ref 135–145)
Sodium: 141 mmol/L (ref 135–145)

## 2017-03-26 LAB — PROTIME-INR
INR: 2.82
PROTHROMBIN TIME: 29.4 s — AB (ref 11.4–15.2)

## 2017-03-26 LAB — TROPONIN I
TROPONIN I: 0.05 ng/mL — AB (ref <0.03)
Troponin I: 0.05 ng/mL (ref <0.03)

## 2017-03-26 LAB — LACTIC ACID, PLASMA
Lactic Acid, Venous: 1 mmol/L (ref 0.5–1.9)
Lactic Acid, Venous: 1.5 mmol/L (ref 0.5–1.9)

## 2017-03-26 LAB — I-STAT TROPONIN, ED: Troponin i, poc: 0.05 ng/mL (ref 0.00–0.08)

## 2017-03-26 LAB — I-STAT CG4 LACTIC ACID, ED: Lactic Acid, Venous: 1.74 mmol/L (ref 0.5–1.9)

## 2017-03-26 MED ORDER — ONDANSETRON HCL 4 MG/2ML IJ SOLN: 4.0000 mg | Freq: Four times a day (QID) | INTRAMUSCULAR | Status: DC | PRN

## 2017-03-26 MED ORDER — SODIUM CHLORIDE 0.9 % IV SOLN
INTRAVENOUS | Status: DC
  Administered 2017-03-26 – 2017-03-28 (×3): via INTRAVENOUS

## 2017-03-26 MED ORDER — WARFARIN SODIUM 3 MG PO TABS: 3.0000 mg | ORAL_TABLET | ORAL | Status: DC

## 2017-03-26 MED ORDER — FINASTERIDE 5 MG PO TABS
5.0000 mg | ORAL_TABLET | Freq: Every day | ORAL | Status: DC
  Administered 2017-03-27 – 2017-03-30 (×4): 5 mg via ORAL
  Filled 2017-03-26 (×5): qty 1

## 2017-03-26 MED ORDER — TEMAZEPAM 15 MG PO CAPS
30.0000 mg | ORAL_CAPSULE | Freq: Every evening | ORAL | Status: DC | PRN
  Administered 2017-03-26 – 2017-03-29 (×4): 30 mg via ORAL
  Filled 2017-03-26 (×4): qty 2

## 2017-03-26 MED ORDER — MORPHINE SULFATE (PF) 4 MG/ML IV SOLN: 1.0000 mg | INTRAVENOUS | Status: DC | PRN

## 2017-03-26 MED ORDER — WARFARIN - PHARMACIST DOSING INPATIENT: Freq: Every day | Status: DC

## 2017-03-26 MED ORDER — LIDOCAINE 5 % EX PTCH: 1.0000 | MEDICATED_PATCH | Freq: Every day | CUTANEOUS | Status: DC | PRN

## 2017-03-26 MED ORDER — SODIUM CHLORIDE 0.9 % IV BOLUS (SEPSIS)
1000.0000 mL | Freq: Once | INTRAVENOUS | Status: AC
  Administered 2017-03-26: 1000 mL via INTRAVENOUS

## 2017-03-26 MED ORDER — METOPROLOL TARTRATE 25 MG PO TABS
25.0000 mg | ORAL_TABLET | Freq: Two times a day (BID) | ORAL | Status: DC
  Administered 2017-03-27 – 2017-03-29 (×5): 25 mg via ORAL
  Filled 2017-03-26 (×5): qty 1

## 2017-03-26 MED ORDER — WARFARIN SODIUM 1 MG PO TABS
1.5000 mg | ORAL_TABLET | ORAL | Status: DC
  Administered 2017-03-26 (×2): 1.5 mg via ORAL
  Filled 2017-03-26 (×2): qty 1

## 2017-03-26 MED ORDER — SODIUM CHLORIDE 0.9% FLUSH
3.0000 mL | Freq: Two times a day (BID) | INTRAVENOUS | Status: DC
  Administered 2017-03-27 – 2017-03-30 (×6): 3 mL via INTRAVENOUS

## 2017-03-26 MED ORDER — ACETAMINOPHEN 650 MG RE SUPP: 650.0000 mg | Freq: Four times a day (QID) | RECTAL | Status: DC | PRN

## 2017-03-26 MED ORDER — ONDANSETRON HCL 4 MG PO TABS: 4.0000 mg | ORAL_TABLET | Freq: Four times a day (QID) | ORAL | Status: DC | PRN

## 2017-03-26 MED ORDER — PANTOPRAZOLE SODIUM 40 MG PO TBEC
40.0000 mg | DELAYED_RELEASE_TABLET | Freq: Every day | ORAL | Status: DC
  Administered 2017-03-26 – 2017-03-30 (×5): 40 mg via ORAL
  Filled 2017-03-26 (×5): qty 1

## 2017-03-26 MED ORDER — TAMSULOSIN HCL 0.4 MG PO CAPS
0.4000 mg | ORAL_CAPSULE | Freq: Every day | ORAL | Status: DC
  Administered 2017-03-26 – 2017-03-29 (×4): 0.4 mg via ORAL
  Filled 2017-03-26 (×5): qty 1

## 2017-03-26 MED ORDER — ACETAMINOPHEN 325 MG PO TABS: 650.0000 mg | ORAL_TABLET | Freq: Four times a day (QID) | ORAL | Status: DC | PRN

## 2017-03-26 MED ORDER — AMLODIPINE BESYLATE 10 MG PO TABS
10.0000 mg | ORAL_TABLET | Freq: Every day | ORAL | Status: DC
  Administered 2017-03-27 – 2017-03-30 (×4): 10 mg via ORAL
  Filled 2017-03-26 (×4): qty 1

## 2017-03-26 MED ORDER — TRAMADOL HCL 50 MG PO TABS: 50.0000 mg | ORAL_TABLET | Freq: Four times a day (QID) | ORAL | Status: DC | PRN

## 2017-03-26 MED ORDER — PREDNISONE 10 MG PO TABS
10.0000 mg | ORAL_TABLET | Freq: Every day | ORAL | Status: DC
  Administered 2017-03-27 – 2017-03-30 (×4): 10 mg via ORAL
  Filled 2017-03-26 (×4): qty 1

## 2017-03-26 NOTE — ED Notes (Signed)
Doctor at bedside.

## 2017-03-26 NOTE — ED Provider Notes (Signed)
[facility] DEPT Provider Note   CSN: [id number] Arrival date & time: 03/25/17  2336     History   Chief Complaint Chief Complaint  Patient presents with  . Hypotension    HPI Ricardo Perkins is a 83 y.o. male.  83 yo M with a chief complaint of near syncope. Patient was standing at the sink in the bathroom and suddenly felt lightheaded. Family found him sitting on the bathroom floor. They called 911. When they arrived his blood pressure was in the 80 systolic. Resolve spontaneously with family got him on the truck. Patient denies any symptoms. Denies chest pain shortness breath abdominal pain headaches. Denies recent fevers or chills. Denies cough or congestion. Denies dark stool or blood in stool.   The history is provided by the patient.  Illness  This is a new problem. The current episode started 1 to 2 hours ago. The problem occurs rarely. The problem has been resolved. Pertinent negatives include no chest pain, no abdominal pain, no headaches and no shortness of breath. Nothing aggravates the symptoms. Nothing relieves the symptoms. He has tried nothing for the symptoms. The treatment provided no relief.    Past Medical History:  Diagnosis Date  . Advanced age   . Arthritis    knees, shoulders   . ASTEATOTIC ECZEMA   . BENIGN PROSTATIC HYPERTROPHY, WITH OBSTRUCTION   . BRADYCARDIA    1st degree AVB  . COPD (chronic obstructive pulmonary disease) (HCC)    on chronic oxygen therapy qhs>daytime  . DECREASED HEARING   . Hx of cardiovascular stress test    a.  MV 4/09:  possible small area of lateral ischemia at the apex, but Dr. Kirk Ruths reviewed this, and in fact, felt it was most likely normal.  b.  Myoview 3/13 was again low risk with an EF of 62% with small reversible defect in the apex suggestive of very mild apical ischemia.  Marland Kitchen Hx of echocardiogram    a. echo in 2009 (in [place]): normal EF, mild LVH;   b. echo 10/13:  mid Ant HK, mod LVH, EF 55%, trivial AI,  mod LAE, mild RVE, mild reduced RVSF, PASP 46  . HYPERTENSION   . INSOMNIA, PERSISTENT   . Large bilateral inguinal hernias - chronic, containing small bowel (right) and sigmoid colon (left) 01/15/2011   s/p B repair  . LEG EDEMA, BILATERAL   . OA (osteoarthritis) of knee    S/p right TKR March '13 (Dr. Wynelle Link)   . PAROXYSMAL ATRIAL FIBRILLATION    coumadin rx  . RENAL INSUFFICIENCY   . RHINITIS, VASOMOTOR 01/07/2010    Patient Active Problem List   Diagnosis Date Noted  . Encounter for therapeutic drug monitoring 07/16/2016  . Tinnitus 03/13/2016  . Solitary pulmonary nodule 12/20/2015  . Congestive dilated cardiomyopathy ([place]) 11/04/2015  . CKD (chronic kidney disease) stage 3, GFR 30-59 ml/min 10/03/2015  . BPH (benign prostatic hyperplasia) 10/03/2015  . PAROXYSMAL ATRIAL FIBRILLATION   . Post herpetic neuralgia 09/01/2015  . Postinflammatory pulmonary fibrosis ([place]) 02/12/2015  . Raynaud phenomenon 06/21/2014  . Essential hypertension 07/23/2008    Past Surgical History:  Procedure Laterality Date  . APPENDECTOMY     @ 23  . EYE SURGERY     bilateral cataract surgery   . HERNIA REPAIR  01/2011   bilateral inguinal hernia   . KNEE SURGERY  right  . OTHER SURGICAL HISTORY     left finger surgery   . TONSILLECTOMY    .  TOTAL KNEE ARTHROPLASTY  10/26/2011   Procedure: TOTAL KNEE ARTHROPLASTY;  Surgeon: Gearlean Alf, MD;  Location: [facility];  Service: Orthopedics;  Laterality: Right;       Home Medications    Prior to Admission medications   Medication Sig Start Date End Date Taking? Authorizing Provider  amLODipine (NORVASC) 10 MG tablet TAKE 1 TABLET BY MOUTH EVERY DAY 02/15/17  Yes Golden Circle, FNP  finasteride (PROSCAR) 5 MG tablet TAKE 1 TABLET(5 MG) BY MOUTH DAILY 02/15/17  Yes Golden Circle, FNP  Furosemide (LASIX PO) Take 1 tablet by mouth daily as needed (leg swelling).   Yes [provider]  lidocaine (LIDODERM) 5 % Place 1 patch onto  the skin daily. Remove & Discard patch within 12 hours or as directed by MD Patient taking differently: Place 1 patch onto the skin daily as needed (pain). Remove & Discard patch within 12 hours or as directed by MD 09/13/15  Yes [name], MD  metoprolol tartrate (LOPRESSOR) 25 MG tablet TAKE 1/2 TABLET(12.5 MG) BY MOUTH TWICE DAILY 06/08/16  Yes [name], MD  omeprazole (PRILOSEC) 20 MG capsule TAKE ONE CAPSULE BY MOUTH EVERY DAY AS NEEDED 06/10/15  Yes [name], MD  predniSONE (DELTASONE) 10 MG tablet Take 1 tablet (10 mg total) by mouth daily with breakfast. Annual appt is due  must see provider for future refills 03/18/17  Yes [name], MD  tamsulosin (FLOMAX) 0.4 MG CAPS capsule Take 1 capsule (0.4 mg total) by mouth daily after supper. --- Pt needs appt for further refills 08/16/15  Yes [name], MD  temazepam (RESTORIL) 30 MG capsule TAKE 1 CAPSULE BY MOUTH EVERY DAY AT BEDTIME 03/17/17  Yes Golden Circle, FNP  traMADol (ULTRAM) 50 MG tablet TAKE 1 TO 2 TABLETS BY MOUTH EVERY 6 HOURS AS NEEDED 01/28/17  Yes Golden Circle, FNP  warfarin (COUMADIN) 3 MG tablet AS DIRECTED Patient taking differently: Take 1.5-3 mg by mouth See admin instructions. 1.5mg  daily except 3mg  Monday 07/03/16  Yes [name], MD    Family History Family History  Problem Relation Age of Onset  . Heart disease Father   . Heart attack Father   . Cancer Mother        colon   . Cirrhosis Sister        liver failure  . Heart attack Brother   . Diabetes Neg Hx     Social History Social History  Substance Use Topics  . Smoking status: Former Smoker    Packs/day: 1.00    Years: 30.00    Types: Cigarettes    Quit date: 07/20/1998  . Smokeless tobacco: Never Used  . Alcohol use 0.0 oz/week     Comment: wine occasional      Allergies   Patient has no known allergies.   Review of Systems Review of Systems  Constitutional:  Negative for chills and fever.  HENT: Negative for congestion and facial swelling.   Eyes: Negative for discharge and visual disturbance.  Respiratory: Negative for shortness of breath.   Cardiovascular: Negative for chest pain and palpitations.  Gastrointestinal: Negative for abdominal pain, diarrhea and vomiting.  Musculoskeletal: Negative for arthralgias and myalgias.  Skin: Negative for color change and rash.  Neurological: Positive for syncope (near). Negative for tremors and headaches.  Psychiatric/Behavioral: Negative for confusion and dysphoric mood.     Physical Exam Updated Vital Signs BP 131/83   Pulse (!) 42  Temp 97.6 F (36.4 C) (Oral)   Resp (!) 25   Ht 5\' 7"  (1.702 m)   Wt 85.7 kg (189 lb)   SpO2 92%   BMI 29.60 kg/m   Physical Exam  Constitutional: He is oriented to person, place, and time. He appears well-developed and well-nourished.  Chronically ill appearing  HENT:  Head: Normocephalic and atraumatic.  Eyes: Pupils are equal, round, and reactive to light. EOM are normal.  Neck: Normal range of motion. Neck supple. No JVD present.  Cardiovascular: Normal rate and regular rhythm.  Exam reveals no gallop and no friction rub.   No murmur heard. Pulmonary/Chest: No respiratory distress. He has no wheezes.  Abdominal: He exhibits no distension and no mass. There is no tenderness. There is no rebound and no guarding.  Musculoskeletal: Normal range of motion. He exhibits edema, tenderness and deformity.  brusing and swelling to LLE.  Chronic per family.   Neurological: He is alert and oriented to person, place, and time.  Skin: No rash noted. No pallor.  Psychiatric: He has a normal mood and affect. His behavior is normal.  Nursing note and vitals reviewed.    ED Treatments / Results  Labs (all labs ordered are listed, but only abnormal results are displayed) Labs Reviewed  BASIC METABOLIC PANEL - Abnormal; Notable for the following:       Result Value    Glucose, Bld 124 (*)    Creatinine, Ser 1.35 (*)    GFR calc non Af Amer 44 (*)    GFR calc Af Amer 51 (*)    All other components within normal limits  URINALYSIS, ROUTINE W REFLEX MICROSCOPIC - Abnormal; Notable for the following:    Protein, ur 30 (*)    All other components within normal limits  PROTIME-INR - Abnormal; Notable for the following:    Prothrombin Time 29.4 (*)    All other components within normal limits  CBC WITH DIFFERENTIAL/PLATELET  I-STAT TROPONIN, ED  I-STAT CG4 LACTIC ACID, ED  I-STAT CG4 LACTIC ACID, ED    EKG  EKG Interpretation  Date/Time:  Friday March 26 2017 00:08:44 EDT Ventricular Rate:  95 PR Interval:    QRS Duration: 100 QT Interval:  376 QTC Calculation: 448 R Axis:   -31 Text Interpretation:  Sinus rhythm Ventricular bigeminy Prolonged PR interval Left ventricular hypertrophy Inferior infarct, old Anterior infarct, old Otherwise no significant change Confirmed by Deno Etienne [phone]) on 03/26/2017 12:32:26 AM       Radiology Dg Chest 2 View  Result Date: 03/26/2017 CLINICAL DATA:  Near syncope. EXAM: CHEST  2 VIEW COMPARISON:  Chest CT 07/26/2015.  Radiograph 04/22/2015 FINDINGS: Lower lung volumes from prior exam. Stable heart size allowing for differences in technique. Unchanged mediastinal contours with tortuosity of the thoracic aorta. Emphysematous changes on CT are not as well demonstrated radiographically. No confluent consolidation, pleural fluid or pneumothorax. No evidence of pulmonary edema. Streaky scarring in the right middle and lower lobes. IMPRESSION: No radiographic evidence of acute abnormality. Stable tortuous aorta and lung scarring. Electronically Signed   By: Jeb Levering M.D.   On: 03/26/2017 01:08    Procedures Procedures (including critical care time)  Medications Ordered in ED Medications  sodium chloride 0.9 % bolus 1,000 mL (0 mLs Intravenous Stopped 03/26/17 0144)     Initial Impression / Assessment  and Plan / ED Course  I have reviewed the triage vital signs and the nursing notes.  Pertinent labs & imaging results  that were available during my care of the patient were reviewed by me and considered in my medical decision making (see chart for details).     83 yo M With a chief complaint of what sounds like a near syncopal event. Patient is back to baseline currently. Has no complaints. Will obtain an infectious workup as well as screen for anemia or electrolyte abnormality give fluids reassess.  Patient with no concerning finding. Troponin is negative. EKG without signs of Brugada Wolff-Parkinson-White or prolonged QT. Discussed results with the patient to continues to be asymptomatic. Blood pressure has not trended down. Discharge home.  2:35 AM:  I have discussed the diagnosis/risks/treatment options with the patient and family and believe the pt to be eligible for discharge home to follow-up with PCP. We also discussed returning to the ED immediately if new or worsening sx occur. We discussed the sx which are most concerning (e.g., sudden worsening pain, fever, inability to tolerate by mouth) that necessitate immediate return. Medications administered to the patient during their visit and any new prescriptions provided to the patient are listed below.  Medications given during this visit Medications  sodium chloride 0.9 % bolus 1,000 mL (0 mLs Intravenous Stopped 03/26/17 0144)     The patient appears reasonably screen and/or stabilized for discharge and I doubt any other medical condition or other [facility] requiring further screening, evaluation, or treatment in the ED at this time prior to discharge.    Final Clinical Impressions(s) / ED Diagnoses   Final diagnoses:  Near syncope    New Prescriptions New Prescriptions   No medications on file     Deno Etienne, DO 03/26/17 [id number]

## 2017-03-26 NOTE — ED Triage Notes (Signed)
Arrived via EMS patient slipped out of bed onto right knee. States pain 10/10 sharp limited range of motion. EMS reported patient left AMA one day ago for another complaint. Patient alert answering and following commands appropriate. EMS reported HR 40-80 A-Fib and CBG 110.

## 2017-03-26 NOTE — ED Notes (Signed)
Patient's daughter adds that patient has mentioned over the past two weeks not feeling well here and there.

## 2017-03-26 NOTE — ED Notes (Signed)
Patient transported to x-ray. ?

## 2017-03-26 NOTE — ED Notes (Signed)
Sharene Butters, PA at bedside

## 2017-03-26 NOTE — H&P (Signed)
History and Physical    Ricardo Perkins OEU:[id number] DOB: 22-Oct-1922 DOA: 03/26/2017   PCP: [name], MD   Patient coming from:  Home    Chief Complaint: Generalized weakness  HPI: Ricardo Perkins is a 83 y.o. male with medical history significant for BPH, prior history of bradycardia, history of carotid artery stenosis, hypertension, atrial fibrillation, renal insufficiency, chronic insomnia, seen at the ED last night, with increased debility, initially felt to be near syncopal episode, but this was ruled out, hypotension, and a sustained a fall without hitting his head. He did not lose consciousness at the time or have any headaches. After extensive workup, the patient was discharged home, after every significant test ruled out any active disease. At home, the patient fell at 3 AM, when back to his bed, and noted to feel increasingly weak this morning, especially when standing up, falling again on his knee, at the area office of right surgical site for knee replacement. He experienced swelling in the area, but no open lesions were seen. He denies dizziness.   He denies any shortness of breath in the setting of chronic expiratory wheezing, he denies any cough, or sick contacts. He denies any chest pain or palpitations. He experiences intermittent nausea, without vomiting. No cough effusion is reported. He denies any abdominal pain. The patient does have chronic diarrhea, but no new acute issues with these are noted. He denies any worsening lower extremity edema. Appetite is normal. He drinks plenty fluids. He denies any alcohol, tobacco or recreational drug use. The patient is compliant with his medications, and has a very supportive family, especially his daughter who is a former Marine scientist.   ED Course:  BP (!) 137/58   Pulse 84   Temp 98.3 F (36.8 C) (Oral)   Resp 20   Ht 5\' 10"  (1.778 m)   Wt 83.9 kg (185 lb)   SpO2 96%   BMI 26.54 kg/m    Sodium 141 Potassium 3.9 bicarbonate 24  glucose 132 creatinine 1.35 calcium 8.7 GFR 44 troponin 0.05 lactic acid 1.74 white count 6.6 hemoglobin 14 platelets 157 Urine negative Right knee x-ray without any osseous abnormalities First-degree AV block and intermittent PVCs, similar to prior, new EKG is pending. Last echo in 07/26/2015 showed grade 1 diastolic dysfunction, EF 15-40%, mildly to moderately reduced systolic function.   Review of Systems:  As per HPI otherwise all other systems reviewed and are negative  Past Medical History:  Diagnosis Date  . Advanced age   . Arthritis    knees, shoulders   . ASTEATOTIC ECZEMA   . BENIGN PROSTATIC HYPERTROPHY, WITH OBSTRUCTION   . BRADYCARDIA    1st degree AVB  . COPD (chronic obstructive pulmonary disease) (HCC)    on chronic oxygen therapy qhs>daytime  . DECREASED HEARING   . Hx of cardiovascular stress test    a.  MV 4/09:  possible small area of lateral ischemia at the apex, but Dr. Kirk Ruths reviewed this, and in fact, felt it was most likely normal.  b.  Myoview 3/13 was again low risk with an EF of 62% with small reversible defect in the apex suggestive of very mild apical ischemia.  Marland Kitchen Hx of echocardiogram    a. echo in 2009 (in [place]): normal EF, mild LVH;   b. echo 10/13:  mid Ant HK, mod LVH, EF 55%, trivial AI, mod LAE, mild RVE, mild reduced RVSF, PASP 46  . HYPERTENSION   . INSOMNIA, PERSISTENT   .  Large bilateral inguinal hernias - chronic, containing small bowel (right) and sigmoid colon (left) 01/15/2011   s/p B repair  . LEG EDEMA, BILATERAL   . OA (osteoarthritis) of knee    S/p right TKR March '13 (Dr. Wynelle Link)   . PAROXYSMAL ATRIAL FIBRILLATION    coumadin rx  . RENAL INSUFFICIENCY   . RHINITIS, VASOMOTOR 01/07/2010    Past Surgical History:  Procedure Laterality Date  . APPENDECTOMY     @ 23  . EYE SURGERY     bilateral cataract surgery   . HERNIA REPAIR  01/2011   bilateral inguinal hernia   . KNEE SURGERY  right  . OTHER SURGICAL  HISTORY     left finger surgery   . TONSILLECTOMY    . TOTAL KNEE ARTHROPLASTY  10/26/2011   Procedure: TOTAL KNEE ARTHROPLASTY;  Surgeon: Gearlean Alf, MD;  Location: [facility];  Service: Orthopedics;  Laterality: Right;    Social History Social History   Social History  . Marital status: Single    Spouse name: N/A  . Number of children: N/A  . Years of education: N/A   Occupational History  . Not on file.   Social History Main Topics  . Smoking status: Former Smoker    Packs/day: 1.00    Years: 30.00    Types: Cigarettes    Quit date: 07/20/1998  . Smokeless tobacco: Never Used  . Alcohol use 0.0 oz/week     Comment: wine occasional   . Drug use: No  . Sexual activity: Not Currently   Other Topics Concern  . 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 Heart disease Father   . Heart attack Father   . Cancer Mother        colon   . Cirrhosis Sister        liver failure  . Heart attack Brother   . Diabetes Neg Hx       Prior to Admission medications   Medication Sig Start Date End Date Taking? Authorizing Provider  amLODipine (NORVASC) 10 MG tablet TAKE 1 TABLET BY MOUTH EVERY DAY 02/15/17   Golden Circle, FNP  finasteride (PROSCAR) 5 MG tablet TAKE 1 TABLET(5 MG) BY MOUTH DAILY 02/15/17   Golden Circle, FNP  Furosemide (LASIX PO) Take 1 tablet by mouth daily as needed (leg swelling).    [provider]  lidocaine (LIDODERM) 5 % Place 1 patch onto the skin daily. Remove & Discard patch within 12 hours or as directed by MD Patient taking differently: Place 1 patch onto the skin daily as needed (pain). Remove & Discard patch within 12 hours or as directed by MD 09/13/15   [name], MD  metoprolol  tartrate (LOPRESSOR) 25 MG tablet TAKE 1/2 TABLET(12.5 MG) BY MOUTH TWICE DAILY 06/08/16   [name], MD  omeprazole (PRILOSEC) 20 MG capsule TAKE ONE CAPSULE BY MOUTH EVERY DAY AS NEEDED 06/10/15   [name], MD  predniSONE (DELTASONE) 10 MG tablet Take 1 tablet (10 mg total) by mouth daily with breakfast. Annual appt is due  must see provider for future refills 03/18/17   Sharlet Salina,  Real Cons, MD  tamsulosin (FLOMAX) 0.4 MG CAPS capsule Take 1 capsule (0.4 mg total) by mouth daily after supper. --- Pt needs appt for further refills 08/16/15   [name], MD  temazepam (RESTORIL) 30 MG capsule TAKE 1 CAPSULE BY MOUTH EVERY DAY AT BEDTIME 03/17/17   Golden Circle, FNP  traMADol (ULTRAM) 50 MG tablet TAKE 1 TO 2 TABLETS BY MOUTH EVERY 6 HOURS AS NEEDED 01/28/17   Golden Circle, FNP  warfarin (COUMADIN) 3 MG tablet AS DIRECTED Patient taking differently: Take 1.5-3 mg by mouth See admin instructions. 1.5mg  daily except 3mg  Monday 07/03/16   [name], MD    Physical Exam:  Vitals:   03/26/17 1307 03/26/17 1309 03/26/17 1325 03/26/17 1415  BP:  133/66  (!) 137/58  Pulse:  (!) 44  84  Resp:  20  20  Temp:   98.3 F (36.8 C)   TempSrc:   Oral   SpO2: 92% 92% 98% 96%  Weight:  83.9 kg (185 lb)    Height:  5\' 10"  (1.778 m)     Constitutional: NAD, calm, comfortable  Eyes: PERRL, lids and conjunctivae normal ENMT: Mucous membranes are moist, without exudate or lesions  Neck: normal, supple, no masses, no thyromegaly Respiratory: bilateral wheezing, chronic, atelectatic sounds . No rhonchi or rales  Cardiovascular: Irregularly  irregular rate and rhythm,  murmur, rubs or gallops. No extremity edema. 2+ pedal pulses. No carotid bruits.  Abdomen: Soft, non tender, No hepatosplenomegaly. Bowel sounds positive.  Musculoskeletal: no clubbing / cyanosis. Moves all extremities. Left Kneee wel healed R replacement site, no open lesions. Somewhat  tender after fall  Skin: no jaundice, No lesions.  Neurologic: Sensation intact  Strength equal in all extremities Psychiatric:   Alert and oriented x 3. Normal mood.     Labs on Admission: I have personally reviewed following labs and imaging studies  CBC:  Recent Labs Lab 03/26/17 0013 03/26/17 1314  WBC 5.9 6.6  NEUTROABS 3.7  --   HGB 14.6 14.0  HCT 44.5 43.7  MCV 92.5 92.4  PLT 158 161    Basic Metabolic Panel:  Recent Labs Lab 03/26/17 0013 03/26/17 1314  NA 143 141  K 3.6 3.9  CL 109 110  CO2 24 24  GLUCOSE 124* 132*  BUN 16 14  CREATININE 1.35* 1.35*  CALCIUM 9.0 8.7*    GFR: Estimated Creatinine Clearance: 36 mL/min (A) (by C-G formula based on SCr of 1.35 mg/dL (H)).  Liver Function Tests: No results for input(s): AST, ALT, ALKPHOS, BILITOT, PROT, ALBUMIN in the last 168 hours. No results for input(s): LIPASE, AMYLASE in the last 168 hours. No results for input(s): AMMONIA in the last 168 hours.  Coagulation Profile:  Recent Labs Lab 03/26/17 0013  INR 2.82    Cardiac Enzymes: No results for input(s): CKTOTAL, CKMB, CKMBINDEX, TROPONINI in the last 168 hours.  BNP (last 3 results) No results for input(s): PROBNP in the last 8760 hours.  HbA1C: No results for input(s): HGBA1C in the last 72 hours.  CBG: No results for input(s): GLUCAP in the last 168 hours.  Lipid Profile: No results for input(s): CHOL, HDL, LDLCALC, TRIG, CHOLHDL, LDLDIRECT in the last 72 hours.  Thyroid Function Tests: No results for input(s): TSH, T4TOTAL, FREET4, T3FREE, THYROIDAB in the last 72 hours.  Anemia Panel: No results for input(s): VITAMINB12, FOLATE, FERRITIN, TIBC, IRON, RETICCTPCT in the last 72 hours.  Urine analysis:    Component  Value Date/Time   COLORURINE YELLOW 03/26/2017 0118   APPEARANCEUR CLEAR 03/26/2017 0118   LABSPEC 1.019 03/26/2017 0118   PHURINE 5.0 03/26/2017 0118   GLUCOSEU NEGATIVE 03/26/2017 0118   HGBUR NEGATIVE  03/26/2017 0118   BILIRUBINUR NEGATIVE 03/26/2017 0118   KETONESUR NEGATIVE 03/26/2017 0118   PROTEINUR 30 (A) 03/26/2017 0118   UROBILINOGEN 0.2 02/26/2012 1000   NITRITE NEGATIVE 03/26/2017 0118   LEUKOCYTESUR NEGATIVE 03/26/2017 0118    Sepsis Labs: @LABRCNTIP (procalcitonin:4,lacticidven:4) )No results found for this or any previous visit (from the past 240 hour(s)).   Radiological Exams on Admission: Dg Chest 2 View  Result Date: 03/26/2017 CLINICAL DATA:  Near syncope. EXAM: CHEST  2 VIEW COMPARISON:  Chest CT 07/26/2015.  Radiograph 04/22/2015 FINDINGS: Lower lung volumes from prior exam. Stable heart size allowing for differences in technique. Unchanged mediastinal contours with tortuosity of the thoracic aorta. Emphysematous changes on CT are not as well demonstrated radiographically. No confluent consolidation, pleural fluid or pneumothorax. No evidence of pulmonary edema. Streaky scarring in the right middle and lower lobes. IMPRESSION: No radiographic evidence of acute abnormality. Stable tortuous aorta and lung scarring. Electronically Signed   By: Jeb Levering M.D.   On: 03/26/2017 01:08   Dg Knee Complete 4 Views Right  Result Date: 03/26/2017 CLINICAL DATA:  Right knee pain for 2 months history of for placement EXAM: RIGHT KNEE - COMPLETE 4+ VIEW COMPARISON:  None. FINDINGS: Patient is status post right knee arthroplasty. Hardware demonstrates normal alignment and appears intact. No fracture is seen. No significant joint effusion. Popliteal vascular calcification. IMPRESSION: 1. Status post right knee replacement without acute osseous abnormality 2. Vascular calcification in the popliteal fossa Electronically Signed   By: Donavan Foil M.D.   On: 03/26/2017 14:11    EKG: Independently reviewed.  Assessment/Plan Active Problems:   Essential hypertension   Raynaud phenomenon   Postinflammatory pulmonary fibrosis (HCC)   Post herpetic neuralgia   PAROXYSMAL ATRIAL  FIBRILLATION   CKD (chronic kidney disease) stage 3, GFR 30-59 ml/min   BPH (benign prostatic hyperplasia)   Congestive dilated cardiomyopathy (HCC)   Solitary pulmonary nodule   Tinnitus      Generalized weakness, without syncope., with R Knee injury. No acute mental status. EKG  First-degree AV block and intermittent PVCs, similar to prior, new EKG is pending. Tn 0.06 Other labs unrevealing/ UA neg  Afebrile History of carotid artery stenosis and atrial fibrillation. Right knee x-ray without any osseous abnormalities Syncope order set  Tele obs Noncontrast CT scan of head 2 D echo Bilateral carotid artery ultrasound   Gentle IV fluids due to chronic CHF, cardiomyopathy EKG in am and serial troponin CXR  Hold Beta blockers a  Check orthostatics OT/PT  If symptoms do not improve or if echo or carotid dopplpers are positive, will have Cards/VVS involved   Hypertension BP 137/58   Pulse 84  Controlled Continue home anti-hypertensive medications in am due to orthostatic symptoms Check orthostatics as above    Chronic combined systolic and diastolic [facility] echo in 83/38/2505 showed grade 1 diastolic dysfunction, EF 39-76%, mildly to moderately reduced systolic function. Telemetry continue home meds in am, patient already took morning meds  monitor I/Os and daily weights prn 02 Repeat 2 D echo   Atrial Fibrillation on anticoagulation with Coumadin. First-degree AV block and intermittent PVCs, similar to prior, new EKG is pending. Tn 0.06  Continue meds and Coumadin per Pharmacy  EKG in am  Serial Tn  Pulmonary Fibrosis,  on chronic prednisone and 5 L O2 at home.  COntinue present therapy  Follow with pulmonologist as OP   Chronic kidney disease stage 3   Cr 1.35 at baseline  Lab Results  Component Value Date   CREATININE 1.35 (H) 03/26/2017   CREATININE 1.35 (H) 03/26/2017   CREATININE 1.31 (H) 01/20/2016  IVF Repeat CMET in am    Benign prostatic hypertrophy, no  acute issues Continue Flomax and Proscar  GERD, no acute symptoms Continue PPI  Chronic diarrhea, no fever, no pain, likely med related. No  blood in stools  GI panel.   Insomnia  ComtinueRestoril   DVT prophylaxis:  Coumadin per Pharmacy  Code Status:    Full Family Communication:  Discussed with patient and daughter  Disposition Plan: Expect patient to be discharged to home after condition improves Consults called:    None Admission status: Tele Obs    Rondel Jumbo, PA-C [facility]   03/26/2017, 4:06 PM

## 2017-03-26 NOTE — ED Notes (Signed)
Patient transported to CT 

## 2017-03-26 NOTE — Progress Notes (Signed)
ANTICOAGULATION CONSULT NOTE - Initial Consult  Pharmacy Consult for warfarin Indication: atrial fibrillation  No Known Allergies  Patient Measurements: Height: 5\' 10"  (177.8 cm) Weight: 185 lb (83.9 kg) IBW/kg (Calculated) : 73  Vital Signs: Temp: 98.3 F (36.8 C) (09/07 1325) Temp Source: Oral (09/07 1325) BP: 137/58 (09/07 1415) Pulse Rate: 84 (09/07 1415)  Labs:  Recent Labs  03/26/17 0013 03/26/17 1314  HGB 14.6 14.0  HCT 44.5 43.7  PLT 158 157  LABPROT 29.4*  --   INR 2.82  --   CREATININE 1.35* 1.35*    Estimated Creatinine Clearance: 36 mL/min (A) (by C-G formula based on SCr of 1.35 mg/dL (H)).   Medical History: Past Medical History:  Diagnosis Date  . Advanced age   . Arthritis    knees, shoulders   . ASTEATOTIC ECZEMA   . BENIGN PROSTATIC HYPERTROPHY, WITH OBSTRUCTION   . BRADYCARDIA    1st degree AVB  . COPD (chronic obstructive pulmonary disease) (HCC)    on chronic oxygen therapy qhs>daytime  . DECREASED HEARING   . Hx of cardiovascular stress test    a.  MV 4/09:  possible small area of lateral ischemia at the apex, but Dr. Kirk Ruths reviewed this, and in fact, felt it was most likely normal.  b.  Myoview 3/13 was again low risk with an EF of 62% with small reversible defect in the apex suggestive of very mild apical ischemia.  Marland Kitchen Hx of echocardiogram    a. echo in 2009 (in [place]): normal EF, mild LVH;   b. echo 10/13:  mid Ant HK, mod LVH, EF 55%, trivial AI, mod LAE, mild RVE, mild reduced RVSF, PASP 46  . HYPERTENSION   . INSOMNIA, PERSISTENT   . Large bilateral inguinal hernias - chronic, containing small bowel (right) and sigmoid colon (left) 01/15/2011   s/p B repair  . LEG EDEMA, BILATERAL   . OA (osteoarthritis) of knee    S/p right TKR March '13 (Dr. Wynelle Link)   . PAROXYSMAL ATRIAL FIBRILLATION    coumadin rx  . RENAL INSUFFICIENCY   . RHINITIS, VASOMOTOR 01/07/2010     Assessment: Patient on warfarin PTA for PAF.  Pharmacy consulted to monitor and dose while inpatient. Last anti-coag visit note from 8/30, INR was subtherapeutic at 1.7. INR this morning was therapeutic at 2.82. CBC wnl. Patient is being admitted after multiple falls in the last 24 hours. No bleeding noted.  PTA dose: warfarin 1.5 mg daily except for 3 mg once weekly on Mondays  Goal of Therapy:  INR 2-3 Monitor platelets by anticoagulation protocol: Yes   Plan:  Continue home warfarin dose of 3 mg on Mondays and 1.5 mg all other days Monitor daily INR, CBC, clinical course, s/sx of bleed, PO intake, DDI   Thank you for allowing [place] to participate in this patients care.  [name], PharmD Clinical phone for 03/26/2017 from 3:30-10:30p: [phone] If after 10:30p, please call main pharmacy at: [phone] 03/26/2017 4:54 PM

## 2017-03-26 NOTE — ED Notes (Signed)
Patient transported to radiology

## 2017-03-26 NOTE — Discharge Instructions (Signed)
Eat and drink well for the next couple of days.  Return for worsening symptoms.

## 2017-03-26 NOTE — ED Provider Notes (Signed)
[facility] DEPT Provider Note   CSN: [id number] Arrival date & time: 03/26/17  1307     History   Chief Complaint Chief Complaint  Patient presents with  . Fall    HPI Ricardo Perkins is a 83 y.o. male.  Patient had a fall today and landed on his right knee. This is his third fall in 24 hours. He is evaluated here yesterday and discharged home patient has significant weakness in his legs   The history is provided by the patient.  Fall  This is a recurrent problem. The problem occurs constantly. The problem has been resolved. Pertinent negatives include no chest pain, no abdominal pain and no headaches. Nothing aggravates the symptoms. Nothing relieves the symptoms. He has tried nothing for the symptoms. The treatment provided no relief.    Past Medical History:  Diagnosis Date  . Advanced age   . Arthritis    knees, shoulders   . ASTEATOTIC ECZEMA   . BENIGN PROSTATIC HYPERTROPHY, WITH OBSTRUCTION   . BRADYCARDIA    1st degree AVB  . COPD (chronic obstructive pulmonary disease) (HCC)    on chronic oxygen therapy qhs>daytime  . DECREASED HEARING   . Hx of cardiovascular stress test    a.  MV 4/09:  possible small area of lateral ischemia at the apex, but Dr. Kirk Ruths reviewed this, and in fact, felt it was most likely normal.  b.  Myoview 3/13 was again low risk with an EF of 62% with small reversible defect in the apex suggestive of very mild apical ischemia.  Marland Kitchen Hx of echocardiogram    a. echo in 2009 (in [place]): normal EF, mild LVH;   b. echo 10/13:  mid Ant HK, mod LVH, EF 55%, trivial AI, mod LAE, mild RVE, mild reduced RVSF, PASP 46  . HYPERTENSION   . INSOMNIA, PERSISTENT   . Large bilateral inguinal hernias - chronic, containing small bowel (right) and sigmoid colon (left) 01/15/2011   s/p B repair  . LEG EDEMA, BILATERAL   . OA (osteoarthritis) of knee    S/p right TKR March '13 (Dr. Wynelle Link)   . PAROXYSMAL ATRIAL FIBRILLATION    coumadin rx  . RENAL  INSUFFICIENCY   . RHINITIS, VASOMOTOR 01/07/2010    Patient Active Problem List   Diagnosis Date Noted  . Generalized weakness 03/26/2017  . Recurrent falls 03/26/2017  . Encounter for therapeutic drug monitoring 07/16/2016  . Tinnitus 03/13/2016  . Solitary pulmonary nodule 12/20/2015  . Congestive dilated cardiomyopathy ([place]) 11/04/2015  . CKD (chronic kidney disease) stage 3, GFR 30-59 ml/min 10/03/2015  . BPH (benign prostatic hyperplasia) 10/03/2015  . PAROXYSMAL ATRIAL FIBRILLATION   . Post herpetic neuralgia 09/01/2015  . Postinflammatory pulmonary fibrosis ([place]) 02/12/2015  . Raynaud phenomenon 06/21/2014  . Essential hypertension 07/23/2008    Past Surgical History:  Procedure Laterality Date  . APPENDECTOMY     @ 23  . EYE SURGERY     bilateral cataract surgery   . HERNIA REPAIR  01/2011   bilateral inguinal hernia   . KNEE SURGERY  right  . OTHER SURGICAL HISTORY     left finger surgery   . TONSILLECTOMY    . TOTAL KNEE ARTHROPLASTY  10/26/2011   Procedure: TOTAL KNEE ARTHROPLASTY;  Surgeon: Gearlean Alf, MD;  Location: [facility];  Service: Orthopedics;  Laterality: Right;       Home Medications    Prior to Admission medications   Medication Sig Start Date End  Date Taking? Authorizing Provider  amLODipine (NORVASC) 10 MG tablet TAKE 1 TABLET BY MOUTH EVERY DAY 02/15/17  Yes Golden Circle, FNP  finasteride (PROSCAR) 5 MG tablet TAKE 1 TABLET(5 MG) BY MOUTH DAILY 02/15/17  Yes Golden Circle, FNP  Furosemide (LASIX PO) Take 1 tablet by mouth daily as needed (leg swelling).   Yes [provider]  lidocaine (LIDODERM) 5 % Place 1 patch onto the skin daily. Remove & Discard patch within 12 hours or as directed by MD Patient taking differently: Place 1 patch onto the skin daily as needed (pain). Remove & Discard patch within 12 hours or as directed by MD 09/13/15  Yes [name], MD  metoprolol tartrate (LOPRESSOR) 25 MG tablet TAKE 1/2  TABLET(12.5 MG) BY MOUTH TWICE DAILY 06/08/16  Yes [name], MD  omeprazole (PRILOSEC) 20 MG capsule TAKE ONE CAPSULE BY MOUTH EVERY DAY AS NEEDED 06/10/15  Yes [name], MD  predniSONE (DELTASONE) 10 MG tablet Take 1 tablet (10 mg total) by mouth daily with breakfast. Annual appt is due  must see provider for future refills 03/18/17  Yes [name], MD  tamsulosin (FLOMAX) 0.4 MG CAPS capsule Take 1 capsule (0.4 mg total) by mouth daily after supper. --- Pt needs appt for further refills 08/16/15  Yes [name], MD  temazepam (RESTORIL) 30 MG capsule TAKE 1 CAPSULE BY MOUTH EVERY DAY AT BEDTIME 03/17/17  Yes Golden Circle, FNP  traMADol (ULTRAM) 50 MG tablet TAKE 1 TO 2 TABLETS BY MOUTH EVERY 6 HOURS AS NEEDED 01/28/17  Yes Golden Circle, FNP  warfarin (COUMADIN) 3 MG tablet AS DIRECTED Patient taking differently: Take 1.5-3 mg by mouth See admin instructions. 1.5mg  daily except 3mg  Monday 07/03/16  Yes [name], MD    Family History Family History  Problem Relation Age of Onset  . Heart disease Father   . Heart attack Father   . Cancer Mother        colon   . Cirrhosis Sister        liver failure  . Heart attack Brother   . Diabetes Neg Hx     Social History Social History  Substance Use Topics  . Smoking status: Former Smoker    Packs/day: 1.00    Years: 30.00    Types: Cigarettes    Quit date: 07/20/1998  . Smokeless tobacco: Never Used  . Alcohol use 0.0 oz/week     Comment: wine occasional      Allergies   Patient has no known allergies.   Review of Systems Review of Systems  Constitutional: Positive for fatigue. Negative for appetite change.  HENT: Negative for congestion, ear discharge and sinus pressure.   Eyes: Negative for discharge.  Respiratory: Negative for cough.   Cardiovascular: Negative for chest pain.  Gastrointestinal: Negative for abdominal pain and diarrhea.  Genitourinary: Negative  for frequency and hematuria.  Musculoskeletal: Negative for back pain.  Skin: Negative for rash.  Neurological: Negative for seizures and headaches.  Psychiatric/Behavioral: Negative for hallucinations.     Physical Exam Updated Vital Signs BP (!) 137/58   Pulse 84   Temp 98.3 F (36.8 C) (Oral)   Resp 20   Ht 5\' 10"  (1.778 m)   Wt 83.9 kg (185 lb)   SpO2 96%   BMI 26.54 kg/m   Physical Exam  Constitutional: He is oriented to person, place, and time. He appears well-developed.  HENT:  Head: Normocephalic.  Eyes: Conjunctivae and EOM are normal. No scleral icterus.  Neck: Neck supple. No thyromegaly present.  Cardiovascular: Normal rate.  Exam reveals no gallop and no friction rub.   No murmur heard. Irregular rate and rhythm  Pulmonary/Chest: No stridor. He has no wheezes. He has no rales. He exhibits no tenderness.  Abdominal: He exhibits no distension. There is no tenderness. There is no rebound.  Musculoskeletal: He exhibits edema.  Tender swollen right knee  Lymphadenopathy:    He has no cervical adenopathy.  Neurological: He is oriented to person, place, and time. He exhibits normal muscle tone. Coordination normal.  Skin: No rash noted. No erythema.  Psychiatric: He has a normal mood and affect. His behavior is normal.     ED Treatments / Results  Labs (all labs ordered are listed, but only abnormal results are displayed) Labs Reviewed  BASIC METABOLIC PANEL - Abnormal; Notable for the following:       Result Value   Glucose, Bld 132 (*)    Creatinine, Ser 1.35 (*)    Calcium 8.7 (*)    GFR calc non Af Amer 44 (*)    GFR calc Af Amer 51 (*)    All other components within normal limits  CBC  URINALYSIS, ROUTINE W REFLEX MICROSCOPIC    EKG  EKG Interpretation  Date/Time:  Friday March 26 2017 13:06:27 EDT Ventricular Rate:  82 PR Interval:  222 QRS Duration: 94 QT Interval:  362 QTC Calculation: 422 R Axis:   -20 Text Interpretation:  Sinus  rhythm with 1st degree A-V block with frequent Premature ventricular complexes in a pattern of bigeminy Moderate voltage criteria for LVH, may be normal variant Inferior infarct , age undetermined Anteroseptal infarct , age undetermined Abnormal ECG Confirmed by Milton Ferguson [phone]) on 03/26/2017 2:19:09 PM       Radiology Dg Chest 2 View  Result Date: 03/26/2017 CLINICAL DATA:  Near syncope. EXAM: CHEST  2 VIEW COMPARISON:  Chest CT 07/26/2015.  Radiograph 04/22/2015 FINDINGS: Lower lung volumes from prior exam. Stable heart size allowing for differences in technique. Unchanged mediastinal contours with tortuosity of the thoracic aorta. Emphysematous changes on CT are not as well demonstrated radiographically. No confluent consolidation, pleural fluid or pneumothorax. No evidence of pulmonary edema. Streaky scarring in the right middle and lower lobes. IMPRESSION: No radiographic evidence of acute abnormality. Stable tortuous aorta and lung scarring. Electronically Signed   By: Jeb Levering M.D.   On: 03/26/2017 01:08   Dg Knee Complete 4 Views Right  Result Date: 03/26/2017 CLINICAL DATA:  Right knee pain for 2 months history of for placement EXAM: RIGHT KNEE - COMPLETE 4+ VIEW COMPARISON:  None. FINDINGS: Patient is status post right knee arthroplasty. Hardware demonstrates normal alignment and appears intact. No fracture is seen. No significant joint effusion. Popliteal vascular calcification. IMPRESSION: 1. Status post right knee replacement without acute osseous abnormality 2. Vascular calcification in the popliteal fossa Electronically Signed   By: Donavan Foil M.D.   On: 03/26/2017 14:11    Procedures Procedures (including critical care time)  Medications Ordered in ED Medications - No data to display   Initial Impression / Assessment and Plan / ED Course  I have reviewed the triage vital signs and the nursing notes.  Pertinent labs & imaging results that were available during my  care of the patient were reviewed by me and considered in my medical decision making (see chart for details).     Patient will be  admitted for frequent falls and failure to thrive  Final Clinical Impressions(s) / ED Diagnoses   Final diagnoses:  None    New Prescriptions New Prescriptions   No medications on file     Milton Ferguson, MD 03/26/17 1624

## 2017-03-26 NOTE — ED Notes (Signed)
Patient verbalized understanding of discharge instructions and denies any further needs or questions at this time. VS stable. Patient ambulatory with steady gait, escorted to ED entrance in wheelchair.

## 2017-03-26 NOTE — ED Notes (Signed)
Family member arrived at bedside.

## 2017-03-27 ENCOUNTER — Observation Stay (HOSPITAL_COMMUNITY): Payer: Medicare Other

## 2017-03-27 DIAGNOSIS — I420 Dilated cardiomyopathy: Secondary | ICD-10-CM

## 2017-03-27 DIAGNOSIS — I10 Essential (primary) hypertension: Secondary | ICD-10-CM | POA: Diagnosis not present

## 2017-03-27 DIAGNOSIS — Z96651 Presence of right artificial knee joint: Secondary | ICD-10-CM | POA: Diagnosis present

## 2017-03-27 DIAGNOSIS — Z7901 Long term (current) use of anticoagulants: Secondary | ICD-10-CM | POA: Diagnosis not present

## 2017-03-27 DIAGNOSIS — M19012 Primary osteoarthritis, left shoulder: Secondary | ICD-10-CM | POA: Diagnosis present

## 2017-03-27 DIAGNOSIS — M19011 Primary osteoarthritis, right shoulder: Secondary | ICD-10-CM | POA: Diagnosis present

## 2017-03-27 DIAGNOSIS — R531 Weakness: Secondary | ICD-10-CM

## 2017-03-27 DIAGNOSIS — Z800 Family history of malignant neoplasm of digestive organs: Secondary | ICD-10-CM | POA: Diagnosis not present

## 2017-03-27 DIAGNOSIS — R062 Wheezing: Secondary | ICD-10-CM | POA: Diagnosis not present

## 2017-03-27 DIAGNOSIS — W19XXXA Unspecified fall, initial encounter: Secondary | ICD-10-CM | POA: Diagnosis not present

## 2017-03-27 DIAGNOSIS — I482 Chronic atrial fibrillation: Secondary | ICD-10-CM | POA: Diagnosis not present

## 2017-03-27 DIAGNOSIS — Z79899 Other long term (current) drug therapy: Secondary | ICD-10-CM | POA: Diagnosis not present

## 2017-03-27 DIAGNOSIS — R911 Solitary pulmonary nodule: Secondary | ICD-10-CM | POA: Diagnosis present

## 2017-03-27 DIAGNOSIS — Z9889 Other specified postprocedural states: Secondary | ICD-10-CM | POA: Diagnosis not present

## 2017-03-27 DIAGNOSIS — N183 Chronic kidney disease, stage 3 (moderate): Secondary | ICD-10-CM | POA: Diagnosis not present

## 2017-03-27 DIAGNOSIS — M170 Bilateral primary osteoarthritis of knee: Secondary | ICD-10-CM | POA: Diagnosis present

## 2017-03-27 DIAGNOSIS — R001 Bradycardia, unspecified: Secondary | ICD-10-CM | POA: Diagnosis present

## 2017-03-27 DIAGNOSIS — J9811 Atelectasis: Secondary | ICD-10-CM | POA: Diagnosis not present

## 2017-03-27 DIAGNOSIS — J8410 Pulmonary fibrosis, unspecified: Secondary | ICD-10-CM | POA: Diagnosis not present

## 2017-03-27 DIAGNOSIS — I361 Nonrheumatic tricuspid (valve) insufficiency: Secondary | ICD-10-CM | POA: Diagnosis not present

## 2017-03-27 DIAGNOSIS — I130 Hypertensive heart and chronic kidney disease with heart failure and stage 1 through stage 4 chronic kidney disease, or unspecified chronic kidney disease: Secondary | ICD-10-CM | POA: Diagnosis present

## 2017-03-27 DIAGNOSIS — Z8249 Family history of ischemic heart disease and other diseases of the circulatory system: Secondary | ICD-10-CM | POA: Diagnosis not present

## 2017-03-27 DIAGNOSIS — Z87891 Personal history of nicotine dependence: Secondary | ICD-10-CM | POA: Diagnosis not present

## 2017-03-27 DIAGNOSIS — N400 Enlarged prostate without lower urinary tract symptoms: Secondary | ICD-10-CM | POA: Diagnosis present

## 2017-03-27 DIAGNOSIS — H9319 Tinnitus, unspecified ear: Secondary | ICD-10-CM | POA: Diagnosis present

## 2017-03-27 DIAGNOSIS — I7300 Raynaud's syndrome without gangrene: Secondary | ICD-10-CM | POA: Diagnosis present

## 2017-03-27 DIAGNOSIS — Z9981 Dependence on supplemental oxygen: Secondary | ICD-10-CM | POA: Diagnosis not present

## 2017-03-27 DIAGNOSIS — I351 Nonrheumatic aortic (valve) insufficiency: Secondary | ICD-10-CM | POA: Diagnosis not present

## 2017-03-27 DIAGNOSIS — R296 Repeated falls: Secondary | ICD-10-CM | POA: Diagnosis not present

## 2017-03-27 DIAGNOSIS — I5033 Acute on chronic diastolic (congestive) heart failure: Secondary | ICD-10-CM | POA: Diagnosis present

## 2017-03-27 DIAGNOSIS — I480 Paroxysmal atrial fibrillation: Secondary | ICD-10-CM | POA: Diagnosis present

## 2017-03-27 DIAGNOSIS — B0229 Other postherpetic nervous system involvement: Secondary | ICD-10-CM | POA: Diagnosis not present

## 2017-03-27 DIAGNOSIS — J449 Chronic obstructive pulmonary disease, unspecified: Secondary | ICD-10-CM | POA: Diagnosis present

## 2017-03-27 LAB — COMPREHENSIVE METABOLIC PANEL
ALBUMIN: 2.9 g/dL — AB (ref 3.5–5.0)
ALT: 11 U/L — ABNORMAL LOW (ref 17–63)
AST: 12 U/L — ABNORMAL LOW (ref 15–41)
Alkaline Phosphatase: 45 U/L (ref 38–126)
Anion gap: 6 (ref 5–15)
BUN: 13 mg/dL (ref 6–20)
CHLORIDE: 111 mmol/L (ref 101–111)
CO2: 25 mmol/L (ref 22–32)
Calcium: 8.1 mg/dL — ABNORMAL LOW (ref 8.9–10.3)
Creatinine, Ser: 1.17 mg/dL (ref 0.61–1.24)
GFR calc Af Amer: >60 mL/min (ref >60)
GFR calc non Af Amer: 52 mL/min — ABNORMAL LOW (ref >60)
GLUCOSE: 107 mg/dL — AB (ref 65–99)
POTASSIUM: 3.3 mmol/L — AB (ref 3.5–5.1)
Sodium: 142 mmol/L (ref 135–145)
Total Bilirubin: 1.3 mg/dL — ABNORMAL HIGH (ref 0.3–1.2)
Total Protein: 5 g/dL — ABNORMAL LOW (ref 6.5–8.1)

## 2017-03-27 LAB — TROPONIN I: TROPONIN I: 0.05 ng/mL — AB (ref <0.03)

## 2017-03-27 LAB — CBC
HEMATOCRIT: 39.2 % (ref 39.0–52.0)
Hemoglobin: 12.2 g/dL — ABNORMAL LOW (ref 13.0–17.0)
MCH: 28.8 pg (ref 26.0–34.0)
MCHC: 31.1 g/dL (ref 30.0–36.0)
MCV: 92.7 fL (ref 78.0–100.0)
PLATELETS: 144 10*3/uL — AB (ref 150–400)
RBC: 4.23 MIL/uL (ref 4.22–5.81)
RDW: 15.2 % (ref 11.5–15.5)
WBC: 4.7 10*3/uL (ref 4.0–10.5)

## 2017-03-27 LAB — GLUCOSE, CAPILLARY: Glucose-Capillary: 92 mg/dL (ref 65–99)

## 2017-03-27 LAB — PROTIME-INR
INR: 3.48
PROTHROMBIN TIME: 34.7 s — AB (ref 11.4–15.2)

## 2017-03-27 NOTE — Progress Notes (Signed)
Called pharmacist about pt coumadin, per pt did not take it before coming to ED, said he takes it at bed time, pharmacist verbally instructed me to give 1.5mg ,  med given will continue to monitor pt

## 2017-03-27 NOTE — Evaluation (Signed)
Occupational Therapy Evaluation Patient Details Name: Ricardo Perkins MRN: [id number] DOB: 13-Dec-1922 Today's Date: 03/27/2017    History of Present Illness 83 yo male with onset of weakness adn FTT sustained a fall onto R knee mult times, has no current recall of falling on it. PMHx:  renal insufficiency, HTN, a-fib, OA, TKA, bradycardia, LE edema, COPD with wheezing, pulm nodule,    Clinical Impression   Pt was performing self care independently and assisted for IADL prior to admission. Presents with generalized weakness, impaired standing balance, moderate R knee pain and some memory deficits interfering with ability to perform self care and could only perform limited OOB mobility. Recommending SNF for ST rehab. Will follow acutely.    Follow Up Recommendations  SNF;Supervision/Assistance - 24 hour    Equipment Recommendations       Recommendations for Other Services       Precautions / Restrictions Precautions Precautions: Fall Precaution Comments: watch 02 Restrictions Weight Bearing Restrictions: No      Mobility Bed Mobility Overal bed mobility: Needs Assistance Bed Mobility: Supine to Sit;Sit to Supine     Supine to sit: Mod assist Sit to supine: Mod assist   General bed mobility comments: assist for LEs over EOB and trunk to sit up, assist for LEs into bed  Transfers Overall transfer level: Needs assistance Equipment used: Rolling walker (2 wheeled);1 person hand held assist Transfers: Sit to/from Stand Sit to Stand: Min assist         General transfer comment: assist to rise and control descent, pt took several steps along EOB     Balance Overall balance assessment: History of Falls;Needs assistance Sitting-balance support: Feet supported;Bilateral upper extremity supported Sitting balance-Leahy Scale: Fair     Standing balance support: Bilateral upper extremity supported;During functional activity Standing balance-Leahy Scale: Poor                             ADL either performed or assessed with clinical judgement   ADL Overall ADL's : Needs assistance/impaired Eating/Feeding: Independent;Sitting   Grooming: Wash/dry hands;Wash/dry face;Sitting;Set up   Upper Body Bathing: Minimal assistance;Sitting   Lower Body Bathing: Sit to/from stand;Maximal assistance   Upper Body Dressing : Set up;Sitting   Lower Body Dressing: Sit to/from stand;Maximal assistance   Toilet Transfer: Minimal assistance;Stand-pivot;RW Toilet Transfer Details (indicate cue type and reason): simulated Toileting- Clothing Manipulation and Hygiene: Sit to/from stand;Maximal assistance       Functional mobility during ADLs: Minimal assistance;Rolling walker       Vision Patient Visual Report: No change from baseline       Perception     Praxis      Pertinent Vitals/Pain Pain Assessment: Faces Faces Pain Scale: Hurts even more Pain Location: R knee with flexion or walking Pain Descriptors / Indicators: Tender;Sore Pain Intervention(s): Monitored during session;Repositioned     Hand Dominance Right   Extremity/Trunk Assessment Upper Extremity Assessment Upper Extremity Assessment: Generalized weakness   Lower Extremity Assessment Lower Extremity Assessment: Generalized weakness   Cervical / Trunk Assessment Cervical / Trunk Assessment: Kyphotic   Communication Communication Communication: HOH   Cognition Arousal/Alertness: Awake/alert Behavior During Therapy: WFL for tasks assessed/performed Overall Cognitive Status: No family/caregiver present to determine baseline cognitive functioning                                 General Comments: could not  recall events leading up to hospitalization   General Comments  Pt has edema and pain over R knee with reluctance to flex, limited to 70 deg flexion    Exercises     Shoulder Instructions      Home Living Family/patient expects to be discharged to:: Private  residence Living Arrangements: Children Available Help at Discharge: Family;Available PRN/intermittently Type of Home: House Home Access: Stairs to enter [employer] of Steps: 2 Entrance Stairs-Rails: Left Home Layout: Two level;Able to live on main level with bedroom/bathroom         Bathroom Toilet: Standard     Home Equipment: Cane - single point          Prior Functioning/Environment Level of Independence: Needs assistance  Gait / Transfers Assistance Needed: [facility] for short walks with no problem per pt ADL's / Homemaking Assistance Needed: daughter is there to assist housework and cooking, independent at self care            OT Problem List: Decreased strength;Decreased activity tolerance;Impaired balance (sitting and/or standing);Decreased knowledge of use of DME or AE;Pain      OT Treatment/Interventions: Self-care/ADL training;DME and/or AE instruction;Balance training;Patient/family education;Therapeutic activities    OT Goals(Current goals can be found in the care plan section) Acute Rehab OT Goals Patient Stated Goal: to make his R knee pain better OT Goal Formulation: With patient Time For Goal Achievement: 04/10/17 Potential to Achieve Goals: Good ADL Goals Pt Will Perform Grooming: with supervision;standing Pt Will Perform Lower Body Bathing: with supervision;with adaptive equipment;sit to/from stand Pt Will Perform Lower Body Dressing: with supervision;with adaptive equipment;sit to/from stand Pt Will Transfer to Toilet: with supervision;ambulating;regular height toilet Pt Will Perform Toileting - Clothing Manipulation and hygiene: with supervision;sit to/from stand Additional ADL Goal #1: Pt will perform bed mobility modified independently.  OT Frequency: Min 2X/week   Barriers to D/C:            Co-evaluation              AM-PAC PT "6 Clicks" Daily Activity     Outcome Measure Help from another person eating meals?: None Help  from another person taking care of personal grooming?: A Little Help from another person toileting, which includes using toliet, bedpan, or urinal?: A Lot Help from another person bathing (including washing, rinsing, drying)?: A Lot Help from another person to put on and taking off regular upper body clothing?: None Help from another person to put on and taking off regular lower body clothing?: A Lot 6 Click Score: 17   End of Session Equipment Utilized During Treatment: Gait belt;Rolling walker  Activity Tolerance: Patient limited by fatigue Patient left: in bed;with call bell/phone within reach;with bed alarm set  OT Visit Diagnosis: Unsteadiness on feet (R26.81);Muscle weakness (generalized) (M62.81);History of falling (Z91.81);Pain;Other abnormalities of gait and mobility (R26.89) Pain - Right/Left: Right Pain - part of body: Knee                Time: 2197-5883 OT Time Calculation (min): 18 min Charges:  OT General Charges $OT Visit: 1 Visit OT Evaluation $OT Eval Moderate Complexity: 1 Mod G-Codes: OT G-codes **NOT FOR INPATIENT CLASS** Functional Assessment Tool Used: Clinical judgement Functional Limitation: Self care Self Care Current Status (G5498): At least 60 percent but less than 80 percent impaired, limited or restricted Self Care Goal Status (Y6415): At least 20 percent but less than 40 percent impaired, limited or restricted   03/27/2017 Nestor Lewandowsky, OTR/L Pager: [phone]  Ricardo Perkins 03/27/2017, 1:31 PM

## 2017-03-27 NOTE — Progress Notes (Signed)
Pharmacist called to hold coumadin due to elevated INR.

## 2017-03-27 NOTE — Evaluation (Signed)
Physical Therapy Evaluation Patient Details Name: Ricardo Perkins MRN: [id number] DOB: 01/09/1923 Today's Date: 03/27/2017   History of Present Illness  83 yo male with onset of weakness adn FTT sustained a fall onto R knee mult times, has no current recall of falling on it. PMHx:  renal insufficiency, HTN, a-fib, OA, TKA, bradycardia, LE edema, COPD with wheezing, pulm nodule,   Clinical Impression  Pt is up to chair with PT after having considerable difficulty due to knee pain.   He is ready to consider some SNF care due to limited distance with gait, cognitive findings and his pain on R knee limiting safety and balance.   He will be followed acutely to see how he can progress with gait and balance, and to increase R knee ROM and strength as tolerated.    Follow Up Recommendations SNF    Equipment Recommendations  Rolling walker with 5" wheels    Recommendations for Other Services       Precautions / Restrictions Precautions Precautions: Fall (telemetry with O2 sats) Precaution Comments: fluctuating O2 sats, 4L O2  Restrictions Weight Bearing Restrictions: No      Mobility  Bed Mobility Overal bed mobility: Needs Assistance Bed Mobility: Supine to Sit     Supine to sit: Mod assist     General bed mobility comments: assisted to bring legs to side of bed and to sit up from sidelying  Transfers Overall transfer level: Needs assistance Equipment used: Rolling walker (2 wheeled);1 person hand held assist Transfers: Sit to/from Stand Sit to Stand: Min assist         General transfer comment: min assist to power up and control iniital standing  Ambulation/Gait Ambulation/Gait assistance: Min assist Ambulation Distance (Feet): 5 Feet Assistive device: Rolling walker (2 wheeled);1 person hand held assist Gait Pattern/deviations: Step-to pattern;Antalgic;Decreased stride length;Decreased weight shift to right;Wide base of support Gait velocity: reduced Gait velocity  interpretation: Below normal speed for age/gender General Gait Details: toe walking on RLE and reluctant to put the heel down  Stairs            Wheelchair Mobility    Modified Rankin (Stroke Patients Only)       Balance Overall balance assessment: History of Falls;Needs assistance Sitting-balance support: Feet supported;Bilateral upper extremity supported Sitting balance-Leahy Scale: Fair     Standing balance support: Bilateral upper extremity supported;During functional activity Standing balance-Leahy Scale: Poor                               Pertinent Vitals/Pain Pain Assessment: Faces Faces Pain Scale: Hurts even more Pain Location: R knee with flexion or walking Pain Descriptors / Indicators: Tender;Sore Pain Intervention(s): Monitored during session;Premedicated before session;Limited activity within patient's tolerance;Repositioned    Home Living Family/patient expects to be discharged to:: Private residence Living Arrangements: Children Available Help at Discharge: Family;Available PRN/intermittently Type of Home: House Home Access: Stairs to enter Entrance Stairs-Rails: Left Entrance Stairs-Number of Steps: 2 Home Layout: Two level;Able to live on main level with bedroom/bathroom Home Equipment: Kasandra Knudsen - single point      Prior Function Level of Independence: Needs assistance   Gait / Transfers Assistance Needed: [facility] for short walks with no problem per pt  ADL's / Homemaking Assistance Needed: daughter is there to assist housework and cooking        Hand Dominance   Dominant Hand: Right    Extremity/Trunk Assessment   Upper Extremity  Assessment Upper Extremity Assessment: Generalized weakness    Lower Extremity Assessment Lower Extremity Assessment: Generalized weakness    Cervical / Trunk Assessment Cervical / Trunk Assessment: Kyphotic  Communication   Communication: HOH  Cognition Arousal/Alertness: Awake/alert Behavior  During Therapy: WFL for tasks assessed/performed Overall Cognitive Status: No family/caregiver present to determine baseline cognitive functioning                                 General Comments: did not remember how he fell on R knee      General Comments General comments (skin integrity, edema, etc.): Pt has edema and pain over R knee with reluctance to flex, limited to 70 deg flexion    Exercises     Assessment/Plan    PT Assessment Patient needs continued PT services  PT Problem List Decreased strength;Decreased range of motion;Decreased activity tolerance;Decreased balance;Decreased mobility;Decreased coordination;Decreased cognition;Decreased knowledge of use of DME;Decreased safety awareness;Cardiopulmonary status limiting activity;Decreased skin integrity;Pain       PT Treatment Interventions DME instruction;Gait training;Stair training;Functional mobility training;Therapeutic activities;Therapeutic exercise;Balance training;Neuromuscular re-education;Patient/family education    PT Goals (Current goals can be found in the Care Plan section)  Acute Rehab PT Goals Patient Stated Goal: to make his R knee pain better PT Goal Formulation: With patient Time For Goal Achievement: 04/10/17 Potential to Achieve Goals: Good    Frequency Min 2X/week   Barriers to discharge Inaccessible home environment has stairs to enter house    Co-evaluation               AM-PAC PT "6 Clicks" Daily Activity  Outcome Measure Difficulty turning over in bed (including adjusting bedclothes, sheets and blankets)?: Unable Difficulty moving from lying on back to sitting on the side of the bed? : Unable Difficulty sitting down on and standing up from a chair with arms (e.g., wheelchair, bedside commode, etc,.)?: Unable Help needed moving to and from a bed to chair (including a wheelchair)?: A Little Help needed walking in hospital room?: A Little Help needed climbing 3-5 steps  with a railing? : Total 6 Click Score: 10    End of Session Equipment Utilized During Treatment: Gait belt;Oxygen Activity Tolerance: Patient limited by fatigue;Patient limited by pain Patient left: in chair;with call bell/phone within reach;with nursing/sitter in room Nurse Communication: Mobility status PT Visit Diagnosis: Unsteadiness on feet (R26.81);History of falling (Z91.81);Difficulty in walking, not elsewhere classified (R26.2)    Time: 9622-2979 PT Time Calculation (min) (ACUTE ONLY): 34 min   Charges:   PT Evaluation $PT Eval Moderate Complexity: 1 Mod PT Treatments $Gait Training: 8-22 mins   PT G Codes:   PT G-Codes **NOT FOR INPATIENT CLASS** Functional Assessment Tool Used: AM-PAC 6 Clicks Basic Mobility;Clinical judgement Functional Limitation: Mobility: Walking and moving around Mobility: Walking and Moving Around Current Status (G9211): At least 60 percent but less than 80 percent impaired, limited or restricted Mobility: Walking and Moving Around Goal Status [phone]): At least 1 percent but less than 20 percent impaired, limited or restricted    Ramond Dial 03/27/2017, 11:04 AM   Mee Hives, PT MS Acute Rehab Dept. Number: [place]

## 2017-03-27 NOTE — Progress Notes (Signed)
ANTICOAGULATION CONSULT NOTE   Pharmacy Consult for warfarin Indication: atrial fibrillation  No Known Allergies  Patient Measurements: Height: 5\' 10"  (177.8 cm) Weight: 188 lb 1.6 oz (85.3 kg) IBW/kg (Calculated) : 73  Vital Signs: Temp: 98 F (36.7 C) (09/08 0529) Temp Source: Oral (09/08 0529) BP: 120/67 (09/08 0529) Pulse Rate: 69 (09/08 0529)  Labs:  Recent Labs  03/26/17 0013 03/26/17 1314 03/26/17 1723 03/26/17 2006 03/26/17 2249 03/27/17 0513  HGB 14.6 14.0  --   --   --  12.2*  HCT 44.5 43.7  --   --   --  39.2  PLT 158 157  --   --   --  144*  LABPROT 29.4*  --   --   --   --  34.7*  INR 2.82  --   --   --   --  3.48  CREATININE 1.35* 1.35*  --   --   --  1.17  TROPONINI  --   --  0.05* 0.05* 0.05*  --     Estimated Creatinine Clearance: 41.6 mL/min (by C-G formula based on SCr of 1.17 mg/dL).    Assessment: Patient on warfarin PTA for PAF. Patient is being admitted after multiple falls in the last 24 hours. Pharmacy consulted to monitor and dose while inpatient. CBC wnl.  No bleeding noted per nurse. INR this morning is elevated, will hold warfarin tonight.   PTA dose: warfarin 1.5 mg daily except for 3 mg once weekly on Mondays  Goal of Therapy:  INR 2-3 Monitor platelets by anticoagulation protocol: Yes   Plan:  Hold warfarin dose tonight for elevated INR Monitor daily INR, CBC,  s/sx of bleed   Thank you for allowing [place] to participate in this patients care.  Jalene Mullet, Pharm.D. PGY1 Pharmacy Resident 03/27/2017 8:17 AM Main Pharmacy: [phone]

## 2017-03-27 NOTE — Progress Notes (Signed)
Pt admitted from ED per stretcher accompanied by an RN, Pt alert and oriented on arrival to the floor, self introduced to pt ID bracelet varrified with pt, pt has DNR and high fall risk bracelet on, vital signs are stable, fall risk  and skin assessment done, prescribed treatment started bed alarm turned on, call light and phone within reach pt able to demonstrate how to use them when needed, will continue to monitor

## 2017-03-27 NOTE — Progress Notes (Signed)
PROGRESS NOTE [facility]   Ricardo Perkins   IEP:[id number] DOB: 23-Aug-1922  DOA: 03/26/2017 PCP: [name], MD   Brief Narrative:  Ricardo Perkins is a 83 y/o M with PMHx of BPH, HTN, Afib, CKD, and carotid stenosis, presented to the ED c/o generalized weakness, and multiple fall, on admission unclear if syncope, although patient did not have LOC. Patient was admitted for PT evaluation and r/o potential causes of syncope.   Subjective: Patient seen and examined, was sitting in chair working with PT, had no complaints other than knee pain, which patient felt into. No acute events.   Assessment & Plan: Generalize weakness - multifactorial dehydration from diarrhea, arrhythmias and deconditioning. CT head normal, Unlikely to be syncope. Possible natural deconditioning Will r/o cvs conditions ECHO, and carotid doppler pending. EKG with no changes, monitor for arrhythmias. Patient had a knee replacement, but is unable to use that leg appropriately so poss causing some weakness as well  PT evaluation recommending SNF for STR  Fall precautions Will contact SW  HTN  BP stable  Continue current home medications   Chronic combine systolic and diastolic  Seems to be compensated at this time  Continue home meds ECHO pending   Paroxymal A fib on Warfarin  HR well controlled, continue coumadin  On metoprolol  Continue to telemetry for now - may be source of falling as well   Pulmonary fibrosis  On chronic prednisone, O2 dependent 5L at home  Stable   CKD stage 3 Cr at baseline  Continue to monitor  On gentle hydration   Chronic diarrhea - dehydration could be cause the falling a well   Problem is chronic d/c GI panel and contact precautions  Encourage oral hydration   DVT prophylaxis: Warfarin  Code Status: FULL  Family Communication: None at bedside  Disposition Plan: SNF  Consultants:   None   Procedures:   ECHO   Carotid doppler    Antimicrobials: Anti-infectives    None        Objective: Vitals:   03/27/17 0529 03/27/17 0949 03/27/17 1121 03/27/17 1302  BP: 120/67 140/70 (!) 142/73 138/70  Pulse: 69 72 72 77  Resp: 18 18  18   Temp: 98 F (36.7 C) 98.6 F (37 C)  98 F (36.7 C)  TempSrc: Oral   Oral  SpO2: 94% 93%  99%  Weight:      Height:        Intake/Output Summary (Last 24 hours) at 03/27/17 1719 Last data filed at 03/27/17 1600  Gross per 24 hour  Intake          2003.75 ml  Output              200 ml  Net          1803.75 ml   Filed Weights   03/26/17 1309 03/27/17 0500  Weight: 83.9 kg (185 lb) 85.3 kg (188 lb 1.6 oz)    Examination:  General exam: Appears calm and comfortable  HEENT: AC/AT, PERRLA, OP moist and clear Respiratory system: Good air entry, mild expiratory wheezing, no rales or crackles  Cardiovascular system: S1 & S2 heard, IRR IRR. No JVD, murmurs, rubs or gallops Gastrointestinal system: Abdomen is nondistended, soft and nontender.  Central nervous system: Alert and oriented. No focal neurological deficits. Extremities: No pedal edema. Skin: No rashes, lesions or ulcers Psychiatry: Judgement and insight appear normal. Mood & affect appropriate.    Data Reviewed: I have personally  reviewed following labs and imaging studies  CBC:  Recent Labs Lab 03/26/17 0013 03/26/17 1314 03/27/17 0513  WBC 5.9 6.6 4.7  NEUTROABS 3.7  --   --   HGB 14.6 14.0 12.2*  HCT 44.5 43.7 39.2  MCV 92.5 92.4 92.7  PLT 158 157 732*   Basic Metabolic Panel:  Recent Labs Lab 03/26/17 0013 03/26/17 1314 03/27/17 0513  NA 143 141 142  K 3.6 3.9 3.3*  CL 109 110 111  CO2 24 24 25   GLUCOSE 124* 132* 107*  BUN 16 14 13   CREATININE 1.35* 1.35* 1.17  CALCIUM 9.0 8.7* 8.1*   GFR: Estimated Creatinine Clearance: 41.6 mL/min (by C-G formula based on SCr of 1.17 mg/dL). Liver Function Tests:  Recent Labs Lab 03/27/17 0513  AST 12*  ALT 11*  ALKPHOS 45  BILITOT 1.3*   PROT 5.0*  ALBUMIN 2.9*   No results for input(s): LIPASE, AMYLASE in the last 168 hours. No results for input(s): AMMONIA in the last 168 hours. Coagulation Profile:  Recent Labs Lab 03/26/17 0013 03/27/17 0513  INR 2.82 3.48   Cardiac Enzymes:  Recent Labs Lab 03/26/17 1723 03/26/17 2006 03/26/17 2249  TROPONINI 0.05* 0.05* 0.05*   BNP (last 3 results) No results for input(s): PROBNP in the last 8760 hours. HbA1C: No results for input(s): HGBA1C in the last 72 hours. CBG:  Recent Labs Lab 03/27/17 0812  GLUCAP 92   Lipid Profile: No results for input(s): CHOL, HDL, LDLCALC, TRIG, CHOLHDL, LDLDIRECT in the last 72 hours. Thyroid Function Tests: No results for input(s): TSH, T4TOTAL, FREET4, T3FREE, THYROIDAB in the last 72 hours. Anemia Panel: No results for input(s): VITAMINB12, FOLATE, FERRITIN, TIBC, IRON, RETICCTPCT in the last 72 hours. Sepsis Labs:  Recent Labs Lab 03/26/17 0026 03/26/17 1723 03/26/17 2006  LATICACIDVEN 1.74 1.0 1.5    No results found for this or any previous visit (from the past 240 hour(s)).    Radiology Studies: X-ray Chest Pa And Lateral  Result Date: 03/27/2017 CLINICAL DATA:  Wheezing. EXAM: CHEST  2 VIEW COMPARISON:  Radiographs of March 26, 2017. FINDINGS: Stable cardiomegaly. No pneumothorax or pleural effusion is noted. Stable elevated right hemidiaphragm is noted. Mild bibasilar subsegmental atelectasis is noted. Bony thorax is unremarkable. IMPRESSION: Mild bibasilar subsegmental atelectasis. Electronically Signed   By: Marijo Conception, M.D.   On: 03/27/2017 09:17   Dg Chest 2 View  Result Date: 03/26/2017 CLINICAL DATA:  Near syncope. EXAM: CHEST  2 VIEW COMPARISON:  Chest CT 07/26/2015.  Radiograph 04/22/2015 FINDINGS: Lower lung volumes from prior exam. Stable heart size allowing for differences in technique. Unchanged mediastinal contours with tortuosity of the thoracic aorta. Emphysematous changes on CT are not as  well demonstrated radiographically. No confluent consolidation, pleural fluid or pneumothorax. No evidence of pulmonary edema. Streaky scarring in the right middle and lower lobes. IMPRESSION: No radiographic evidence of acute abnormality. Stable tortuous aorta and lung scarring. Electronically Signed   By: Jeb Levering M.D.   On: 03/26/2017 01:08   Ct Head Wo Contrast  Result Date: 03/26/2017 CLINICAL DATA:  Head trauma, fall EXAM: CT HEAD WITHOUT CONTRAST CT CERVICAL SPINE WITHOUT CONTRAST TECHNIQUE: Multidetector CT imaging of the head and cervical spine was performed following the standard protocol without intravenous contrast. Multiplanar CT image reconstructions of the cervical spine were also generated. COMPARISON:  01/20/2016 FINDINGS: CT HEAD FINDINGS Brain: Generalized atrophy. Normal ventricular morphology. No midline shift or mass effect. Small vessel chronic ischemic changes of  deep cerebral white matter. No intracranial hemorrhage, mass lesion, evidence of acute infarction, or extra-axial fluid collection. Vascular: Atherosclerotic calcification of internal carotid arteries at skullbase Skull: Intact Sinuses/Orbits: Clear Other: N/A CT CERVICAL SPINE FINDINGS Alignment: Minimal retrolisthesis at C2-C3 and anterolisthesis at C4-C5, unchanged. Remaining alignments normal Skull base and vertebrae: Skullbase intact. Mild osseous demineralization diffusely. Scattered multilevel facet degenerative changes. Vertebral body heights maintained without fracture or bone destruction. Soft tissues and spinal canal: Prevertebral soft tissues normal thickness. Regional soft tissues unremarkable. Disc levels: Multilevel disc space narrowing. No gross disc herniation. Upper chest: Respiratory motion artifacts at lung apices. Other: Atherosclerotic calcification aortic arch. IMPRESSION: Atrophy with small vessel chronic ischemic changes of deep cerebral white matter. No acute intracranial abnormalities. Multilevel  degenerative disc and facet disease changes of the cervical spine. No acute cervical spine abnormalities. Aortic Atherosclerosis (ICD10-I70.0). Electronically Signed   By: Lavonia Dana M.D.   On: 03/26/2017 16:52   Ct Cervical Spine Wo Contrast  Result Date: 03/26/2017 CLINICAL DATA:  Head trauma, fall EXAM: CT HEAD WITHOUT CONTRAST CT CERVICAL SPINE WITHOUT CONTRAST TECHNIQUE: Multidetector CT imaging of the head and cervical spine was performed following the standard protocol without intravenous contrast. Multiplanar CT image reconstructions of the cervical spine were also generated. COMPARISON:  01/20/2016 FINDINGS: CT HEAD FINDINGS Brain: Generalized atrophy. Normal ventricular morphology. No midline shift or mass effect. Small vessel chronic ischemic changes of deep cerebral white matter. No intracranial hemorrhage, mass lesion, evidence of acute infarction, or extra-axial fluid collection. Vascular: Atherosclerotic calcification of internal carotid arteries at skullbase Skull: Intact Sinuses/Orbits: Clear Other: N/A CT CERVICAL SPINE FINDINGS Alignment: Minimal retrolisthesis at C2-C3 and anterolisthesis at C4-C5, unchanged. Remaining alignments normal Skull base and vertebrae: Skullbase intact. Mild osseous demineralization diffusely. Scattered multilevel facet degenerative changes. Vertebral body heights maintained without fracture or bone destruction. Soft tissues and spinal canal: Prevertebral soft tissues normal thickness. Regional soft tissues unremarkable. Disc levels: Multilevel disc space narrowing. No gross disc herniation. Upper chest: Respiratory motion artifacts at lung apices. Other: Atherosclerotic calcification aortic arch. IMPRESSION: Atrophy with small vessel chronic ischemic changes of deep cerebral white matter. No acute intracranial abnormalities. Multilevel degenerative disc and facet disease changes of the cervical spine. No acute cervical spine abnormalities. Aortic Atherosclerosis  (ICD10-I70.0). Electronically Signed   By: Lavonia Dana M.D.   On: 03/26/2017 16:52   Dg Knee Complete 4 Views Right  Result Date: 03/26/2017 CLINICAL DATA:  Right knee pain for 2 months history of for placement EXAM: RIGHT KNEE - COMPLETE 4+ VIEW COMPARISON:  None. FINDINGS: Patient is status post right knee arthroplasty. Hardware demonstrates normal alignment and appears intact. No fracture is seen. No significant joint effusion. Popliteal vascular calcification. IMPRESSION: 1. Status post right knee replacement without acute osseous abnormality 2. Vascular calcification in the popliteal fossa Electronically Signed   By: Donavan Foil M.D.   On: 03/26/2017 14:11      Scheduled Meds: . amLODipine  10 mg Oral Daily  . finasteride  5 mg Oral Daily  . metoprolol tartrate  25 mg Oral BID  . pantoprazole  40 mg Oral Daily  . predniSONE  10 mg Oral Q breakfast  . sodium chloride flush  3 mL Intravenous Q12H  . tamsulosin  0.4 mg Oral QPC supper  . Warfarin - Pharmacist Dosing Inpatient   Does not apply q1800   Continuous Infusions: . sodium chloride 75 mL/hr at 03/27/17 0806     LOS: 0 days    Time  spent: Total of 25 minutes spent with pt, greater than 50% of which was spent in discussion of  treatment, counseling and coordination of care    Chipper Oman, MD Pager: Text Page via [email or web address]   If 7PM-7AM, please contact night-coverage [email or web address] 03/27/2017, 5:19 PM

## 2017-03-27 NOTE — Progress Notes (Signed)
VASCULAR LAB PRELIMINARY  PRELIMINARY  PRELIMINARY  PRELIMINARY  Carotid duplex completed.    Preliminary report:  Unable to visualize right ICA and ECA secondary to high bifurcation. 1-39% left ICA plaquing.  Bilateral vertebral artery flow is antegrade.    Ricardo Perkins, RVT 03/27/2017, 2:43 PM

## 2017-03-28 ENCOUNTER — Inpatient Hospital Stay (HOSPITAL_COMMUNITY): Payer: Medicare Other

## 2017-03-28 DIAGNOSIS — B0229 Other postherpetic nervous system involvement: Secondary | ICD-10-CM

## 2017-03-28 DIAGNOSIS — I361 Nonrheumatic tricuspid (valve) insufficiency: Secondary | ICD-10-CM

## 2017-03-28 DIAGNOSIS — W19XXXA Unspecified fall, initial encounter: Secondary | ICD-10-CM

## 2017-03-28 DIAGNOSIS — R296 Repeated falls: Secondary | ICD-10-CM

## 2017-03-28 DIAGNOSIS — I351 Nonrheumatic aortic (valve) insufficiency: Secondary | ICD-10-CM

## 2017-03-28 LAB — CBC
HCT: 39.6 % (ref 39.0–52.0)
HCT: 40.3 % (ref 39.0–52.0)
Hemoglobin: 12.1 g/dL — ABNORMAL LOW (ref 13.0–17.0)
Hemoglobin: 12.6 g/dL — ABNORMAL LOW (ref 13.0–17.0)
MCH: 28.9 pg (ref 26.0–34.0)
MCH: 29.4 pg (ref 26.0–34.0)
MCHC: 30.6 g/dL (ref 30.0–36.0)
MCHC: 31.3 g/dL (ref 30.0–36.0)
MCV: 94.7 fL (ref 78.0–100.0)
MCV: 93.9 fL (ref 78.0–100.0)
PLATELETS: 163 10*3/uL (ref 150–400)
Platelets: 154 10*3/uL (ref 150–400)
RBC: 4.18 MIL/uL — ABNORMAL LOW (ref 4.22–5.81)
RBC: 4.29 MIL/uL (ref 4.22–5.81)
RDW: 15.3 % (ref 11.5–15.5)
RDW: 15.3 % (ref 11.5–15.5)
WBC: 5.7 10*3/uL (ref 4.0–10.5)
WBC: 5.7 10*3/uL (ref 4.0–10.5)

## 2017-03-28 LAB — BASIC METABOLIC PANEL
ANION GAP: 6 (ref 5–15)
BUN: 17 mg/dL (ref 6–20)
CALCIUM: 8.1 mg/dL — AB (ref 8.9–10.3)
CO2: 26 mmol/L (ref 22–32)
Chloride: 112 mmol/L — ABNORMAL HIGH (ref 101–111)
Creatinine, Ser: 1.42 mg/dL — ABNORMAL HIGH (ref 0.61–1.24)
GFR calc Af Amer: 48 mL/min — ABNORMAL LOW (ref >60)
GFR, EST NON AFRICAN AMERICAN: 41 mL/min — AB (ref >60)
GLUCOSE: 84 mg/dL (ref 65–99)
Potassium: 3.5 mmol/L (ref 3.5–5.1)
SODIUM: 144 mmol/L (ref 135–145)

## 2017-03-28 LAB — VAS US CAROTID
LCCAPDIAS: 11 cm/s
LEFT ECA DIAS: -12 cm/s
LEFT VERTEBRAL DIAS: 16 cm/s
LICADDIAS: -13 cm/s
LICADSYS: -36 cm/s
LICAPDIAS: -9 cm/s
Left CCA dist dias: -9 cm/s
Left CCA dist sys: -47 cm/s
Left CCA prox sys: 48 cm/s
Left ICA prox sys: -32 cm/s
RCCAPSYS: 51 cm/s
RIGHT VERTEBRAL DIAS: -9 cm/s
Right CCA prox dias: 4 cm/s

## 2017-03-28 LAB — ECHOCARDIOGRAM COMPLETE
HEIGHTINCHES: 70 in
WEIGHTICAEL: 3009.6 [oz_av]

## 2017-03-28 LAB — GLUCOSE, CAPILLARY: GLUCOSE-CAPILLARY: 102 mg/dL — AB (ref 65–99)

## 2017-03-28 LAB — PROTIME-INR
INR: 3.82
PROTHROMBIN TIME: 37.3 s — AB (ref 11.4–15.2)

## 2017-03-28 LAB — MAGNESIUM: MAGNESIUM: 1.9 mg/dL (ref 1.7–2.4)

## 2017-03-28 MED ORDER — FUROSEMIDE 10 MG/ML IJ SOLN
20.0000 mg | Freq: Every day | INTRAMUSCULAR | Status: DC
  Administered 2017-03-28 – 2017-03-29 (×2): 20 mg via INTRAVENOUS
  Filled 2017-03-28 (×2): qty 2

## 2017-03-28 MED ORDER — ALBUTEROL SULFATE (2.5 MG/3ML) 0.083% IN NEBU
2.5000 mg | INHALATION_SOLUTION | Freq: Once | RESPIRATORY_TRACT | Status: AC
Start: 2017-03-28 — End: 2017-03-28
  Administered 2017-03-28: 2.5 mg via RESPIRATORY_TRACT
  Filled 2017-03-28: qty 3

## 2017-03-28 NOTE — Progress Notes (Signed)
  Echocardiogram 2D Echocardiogram has been performed.  Sriman Tally T Kalaysia Demonbreun 03/28/2017, 9:12 AM

## 2017-03-28 NOTE — Progress Notes (Signed)
ANTICOAGULATION CONSULT NOTE   Pharmacy Consult for warfarin Indication: atrial fibrillation  No Known Allergies  Patient Measurements: Height: 5\' 10"  (177.8 cm) Weight: 188 lb 1.6 oz (85.3 kg) IBW/kg (Calculated) : 73  Vital Signs: Temp: 97.9 F (36.6 C) (09/09 0550) Temp Source: Oral (09/09 0550) BP: 122/75 (09/09 0550) Pulse Rate: 76 (09/09 0550)  Labs:  Recent Labs  03/26/17 0013 03/26/17 1314 03/26/17 1723 03/26/17 2006 03/26/17 2249 03/27/17 0513 03/28/17 0453  HGB 14.6 14.0  --   --   --  12.2* 12.1*  HCT 44.5 43.7  --   --   --  39.2 39.6  PLT 158 157  --   --   --  144* 163  LABPROT 29.4*  --   --   --   --  34.7* 37.3*  INR 2.82  --   --   --   --  3.48 3.82  CREATININE 1.35* 1.35*  --   --   --  1.17 1.42*  TROPONINI  --   --  0.05* 0.05* 0.05*  --   --     Estimated Creatinine Clearance: 34.3 mL/min (A) (by C-G formula based on SCr of 1.42 mg/dL (H)).    Assessment: Patient on warfarin PTA for PAF. Patient is being admitted after multiple falls in the last 24 hours. Pharmacy consulted to monitor and dose while inpatient. CBC wnl.  No bleeding noted per nurse. INR this morning is elevated at 3.82, will hold warfarin tonight.   PTA dose: warfarin 1.5 mg daily except for 3 mg once weekly on Mondays  Goal of Therapy:  INR 2-3 Monitor platelets by anticoagulation protocol: Yes   Plan:  Hold warfarin dose tonight for elevated INR Monitor daily INR, CBC, s/sx of bleed  Thank you for allowing [place] to participate in this patients care.  Jalene Mullet, Pharm.D. PGY1 Pharmacy Resident 03/28/2017 8:31 AM Main Pharmacy: [phone]

## 2017-03-28 NOTE — Progress Notes (Signed)
PROGRESS NOTE    Ricardo Perkins  TDD:[id number] DOB: 1922-08-16 DOA: 03/26/2017 PCP: [name], MD    Brief Narrative:  83 year old male presented with generalized weakness. Patient is known to have a history of benign prostatic hypertrophy, bradycardia, carotid artery stenosis, hypertension, atrial fibrillation, and chronic kidney disease. Patient developed worsening weakness, generalized, unable to ambulate, failed outpatient evaluation. On initial physical examination blood pressure 137/58, heart rate 84, temperature 98.3, respiratory rate 20, oxygen saturation 96%. Moist mucous membranes, lungs had bilateral wheezing, no rhonchi or rales, heart S1-S2 present irregularly irregular, no murmurs, rubs or gallops, the abdomen was soft nontender, no lower extremity edema. Chest x-ray with increased lung markings bilaterally, hypoinflation.  Patient was admitted to hospital and diagnosis generalized weakness, failed outpatient therapy.  Assessment & Plan:   Active Problems:   Essential hypertension   Raynaud phenomenon   Postinflammatory pulmonary fibrosis (HCC)   Post herpetic neuralgia   PAROXYSMAL ATRIAL FIBRILLATION   CKD (chronic kidney disease) stage 3, GFR 30-59 ml/min   BPH (benign prostatic hyperplasia)   Congestive dilated cardiomyopathy (HCC)   Solitary pulmonary nodule   Tinnitus   Generalized weakness   Recurrent falls   1. Generalized weakness due to diastolic heart failure decompensation acute on chronic. Noted lower extremity edema and increase lung markings on chest film, per patient's family, patient had worsening lower extremity edema 24 hours before admission. Noted elevated bnp in the ED. Patient takes furosemide only as needed, not following weight. Will resume furosemide IV 20 mg to target negative fluids balance and follow with physical therapy recommendations. Continue amlodipine for blood pressure control, continue metoprolol 25 mg po bid.   2.  Hypertension. Will continue blood pressure control with amlodipine and metoprolol.   3. Paroxysmal atrial fibrillation. Rate controlled, with metoprolol. Anticoagulation with warfarin, INR at  3,8, follow pharmacy protocol.   4. Interstitial lung disease. Continue gentle diuresis, keep negative fluid balance.   5. Chronic kidney disease stage III. Base cr 1,3 to 1,4. Will continue diuresis with furosemide, will follow on renal pane, in am, avoid hypotension or nephrotoxic medications.   DVT prophylaxis: warfarin  Code Status: Full Family Communication:  Disposition Plan: SNF   Consultants:     Procedures:     Antimicrobials:       Subjective: Patient tolerating po well, no nausea or vomiting, has been out of bed with physical therapy, no chest pain or dyspnea.   Objective: Vitals:   03/28/17 0610 03/28/17 0636 03/28/17 0709 03/28/17 0948  BP:    (!) 145/75  Pulse:    80  Resp: (!) 24 (!) 22    Temp:    97.9 F (36.6 C)  TempSrc:    Oral  SpO2: 92% 90% 93% 94%  Weight:      Height:        Intake/Output Summary (Last 24 hours) at 03/28/17 1020 Last data filed at 03/28/17 0300  Gross per 24 hour  Intake             2275 ml  Output              400 ml  Net             1875 ml   Filed Weights   03/26/17 1309 03/27/17 0500  Weight: 83.9 kg (185 lb) 85.3 kg (188 lb 1.6 oz)    Examination:  General: Not in pain or dyspnea, deconditioned Neurology: Awake and alert, non focal  E  ENT: mild pallor, no icterus, oral mucosa moist Cardiovascular: S1-S2 present, rhythmic, no gallops, rubs, or murmurs. No jugular venous distention, trace lower extremity edema. Pulmonary: decreased breath sounds bilaterally at bases, adequate air movement, no wheezing, rhonchi, scattered rales. Gastrointestinal. Abdomen flat, no organomegaly, non tender, no rebound or guarding Skin. No rashes Musculoskeletal: no joint deformities     Data Reviewed: I have personally reviewed  following labs and imaging studies  CBC:  Recent Labs Lab 03/26/17 0013 03/26/17 1314 03/27/17 0513 03/28/17 0453 03/28/17 0920  WBC 5.9 6.6 4.7 5.7 5.7  NEUTROABS 3.7  --   --   --   --   HGB 14.6 14.0 12.2* 12.1* 12.6*  HCT 44.5 43.7 39.2 39.6 40.3  MCV 92.5 92.4 92.7 94.7 93.9  PLT 158 157 144* 163 494   Basic Metabolic Panel:  Recent Labs Lab 03/26/17 0013 03/26/17 1314 03/27/17 0513 03/28/17 0453  NA 143 141 142 144  K 3.6 3.9 3.3* 3.5  CL 109 110 111 112*  CO2 24 24 25 26   GLUCOSE 124* 132* 107* 84  BUN 16 14 13 17   CREATININE 1.35* 1.35* 1.17 1.42*  CALCIUM 9.0 8.7* 8.1* 8.1*  MG  --   --   --  1.9   GFR: Estimated Creatinine Clearance: 34.3 mL/min (A) (by C-G formula based on SCr of 1.42 mg/dL (H)). Liver Function Tests:  Recent Labs Lab 03/27/17 0513  AST 12*  ALT 11*  ALKPHOS 45  BILITOT 1.3*  PROT 5.0*  ALBUMIN 2.9*   No results for input(s): LIPASE, AMYLASE in the last 168 hours. No results for input(s): AMMONIA in the last 168 hours. Coagulation Profile:  Recent Labs Lab 03/26/17 0013 03/27/17 0513 03/28/17 0453  INR 2.82 3.48 3.82   Cardiac Enzymes:  Recent Labs Lab 03/26/17 1723 03/26/17 2006 03/26/17 2249  TROPONINI 0.05* 0.05* 0.05*   BNP (last 3 results) No results for input(s): PROBNP in the last 8760 hours. HbA1C: No results for input(s): HGBA1C in the last 72 hours. CBG:  Recent Labs Lab 03/27/17 0812 03/28/17 0708  GLUCAP 92 102*   Lipid Profile: No results for input(s): CHOL, HDL, LDLCALC, TRIG, CHOLHDL, LDLDIRECT in the last 72 hours. Thyroid Function Tests: No results for input(s): TSH, T4TOTAL, FREET4, T3FREE, THYROIDAB in the last 72 hours. Anemia Panel: No results for input(s): VITAMINB12, FOLATE, FERRITIN, TIBC, IRON, RETICCTPCT in the last 72 hours.    Radiology Studies: I have reviewed all of the imaging during this hospital visit personally     Scheduled Meds: . amLODipine  10 mg Oral  Daily  . finasteride  5 mg Oral Daily  . metoprolol tartrate  25 mg Oral BID  . pantoprazole  40 mg Oral Daily  . predniSONE  10 mg Oral Q breakfast  . sodium chloride flush  3 mL Intravenous Q12H  . tamsulosin  0.4 mg Oral QPC supper  . Warfarin - Pharmacist Dosing Inpatient   Does not apply q1800   Continuous Infusions: . sodium chloride 50 mL/hr at 03/28/17 0300     LOS: 1 day        Kacin Dancy Gerome Apley, MD [facility] Pager [phone]

## 2017-03-28 NOTE — Progress Notes (Signed)
Pt sat with great difficulty but was unable to stand for orthostatic vital signs. O2 85 when sitting and RR 24. O2 increased to 92 when lying. Patient wheezing and using accessory muscles to breathe. Paged Kennon Holter, NP to request breathing treatment. Breathing treatment administered. Pt breathing slightly improved and pt resting. Will continue to monitor.

## 2017-03-29 LAB — PROTIME-INR
INR: 2.43
INR: 2.14
PROTHROMBIN TIME: 26.2 s — AB (ref 11.4–15.2)
Prothrombin Time: 23.8 seconds — ABNORMAL HIGH (ref 11.4–15.2)

## 2017-03-29 LAB — BASIC METABOLIC PANEL
Anion gap: 5 (ref 5–15)
BUN: 12 mg/dL (ref 6–20)
CALCIUM: 8.4 mg/dL — AB (ref 8.9–10.3)
CO2: 28 mmol/L (ref 22–32)
CREATININE: 1.21 mg/dL (ref 0.61–1.24)
Chloride: 109 mmol/L (ref 101–111)
GFR calc non Af Amer: 50 mL/min — ABNORMAL LOW (ref >60)
GFR, EST AFRICAN AMERICAN: 58 mL/min — AB (ref >60)
Glucose, Bld: 95 mg/dL (ref 65–99)
Potassium: 3.2 mmol/L — ABNORMAL LOW (ref 3.5–5.1)
Sodium: 142 mmol/L (ref 135–145)

## 2017-03-29 LAB — GLUCOSE, CAPILLARY: Glucose-Capillary: 89 mg/dL (ref 65–99)

## 2017-03-29 MED ORDER — IPRATROPIUM-ALBUTEROL 0.5-2.5 (3) MG/3ML IN SOLN
3.0000 mL | Freq: Four times a day (QID) | RESPIRATORY_TRACT | Status: DC
  Administered 2017-03-29 – 2017-03-30 (×3): 3 mL via RESPIRATORY_TRACT
  Filled 2017-03-29 (×3): qty 3

## 2017-03-29 MED ORDER — WARFARIN SODIUM 3 MG PO TABS
3.0000 mg | ORAL_TABLET | Freq: Once | ORAL | Status: AC
  Administered 2017-03-29: 3 mg via ORAL
  Filled 2017-03-29: qty 1

## 2017-03-29 MED ORDER — IPRATROPIUM-ALBUTEROL 0.5-2.5 (3) MG/3ML IN SOLN: 3.0000 mL | RESPIRATORY_TRACT | Status: DC | PRN

## 2017-03-29 MED ORDER — FUROSEMIDE 20 MG PO TABS
20.0000 mg | ORAL_TABLET | Freq: Every day | ORAL | Status: DC
  Administered 2017-03-30: 20 mg via ORAL
  Filled 2017-03-29: qty 1

## 2017-03-29 MED ORDER — POTASSIUM CHLORIDE 20 MEQ PO PACK
40.0000 meq | PACK | Freq: Once | ORAL | Status: AC
  Administered 2017-03-29: 40 meq via ORAL
  Filled 2017-03-29: qty 2

## 2017-03-29 MED ORDER — METOPROLOL TARTRATE 12.5 MG HALF TABLET
12.5000 mg | ORAL_TABLET | Freq: Two times a day (BID) | ORAL | Status: DC
  Administered 2017-03-29 – 2017-03-30 (×2): 12.5 mg via ORAL
  Filled 2017-03-29 (×2): qty 1

## 2017-03-29 NOTE — Progress Notes (Signed)
ANTICOAGULATION CONSULT NOTE   Pharmacy Consult for warfarin Indication: atrial fibrillation  No Known Allergies  Patient Measurements: Height: 5\' 10"  (177.8 cm) Weight: 186 lb (84.4 kg) IBW/kg (Calculated) : 73  Vital Signs: Temp: 97.7 F (36.5 C) (09/10 0758) Temp Source: Oral (09/10 0538) BP: 122/61 (09/10 0921) Pulse Rate: 82 (09/10 0921)  Labs:  Recent Labs  03/26/17 1723 03/26/17 2006 03/26/17 2249 03/27/17 0513 03/28/17 0453 03/28/17 0920 03/29/17 0255  HGB  --   --   --  12.2* 12.1* 12.6*  --   HCT  --   --   --  39.2 39.6 40.3  --   PLT  --   --   --  144* 163 154  --   LABPROT  --   --   --  34.7* 37.3*  --  26.2*  INR  --   --   --  3.48 3.82  --  2.43  CREATININE  --   --   --  1.17 1.42*  --  1.21  TROPONINI 0.05* 0.05* 0.05*  --   --   --   --     Estimated Creatinine Clearance: 40.2 mL/min (by C-G formula based on SCr of 1.21 mg/dL).    Assessment: Patient on warfarin PTA for PAF. Patient is being admitted after multiple falls in the last 24 hours. Pharmacy consulted to monitor and dose while inpatient. CBC wnl.  No bleeding noted per nurse. INR this morning has dropped significantly to 2.43, ordered another stat INR to confirm and it was even lower at 2.14. Has been in NSR. No bleeding noted, CBC stable.  PTA dose: warfarin 1.5 mg daily except for 3 mg once weekly on Mondays  Goal of Therapy:  INR 2-3 Monitor platelets by anticoagulation protocol: Yes   Plan:  Give warfarin 3 mg po x 1 now Monitor daily INR, CBC, clinical course, s/sx of bleed, PO intake, DDI   Thank you for allowing [place] to participate in this patients care.  [name], PharmD Clinical phone for 03/29/2017 from 7a-3:30p: [phone] If after 3:30p, please call main pharmacy at: [phone] 03/29/2017 9:33 AM

## 2017-03-29 NOTE — Progress Notes (Signed)
Occupational Therapy Treatment Patient Details Name: Ricardo Perkins MRN: [id number] DOB: [birth year] Today's Date: 03/29/2017    History of present illness 83 yo male with onset of weakness adn FTT sustained a fall onto R knee mult times, has no current recall of falling on it. PMHx:  renal insufficiency, HTN, a-fib, OA, TKA, bradycardia, LE edema, COPD with wheezing, pulm nodule,    OT comments  Pt progressing towards goals. Pt completed bed level grooming ADLs with setup during session. Orthostatic vitals taken this session and reading as follows:   Supine: 137/84 Sitting EOB: 152/82 Standing at 0 min: 151/141 (RN notified) Return to supine (due to high diastolic reading): 096/28.  Pt maintained static sitting EOB with close guard, MinA for static standing. Pt demonstrates increased dyspnea upon sitting EOB and standing with education provided on deep breathing techniques. Will continue to follow acutely and recommend ST SNF stay prior to return home to increase Pt's activity tolerance, safety and independence with ADLs and functional mobility.     Follow Up Recommendations  SNF;Supervision/Assistance - 24 hour    Equipment Recommendations  Other (comment) (to be determined in next venue )          Precautions / Restrictions Precautions Precautions: Fall Precaution Comments: watch 02 Restrictions Weight Bearing Restrictions: No       Mobility Bed Mobility Overal bed mobility: Needs Assistance Bed Mobility: Supine to Sit;Sit to Supine     Supine to sit: Min assist Sit to supine: Min guard   General bed mobility comments: MinA to scoot to EOB upon sitting up, increased time and effort; close guard for safety when returning to supine   Transfers Overall transfer level: Needs assistance Equipment used: Rolling walker (2 wheeled) Transfers: Sit to/from Stand Sit to Stand: Mod assist         General transfer comment: assist to rise and steady, verbal cues for hand  placement     Balance Overall balance assessment: History of Falls;Needs assistance Sitting-balance support: Feet supported;Single extremity supported Sitting balance-Leahy Scale: Fair Sitting balance - Comments: static sitting EOB    Standing balance support: Bilateral upper extremity supported;During functional activity Standing balance-Leahy Scale: Poor Standing balance comment: MinA from therapist to maintain standing balance                            ADL either performed or assessed with clinical judgement   ADL Overall ADL's : Needs assistance/impaired     Grooming: Wash/dry face;Sitting;Set up;Bed level                               Functional mobility during ADLs: Minimal assistance;Rolling walker General ADL Comments: Orthostatics taken during session; Pt with increased dyspnea upon sitting EOB and standing at EOB, O2 sats monitored, dropping to 88% on 5L O2, returned to above 90% with deep breathing techniques; Pt returned to supine after standing BP taken due to increased diastolic (366/294), BP 765/46 after return to supine; RN aware                       Cognition Arousal/Alertness: Awake/alert Behavior During Therapy: WFL for tasks assessed/performed Overall Cognitive Status: Within Functional Limits for tasks assessed  Pertinent Vitals/ Pain       Faces Pain Scale: Hurts little more Pain Location: R knee  Pain Descriptors / Indicators: Tender;Sore Pain Intervention(s): Limited activity within patient's tolerance;Monitored during session;Repositioned                                                          Frequency  Min 2X/week        Progress Toward Goals  OT Goals(current goals can now be found in the care plan section)  Progress towards OT goals: Progressing toward goals  Acute Rehab OT Goals Patient Stated  Goal: to make his R knee pain better OT Goal Formulation: With patient Time For Goal Achievement: 04/10/17 Potential to Achieve Goals: Good  Plan Discharge plan remains appropriate    AM-PAC PT "6 Clicks" Daily Activity     Outcome Measure   Help from another person eating meals?: None Help from another person taking care of personal grooming?: A Little Help from another person toileting, which includes using toliet, bedpan, or urinal?: A Lot Help from another person bathing (including washing, rinsing, drying)?: A Lot Help from another person to put on and taking off regular upper body clothing?: None Help from another person to put on and taking off regular lower body clothing?: A Lot 6 Click Score: 17    End of Session Equipment Utilized During Treatment: Gait belt;Rolling walker  OT Visit Diagnosis: Unsteadiness on feet (R26.81);Muscle weakness (generalized) (M62.81);History of falling (Z91.81);Pain;Other abnormalities of gait and mobility (R26.89) Pain - Right/Left: Right Pain - part of body: Knee   Activity Tolerance Patient limited by fatigue   Patient Left in bed;with call bell/phone within reach;with bed alarm set;with family/visitor present   Nurse Communication Mobility status;Other (comment) (BP vitals)        Time: 5825-1898 OT Time Calculation (min): 19 min  Charges: OT General Charges $OT Visit: 1 Visit OT Treatments $Therapeutic Activity: 8-22 mins  Lou Cal, [place] Pager [phone] 03/29/2017    Ricardo Perkins 03/29/2017, 10:19 AM

## 2017-03-29 NOTE — Progress Notes (Signed)
CSW received consult regarding PT recommendation of SNF at discharge.  Patient and family refusing SNF. Patient's daughter states patient will "kill himself" if he has to go to a SNF. She states that she has ordered a bed that will allow him to reposition himself and has other equipment as well at home. She is planning on hiring someone to sit with patient while she is at work during the day. She wants a home health RN to weigh patient and take his bp. She also reports that he will need a bedside commode and a nebulizer.   Patient's daughter states that patient will not be able to be discharged from the hospital until patient walks. Pt has not seen the patient for a few days. Patient's daughter is upset that patient was sent home during his first ED admission and had to come back hours later with CHF. She will appeal Medicare if patient is discharged before she feels he is ready. CSW will alert RNCM.  CSW signing off.   Percell Locus Clerence Gubser LCSWA [phone]

## 2017-03-29 NOTE — Progress Notes (Signed)
PROGRESS NOTE    Ricardo Perkins  FIE:[id number] DOB: 10-11-1922 DOA: 03/26/2017 PCP: [name], MD    Brief Narrative:  83 year old male presented with generalized weakness. Patient is known to have a history of benign prostatic hypertrophy, bradycardia, carotid artery stenosis, hypertension, atrial fibrillation, and chronic kidney disease. Patient developed worsening weakness, generalized, unable to ambulate, failed outpatient evaluation. On initial physical examination blood pressure 137/58, heart rate 84, temperature 98.3, respiratory rate 20, oxygen saturation 96%. Moist mucous membranes, lungs had bilateral wheezing, no rhonchi or rales, heart S1-S2 present irregularly irregular, no murmurs, rubs or gallops, the abdomen was soft nontender, no lower extremity edema. Chest x-ray with increased lung markings bilaterally, hypoinflation.  Patient was admitted to hospital and diagnosis generalized weakness, failed outpatient therapy.   Assessment & Plan:   Active Problems:   Essential hypertension   Raynaud phenomenon   Postinflammatory pulmonary fibrosis (HCC)   Post herpetic neuralgia   PAROXYSMAL ATRIAL FIBRILLATION   CKD (chronic kidney disease) stage 3, GFR 30-59 ml/min   BPH (benign prostatic hyperplasia)   Congestive dilated cardiomyopathy (HCC)   Solitary pulmonary nodule   Tinnitus   Generalized weakness   Recurrent falls   1. Generalized weakness due to diastolic heart failure decompensation acute on chronic. Patient responding well to diuresis with furosemide, 20 mg IV, urine output 3,600 over last 24 hours, will transition to oral furosemide and continue to keep negative fluid balance. Will continue metoprolol, holding on ace inh until renal function more stable. Follow up echocardiogram with left ventricle hypertrophy, ejection fraction 60 to 65%. Then dilated IVC and PA peak pressure 64.    2. Hypertension. Systolic blood pressure 166 to 150, continue with  amlodipine and metoprolol.   3. Paroxysmal atrial fibrillation. Rate continue to be well controlled, with metoprolol. Warfarin, for anticoagulation with INR at  2.14, dosing per pharmacy protocol.   4. Interstitial lung disease, possible copd with pulmonary HTN. Patient on supplemental oxygen at home continuosly 4 LPM, will add bronchodilator therapy with duoneb, and will continue gentle diuresis, to keep negative fluid balance. Continue home dose of prednisone.   5. Chronic kidney disease stage III. Serum cr down to 1,21, with K at 3,2 and serum bicarbonate at 28, will change furosemide to po and continue to monitor urine output, keep negative fluids balance. Will replete K and follow renal panel in am, avoid hypotension or nephrotoxic medications.   DVT prophylaxis: warfarin  Code Status: Full Family Communication:  Disposition Plan: SNF   Consultants:     Procedures:     Antimicrobials:      Subjective: Patient reports feeling better, has been noticed to have wheezing, no chest pain, no nausea or vomiting, poor appetite and persistent generalized weakness.   Objective: Vitals:   03/29/17 0500 03/29/17 0538 03/29/17 0758 03/29/17 0921  BP:  (!) 154/87 (!) 152/82 122/61  Pulse:  69 (!) 55 82  Resp:  18 18   Temp:  98.2 F (36.8 C) 97.7 F (36.5 C)   TempSrc:  Oral    SpO2:  92% 92%   Weight: 84.4 kg (186 lb)     Height:        Intake/Output Summary (Last 24 hours) at 03/29/17 1059 Last data filed at 03/29/17 0823  Gross per 24 hour  Intake                0 ml  Output  3800 ml  Net            -3800 ml   Filed Weights   03/26/17 1309 03/27/17 0500 03/29/17 0500  Weight: 83.9 kg (185 lb) 85.3 kg (188 lb 1.6 oz) 84.4 kg (186 lb)    Examination:  General: deconditioned Neurology: Awake and alert, non focal  E ENT: mild pallor, no icterus, oral mucosa moist Cardiovascular: No JVD, S1-S2 present, rhythmic, no gallops, rubs, or murmurs. No  jugular venous distention, trace lower extremity edema. Pulmonary: decreased breath sounds bilaterally at bases, adequate air movement, no rhonchi or rales. Mild expiratory wheezing at apical zones bilaterally.  Gastrointestinal. Abdomen flat, no organomegaly, non tender, no rebound or guarding Skin. No rashes Musculoskeletal: no joint deformities     Data Reviewed: I have personally reviewed following labs and imaging studies  CBC:  Recent Labs Lab 03/26/17 0013 03/26/17 1314 03/27/17 0513 03/28/17 0453 03/28/17 0920  WBC 5.9 6.6 4.7 5.7 5.7  NEUTROABS 3.7  --   --   --   --   HGB 14.6 14.0 12.2* 12.1* 12.6*  HCT 44.5 43.7 39.2 39.6 40.3  MCV 92.5 92.4 92.7 94.7 93.9  PLT 158 157 144* 163 409   Basic Metabolic Panel:  Recent Labs Lab 03/26/17 0013 03/26/17 1314 03/27/17 0513 03/28/17 0453 03/29/17 0255  NA 143 141 142 144 142  K 3.6 3.9 3.3* 3.5 3.2*  CL 109 110 111 112* 109  CO2 24 24 25 26 28   GLUCOSE 124* 132* 107* 84 95  BUN 16 14 13 17 12   CREATININE 1.35* 1.35* 1.17 1.42* 1.21  CALCIUM 9.0 8.7* 8.1* 8.1* 8.4*  MG  --   --   --  1.9  --    GFR: Estimated Creatinine Clearance: 40.2 mL/min (by C-G formula based on SCr of 1.21 mg/dL). Liver Function Tests:  Recent Labs Lab 03/27/17 0513  AST 12*  ALT 11*  ALKPHOS 45  BILITOT 1.3*  PROT 5.0*  ALBUMIN 2.9*   No results for input(s): LIPASE, AMYLASE in the last 168 hours. No results for input(s): AMMONIA in the last 168 hours. Coagulation Profile:  Recent Labs Lab 03/26/17 0013 03/27/17 0513 03/28/17 0453 03/29/17 0255  INR 2.82 3.48 3.82 2.43   Cardiac Enzymes:  Recent Labs Lab 03/26/17 1723 03/26/17 2006 03/26/17 2249  TROPONINI 0.05* 0.05* 0.05*   BNP (last 3 results) No results for input(s): PROBNP in the last 8760 hours. HbA1C: No results for input(s): HGBA1C in the last 72 hours. CBG:  Recent Labs Lab 03/27/17 0812 03/28/17 0708 03/29/17 0719  GLUCAP 92 102* 89    Lipid Profile: No results for input(s): CHOL, HDL, LDLCALC, TRIG, CHOLHDL, LDLDIRECT in the last 72 hours. Thyroid Function Tests: No results for input(s): TSH, T4TOTAL, FREET4, T3FREE, THYROIDAB in the last 72 hours. Anemia Panel: No results for input(s): VITAMINB12, FOLATE, FERRITIN, TIBC, IRON, RETICCTPCT in the last 72 hours.    Radiology Studies: I have reviewed all of the imaging during this hospital visit personally     Scheduled Meds: . amLODipine  10 mg Oral Daily  . finasteride  5 mg Oral Daily  . furosemide  20 mg Intravenous Daily  . metoprolol tartrate  25 mg Oral BID  . pantoprazole  40 mg Oral Daily  . predniSONE  10 mg Oral Q breakfast  . sodium chloride flush  3 mL Intravenous Q12H  . tamsulosin  0.4 mg Oral QPC supper  . Warfarin - Pharmacist Dosing Inpatient  Does not apply q1800   Continuous Infusions:   LOS: 2 days        Tawni Millers, MD [facility] Pager [phone]

## 2017-03-29 NOTE — Progress Notes (Signed)
PT Cancellation Note  Patient Details Name: NOELL SHULAR MRN: [id number] DOB: [birth year]   Cancelled Treatment:    Reason Eval/Treat Not Completed: Patient declined, no reason specified Pt just got his dinner and requested for PT to come back tomorrow. Will reattempt as schedule allows.   Leighton Ruff, PT, DPT  Acute Rehabilitation Services  Pager: [phone]    Rudean Hitt 03/29/2017, 6:10 PM

## 2017-03-29 NOTE — Consult Note (Signed)
[facility] CM Primary Care Navigator  03/30/2017  Ricardo Perkins 08/31/1922 [id number]   Met withpatientat the bedside to identify possible discharge needs.  Patient reports having recurrent falls due to dizziness and increasing generalized weakness that had led to this admission. Patient endorses Ricardo Perkins with [facility] as the primary care provider.   [facility] [address] [facility].   He reports managinghis medications at home with use of "pill box" system filled weekly.  Patient verbalized that daughter (Ricardo Perkins) and husband (Ricardo Perkins) were providingtransportation to hisdoctors'appointments.  Patient reports living with daughter who serves as his primary caregiver at home. Patient reports that his daughter will be hiring sitter to look after him while she is at work.  Discharge plan is home according to patient. Per Inpatient social worker note, patient and family refusing skilled nursing facility (SNF) . Inpatient Care Management notified of options and follow-up needs as appropriate.  Patientexpressedunderstanding to call primary care provider's officewhen he gets home,for a post discharge follow-up appointment within a week or sooner if needed.Patient letter (with PCP's contact number) was provided as areminder.  Explained to patient about [facility] CM services available for health management at home but he states "not having issues so far since daughter is helping me out". Although, he would only opt and verbally agree to EMMI calls for follow-up with his recovery at home.   Referral made to EMMI General Calls after discharge.  Patient voiced  understanding to seekreferral to [facility] care management servicesfrom primary care provider if necessary and appropriate in the near future.   [facility] care management information provided for future needs that may arise.   For  questions, please contact:  Ricardo Perkins, BSN, RN- [facility] Primary Care Navigator  Telephone: [phone] [facility] 

## 2017-03-30 ENCOUNTER — Telehealth: Payer: Self-pay | Admitting: Cardiology

## 2017-03-30 LAB — BASIC METABOLIC PANEL
Anion gap: 7 (ref 5–15)
BUN: 13 mg/dL (ref 6–20)
CO2: 29 mmol/L (ref 22–32)
CREATININE: 1.25 mg/dL — AB (ref 0.61–1.24)
Calcium: 8.3 mg/dL — ABNORMAL LOW (ref 8.9–10.3)
Chloride: 105 mmol/L (ref 101–111)
GFR, EST AFRICAN AMERICAN: 56 mL/min — AB (ref >60)
GFR, EST NON AFRICAN AMERICAN: 48 mL/min — AB (ref >60)
Glucose, Bld: 105 mg/dL — ABNORMAL HIGH (ref 65–99)
Potassium: 2.8 mmol/L — ABNORMAL LOW (ref 3.5–5.1)
SODIUM: 141 mmol/L (ref 135–145)

## 2017-03-30 LAB — CBC WITH DIFFERENTIAL/PLATELET
BASOS ABS: 0 10*3/uL (ref 0.0–0.1)
BASOS PCT: 0 %
EOS PCT: 2 %
Eosinophils Absolute: 0.1 10*3/uL (ref 0.0–0.7)
HCT: 40.1 % (ref 39.0–52.0)
Hemoglobin: 12.9 g/dL — ABNORMAL LOW (ref 13.0–17.0)
Lymphocytes Relative: 21 %
Lymphs Abs: 1.1 10*3/uL (ref 0.7–4.0)
MCH: 29.8 pg (ref 26.0–34.0)
MCHC: 32.2 g/dL (ref 30.0–36.0)
MCV: 92.6 fL (ref 78.0–100.0)
Monocytes Absolute: 0.6 10*3/uL (ref 0.1–1.0)
Monocytes Relative: 12 %
Neutro Abs: 3.4 10*3/uL (ref 1.7–7.7)
Neutrophils Relative %: 65 %
PLATELETS: 177 10*3/uL (ref 150–400)
RBC: 4.33 MIL/uL (ref 4.22–5.81)
RDW: 15.3 % (ref 11.5–15.5)
WBC: 5.3 10*3/uL (ref 4.0–10.5)

## 2017-03-30 LAB — PROTIME-INR
INR: 2.22
Prothrombin Time: 24.4 seconds — ABNORMAL HIGH (ref 11.4–15.2)

## 2017-03-30 LAB — GLUCOSE, CAPILLARY: Glucose-Capillary: 105 mg/dL — ABNORMAL HIGH (ref 65–99)

## 2017-03-30 MED ORDER — POTASSIUM CHLORIDE CRYS ER 20 MEQ PO TBCR: 20.0000 meq | EXTENDED_RELEASE_TABLET | Freq: Every day | ORAL | 0 refills | Status: DC

## 2017-03-30 MED ORDER — WARFARIN SODIUM 1 MG PO TABS
1.5000 mg | ORAL_TABLET | Freq: Once | ORAL | Status: DC
  Filled 2017-03-30: qty 1

## 2017-03-30 MED ORDER — FUROSEMIDE 20 MG PO TABS: 20.0000 mg | ORAL_TABLET | Freq: Every day | ORAL | 0 refills | Status: DC

## 2017-03-30 MED ORDER — METOPROLOL TARTRATE 25 MG PO TABS: 12.5000 mg | ORAL_TABLET | Freq: Two times a day (BID) | ORAL | 0 refills | Status: DC

## 2017-03-30 MED ORDER — POTASSIUM CHLORIDE 10 MEQ/100ML IV SOLN
10.0000 meq | INTRAVENOUS | Status: AC
  Administered 2017-03-30 (×3): 10 meq via INTRAVENOUS
  Filled 2017-03-30 (×3): qty 100

## 2017-03-30 MED ORDER — IPRATROPIUM-ALBUTEROL 0.5-2.5 (3) MG/3ML IN SOLN
3.0000 mL | Freq: Three times a day (TID) | RESPIRATORY_TRACT | Status: DC
  Administered 2017-03-30: 3 mL via RESPIRATORY_TRACT
  Filled 2017-03-30 (×2): qty 3

## 2017-03-30 MED ORDER — POTASSIUM CHLORIDE CRYS ER 20 MEQ PO TBCR
40.0000 meq | EXTENDED_RELEASE_TABLET | ORAL | Status: AC
  Administered 2017-03-30 (×2): 40 meq via ORAL
  Filled 2017-03-30 (×2): qty 2

## 2017-03-30 NOTE — Consult Note (Signed)
   [facility] CM Inpatient Consult   03/30/2017  ARTHA CHIASSON Feb 05, 1923 [id number]    Made aware of [facility] First referral. Spoke with Tommi Rumps with [facility] to speak with daughter, Jocelyn Lamer.   Went to bedside to speak with Jocelyn Lamer and Mr. Augustin Coupe. Explained that [facility] works with [place] program if there are pharmacy or transportation needs.   Written consent obtained for [facility] Care Management program potential services.   Made [place] and inpatient [facility] aware of bedside visit.   Marthenia Rolling, MSN-Ed, RN,BSN [facility] Liaison [phone]

## 2017-03-30 NOTE — Discharge Summary (Signed)
Physician Discharge Summary  Ricardo Perkins  ELF:[id number]  DOB: 1923/04/04  DOA: 03/26/2017 PCP: [name], MD  Admit date: 03/26/2017 Discharge date: 03/30/2017  Admitted From: Home  Disposition: Home   Recommendations for Outpatient Follow-up:  1. Follow up with PCP in 1 weeks 2. Follow up with cardiology in 1-2 weeks  3. Please obtain BMP/CBC in one week to monitor potassium, Creatinine and Hgb.  Home Health: PT/OT  Equipment/Devices: Rolling walker    Discharge Condition: Stable  CODE STATUS: DRN  Diet recommendation: Heart Healthy   Brief/Interim Summary: Ricardo Perkins is a 83 y/o M with PMHx of BPH, HTN, Afib, CKD, and carotid stenosis, presented to the ED c/o generalized weakness, and multiple fall, on admission unclear if syncope, although patient did not have LOC. Patient was admitted for PT evaluation and r/o potential causes of syncope.  Subjective: Patient seen and examined with daughter at bedside, patient reported doing well has no complaints this morning. Denies chest pain, short of breath, palpitations. Weakness has been improving. Able to participate with physical therapy.  Discharge Diagnoses/Hospital Course:  Generalize weakness due to decompensated diastolic heart failure Initial thought was secondary to dehydration was treated with IV fluids, subsequently patient with signs of fluid overload. Patient responded well to IV diuresis, urine output ~ 4 L in 48 hours. Follow-up echocardiogram with left ventricular hypertrophy EF of 58-52%, grade 2 diastolic dysfunction, dilated IVC and a PA pressure of 64. Patient will continue oral Lasix daily. Continue metoprolol and resume ACE inhibitor as renal function is stable. Evaluated by physical therapy who recommended SNF for STR but family declined.  Hypertension Blood pressure stable during hospital stay Continue home blood pressure medication with no change  Paroxysmal atrial fibrillation Rate continue to  be well controlled with metoprolol Continue warfarin for anticoagulation, INR at goal.  Pulmonary fibrosis On home oxygen, tinea slowly 4 L Patient was treated with duo neb Home dose percent insulin was continued  Chronic kidney disease stage III  Serum creatinine seems to be at baseline Monitor BMP in one week  Hypokalemia - secondary to Lasix Repleted with IV infusion Will send oral prescription as patient will continue on oral Lasix for now.  All other chronic medical condition were stable during the hospitalization.  Patient was seen by physical therapy, recommending SNF but family and patient declined, will go home with home health PT  On the day of the discharge the patient's vitals were stable, and no other acute medical condition were reported by patient. Patient was felt safe to be discharge to home.   Discharge Instructions  You were cared for by a hospitalist during your hospital stay. If you have any questions about your discharge medications or the care you received while you were in the hospital after you are discharged, you can call the unit and asked to speak with the hospitalist on call if the hospitalist that took care of you is not available. Once you are discharged, your primary care physician will handle any further medical issues. Please note that NO REFILLS for any discharge medications will be authorized once you are discharged, as it is imperative that you return to your primary care physician (or establish a relationship with a primary care physician if you do not have one) for your aftercare needs so that they can reassess your need for medications and monitor your lab values.  Discharge Instructions    Call MD for:  difficulty breathing, headache or visual disturbances  Complete by:  As directed    Call MD for:  extreme fatigue    Complete by:  As directed    Call MD for:  hives    Complete by:  As directed    Call MD for:  persistant dizziness or  light-headedness    Complete by:  As directed    Call MD for:  persistant nausea and vomiting    Complete by:  As directed    Call MD for:  redness, tenderness, or signs of infection (pain, swelling, redness, odor or green/yellow discharge around incision site)    Complete by:  As directed    Call MD for:  severe uncontrolled pain    Complete by:  As directed    Call MD for:  temperature >100.4    Complete by:  As directed    Diet - low sodium heart healthy    Complete by:  As directed    Increase activity slowly    Complete by:  As directed      Allergies as of 03/30/2017   No Known Allergies     Medication List    TAKE these medications   amLODipine 10 MG tablet Commonly known as:  NORVASC TAKE 1 TABLET BY MOUTH EVERY DAY   finasteride 5 MG tablet Commonly known as:  PROSCAR TAKE 1 TABLET(5 MG) BY MOUTH DAILY   furosemide 20 MG tablet Commonly known as:  LASIX Take 1 tablet (20 mg total) by mouth daily. What changed:  medication strength  how much to take  when to take this  reasons to take this   lidocaine 5 % Commonly known as:  LIDODERM Place 1 patch onto the skin daily. Remove & Discard patch within 12 hours or as directed by MD What changed:  when to take this  reasons to take this  additional instructions   metoprolol tartrate 25 MG tablet Commonly known as:  LOPRESSOR Take 0.5 tablets (12.5 mg total) by mouth 2 (two) times daily. What changed:  See the new instructions.   omeprazole 20 MG capsule Commonly known as:  PRILOSEC TAKE ONE CAPSULE BY MOUTH EVERY DAY AS NEEDED   potassium chloride SA 20 MEQ tablet Commonly known as:  K-DUR,KLOR-CON Take 1 tablet (20 mEq total) by mouth daily.   predniSONE 10 MG tablet Commonly known as:  DELTASONE Take 1 tablet (10 mg total) by mouth daily with breakfast. Annual appt is due  must see provider for future refills   tamsulosin 0.4 MG Caps capsule Commonly known as:  FLOMAX Take 1 capsule (0.4 mg  total) by mouth daily after supper. --- Pt needs appt for further refills   temazepam 30 MG capsule Commonly known as:  RESTORIL TAKE 1 CAPSULE BY MOUTH EVERY DAY AT BEDTIME   traMADol 50 MG tablet Commonly known as:  ULTRAM TAKE 1 TO 2 TABLETS BY MOUTH EVERY 6 HOURS AS NEEDED   warfarin 3 MG tablet Commonly known as:  COUMADIN AS DIRECTED What changed:  how much to take  how to take this  when to take this  additional instructions            Discharge Care Instructions        Start     Ordered   03/30/17 0000  furosemide (LASIX) 20 MG tablet  Daily     03/30/17 1127   03/30/17 0000  metoprolol tartrate (LOPRESSOR) 25 MG tablet  2 times daily     03/30/17 1127  03/30/17 0000  potassium chloride SA (K-DUR,KLOR-CON) 20 MEQ tablet  Daily     03/30/17 1127   03/30/17 0000  Increase activity slowly     03/30/17 1127   03/30/17 0000  Diet - low sodium heart healthy     03/30/17 1127   03/30/17 0000  Call MD for:  temperature >100.4     03/30/17 1127   03/30/17 0000  Call MD for:  persistant nausea and vomiting     03/30/17 1127   03/30/17 0000  Call MD for:  severe uncontrolled pain     03/30/17 1127   03/30/17 0000  Call MD for:  redness, tenderness, or signs of infection (pain, swelling, redness, odor or green/yellow discharge around incision site)     03/30/17 1127   03/30/17 0000  Call MD for:  difficulty breathing, headache or visual disturbances     03/30/17 1127   03/30/17 0000  Call MD for:  hives     03/30/17 1127   03/30/17 0000  Call MD for:  persistant dizziness or light-headedness     03/30/17 1127   03/30/17 0000  Call MD for:  extreme fatigue     03/30/17 1127     Follow-up Information    [name], MD. Schedule an appointment as soon as possible for a visit in 1 week(s).   Specialty:  Internal Medicine Why:  hospital follow up  Contact information: [place] [address] [phone]          No Known  Allergies  Consultations:  None   Procedures/Studies: X-ray Chest Pa And Lateral  Result Date: 03/27/2017 CLINICAL DATA:  Wheezing. EXAM: CHEST  2 VIEW COMPARISON:  Radiographs of March 26, 2017. FINDINGS: Stable cardiomegaly. No pneumothorax or pleural effusion is noted. Stable elevated right hemidiaphragm is noted. Mild bibasilar subsegmental atelectasis is noted. Bony thorax is unremarkable. IMPRESSION: Mild bibasilar subsegmental atelectasis. Electronically Signed   By: Marijo Conception, M.D.   On: 03/27/2017 09:17   Dg Chest 2 View  Result Date: 03/26/2017 CLINICAL DATA:  Near syncope. EXAM: CHEST  2 VIEW COMPARISON:  Chest CT 07/26/2015.  Radiograph 04/22/2015 FINDINGS: Lower lung volumes from prior exam. Stable heart size allowing for differences in technique. Unchanged mediastinal contours with tortuosity of the thoracic aorta. Emphysematous changes on CT are not as well demonstrated radiographically. No confluent consolidation, pleural fluid or pneumothorax. No evidence of pulmonary edema. Streaky scarring in the right middle and lower lobes. IMPRESSION: No radiographic evidence of acute abnormality. Stable tortuous aorta and lung scarring. Electronically Signed   By: Jeb Levering M.D.   On: 03/26/2017 01:08   Ct Head Wo Contrast  Result Date: 03/26/2017 CLINICAL DATA:  Head trauma, fall EXAM: CT HEAD WITHOUT CONTRAST CT CERVICAL SPINE WITHOUT CONTRAST TECHNIQUE: Multidetector CT imaging of the head and cervical spine was performed following the standard protocol without intravenous contrast. Multiplanar CT image reconstructions of the cervical spine were also generated. COMPARISON:  01/20/2016 FINDINGS: CT HEAD FINDINGS Brain: Generalized atrophy. Normal ventricular morphology. No midline shift or mass effect. Small vessel chronic ischemic changes of deep cerebral white matter. No intracranial hemorrhage, mass lesion, evidence of acute infarction, or extra-axial fluid collection.  Vascular: Atherosclerotic calcification of internal carotid arteries at skullbase Skull: Intact Sinuses/Orbits: Clear Other: N/A CT CERVICAL SPINE FINDINGS Alignment: Minimal retrolisthesis at C2-C3 and anterolisthesis at C4-C5, unchanged. Remaining alignments normal Skull base and vertebrae: Skullbase intact. Mild osseous demineralization diffusely. Scattered multilevel facet  degenerative changes. Vertebral body heights maintained without fracture or bone destruction. Soft tissues and spinal canal: Prevertebral soft tissues normal thickness. Regional soft tissues unremarkable. Disc levels: Multilevel disc space narrowing. No gross disc herniation. Upper chest: Respiratory motion artifacts at lung apices. Other: Atherosclerotic calcification aortic arch. IMPRESSION: Atrophy with small vessel chronic ischemic changes of deep cerebral white matter. No acute intracranial abnormalities. Multilevel degenerative disc and facet disease changes of the cervical spine. No acute cervical spine abnormalities. Aortic Atherosclerosis (ICD10-I70.0). Electronically Signed   By: Lavonia Dana M.D.   On: 03/26/2017 16:52   Ct Cervical Spine Wo Contrast  Result Date: 03/26/2017 CLINICAL DATA:  Head trauma, fall EXAM: CT HEAD WITHOUT CONTRAST CT CERVICAL SPINE WITHOUT CONTRAST TECHNIQUE: Multidetector CT imaging of the head and cervical spine was performed following the standard protocol without intravenous contrast. Multiplanar CT image reconstructions of the cervical spine were also generated. COMPARISON:  01/20/2016 FINDINGS: CT HEAD FINDINGS Brain: Generalized atrophy. Normal ventricular morphology. No midline shift or mass effect. Small vessel chronic ischemic changes of deep cerebral white matter. No intracranial hemorrhage, mass lesion, evidence of acute infarction, or extra-axial fluid collection. Vascular: Atherosclerotic calcification of internal carotid arteries at skullbase Skull: Intact Sinuses/Orbits: Clear Other: N/A CT  CERVICAL SPINE FINDINGS Alignment: Minimal retrolisthesis at C2-C3 and anterolisthesis at C4-C5, unchanged. Remaining alignments normal Skull base and vertebrae: Skullbase intact. Mild osseous demineralization diffusely. Scattered multilevel facet degenerative changes. Vertebral body heights maintained without fracture or bone destruction. Soft tissues and spinal canal: Prevertebral soft tissues normal thickness. Regional soft tissues unremarkable. Disc levels: Multilevel disc space narrowing. No gross disc herniation. Upper chest: Respiratory motion artifacts at lung apices. Other: Atherosclerotic calcification aortic arch. IMPRESSION: Atrophy with small vessel chronic ischemic changes of deep cerebral white matter. No acute intracranial abnormalities. Multilevel degenerative disc and facet disease changes of the cervical spine. No acute cervical spine abnormalities. Aortic Atherosclerosis (ICD10-I70.0). Electronically Signed   By: Lavonia Dana M.D.   On: 03/26/2017 16:52   Dg Knee Complete 4 Views Right  Result Date: 03/26/2017 CLINICAL DATA:  Right knee pain for 2 months history of for placement EXAM: RIGHT KNEE - COMPLETE 4+ VIEW COMPARISON:  None. FINDINGS: Patient is status post right knee arthroplasty. Hardware demonstrates normal alignment and appears intact. No fracture is seen. No significant joint effusion. Popliteal vascular calcification. IMPRESSION: 1. Status post right knee replacement without acute osseous abnormality 2. Vascular calcification in the popliteal fossa Electronically Signed   By: Donavan Foil M.D.   On: 03/26/2017 14:11   ECHO 03/28/17 ------------------------------------------------------------------- Study Conclusions  - Left ventricle: The cavity size was normal. Wall thickness was   increased in a pattern of mild LVH. Systolic function was normal.   The estimated ejection fraction was in the range of 60% to 65%.   Wall motion was normal; there were no regional wall  motion   abnormalities. Features are consistent with a pseudonormal left   ventricular filling pattern, with concomitant abnormal relaxation   and increased filling pressure (grade 2 diastolic dysfunction).   Doppler parameters are consistent with high ventricular filling   pressure. - Aortic valve: Mildly calcified annulus. Trileaflet. There was   mild regurgitation. - Aorta: Mild ascending thoracic aortic dilatation. Diameter 4 cm. - Tricuspid valve: There was moderate regurgitation. - Pulmonary arteries: PA peak pressure: 64 mm Hg (S). - Systemic veins: IVC is dilated with normal respiratory variation.   Estimated CVP 8 mmHg.   Discharge Exam: Vitals:   03/30/17 1093  03/30/17 1042  BP:    Pulse:  68  Resp:    Temp:    SpO2: 92%    Vitals:   03/30/17 0308 03/30/17 0550 03/30/17 0812 03/30/17 1042  BP:  118/74    Pulse: 87 66  68  Resp: 18     Temp:  98.4 F (36.9 C)    TempSrc:  Oral    SpO2: 90% 91% 92%   Weight:      Height:        General: Pt is alert, awake, not in acute distress Cardiovascular: RRR, S1/S2 +, no rubs, no gallops, No JVD  Respiratory: Good air entry, mild bibasilar crackles. No wheezing  Abdominal: Soft, NT, ND, bowel sounds + Extremities: LE trace edema b/l   The results of significant diagnostics from this hospitalization (including imaging, microbiology, ancillary and laboratory) are listed below for reference.     Microbiology: No results found for this or any previous visit (from the past 240 hour(s)).   Labs: BNP (last 3 results)  Recent Labs  03/26/17 1723  BNP 716.9*   Basic Metabolic Panel:  Recent Labs Lab 03/26/17 1314 03/27/17 0513 03/28/17 0453 03/29/17 0255 03/30/17 0706  NA 141 142 144 142 141  K 3.9 3.3* 3.5 3.2* 2.8*  CL 110 111 112* 109 105  CO2 24 25 26 28 29   GLUCOSE 132* 107* 84 95 105*  BUN 14 13 17 12 13   CREATININE 1.35* 1.17 1.42* 1.21 1.25*  CALCIUM 8.7* 8.1* 8.1* 8.4* 8.3*  MG  --   --  1.9  --    --    Liver Function Tests:  Recent Labs Lab 03/27/17 0513  AST 12*  ALT 11*  ALKPHOS 45  BILITOT 1.3*  PROT 5.0*  ALBUMIN 2.9*   No results for input(s): LIPASE, AMYLASE in the last 168 hours. No results for input(s): AMMONIA in the last 168 hours. CBC:  Recent Labs Lab 03/26/17 0013 03/26/17 1314 03/27/17 0513 03/28/17 0453 03/28/17 0920 03/30/17 0706  WBC 5.9 6.6 4.7 5.7 5.7 5.3  NEUTROABS 3.7  --   --   --   --  3.4  HGB 14.6 14.0 12.2* 12.1* 12.6* 12.9*  HCT 44.5 43.7 39.2 39.6 40.3 40.1  MCV 92.5 92.4 92.7 94.7 93.9 92.6  PLT 158 157 144* 163 154 177   Cardiac Enzymes:  Recent Labs Lab 03/26/17 1723 03/26/17 2006 03/26/17 2249  TROPONINI 0.05* 0.05* 0.05*   BNP: Invalid input(s): POCBNP CBG:  Recent Labs Lab 03/27/17 0812 03/28/17 0708 03/29/17 0719 03/30/17 0747  GLUCAP 92 102* 89 105*   D-Dimer No results for input(s): DDIMER in the last 72 hours. Hgb A1c No results for input(s): HGBA1C in the last 72 hours. Lipid Profile No results for input(s): CHOL, HDL, LDLCALC, TRIG, CHOLHDL, LDLDIRECT in the last 72 hours. Thyroid function studies No results for input(s): TSH, T4TOTAL, T3FREE, THYROIDAB in the last 72 hours.  Invalid input(s): FREET3 Anemia work up No results for input(s): VITAMINB12, FOLATE, FERRITIN, TIBC, IRON, RETICCTPCT in the last 72 hours. Urinalysis    Component Value Date/Time   COLORURINE YELLOW 03/26/2017 0118   APPEARANCEUR CLEAR 03/26/2017 0118   LABSPEC 1.019 03/26/2017 0118   PHURINE 5.0 03/26/2017 0118   GLUCOSEU NEGATIVE 03/26/2017 0118   HGBUR NEGATIVE 03/26/2017 0118   BILIRUBINUR NEGATIVE 03/26/2017 0118   KETONESUR NEGATIVE 03/26/2017 0118   PROTEINUR 30 (A) 03/26/2017 0118   UROBILINOGEN 0.2 02/26/2012 1000   NITRITE NEGATIVE 03/26/2017  [place] 03/26/2017 0118   Sepsis Labs Invalid input(s): PROCALCITONIN,  WBC,  LACTICIDVEN Microbiology No results found for this or any  previous visit (from the past 240 hour(s)).   Time coordinating discharge: 35 minutes  SIGNED:  Chipper Oman, MD  [facility] 03/30/2017, 11:34 AM  Pager please text page via  [email or web address] Password [email or web address]

## 2017-03-30 NOTE — Progress Notes (Signed)
Physical Therapy Treatment Patient Details Name: Ricardo Perkins MRN: [id number] DOB: 1923/03/27 Today's Date: 03/30/2017    History of Present Illness 83 yo male with onset of weakness adn FTT sustained a fall onto R knee mult times, has no current recall of falling on it. PMHx:  renal insufficiency, HTN, a-fib, OA, TKA, bradycardia, LE edema, COPD with wheezing, pulm nodule,     PT Comments    Pt doing much better today, was able to perform bed mobility and transfers with supervision, ambulated 40' with RW with min-guard A, and ascended 2 stairs with rail and min A. O2 sats dropped to 86% on 4L O2, O2 increased to 6L for stairs and then back down to 4L with sats at 87% after session. Daughter present and feels that mobility sufficient for him to get around his home. Rec changed to home with [facility]. PT will continue to follow.    Follow Up Recommendations  Home health PT;Supervision for mobility/OOB     Equipment Recommendations  Rolling walker with 5" wheels    Recommendations for Other Services       Precautions / Restrictions Precautions Precautions: Fall Precaution Comments: watch 02 Restrictions Weight Bearing Restrictions: No    Mobility  Bed Mobility Overal bed mobility: Needs Assistance Bed Mobility: Supine to Sit     Supine to sit: Supervision     General bed mobility comments: pt able to get to EOB with increased time but no physical assist  Transfers Overall transfer level: Needs assistance Equipment used: Rolling walker (2 wheeled) Transfers: Sit to/from Stand Sit to Stand: Min guard         General transfer comment: able to stand from bed and recliner without physical assist, vc's to remind to reach back for surface before sitting  Ambulation/Gait Ambulation/Gait assistance: Min guard;+2 safety/equipment Ambulation Distance (Feet): 40 Feet (30, 10) Assistive device: Rolling walker (2 wheeled) Gait Pattern/deviations: Step-through pattern;Decreased  stride length;Shuffle;Trunk flexed Gait velocity: decreased Gait velocity interpretation: Below normal speed for age/gender General Gait Details: able to tolerate increased walking today, chair brought behind. Daughter reports that distance walked would be equal to distance he would need to walk in the home at one time   Stairs Stairs: Yes   Stair Management: One rail Right;Step to pattern;Forwards Number of Stairs: 2 General stair comments: min A for safety. Pt O2 sats 86% before climbing stairs, O2 turned up to 6L and sats remained 86-87%  Wheelchair Mobility    Modified Rankin (Stroke Patients Only)       Balance Overall balance assessment: History of Falls;Needs assistance Sitting-balance support: Feet supported;Single extremity supported Sitting balance-Leahy Scale: Fair     Standing balance support: Bilateral upper extremity supported;During functional activity Standing balance-Leahy Scale: Poor Standing balance comment: requires UE support to maintain standing                            Cognition Arousal/Alertness: Awake/alert Behavior During Therapy: WFL for tasks assessed/performed Overall Cognitive Status: Within Functional Limits for tasks assessed                                        Exercises      General Comments        Pertinent Vitals/Pain Pain Assessment: Faces Faces Pain Scale: Hurts a little bit Pain Location: R knee  Pain Descriptors /  Indicators: Tender;Sore Pain Intervention(s): Limited activity within patient's tolerance;Monitored during session    Home Living                      Prior Function            PT Goals (current goals can now be found in the care plan section) Acute Rehab PT Goals Patient Stated Goal: to make his R knee pain better PT Goal Formulation: With patient Time For Goal Achievement: 04/10/17 Potential to Achieve Goals: Good Progress towards PT goals: Progressing toward  goals    Frequency    Min 3X/week      PT Plan Discharge plan needs to be updated    Co-evaluation              AM-PAC PT "6 Clicks" Daily Activity  Outcome Measure  Difficulty turning over in bed (including adjusting bedclothes, sheets and blankets)?: A Little Difficulty moving from lying on back to sitting on the side of the bed? : A Little Difficulty sitting down on and standing up from a chair with arms (e.g., wheelchair, bedside commode, etc,.)?: A Little Help needed moving to and from a bed to chair (including a wheelchair)?: A Little Help needed walking in hospital room?: A Little Help needed climbing 3-5 steps with a railing? : A Little 6 Click Score: 18    End of Session Equipment Utilized During Treatment: Gait belt;Oxygen Activity Tolerance: Patient tolerated treatment well Patient left: in chair;with call bell/phone within reach;with chair alarm set;with family/visitor present Nurse Communication: Mobility status PT Visit Diagnosis: Unsteadiness on feet (R26.81);History of falling (Z91.81);Difficulty in walking, not elsewhere classified (R26.2)     Time: 0211-1552 PT Time Calculation (min) (ACUTE ONLY): 24 min  Charges:  $Gait Training: 23-37 mins                    G Codes:       Leighton Roach, PT  Acute Rehab Services  [place] 03/30/2017, 9:42 AM

## 2017-03-30 NOTE — Telephone Encounter (Signed)
Returned call to daughter (ok per DPR), advised ok to keep appt with Dr. Stanford Breed 10/3.  Advised to call sooner with any questions or concerns.

## 2017-03-30 NOTE — Progress Notes (Addendum)
[place] for warfarin Indication: atrial fibrillation  No Known Allergies  Patient Measurements: Height: 5\' 10"  (177.8 cm) Weight: 186 lb (84.4 kg) IBW/kg (Calculated) : 73  Vital Signs: Temp: 98.4 F (36.9 C) (09/11 0550) Temp Source: Oral (09/11 0550) BP: 118/74 (09/11 0550) Pulse Rate: 68 (09/11 1042)  Labs:  Recent Labs  03/28/17 0453 03/28/17 0920 03/29/17 0255 03/29/17 1115 03/30/17 0706  HGB 12.1* 12.6*  --   --  12.9*  HCT 39.6 40.3  --   --  40.1  PLT 163 154  --   --  177  LABPROT 37.3*  --  26.2* 23.8* 24.4*  INR 3.82  --  2.43 2.14 2.22  CREATININE 1.42*  --  1.21  --  1.25*    Estimated Creatinine Clearance: 38.9 mL/min (A) (by C-G formula based on SCr of 1.25 mg/dL (H)).    Assessment: Patient on warfarin PTA for PAF. Patient is being admitted after multiple falls in the last 24 hours. Pharmacy consulted to monitor and dose while inpatient. INR therapeutic today at 2.22. No bleeding noted, CBC stable, patient not eating much, but may be back at baseline intake.  PTA dose: warfarin 1.5 mg daily except for 3 mg once weekly on Mondays  Goal of Therapy:  INR 2-3 Monitor platelets by anticoagulation protocol: Yes   Plan:  Give warfarin 1.5 mg po x 1, and plan to restart home dosing Monitor daily INR, CBC, clinical course, s/sx of bleed, PO intake, DDI   Thank you for allowing [place] to participate in this patients care.  [name], PharmD Clinical phone for 03/30/2017 from 7a-3:30p: [phone] If after 3:30p, please call main pharmacy at: [phone] 03/30/2017 11:13 AM

## 2017-03-30 NOTE — Discharge Instructions (Signed)

## 2017-03-30 NOTE — Progress Notes (Signed)
Nsg Discharge Note  Admit Date:  03/26/2017 Discharge date: 03/30/2017   Ricardo Perkins to be D/C'd Home per MD order.  AVS completed.  Copy for chart, and copy for patient signed, and dated. Patient/caregiver able to verbalize understanding.  Discharge Medication: Allergies as of 03/30/2017   No Known Allergies     Medication List    TAKE these medications   amLODipine 10 MG tablet Commonly known as:  NORVASC TAKE 1 TABLET BY MOUTH EVERY DAY   finasteride 5 MG tablet Commonly known as:  PROSCAR TAKE 1 TABLET(5 MG) BY MOUTH DAILY   furosemide 20 MG tablet Commonly known as:  LASIX Take 1 tablet (20 mg total) by mouth daily. What changed:  medication strength  how much to take  when to take this  reasons to take this   lidocaine 5 % Commonly known as:  LIDODERM Place 1 patch onto the skin daily. Remove & Discard patch within 12 hours or as directed by MD What changed:  when to take this  reasons to take this  additional instructions   metoprolol tartrate 25 MG tablet Commonly known as:  LOPRESSOR Take 0.5 tablets (12.5 mg total) by mouth 2 (two) times daily. What changed:  See the new instructions.   omeprazole 20 MG capsule Commonly known as:  PRILOSEC TAKE ONE CAPSULE BY MOUTH EVERY DAY AS NEEDED   potassium chloride SA 20 MEQ tablet Commonly known as:  K-DUR,KLOR-CON Take 1 tablet (20 mEq total) by mouth daily.   predniSONE 10 MG tablet Commonly known as:  DELTASONE Take 1 tablet (10 mg total) by mouth daily with breakfast. Annual appt is due  must see provider for future refills   tamsulosin 0.4 MG Caps capsule Commonly known as:  FLOMAX Take 1 capsule (0.4 mg total) by mouth daily after supper. --- Pt needs appt for further refills   temazepam 30 MG capsule Commonly known as:  RESTORIL TAKE 1 CAPSULE BY MOUTH EVERY DAY AT BEDTIME   traMADol 50 MG tablet Commonly known as:  ULTRAM TAKE 1 TO 2 TABLETS BY MOUTH EVERY 6 HOURS AS NEEDED    warfarin 3 MG tablet Commonly known as:  COUMADIN AS DIRECTED What changed:  how much to take  how to take this  when to take this  additional instructions            Discharge Care Instructions        Start     Ordered   03/30/17 0000  furosemide (LASIX) 20 MG tablet  Daily     03/30/17 1127   03/30/17 0000  metoprolol tartrate (LOPRESSOR) 25 MG tablet  2 times daily     03/30/17 1127   03/30/17 0000  potassium chloride SA (K-DUR,KLOR-CON) 20 MEQ tablet  Daily     03/30/17 1127   03/30/17 0000  Increase activity slowly     03/30/17 1127   03/30/17 0000  Diet - low sodium heart healthy     03/30/17 1127   03/30/17 0000  Call MD for:  temperature >100.4     03/30/17 1127   03/30/17 0000  Call MD for:  persistant nausea and vomiting     03/30/17 1127   03/30/17 0000  Call MD for:  severe uncontrolled pain     03/30/17 1127   03/30/17 0000  Call MD for:  redness, tenderness, or signs of infection (pain, swelling, redness, odor or green/yellow discharge around incision site)     03/30/17  1127   03/30/17 0000  Call MD for:  difficulty breathing, headache or visual disturbances     03/30/17 1127   03/30/17 0000  Call MD for:  hives     03/30/17 1127   03/30/17 0000  Call MD for:  persistant dizziness or light-headedness     03/30/17 1127   03/30/17 0000  Call MD for:  extreme fatigue     03/30/17 1127      Discharge Assessment: Vitals:   03/30/17 1042 03/30/17 1407  BP:  (!) 118/55  Pulse: 68 85  Resp:  18  Temp:  98.3 F (36.8 C)  SpO2:  90%   Skin clean, dry and intact without evidence of skin break down, no evidence of skin tears noted. IV catheter discontinued intact. Site without signs and symptoms of complications - no redness or edema noted at insertion site, patient denies c/o pain - only slight tenderness at site.  Dressing with slight pressure applied.  D/c Instructions-Education: Discharge instructions given to patient/family with verbalized  understanding. D/c education completed with patient/family including follow up instructions, medication list, d/c activities limitations if indicated, with other d/c instructions as indicated by MD - patient able to verbalize understanding, all questions fully answered. Patient instructed to return to ED, call 911, or call MD for any changes in condition.  Patient escorted via [place], and D/C home via private auto.  Salley Slaughter, RN 03/30/2017 4:08 PM

## 2017-03-30 NOTE — Care Management Note (Signed)
Case Management Note  Patient Details  Name: Ricardo Perkins MRN: [id number] Date of Birth: Apr 15, 1923  Subjective/Objective:                  Presented with generalized weakness,hx of benign prostatic hypertrophy, bradycardia, carotid artery stenosis, hypertension, atrial fibrillation, andchronic kidney disease. Resides with daughter.   [name] (Daughter)     [phone]           PCP: Nadara Mustard  Action/Plan: Plan is to d/c to home today with home health services. Pt 's daughter will provide transportation to home.  Expected Discharge Date:  03/30/17               Expected Discharge Plan:  [place]  In-House Referral:   Home 1st Program  Discharge planning Services  CM Consult  Post Acute Care Choice:    Choice offered to:  Adult Children  DME Arranged:    DME Agency:     HH Arranged:  PT, OT [place] Agency:  [facility], referral made with [facility] @ [phone] for Home 1st Program/ home health   Status of Service:  Completed, signed off  If discussed at [place] of Stay Meetings, dates discussed:    Additional Comments:  Sharin Mons, RN 03/30/2017, 4:32 PM

## 2017-03-30 NOTE — Telephone Encounter (Signed)
New Message  Pt daughter call requesting to speak with RN. She states he is being discharged from the hospital for [facility] needs a f/u appt for 7-14 days.she would like to know if it would be okay for pt to wait until ov with Dr. Stanford Breed on 10/3. Please call back to discuss

## 2017-03-31 DIAGNOSIS — I5030 Unspecified diastolic (congestive) heart failure: Secondary | ICD-10-CM | POA: Diagnosis not present

## 2017-03-31 DIAGNOSIS — I480 Paroxysmal atrial fibrillation: Secondary | ICD-10-CM | POA: Diagnosis not present

## 2017-03-31 DIAGNOSIS — I130 Hypertensive heart and chronic kidney disease with heart failure and stage 1 through stage 4 chronic kidney disease, or unspecified chronic kidney disease: Secondary | ICD-10-CM | POA: Diagnosis not present

## 2017-04-01 ENCOUNTER — Ambulatory Visit (INDEPENDENT_AMBULATORY_CARE_PROVIDER_SITE_OTHER): Payer: Medicare Other | Admitting: General Practice

## 2017-04-01 DIAGNOSIS — Z7901 Long term (current) use of anticoagulants: Secondary | ICD-10-CM | POA: Insufficient documentation

## 2017-04-01 DIAGNOSIS — I4891 Unspecified atrial fibrillation: Secondary | ICD-10-CM | POA: Diagnosis not present

## 2017-04-01 DIAGNOSIS — Z5181 Encounter for therapeutic drug level monitoring: Secondary | ICD-10-CM | POA: Diagnosis not present

## 2017-04-01 LAB — POCT INR: INR: 2.3

## 2017-04-01 NOTE — Progress Notes (Signed)
I have reviewed and agree with the plan. 

## 2017-04-02 DIAGNOSIS — I130 Hypertensive heart and chronic kidney disease with heart failure and stage 1 through stage 4 chronic kidney disease, or unspecified chronic kidney disease: Secondary | ICD-10-CM | POA: Diagnosis not present

## 2017-04-02 DIAGNOSIS — I5030 Unspecified diastolic (congestive) heart failure: Secondary | ICD-10-CM | POA: Diagnosis not present

## 2017-04-05 ENCOUNTER — Other Ambulatory Visit: Payer: Self-pay

## 2017-04-05 DIAGNOSIS — I5030 Unspecified diastolic (congestive) heart failure: Secondary | ICD-10-CM | POA: Diagnosis not present

## 2017-04-05 DIAGNOSIS — I130 Hypertensive heart and chronic kidney disease with heart failure and stage 1 through stage 4 chronic kidney disease, or unspecified chronic kidney disease: Secondary | ICD-10-CM | POA: Diagnosis not present

## 2017-04-05 NOTE — Patient Outreach (Signed)
[facility]) Care Management  04/05/2017  Ricardo Perkins April 05, 1923 [id number]     EMMI- General Discharge RED ON EMMI ALERT Day # 1 Date: 04/02/17 Red Alert Reason: "Scheduled follow up? No"   Outreach attempt # 1 to patient. Spoke with patient who voices he is doing well. He denies any new and/or worsening problems/issues. RN CM reviewed and addressed red alert with patient. He confirmed that he has made his f/u appts. He goes to see Dr. Crenshaw(cardiologist) on 04/21/17. Patient states that his PCP is out of the office on maternity leave. He did make an appt with covering MD. He voices no issues with transportation. Patient states he has all his meds and understands when and how to take meds. No further RN CM needs or concerns at this time. Advised patient that they would continue to get automated EMMI-GENERAL post discharge calls to assess how they are doing following recent hospitalization and will receive a call from a nurse if any of their responses were abnormal. Patient voiced understanding and was appreciative of f/u call.        Plan: RN CM will notify [facility] administrative assistant of case status.   Enzo Montgomery, RN,BSN,CCM [facility] Telephonic Care Management Coordinator Direct Phone: [phone] Toll Free: [phone] Fax: [phone]

## 2017-04-06 ENCOUNTER — Telehealth: Payer: Self-pay | Admitting: Internal Medicine

## 2017-04-06 DIAGNOSIS — I130 Hypertensive heart and chronic kidney disease with heart failure and stage 1 through stage 4 chronic kidney disease, or unspecified chronic kidney disease: Secondary | ICD-10-CM | POA: Diagnosis not present

## 2017-04-06 DIAGNOSIS — I5030 Unspecified diastolic (congestive) heart failure: Secondary | ICD-10-CM | POA: Diagnosis not present

## 2017-04-06 NOTE — Telephone Encounter (Signed)
Notified Don w/MD response...Johny Chess

## 2017-04-06 NOTE — Telephone Encounter (Signed)
Fine

## 2017-04-06 NOTE — Telephone Encounter (Signed)
Needs verbals for OT for 1week3 for IABL ABL,  transfers and exercise, heart failure education and ok to DC when goals met for max potential.

## 2017-04-07 DIAGNOSIS — I5030 Unspecified diastolic (congestive) heart failure: Secondary | ICD-10-CM | POA: Diagnosis not present

## 2017-04-07 DIAGNOSIS — I130 Hypertensive heart and chronic kidney disease with heart failure and stage 1 through stage 4 chronic kidney disease, or unspecified chronic kidney disease: Secondary | ICD-10-CM | POA: Diagnosis not present

## 2017-04-12 NOTE — Progress Notes (Signed)
.      HPI: FU atrial fibrillation. Myoview 3/13 was low risk with an EF of 62% with small reversible defect in the apex suggestive of very mild apical ischemia. Chest CT 1/17 showed TAA 4.1 cm. Abd CT 3/17 showed pulmonary fibrosis, AAA 3.6 x 3.7 cm. We have elected not to pursue follow-up studies for his thoracic and aortic aneurysms because he would not be a candidate for repair. Had GI bleed 3/17 felt likely diverticular; coumadin transiently held. Patient admitted September 2018 with generalized weakness and recurrent falls. Echocardiogram repeated September 2018 following an episode of near syncope/fall. Normal LV function, grade 2 diastolic dysfunction, mild aortic insufficiency, moderate tricuspid regurgitation and moderate to severe pulmonary hypertension. Carotid Doppler September 2018 showed 1-39% left stenosis. Mild soft plaque throughout the right common carotid artery. Since last seen, patient does have dyspnea on exertion unchanged. He denies orthopnea, PND, palpitations, syncope or exertional chest pain. He is not falling frequently report. He does have mild pedal edema  Current Outpatient Prescriptions  Medication Sig Dispense Refill  . amLODipine (NORVASC) 10 MG tablet TAKE 1 TABLET BY MOUTH EVERY DAY 90 tablet 0  . finasteride (PROSCAR) 5 MG tablet TAKE 1 TABLET(5 MG) BY MOUTH DAILY 90 tablet 0  . furosemide (LASIX) 20 MG tablet Take 1 tablet (20 mg total) by mouth daily. 30 tablet 0  . lidocaine (LIDODERM) 5 % Place 1 patch onto the skin daily. Remove & Discard patch within 12 hours or as directed by MD (Patient taking differently: Place 1 patch onto the skin daily as needed (pain). Remove & Discard patch within 12 hours or as directed by MD) 60 patch 6  . metoprolol tartrate (LOPRESSOR) 25 MG tablet Take 0.5 tablets (12.5 mg total) by mouth 2 (two) times daily. 90 tablet 0  . potassium chloride SA (K-DUR,KLOR-CON) 20 MEQ tablet Take 1 tablet (20 mEq total) by mouth daily. 15 tablet  0  . predniSONE (DELTASONE) 10 MG tablet Take 1 tablet (10 mg total) by mouth daily with breakfast. Annual appt is due  must see provider for future refills 30 tablet 0  . tamsulosin (FLOMAX) 0.4 MG CAPS capsule Take 1 capsule (0.4 mg total) by mouth daily after supper. --- Pt needs appt for further refills 90 capsule 0  . temazepam (RESTORIL) 30 MG capsule TAKE 1 CAPSULE BY MOUTH EVERY DAY AT BEDTIME 30 capsule 0  . traMADol (ULTRAM) 50 MG tablet TAKE 1 TO 2 TABLETS BY MOUTH EVERY 6 HOURS AS NEEDED 60 tablet 0  . warfarin (COUMADIN) 3 MG tablet AS DIRECTED (Patient taking differently: Take 1.5-3 mg by mouth See admin instructions. 1.5mg  daily except 3mg  Monday) 90 tablet 1   No current facility-administered medications for this visit.      Past Medical History:  Diagnosis Date  . Advanced age   . Arthritis    knees, shoulders   . ASTEATOTIC ECZEMA   . BENIGN PROSTATIC HYPERTROPHY, WITH OBSTRUCTION   . BRADYCARDIA    1st degree AVB  . COPD (chronic obstructive pulmonary disease) (HCC)    on chronic oxygen therapy qhs>daytime  . DECREASED HEARING   . Hx of cardiovascular stress test    a.  MV 4/09:  possible small area of lateral ischemia at the apex, but Dr. Kirk Ruths reviewed this, and in fact, felt it was most likely normal.  b.  Myoview 3/13 was again low risk with an EF of 62% with small reversible defect in the apex suggestive  of very mild apical ischemia.  Marland Kitchen Hx of echocardiogram    a. echo in 2009 (in [place]): normal EF, mild LVH;   b. echo 10/13:  mid Ant HK, mod LVH, EF 55%, trivial AI, mod LAE, mild RVE, mild reduced RVSF, PASP 46  . HYPERTENSION   . INSOMNIA, PERSISTENT   . Large bilateral inguinal hernias - chronic, containing small bowel (right) and sigmoid colon (left) 01/15/2011   s/p B repair  . LEG EDEMA, BILATERAL   . OA (osteoarthritis) of knee    S/p right TKR March '13 (Dr. Wynelle Link)   . PAROXYSMAL ATRIAL FIBRILLATION    coumadin rx  . RENAL INSUFFICIENCY     . RHINITIS, VASOMOTOR 01/07/2010    Past Surgical History:  Procedure Laterality Date  . APPENDECTOMY     @ 23  . EYE SURGERY     bilateral cataract surgery   . HERNIA REPAIR  01/2011   bilateral inguinal hernia   . KNEE SURGERY  right  . OTHER SURGICAL HISTORY     left finger surgery   . TONSILLECTOMY    . TOTAL KNEE ARTHROPLASTY  10/26/2011   Procedure: TOTAL KNEE ARTHROPLASTY;  Surgeon: Gearlean Alf, MD;  Location: [facility];  Service: Orthopedics;  Laterality: Right;    Social History   Social History  . Marital status: Single    Spouse name: N/A  . Number of children: N/A  . Years of education: N/A   Occupational History  . Not on file.   Social History Main Topics  . Smoking status: Former Smoker    Packs/day: 1.00    Years: 30.00    Types: Cigarettes    Quit date: 07/20/1998  . Smokeless tobacco: Never Used  . Alcohol use 0.0 oz/week     Comment: wine occasional   . Drug use: No  . Sexual activity: Not Currently   Other Topics Concern  . 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 Heart disease Father   . Heart attack Father   . Cancer Mother        colon   . Cirrhosis Sister        liver failure  . Heart attack Brother   . Diabetes Neg Hx     ROS: no fevers or chills, productive cough, hemoptysis, dysphasia, odynophagia, melena, hematochezia, dysuria, hematuria, rash, seizure activity, orthopnea, PND, pedal edema, claudication. Remaining systems are negative.  Physical Exam: Well-developed chronically ill appearing in no acute distress.  Skin is warm and dry.  HEENT is normal.  Neck is supple.  Chest with mildly diminished BS throughout Cardiovascular exam is regular rate and rhythm.  Abdominal exam nontender or distended.  No masses palpated. Extremities show trace edema. neuro grossly intact   A/P  1 Paroxysmal atrial fibrillation-patient is in sinus rhythm on exam today. Continue beta blocker for rate control if atrial fibrillation recurs.  2 hypertension-pressure is controlled. Continue present medications.  3 abdominal aortic aneurysm/Thoracic aortic aneurysm-as outlined in previous notes I do not think patient would be a candidate for surgical intervention if his abdominal aortic aneurysm increased in size. We were therefore not pursue follow-up imaging. Patient is in agreement with  this.  4 cardiomyopathy-LV function improved on most recent echo. Continue beta blocker. We are managing conservatively given patient's age.  5 Hypokalemia-potassium noted to be 2.8 on laboratories September 11. Repeat potassium and renal function.  6 chronic diastolic congestive heart failure-patient is not volume overloaded on examination. He is only intermittently taking his Lasix. I have asked him to take this daily. Follow and adjust regimen as needed.  Kirk Ruths, MD

## 2017-04-14 LAB — POCT INR: INR: 2.2

## 2017-04-15 ENCOUNTER — Ambulatory Visit (INDEPENDENT_AMBULATORY_CARE_PROVIDER_SITE_OTHER): Payer: Medicare Other | Admitting: General Practice

## 2017-04-15 DIAGNOSIS — Z7901 Long term (current) use of anticoagulants: Secondary | ICD-10-CM | POA: Diagnosis not present

## 2017-04-15 DIAGNOSIS — I4891 Unspecified atrial fibrillation: Secondary | ICD-10-CM

## 2017-04-15 NOTE — Progress Notes (Signed)
I have reviewed and agree with the plan. 

## 2017-04-15 NOTE — Patient Instructions (Signed)
Pre visit review using our clinic review tool, if applicable. No additional management support is needed unless otherwise documented below in the visit note. 

## 2017-04-21 ENCOUNTER — Encounter: Payer: Self-pay | Admitting: Cardiology

## 2017-04-21 ENCOUNTER — Ambulatory Visit (INDEPENDENT_AMBULATORY_CARE_PROVIDER_SITE_OTHER): Payer: Medicare Other | Admitting: Cardiology

## 2017-04-21 VITALS — BP 127/65 | HR 60 | Ht 70.0 in | Wt 188.4 lb

## 2017-04-21 DIAGNOSIS — I714 Abdominal aortic aneurysm, without rupture, unspecified: Secondary | ICD-10-CM

## 2017-04-21 DIAGNOSIS — I5032 Chronic diastolic (congestive) heart failure: Secondary | ICD-10-CM

## 2017-04-21 DIAGNOSIS — I10 Essential (primary) hypertension: Secondary | ICD-10-CM | POA: Diagnosis not present

## 2017-04-21 DIAGNOSIS — I480 Paroxysmal atrial fibrillation: Secondary | ICD-10-CM | POA: Diagnosis not present

## 2017-04-21 NOTE — Patient Instructions (Signed)

## 2017-04-22 LAB — BASIC METABOLIC PANEL
BUN/Creatinine Ratio: 17 (ref 10–24)
BUN: 22 mg/dL (ref 10–36)
CO2: 23 mmol/L (ref 20–29)
CREATININE: 1.3 mg/dL — AB (ref 0.76–1.27)
Calcium: 9 mg/dL (ref 8.6–10.2)
Chloride: 109 mmol/L — ABNORMAL HIGH (ref 96–106)
GFR calc Af Amer: 55 mL/min/{1.73_m2} — ABNORMAL LOW (ref >59)
GFR calc non Af Amer: 47 mL/min/{1.73_m2} — ABNORMAL LOW (ref >59)
GLUCOSE: 133 mg/dL — AB (ref 65–99)
Potassium: 4 mmol/L (ref 3.5–5.2)
Sodium: 148 mmol/L — ABNORMAL HIGH (ref 134–144)

## 2017-04-26 ENCOUNTER — Other Ambulatory Visit: Payer: Self-pay | Admitting: Internal Medicine

## 2017-04-28 ENCOUNTER — Other Ambulatory Visit: Payer: Self-pay | Admitting: Internal Medicine

## 2017-04-28 DIAGNOSIS — I480 Paroxysmal atrial fibrillation: Secondary | ICD-10-CM | POA: Diagnosis not present

## 2017-04-28 LAB — POCT INR: INR: 1.8

## 2017-04-28 NOTE — Telephone Encounter (Signed)
Faxed to [facility] [address] and [place]

## 2017-04-29 ENCOUNTER — Ambulatory Visit (INDEPENDENT_AMBULATORY_CARE_PROVIDER_SITE_OTHER): Payer: Medicare Other | Admitting: General Practice

## 2017-04-29 DIAGNOSIS — I4891 Unspecified atrial fibrillation: Secondary | ICD-10-CM | POA: Diagnosis not present

## 2017-04-29 DIAGNOSIS — Z7901 Long term (current) use of anticoagulants: Secondary | ICD-10-CM | POA: Diagnosis not present

## 2017-04-29 NOTE — Progress Notes (Signed)
Medical treatment/procedure(s) were performed by non-physician practitioner and as supervising physician I was immediately available for consultation/collaboration. I agree with above. Yosselin Zoeller A Maddex Garlitz, MD  

## 2017-04-29 NOTE — Patient Instructions (Signed)
Pre visit review using our clinic review tool, if applicable. No additional management support is needed unless otherwise documented below in the visit note. 

## 2017-05-03 ENCOUNTER — Other Ambulatory Visit: Payer: Self-pay | Admitting: Internal Medicine

## 2017-05-04 NOTE — Telephone Encounter (Signed)
Pt daughter called asking why her medication was refused, was told he needed an appointment, she does not believe the patient needs to come in for an appointment because they just saw Dr. Stanford Breed his cardiologists after he was in the hospital for heart failure, daughter became upset asking if we understand how hard it is to bring him in the office Please call back in regard to the refill

## 2017-05-05 NOTE — Telephone Encounter (Signed)
As we talked about at their last visit he needs to see the lung specialist at least yearly as they should be prescribing this medication. We they scheduled that visit since last December. Can give 1 month only but needs to see pulmonary. I understand that it is difficult for him to leave the house but we need to see him occasionally to make sure his health is helped by medications and not harmed. This is a serious medication.

## 2017-05-07 ENCOUNTER — Encounter: Payer: Self-pay | Admitting: Internal Medicine

## 2017-05-07 ENCOUNTER — Ambulatory Visit (INDEPENDENT_AMBULATORY_CARE_PROVIDER_SITE_OTHER): Payer: Medicare Other | Admitting: Internal Medicine

## 2017-05-07 VITALS — BP 110/70 | HR 53 | Temp 97.6°F | Ht 70.0 in | Wt 190.0 lb

## 2017-05-07 DIAGNOSIS — Z23 Encounter for immunization: Secondary | ICD-10-CM | POA: Diagnosis not present

## 2017-05-07 DIAGNOSIS — J8410 Pulmonary fibrosis, unspecified: Secondary | ICD-10-CM

## 2017-05-07 MED ORDER — METOPROLOL TARTRATE 25 MG PO TABS: 12.5000 mg | ORAL_TABLET | Freq: Two times a day (BID) | ORAL | 3 refills | Status: AC

## 2017-05-07 MED ORDER — FINASTERIDE 5 MG PO TABS: ORAL_TABLET | ORAL | 3 refills | Status: AC

## 2017-05-07 MED ORDER — POTASSIUM CHLORIDE CRYS ER 20 MEQ PO TBCR: 20.0000 meq | EXTENDED_RELEASE_TABLET | Freq: Every day | ORAL | 3 refills | Status: AC

## 2017-05-07 MED ORDER — PREDNISONE 10 MG PO TABS: 10.0000 mg | ORAL_TABLET | Freq: Every day | ORAL | 1 refills | Status: DC

## 2017-05-07 MED ORDER — WARFARIN SODIUM 3 MG PO TABS: ORAL_TABLET | ORAL | 1 refills | Status: DC

## 2017-05-07 MED ORDER — FUROSEMIDE 20 MG PO TABS: 20.0000 mg | ORAL_TABLET | Freq: Every day | ORAL | 3 refills | Status: DC

## 2017-05-07 MED ORDER — TAMSULOSIN HCL 0.4 MG PO CAPS: 0.4000 mg | ORAL_CAPSULE | Freq: Every day | ORAL | 3 refills | Status: AC

## 2017-05-07 MED ORDER — TRAMADOL HCL 50 MG PO TABS: 50.0000 mg | ORAL_TABLET | Freq: Two times a day (BID) | ORAL | 0 refills | Status: DC | PRN

## 2017-05-07 MED ORDER — AMLODIPINE BESYLATE 10 MG PO TABS: 10.0000 mg | ORAL_TABLET | Freq: Every day | ORAL | 3 refills | Status: DC

## 2017-05-07 NOTE — Progress Notes (Signed)
SATURATION QUALIFICATIONS: (This note is used to comply with regulatory documentation for home oxygen)  Patient Saturations on Room Air at Rest = 88%  Patient Saturations on Room Air while Ambulating = 86%  Patient Saturations on 4L Liters of oxygen while Ambulating = 88%  Please briefly explain why patient needs home oxygen:

## 2017-05-07 NOTE — Assessment & Plan Note (Signed)
Refill prednisone and needs visit every 6 months for evaluation. Oxygen qualification done at the visit today and papers filled out for supplies. He did become hypoxic with exertion which resolved on 4L oxygen.

## 2017-05-07 NOTE — Patient Instructions (Signed)
We will send in the refills for you today.

## 2017-05-07 NOTE — Progress Notes (Signed)
   Subjective:    Patient ID: Ricardo Perkins, male    DOB: [birth year], 83 y.o.   MRN: [id number]  HPI The patient is a 83 YO man coming in for follow up of his post-inflammatory pulmonary fibrosis. He is taking prednisone daily and is feeling fine. Uses 4 L all the time which helps him feel okay. He is not able to do much activity. Gets winded walking to the kitchen. Denies cough or sputum. Is satisfied with his current level of activity. No worsening recently. Weight is stable. Taking lasix daily and following with cardiology.   Review of Systems  Constitutional: Negative.   HENT: Negative.   Eyes: Negative.   Respiratory: Positive for shortness of breath. Negative for cough and chest tightness.   Cardiovascular: Negative for chest pain, palpitations and leg swelling.  Gastrointestinal: Negative for abdominal distention, abdominal pain, constipation, diarrhea, nausea and vomiting.  Musculoskeletal: Negative.   Skin: Negative.   Neurological: Negative.   Psychiatric/Behavioral: Negative.       Objective:   Physical Exam  Constitutional: He is oriented to person, place, and time. He appears well-developed and well-nourished.  HENT:  Head: Normocephalic and atraumatic.  Eyes: EOM are normal.  Neck: Normal range of motion.  Cardiovascular: Normal rate and regular rhythm.   Pulmonary/Chest: Effort normal. No respiratory distress. He has no wheezes. He has no rales.  4L O2 continuous  Abdominal: Soft. Bowel sounds are normal. He exhibits no distension. There is no tenderness. There is no rebound.  Musculoskeletal: He exhibits no edema.  Neurological: He is alert and oriented to person, place, and time. Coordination normal.  Skin: Skin is warm and dry.   Vitals:   05/07/17 1003  BP: 110/70  Pulse: (!) 53  Temp: 97.6 F (36.4 C)  TempSrc: Oral  SpO2: 92%  Weight: 190 lb (86.2 kg)  Height: 5\' 10"  (1.778 m)      Assessment & Plan:  Flu shot given at visit

## 2017-05-13 ENCOUNTER — Ambulatory Visit (INDEPENDENT_AMBULATORY_CARE_PROVIDER_SITE_OTHER): Payer: Medicare Other | Admitting: General Practice

## 2017-05-13 DIAGNOSIS — Z7901 Long term (current) use of anticoagulants: Secondary | ICD-10-CM | POA: Diagnosis not present

## 2017-05-13 LAB — POCT INR: INR: 2.4

## 2017-05-13 NOTE — Progress Notes (Signed)
Medical treatment/procedure(s) were performed by non-physician practitioner and as supervising physician I was immediately available for consultation/collaboration. I agree with above. Elizabeth A Crawford, MD  

## 2017-05-13 NOTE — Patient Instructions (Signed)
Pre visit review using our clinic review tool, if applicable. No additional management support is needed unless otherwise documented below in the visit note. 

## 2017-05-27 ENCOUNTER — Ambulatory Visit (INDEPENDENT_AMBULATORY_CARE_PROVIDER_SITE_OTHER): Payer: Medicare Other | Admitting: General Practice

## 2017-05-27 DIAGNOSIS — Z7901 Long term (current) use of anticoagulants: Secondary | ICD-10-CM

## 2017-05-27 DIAGNOSIS — I4891 Unspecified atrial fibrillation: Secondary | ICD-10-CM | POA: Diagnosis not present

## 2017-05-27 LAB — POCT INR: INR: 2.6

## 2017-05-27 NOTE — Patient Instructions (Signed)
Pre visit review using our clinic review tool, if applicable. No additional management support is needed unless otherwise documented below in the visit note. 

## 2017-06-09 LAB — POCT INR: INR: 2.2

## 2017-06-15 ENCOUNTER — Ambulatory Visit (INDEPENDENT_AMBULATORY_CARE_PROVIDER_SITE_OTHER): Payer: Medicare Other | Admitting: General Practice

## 2017-06-15 DIAGNOSIS — Z7901 Long term (current) use of anticoagulants: Secondary | ICD-10-CM

## 2017-06-15 DIAGNOSIS — I4891 Unspecified atrial fibrillation: Secondary | ICD-10-CM

## 2017-06-15 NOTE — Progress Notes (Signed)
Medical treatment/procedure(s) were performed by non-physician practitioner and as supervising physician I was immediately available for consultation/collaboration. I agree with above. Jamier Urbas A Jazell Rosenau, MD  

## 2017-06-15 NOTE — Patient Instructions (Addendum)
Pre visit review using our clinic review tool, if applicable. No additional management support is needed unless otherwise documented below in the visit note.  Continue to take 1/2 tablet on all days except 1 tablet on Mon.   Re-check in 2 weeks. Dosing instructions given to patient.   Monitored by [facility] home monitoring service.

## 2017-06-23 DIAGNOSIS — I480 Paroxysmal atrial fibrillation: Secondary | ICD-10-CM | POA: Diagnosis not present

## 2017-06-24 ENCOUNTER — Ambulatory Visit (INDEPENDENT_AMBULATORY_CARE_PROVIDER_SITE_OTHER): Payer: Medicare Other | Admitting: General Practice

## 2017-06-24 DIAGNOSIS — Z7901 Long term (current) use of anticoagulants: Secondary | ICD-10-CM

## 2017-06-24 DIAGNOSIS — I4891 Unspecified atrial fibrillation: Secondary | ICD-10-CM | POA: Diagnosis not present

## 2017-06-24 LAB — POCT INR: INR: 2.4

## 2017-06-24 NOTE — Progress Notes (Signed)
Medical treatment/procedure(s) were performed by non-physician practitioner and as supervising physician I was immediately available for consultation/collaboration. I agree with above. Daniele Yankowski A Micholas Drumwright, MD  

## 2017-06-24 NOTE — Patient Instructions (Signed)
Pre visit review using our clinic review tool, if applicable. No additional management support is needed unless otherwise documented below in the visit note.  Continue to take 1/2 tablet on all days except 1 tablet on Mon.   Re-check in 2 weeks. Dosing instructions given to patient.   Monitored by [facility] home monitoring service.

## 2017-07-04 ENCOUNTER — Inpatient Hospital Stay (HOSPITAL_COMMUNITY)
Admission: EM | Admit: 2017-07-04 | Discharge: 2017-07-09 | DRG: 193 | Disposition: A | Discharge status: Home Health | Payer: Medicare Other | Attending: Family Medicine | Admitting: Family Medicine

## 2017-07-04 ENCOUNTER — Inpatient Hospital Stay (HOSPITAL_COMMUNITY): Payer: Medicare Other

## 2017-07-04 ENCOUNTER — Other Ambulatory Visit: Payer: Self-pay

## 2017-07-04 ENCOUNTER — Encounter (HOSPITAL_COMMUNITY): Payer: Self-pay | Admitting: Emergency Medicine

## 2017-07-04 ENCOUNTER — Emergency Department (HOSPITAL_COMMUNITY): Payer: Medicare Other

## 2017-07-04 DIAGNOSIS — I4891 Unspecified atrial fibrillation: Secondary | ICD-10-CM | POA: Diagnosis not present

## 2017-07-04 DIAGNOSIS — R000 Tachycardia, unspecified: Secondary | ICD-10-CM | POA: Diagnosis not present

## 2017-07-04 DIAGNOSIS — J440 Chronic obstructive pulmonary disease with acute lower respiratory infection: Secondary | ICD-10-CM | POA: Diagnosis present

## 2017-07-04 DIAGNOSIS — Z7901 Long term (current) use of anticoagulants: Secondary | ICD-10-CM

## 2017-07-04 DIAGNOSIS — H9190 Unspecified hearing loss, unspecified ear: Secondary | ICD-10-CM | POA: Diagnosis present

## 2017-07-04 DIAGNOSIS — Z9981 Dependence on supplemental oxygen: Secondary | ICD-10-CM

## 2017-07-04 DIAGNOSIS — R791 Abnormal coagulation profile: Secondary | ICD-10-CM | POA: Diagnosis present

## 2017-07-04 DIAGNOSIS — I351 Nonrheumatic aortic (valve) insufficiency: Secondary | ICD-10-CM | POA: Diagnosis not present

## 2017-07-04 DIAGNOSIS — J181 Lobar pneumonia, unspecified organism: Secondary | ICD-10-CM | POA: Diagnosis not present

## 2017-07-04 DIAGNOSIS — K529 Noninfective gastroenteritis and colitis, unspecified: Secondary | ICD-10-CM | POA: Diagnosis present

## 2017-07-04 DIAGNOSIS — J9621 Acute and chronic respiratory failure with hypoxia: Secondary | ICD-10-CM | POA: Diagnosis present

## 2017-07-04 DIAGNOSIS — Z8249 Family history of ischemic heart disease and other diseases of the circulatory system: Secondary | ICD-10-CM

## 2017-07-04 DIAGNOSIS — N183 Chronic kidney disease, stage 3 unspecified: Secondary | ICD-10-CM | POA: Diagnosis present

## 2017-07-04 DIAGNOSIS — R0902 Hypoxemia: Secondary | ICD-10-CM | POA: Diagnosis not present

## 2017-07-04 DIAGNOSIS — R05 Cough: Secondary | ICD-10-CM | POA: Diagnosis not present

## 2017-07-04 DIAGNOSIS — I480 Paroxysmal atrial fibrillation: Secondary | ICD-10-CM | POA: Diagnosis not present

## 2017-07-04 DIAGNOSIS — R197 Diarrhea, unspecified: Secondary | ICD-10-CM | POA: Diagnosis present

## 2017-07-04 DIAGNOSIS — Z87891 Personal history of nicotine dependence: Secondary | ICD-10-CM | POA: Diagnosis not present

## 2017-07-04 DIAGNOSIS — I130 Hypertensive heart and chronic kidney disease with heart failure and stage 1 through stage 4 chronic kidney disease, or unspecified chronic kidney disease: Secondary | ICD-10-CM | POA: Diagnosis present

## 2017-07-04 DIAGNOSIS — R748 Abnormal levels of other serum enzymes: Secondary | ICD-10-CM | POA: Diagnosis present

## 2017-07-04 DIAGNOSIS — Z66 Do not resuscitate: Secondary | ICD-10-CM | POA: Diagnosis present

## 2017-07-04 DIAGNOSIS — T380X5A Adverse effect of glucocorticoids and synthetic analogues, initial encounter: Secondary | ICD-10-CM | POA: Diagnosis present

## 2017-07-04 DIAGNOSIS — I5032 Chronic diastolic (congestive) heart failure: Secondary | ICD-10-CM | POA: Diagnosis present

## 2017-07-04 DIAGNOSIS — R7303 Prediabetes: Secondary | ICD-10-CM | POA: Diagnosis present

## 2017-07-04 DIAGNOSIS — R7989 Other specified abnormal findings of blood chemistry: Secondary | ICD-10-CM | POA: Diagnosis present

## 2017-07-04 DIAGNOSIS — R778 Other specified abnormalities of plasma proteins: Secondary | ICD-10-CM | POA: Diagnosis present

## 2017-07-04 DIAGNOSIS — Z96651 Presence of right artificial knee joint: Secondary | ICD-10-CM | POA: Diagnosis present

## 2017-07-04 DIAGNOSIS — R739 Hyperglycemia, unspecified: Secondary | ICD-10-CM | POA: Diagnosis present

## 2017-07-04 DIAGNOSIS — R0781 Pleurodynia: Secondary | ICD-10-CM | POA: Diagnosis not present

## 2017-07-04 DIAGNOSIS — R54 Age-related physical debility: Secondary | ICD-10-CM | POA: Diagnosis present

## 2017-07-04 DIAGNOSIS — J441 Chronic obstructive pulmonary disease with (acute) exacerbation: Secondary | ICD-10-CM | POA: Diagnosis not present

## 2017-07-04 DIAGNOSIS — J189 Pneumonia, unspecified organism: Principal | ICD-10-CM | POA: Diagnosis present

## 2017-07-04 LAB — BASIC METABOLIC PANEL
ANION GAP: 11 (ref 5–15)
BUN: 24 mg/dL — ABNORMAL HIGH (ref 6–20)
CALCIUM: 9 mg/dL (ref 8.9–10.3)
CO2: 25 mmol/L (ref 22–32)
Chloride: 103 mmol/L (ref 101–111)
Creatinine, Ser: 1.42 mg/dL — ABNORMAL HIGH (ref 0.61–1.24)
GFR, EST AFRICAN AMERICAN: 48 mL/min — AB (ref >60)
GFR, EST NON AFRICAN AMERICAN: 41 mL/min — AB (ref >60)
GLUCOSE: 138 mg/dL — AB (ref 65–99)
POTASSIUM: 3.8 mmol/L (ref 3.5–5.1)
SODIUM: 139 mmol/L (ref 135–145)

## 2017-07-04 LAB — RESPIRATORY PANEL BY PCR
Adenovirus: NOT DETECTED
BORDETELLA PERTUSSIS-RVPCR: NOT DETECTED
CHLAMYDOPHILA PNEUMONIAE-RVPPCR: NOT DETECTED
CORONAVIRUS 229E-RVPPCR: NOT DETECTED
Coronavirus HKU1: NOT DETECTED
Coronavirus NL63: NOT DETECTED
Coronavirus OC43: NOT DETECTED
INFLUENZA A-RVPPCR: NOT DETECTED
INFLUENZA B-RVPPCR: NOT DETECTED
MYCOPLASMA PNEUMONIAE-RVPPCR: NOT DETECTED
Metapneumovirus: NOT DETECTED
Parainfluenza Virus 1: NOT DETECTED
Parainfluenza Virus 2: NOT DETECTED
Parainfluenza Virus 3: NOT DETECTED
Parainfluenza Virus 4: NOT DETECTED
RESPIRATORY SYNCYTIAL VIRUS-RVPPCR: NOT DETECTED
Rhinovirus / Enterovirus: NOT DETECTED

## 2017-07-04 LAB — CBC WITH DIFFERENTIAL/PLATELET
BASOS ABS: 0 10*3/uL (ref 0.0–0.1)
BASOS PCT: 0 %
Eosinophils Absolute: 0 10*3/uL (ref 0.0–0.7)
Eosinophils Relative: 0 %
HEMATOCRIT: 46.1 % (ref 39.0–52.0)
Hemoglobin: 15 g/dL (ref 13.0–17.0)
LYMPHS PCT: 11 %
Lymphs Abs: 1.1 10*3/uL (ref 0.7–4.0)
MCH: 30.2 pg (ref 26.0–34.0)
MCHC: 32.5 g/dL (ref 30.0–36.0)
MCV: 92.8 fL (ref 78.0–100.0)
MONO ABS: 1 10*3/uL (ref 0.1–1.0)
Monocytes Relative: 11 %
NEUTROS ABS: 7.5 10*3/uL (ref 1.7–7.7)
Neutrophils Relative %: 78 %
PLATELETS: 192 10*3/uL (ref 150–400)
RBC: 4.97 MIL/uL (ref 4.22–5.81)
RDW: 15.5 % (ref 11.5–15.5)
WBC: 9.7 10*3/uL (ref 4.0–10.5)

## 2017-07-04 LAB — TROPONIN I
TROPONIN I: 0.08 ng/mL — AB (ref <0.03)
TROPONIN I: 0.08 ng/mL — AB (ref <0.03)
TROPONIN I: 0.09 ng/mL — AB (ref <0.03)
Troponin I: 0.06 ng/mL (ref <0.03)

## 2017-07-04 LAB — BRAIN NATRIURETIC PEPTIDE: B Natriuretic Peptide: 179 pg/mL — ABNORMAL HIGH (ref 0.0–100.0)

## 2017-07-04 LAB — EXPECTORATED SPUTUM ASSESSMENT W REFEX TO RESP CULTURE: SPECIAL REQUESTS: NORMAL

## 2017-07-04 LAB — PROTIME-INR
INR: 1.73
Prothrombin Time: 20.1 seconds — ABNORMAL HIGH (ref 11.4–15.2)

## 2017-07-04 LAB — ECHOCARDIOGRAM COMPLETE
Height: 67 in
Weight: 2874.7984 oz

## 2017-07-04 LAB — MRSA PCR SCREENING: MRSA BY PCR: NEGATIVE

## 2017-07-04 LAB — EXPECTORATED SPUTUM ASSESSMENT W GRAM STAIN, RFLX TO RESP C

## 2017-07-04 LAB — STREP PNEUMONIAE URINARY ANTIGEN: Strep Pneumo Urinary Antigen: NEGATIVE

## 2017-07-04 LAB — C DIFFICILE QUICK SCREEN W PCR REFLEX
C DIFFICILE (CDIFF) TOXIN: NEGATIVE
C Diff antigen: POSITIVE — AB

## 2017-07-04 LAB — CLOSTRIDIUM DIFFICILE BY PCR, REFLEXED: CDIFFPCR: NEGATIVE

## 2017-07-04 LAB — LACTIC ACID, PLASMA: LACTIC ACID, VENOUS: 1.4 mmol/L (ref 0.5–1.9)

## 2017-07-04 MED ORDER — ALBUTEROL SULFATE (2.5 MG/3ML) 0.083% IN NEBU
2.5000 mg | INHALATION_SOLUTION | Freq: Four times a day (QID) | RESPIRATORY_TRACT | Status: DC
  Administered 2017-07-04 – 2017-07-05 (×7): 2.5 mg via RESPIRATORY_TRACT
  Filled 2017-07-04 (×6): qty 3

## 2017-07-04 MED ORDER — METHYLPREDNISOLONE SODIUM SUCC 125 MG IJ SOLR: 80.0000 mg | Freq: Once | INTRAMUSCULAR | Status: DC

## 2017-07-04 MED ORDER — SODIUM CHLORIDE 0.9% FLUSH: 3.0000 mL | INTRAVENOUS | Status: DC | PRN

## 2017-07-04 MED ORDER — TAMSULOSIN HCL 0.4 MG PO CAPS
0.4000 mg | ORAL_CAPSULE | Freq: Every day | ORAL | Status: DC
  Administered 2017-07-04 – 2017-07-08 (×5): 0.4 mg via ORAL
  Filled 2017-07-04 (×5): qty 1

## 2017-07-04 MED ORDER — LIDOCAINE 5 % EX PTCH
1.0000 | MEDICATED_PATCH | Freq: Every day | CUTANEOUS | Status: DC | PRN
  Filled 2017-07-04: qty 1

## 2017-07-04 MED ORDER — DEXTROSE 5 % IV SOLN
500.0000 mg | Freq: Once | INTRAVENOUS | Status: AC
  Administered 2017-07-04: 500 mg via INTRAVENOUS
  Filled 2017-07-04: qty 500

## 2017-07-04 MED ORDER — TEMAZEPAM 15 MG PO CAPS
30.0000 mg | ORAL_CAPSULE | Freq: Every day | ORAL | Status: DC
  Administered 2017-07-04 – 2017-07-08 (×5): 30 mg via ORAL
  Filled 2017-07-04 (×5): qty 2

## 2017-07-04 MED ORDER — POTASSIUM CHLORIDE CRYS ER 20 MEQ PO TBCR
20.0000 meq | EXTENDED_RELEASE_TABLET | Freq: Every day | ORAL | Status: DC
  Administered 2017-07-04 – 2017-07-09 (×6): 20 meq via ORAL
  Filled 2017-07-04 (×6): qty 1

## 2017-07-04 MED ORDER — ORAL CARE MOUTH RINSE
15.0000 mL | Freq: Two times a day (BID) | OROMUCOSAL | Status: DC
  Administered 2017-07-04 – 2017-07-09 (×8): 15 mL via OROMUCOSAL

## 2017-07-04 MED ORDER — WARFARIN - PHARMACIST DOSING INPATIENT
Freq: Every day | Status: DC
  Administered 2017-07-05 – 2017-07-06 (×2)

## 2017-07-04 MED ORDER — METHYLPREDNISOLONE SODIUM SUCC 125 MG IJ SOLR
80.0000 mg | Freq: Three times a day (TID) | INTRAMUSCULAR | Status: DC
  Administered 2017-07-04 – 2017-07-06 (×7): 80 mg via INTRAVENOUS
  Filled 2017-07-04 (×7): qty 2

## 2017-07-04 MED ORDER — FINASTERIDE 5 MG PO TABS
5.0000 mg | ORAL_TABLET | Freq: Every day | ORAL | Status: DC
  Administered 2017-07-04 – 2017-07-09 (×6): 5 mg via ORAL
  Filled 2017-07-04 (×6): qty 1

## 2017-07-04 MED ORDER — DEXTROSE 5 % IV SOLN
1.0000 g | INTRAVENOUS | Status: DC
  Administered 2017-07-05 – 2017-07-09 (×5): 1 g via INTRAVENOUS
  Filled 2017-07-04 (×6): qty 10

## 2017-07-04 MED ORDER — TRAMADOL HCL 50 MG PO TABS: 50.0000 mg | ORAL_TABLET | Freq: Two times a day (BID) | ORAL | Status: DC | PRN

## 2017-07-04 MED ORDER — TIOTROPIUM BROMIDE MONOHYDRATE 18 MCG IN CAPS
18.0000 ug | ORAL_CAPSULE | Freq: Every day | RESPIRATORY_TRACT | Status: DC
  Administered 2017-07-04 – 2017-07-09 (×6): 18 ug via RESPIRATORY_TRACT
  Filled 2017-07-04 (×3): qty 5

## 2017-07-04 MED ORDER — SODIUM CHLORIDE 0.9 % IV SOLN
250.0000 mL | INTRAVENOUS | Status: DC | PRN
  Administered 2017-07-05: 250 mL via INTRAVENOUS

## 2017-07-04 MED ORDER — IPRATROPIUM-ALBUTEROL 0.5-2.5 (3) MG/3ML IN SOLN
3.0000 mL | Freq: Once | RESPIRATORY_TRACT | Status: AC
  Administered 2017-07-04: 3 mL via RESPIRATORY_TRACT
  Filled 2017-07-04: qty 3

## 2017-07-04 MED ORDER — ALBUTEROL SULFATE (2.5 MG/3ML) 0.083% IN NEBU: 2.5000 mg | INHALATION_SOLUTION | Freq: Four times a day (QID) | RESPIRATORY_TRACT | Status: DC | PRN

## 2017-07-04 MED ORDER — METOPROLOL TARTRATE 25 MG PO TABS
12.5000 mg | ORAL_TABLET | Freq: Two times a day (BID) | ORAL | Status: DC
  Administered 2017-07-04 – 2017-07-09 (×11): 12.5 mg via ORAL
  Filled 2017-07-04 (×12): qty 1

## 2017-07-04 MED ORDER — FUROSEMIDE 20 MG PO TABS
20.0000 mg | ORAL_TABLET | Freq: Every day | ORAL | Status: DC
  Administered 2017-07-04 – 2017-07-05 (×2): 20 mg via ORAL
  Filled 2017-07-04 (×2): qty 1

## 2017-07-04 MED ORDER — CEFTRIAXONE SODIUM 1 G IJ SOLR
1.0000 g | Freq: Once | INTRAMUSCULAR | Status: AC
  Administered 2017-07-04: 1 g via INTRAVENOUS
  Filled 2017-07-04: qty 10

## 2017-07-04 MED ORDER — DEXTROSE 5 % IV SOLN
500.0000 mg | INTRAVENOUS | Status: DC
  Administered 2017-07-05 – 2017-07-08 (×4): 500 mg via INTRAVENOUS
  Filled 2017-07-04 (×4): qty 500

## 2017-07-04 MED ORDER — SODIUM CHLORIDE 0.9% FLUSH
3.0000 mL | Freq: Two times a day (BID) | INTRAVENOUS | Status: DC
  Administered 2017-07-04 – 2017-07-08 (×6): 3 mL via INTRAVENOUS

## 2017-07-04 MED ORDER — WARFARIN SODIUM 3 MG PO TABS
3.0000 mg | ORAL_TABLET | Freq: Once | ORAL | Status: AC
  Administered 2017-07-04: 3 mg via ORAL
  Filled 2017-07-04: qty 1

## 2017-07-04 NOTE — H&P (Addendum)
TRH H&P   Patient Demographics:    Ricardo Perkins, is a 83 y.o. male  MRN: [id number]   DOB - 04-06-1923  Admit Date - 07/04/2017  Outpatient Primary MD for the patient is [name], MD  Referring MD/NP/PA: Veryl Speak  Outpatient Specialists:  Kirk Ruths  Patient coming from: home  Chief Complaint  Patient presents with  . productive cough      HPI:    Ricardo Perkins  is a 83 y.o. male, w  CKD stage 3  Pafib, CHF per daughter (EF 60-65% ), mild AR,  Copd on home o2,  Presents w c/o cough (dry),  Since Wednesday, and dyspnea since Thursday along with wheezing as well as fever starting yesterday. Pt has had chronic diarrhea since last admission. Pt denies n/v, abd pain, constipation, brbpr, black stool.    In ED, pt and daughter confirm DNR.    CXR  IMPRESSION: Lungs hypoexpanded. Bibasilar airspace opacities raise concern for pneumonia. Underlying vascular congestion noted.  Trop 0.08  Na 139, K 3.8,  Bun 24, Creatine 1.42 Glucose 138 Wbc 9.7, Hgb 15.0, Plt  192  Pt will be admitted for CAP , and possible Copd exacerbation and diarrhea.     Review of systems:    In addition to the HPI above,  No Fever-chills, No Headache, No changes with Vision or hearing, No problems swallowing food or Liquids, No Chest pain No Abdominal pain, No Nausea or Vommitting, Bowel movements are regular, No Blood in stool or Urine, No dysuria, No new skin rashes or bruises, No new joints pains-aches,  No new weakness, tingling, numbness in any extremity, No recent weight gain or loss, No polyuria, polydypsia or polyphagia, No significant Mental Stressors.  A full 10 point Review of Systems was done, except as stated above, all other Review of Systems were negative.   With Past History of the following :    Past Medical History:  Diagnosis Date  . Advanced  age   . Arthritis    knees, shoulders   . ASTEATOTIC ECZEMA   . BENIGN PROSTATIC HYPERTROPHY, WITH OBSTRUCTION   . BRADYCARDIA    1st degree AVB  . COPD (chronic obstructive pulmonary disease) (HCC)    on chronic oxygen therapy qhs>daytime  . DECREASED HEARING   . Hx of cardiovascular stress test    a.  MV 4/09:  possible small area of lateral ischemia at the apex, but Dr. Kirk Ruths reviewed this, and in fact, felt it was most likely normal.  b.  Myoview 3/13 was again low risk with an EF of 62% with small reversible defect in the apex suggestive of very mild apical ischemia.  Marland Kitchen Hx of echocardiogram    a. echo in 2009 (in [place]): normal EF, mild LVH;   b. echo 10/13:  mid Ant HK, mod LVH, EF 55%, trivial AI, mod  LAE, mild RVE, mild reduced RVSF, PASP 46  . HYPERTENSION   . INSOMNIA, PERSISTENT   . Large bilateral inguinal hernias - chronic, containing small bowel (right) and sigmoid colon (left) 01/15/2011   s/p B repair  . LEG EDEMA, BILATERAL   . OA (osteoarthritis) of knee    S/p right TKR March '13 (Dr. Wynelle Link)   . PAROXYSMAL ATRIAL FIBRILLATION    coumadin rx  . RENAL INSUFFICIENCY   . RHINITIS, VASOMOTOR 01/07/2010      Past Surgical History:  Procedure Laterality Date  . APPENDECTOMY     @ 23  . EYE SURGERY     bilateral cataract surgery   . HERNIA REPAIR  01/2011   bilateral inguinal hernia   . KNEE SURGERY  right  . OTHER SURGICAL HISTORY     left finger surgery   . TONSILLECTOMY    . TOTAL KNEE ARTHROPLASTY  10/26/2011   Procedure: TOTAL KNEE ARTHROPLASTY;  Surgeon: Gearlean Alf, MD;  Location: [facility];  Service: Orthopedics;  Laterality: Right;      Social History:     Social History   Tobacco Use  . Smoking status: Former Smoker    Packs/day: 1.00    Years: 30.00    Pack years: 30.00    Types: Cigarettes    Last attempt to quit: 07/20/1998    Years since quitting: 18.9  . Smokeless tobacco: Never Used  Substance Use Topics  . Alcohol use: Yes     Alcohol/week: 0.0 oz    Comment: wine occasional      Lives -  At home Mobility -  Walks by self  Family History :     Family History  Problem Relation Age of Onset  . Heart disease Father   . Heart attack Father   . Cancer Mother        colon   . Cirrhosis Sister        liver failure  . Heart attack Brother   . Diabetes Neg Hx       Home Medications:   Prior to Admission medications   Medication Sig Start Date End Date Taking? Authorizing Provider  albuterol (PROVENTIL) (2.5 MG/3ML) 0.083% nebulizer solution Take 1 ampule by nebulization every 6 (six) hours as needed for wheezing.   Yes [provider]  amLODipine (NORVASC) 10 MG tablet Take 1 tablet (10 mg total) by mouth daily. 05/07/17  Yes [name], MD  finasteride (PROSCAR) 5 MG tablet TAKE 1 TABLET(5 MG) BY MOUTH DAILY 05/07/17  Yes [name], MD  furosemide (LASIX) 20 MG tablet Take 1 tablet (20 mg total) by mouth daily. 05/07/17  Yes [name], MD  lidocaine (LIDODERM) 5 % Place 1 patch onto the skin daily. Remove & Discard patch within 12 hours or as directed by MD Patient taking differently: Place 1 patch onto the skin daily as needed (pain). Remove & Discard patch within 12 hours or as directed by MD 09/13/15  Yes [name], MD  metoprolol tartrate (LOPRESSOR) 25 MG tablet Take 0.5 tablets (12.5 mg total) by mouth 2 (two) times daily. 05/07/17  Yes [name], MD  potassium chloride SA (K-DUR,KLOR-CON) 20 MEQ tablet Take 1 tablet (20 mEq total) by mouth daily. 05/07/17  Yes [name], MD  predniSONE (DELTASONE) 10 MG tablet Take 1 tablet (10 mg total) by mouth daily with breakfast. 05/07/17  Yes [name], MD  tamsulosin (FLOMAX) 0.4 MG CAPS  capsule Take 1 capsule (0.4 mg total) by mouth daily after supper. 05/07/17  Yes [name], MD  temazepam (RESTORIL) 30 MG capsule TAKE ONE CAPSULE BY MOUTH EVERY NIGHT AT  BEDTIME 04/28/17  Yes [name], MD  traMADol (ULTRAM) 50 MG tablet Take 1 tablet (50 mg total) by mouth every 12 (twelve) hours as needed. Patient taking differently: Take 50 mg by mouth every morning.  05/07/17  Yes [name], MD  warfarin (COUMADIN) 3 MG tablet AS DIRECTED Patient taking differently: Take 1.5mg  Every day except Monday. On Mondays, take 3mg . 05/07/17  Yes [name], MD     Allergies:    No Known Allergies   Physical Exam:   Vitals  Blood pressure (!) 114/50, pulse 79, temperature 98.1 F (36.7 C), temperature source Oral, resp. rate (!) 33, SpO2 92 %.   1. General  lying in bed in NAD,    2. Normal affect and insight, Not Suicidal or Homicidal, Awake Alert, Oriented X 3.  3. No F.N deficits, ALL C.Nerves Intact, Strength 5/5 all 4 extremities, Sensation intact all 4 extremities, Plantars down going.  4. Ears and Eyes appear Normal, Conjunctivae clear, PERRLA. Moist Oral Mucosa.  5. Supple Neck, No JVD, No cervical lymphadenopathy appriciated, No Carotid Bruits.  6. Symmetrical Chest wall movement, Good air movement bilaterally, crackles right lung base, slight bilateral exp wheezing.   7.  Irr, irr s1, s2, 1/6 sem rusb.  8. Positive Bowel Sounds, Abdomen Soft, No tenderness, No organomegaly appriciated,No rebound -guarding or rigidity.  9.  No Cyanosis, Normal Skin Turgor, No Skin Rash or Bruise.  10. Good muscle tone,  joints appear normal , no effusions, Normal ROM.  11. No Palpable Lymph Nodes in Neck or Axillae     Data Review:    CBC Recent Labs  Lab 07/04/17 0529  WBC 9.7  HGB 15.0  HCT 46.1  PLT 192  MCV 92.8  MCH 30.2  MCHC 32.5  RDW 15.5  LYMPHSABS 1.1  MONOABS 1.0  EOSABS 0.0  BASOSABS 0.0   ------------------------------------------------------------------------------------------------------------------  Chemistries  Recent Labs  Lab 07/04/17 0529  NA 139  K 3.8  CL 103  CO2 25    GLUCOSE 138*  BUN 24*  CREATININE 1.42*  CALCIUM 9.0   ------------------------------------------------------------------------------------------------------------------ CrCl cannot be calculated (Unknown ideal weight.). ------------------------------------------------------------------------------------------------------------------ No results for input(s): TSH, T4TOTAL, T3FREE, THYROIDAB in the last 72 hours.  Invalid input(s): FREET3  Coagulation profile No results for input(s): INR, PROTIME in the last 168 hours. ------------------------------------------------------------------------------------------------------------------- No results for input(s): DDIMER in the last 72 hours. -------------------------------------------------------------------------------------------------------------------  Cardiac Enzymes Recent Labs  Lab 07/04/17 0529  TROPONINI 0.08*   ------------------------------------------------------------------------------------------------------------------    Component Value Date/Time   BNP 179.0 (H) 07/04/2017 0530     ---------------------------------------------------------------------------------------------------------------  Urinalysis    Component Value Date/Time   COLORURINE YELLOW 03/26/2017 0118   APPEARANCEUR CLEAR 03/26/2017 0118   LABSPEC 1.019 03/26/2017 0118   PHURINE 5.0 03/26/2017 0118   GLUCOSEU NEGATIVE 03/26/2017 0118   HGBUR NEGATIVE 03/26/2017 0118   BILIRUBINUR NEGATIVE 03/26/2017 0118   KETONESUR NEGATIVE 03/26/2017 0118   PROTEINUR 30 (A) 03/26/2017 0118   UROBILINOGEN 0.2 02/26/2012 1000   NITRITE NEGATIVE 03/26/2017 0118   LEUKOCYTESUR NEGATIVE 03/26/2017 0118    ----------------------------------------------------------------------------------------------------------------   Imaging Results:    Dg Chest 2 View  Result Date: 07/04/2017 CLINICAL DATA:  Acute onset of productive cough. Decreased O2 saturation.  EXAM: CHEST  2 VIEW COMPARISON:  Chest radiograph performed 03/27/2017 FINDINGS: The lungs are hypoexpanded. Bibasilar airspace opacities raise concern for pneumonia. Underlying vascular crowding and vascular congestion are noted. No pleural effusion or pneumothorax is seen. The cardiomediastinal silhouette is borderline normal in size. No acute osseous abnormalities are identified. IMPRESSION: Lungs hypoexpanded. Bibasilar airspace opacities raise concern for pneumonia. Underlying vascular congestion noted. Electronically Signed   By: Garald Balding M.D.   On: 07/04/2017 05:27   ekg pending   Assessment & Plan:    Active Problems:   PAROXYSMAL ATRIAL FIBRILLATION   CKD (chronic kidney disease) stage 3, GFR 30-59 ml/min (HCC)   CAP (community acquired pneumonia)   Tachycardia   Elevated troponin   Diarrhea    CAP Blood culture x2 Sputum gram stain, culture resp viral panel Rocephin 1gm iv qday, zithromax 500mg  iv qday DNR confirmed Consider palliative care if not improving  Copd exacerbation spiriva 1puff qday Solumedrol 80mg  iv q8h Cont iv abx Albuterol 1 neb q6h and q6h prn   Tachycardia Tele Trop I q6h x3 Check cardiac echo  CKD stage3 Check cmp in am  Pafib Coumadin pharmacy to dose Cont metoprolol  CHF (diastolic) Cont lasix Cont potassium  Diarrhea Stool studies, GI pathogen panel, C. Diff  DVT Prophylaxis  Coumadin pharmacy to dose  AM Labs Ordered, also please review Full Orders  Family Communication: Admission, patients condition and plan of care including tests being ordered have been discussed with the patient and daughter Jocelyn Lamer  who indicate understanding and agree with the plan and Code Status.  Code Status DNR  Likely DC to  home  Condition GUARDED    Consults called: none  Admission status: inpatient  Time spent in minutes : 45   Jani Gravel M.D on 07/04/2017 at 8:43 AM  Between 7am to 7pm - Pager - [phone]   After 7pm go to  [email or web address] - password [facility]  [facility] - Office  [phone]

## 2017-07-04 NOTE — Progress Notes (Signed)
Pharmacy: Re- warfarin  Patient's a 83 y.o with hx afib on warfarin PTA, presented to the ED on 12/16 with c/o cough.  Warfarin resumed on admission.  - home warfarin regimen: 1.5 mg daily except 3 mg on Mon   - Today's INR is subtherapeutic at 1.73 (goal 2-3)  Plan: - warfarin 3 mg PO x1 today - daily INR - monitor for s/s bleeding  Dia Sitter, PharmD, BCPS 07/04/2017 3:56 PM

## 2017-07-04 NOTE — ED Triage Notes (Signed)
Per EMS, pt from home with complaint of productive cough x4 days, per pt.'s daughter phlegm was yellowish/greenish. No report of fever nor chest pain. Pt. Has COPD and using O2 at home at  4l/min., denied SOB. Alert and oriented x2-3. Did not receive his Flu vaccination for this year.

## 2017-07-04 NOTE — ED Notes (Signed)
Hospitalist at bedside 

## 2017-07-04 NOTE — Progress Notes (Signed)
  Echocardiogram 2D Echocardiogram has been performed.  Jennette Dubin 07/04/2017, 3:22 PM

## 2017-07-04 NOTE — ED Notes (Signed)
Pt cleaned and condom cath placed on at patients requested.

## 2017-07-04 NOTE — ED Provider Notes (Signed)
[facility] DEPT Provider Note   CSN: [id number] Arrival date & time: 07/04/17  0429     History   Chief Complaint Chief Complaint  Patient presents with  . productive cough    HPI Ricardo Perkins is a 83 y.o. male.  Patient is a 83 year old male with past medical history of COPD, CHF, paroxysmal A. fib.  He presents today for evaluation of weakness and shortness of breath.  This is worsened over the past 2 days.  He has had a nonproductive cough.  Daughter at bedside reports that he has had low-grade fevers at home.  He is normally ambulatory, however he has been too weak to walk since yesterday.  His daughter reports that he is on oxygen at home and his saturations have been consistently in the mid 80s despite his 4 L of oxygen that he normally wears.   The history is provided by the patient.  Shortness of Breath  This is a new problem. The average episode lasts 2 days. The problem occurs continuously.The problem has been gradually worsening. Associated symptoms include a fever and cough. Pertinent negatives include no sputum production, no chest pain, no leg pain and no leg swelling.    Past Medical History:  Diagnosis Date  . Advanced age   . Arthritis    knees, shoulders   . ASTEATOTIC ECZEMA   . BENIGN PROSTATIC HYPERTROPHY, WITH OBSTRUCTION   . BRADYCARDIA    1st degree AVB  . COPD (chronic obstructive pulmonary disease) (HCC)    on chronic oxygen therapy qhs>daytime  . DECREASED HEARING   . Hx of cardiovascular stress test    a.  MV 4/09:  possible small area of lateral ischemia at the apex, but Dr. Kirk Ruths reviewed this, and in fact, felt it was most likely normal.  b.  Myoview 3/13 was again low risk with an EF of 62% with small reversible defect in the apex suggestive of very mild apical ischemia.  Marland Kitchen Hx of echocardiogram    a. echo in 2009 (in [place]): normal EF, mild LVH;   b. echo 10/13:  mid Ant HK, mod LVH, EF 55%, trivial  AI, mod LAE, mild RVE, mild reduced RVSF, PASP 46  . HYPERTENSION   . INSOMNIA, PERSISTENT   . Large bilateral inguinal hernias - chronic, containing small bowel (right) and sigmoid colon (left) 01/15/2011   s/p B repair  . LEG EDEMA, BILATERAL   . OA (osteoarthritis) of knee    S/p right TKR March '13 (Dr. Wynelle Link)   . PAROXYSMAL ATRIAL FIBRILLATION    coumadin rx  . RENAL INSUFFICIENCY   . RHINITIS, VASOMOTOR 01/07/2010    Patient Active Problem List   Diagnosis Date Noted  . Long term (current) use of anticoagulants 04/01/2017  . Generalized weakness 03/26/2017  . Recurrent falls 03/26/2017  . Fall   . Encounter for therapeutic drug monitoring 07/16/2016  . Tinnitus 03/13/2016  . Solitary pulmonary nodule 12/20/2015  . Congestive dilated cardiomyopathy ([place]) 11/04/2015  . CKD (chronic kidney disease) stage 3, GFR 30-59 ml/min (HCC) 10/03/2015  . BPH (benign prostatic hyperplasia) 10/03/2015  . PAROXYSMAL ATRIAL FIBRILLATION   . Post herpetic neuralgia 09/01/2015  . Postinflammatory pulmonary fibrosis ([place]) 02/12/2015  . Raynaud phenomenon 06/21/2014  . Essential hypertension 07/23/2008    Past Surgical History:  Procedure Laterality Date  . APPENDECTOMY     @ 23  . EYE SURGERY     bilateral cataract surgery   .  HERNIA REPAIR  01/2011   bilateral inguinal hernia   . KNEE SURGERY  right  . OTHER SURGICAL HISTORY     left finger surgery   . TONSILLECTOMY    . TOTAL KNEE ARTHROPLASTY  10/26/2011   Procedure: TOTAL KNEE ARTHROPLASTY;  Surgeon: Gearlean Alf, MD;  Location: [facility];  Service: Orthopedics;  Laterality: Right;       Home Medications    Prior to Admission medications   Medication Sig Start Date End Date Taking? Authorizing Provider  amLODipine (NORVASC) 10 MG tablet Take 1 tablet (10 mg total) by mouth daily. 05/07/17   [name], MD  finasteride (PROSCAR) 5 MG tablet TAKE 1 TABLET(5 MG) BY MOUTH DAILY 05/07/17   [name], MD    furosemide (LASIX) 20 MG tablet Take 1 tablet (20 mg total) by mouth daily. 05/07/17   [name], MD  lidocaine (LIDODERM) 5 % Place 1 patch onto the skin daily. Remove & Discard patch within 12 hours or as directed by MD Patient taking differently: Place 1 patch onto the skin daily as needed (pain). Remove & Discard patch within 12 hours or as directed by MD 09/13/15   [name], MD  metoprolol tartrate (LOPRESSOR) 25 MG tablet Take 0.5 tablets (12.5 mg total) by mouth 2 (two) times daily. 05/07/17   [name], MD  potassium chloride SA (K-DUR,KLOR-CON) 20 MEQ tablet Take 1 tablet (20 mEq total) by mouth daily. 05/07/17   [name], MD  predniSONE (DELTASONE) 10 MG tablet Take 1 tablet (10 mg total) by mouth daily with breakfast. 05/07/17   [name], MD  tamsulosin (FLOMAX) 0.4 MG CAPS capsule Take 1 capsule (0.4 mg total) by mouth daily after supper. 05/07/17   [name], MD  temazepam (RESTORIL) 30 MG capsule TAKE ONE CAPSULE BY MOUTH EVERY NIGHT AT BEDTIME 04/28/17   [name], MD  traMADol (ULTRAM) 50 MG tablet Take 1 tablet (50 mg total) by mouth every 12 (twelve) hours as needed. 05/07/17   [name], MD  warfarin (COUMADIN) 3 MG tablet AS DIRECTED 05/07/17   [name], MD    Family History Family History  Problem Relation Age of Onset  . Heart disease Father   . Heart attack Father   . Cancer Mother        colon   . Cirrhosis Sister        liver failure  . Heart attack Brother   . Diabetes Neg Hx     Social History Social History   Tobacco Use  . Smoking status: Former Smoker    Packs/day: 1.00    Years: 30.00    Pack years: 30.00    Types: Cigarettes    Last attempt to quit: 07/20/1998    Years since quitting: 18.9  . Smokeless tobacco: Never Used  Substance Use Topics  . Alcohol use: Yes    Alcohol/week: 0.0 oz    Comment: wine occasional   . Drug use: No      Allergies   Patient has no known allergies.   Review of Systems Review of Systems  Constitutional: Positive for fever.  Respiratory: Positive for cough and shortness of breath. Negative for sputum production.   Cardiovascular: Negative for chest pain and leg swelling.  All other systems reviewed and are negative.    Physical Exam Updated Vital Signs BP 125/70   Pulse 60   Temp 98.1 F (36.7  C) (Oral)   Resp 20   SpO2 95%   Physical Exam  Constitutional: He is oriented to person, place, and time. He appears well-developed and well-nourished. No distress.  HENT:  Head: Normocephalic and atraumatic.  Mouth/Throat: Oropharynx is clear and moist.  Neck: Normal range of motion. Neck supple.  Cardiovascular: Normal rate and regular rhythm. Exam reveals no friction rub.  No murmur heard. Pulmonary/Chest: Effort normal. No respiratory distress. He has no wheezes. He has rales.  There are slight rales in the bases bilaterally.  Abdominal: Soft. Bowel sounds are normal. He exhibits no distension. There is no tenderness.  Musculoskeletal: Normal range of motion. He exhibits no edema.  Neurological: He is alert and oriented to person, place, and time. Coordination normal.  Skin: Skin is warm and dry. He is not diaphoretic.  Nursing note and vitals reviewed.    ED Treatments / Results  Labs (all labs ordered are listed, but only abnormal results are displayed) Labs Reviewed - No data to display  EKG ED ECG REPORT   Date: 07/04/2017  Rate: 138  Rhythm: atrial fibrillation  QRS Axis: left  Intervals: normal  ST/T Wave abnormalities: nonspecific T wave changes  Conduction Disutrbances:nonspecific intraventricular conduction delay  Narrative Interpretation:   Old EKG Reviewed: none available  I have personally reviewed the EKG tracing and agree with the computerized printout as noted.   Radiology No results found.  Procedures Procedures (including critical care  time)  Medications Ordered in ED Medications  ipratropium-albuterol (DUONEB) 0.5-2.5 (3) MG/3ML nebulizer solution 3 mL (not administered)     Initial Impression / Assessment and Plan / ED Course  I have reviewed the triage vital signs and the nursing notes.  Pertinent labs & imaging results that were available during my care of the patient were reviewed by me and considered in my medical decision making (see chart for details).  Patient with history of COPD, CHF, A. fib presenting with cough and difficulty breathing.  He was hypoxic at home and here in the ER.  His workup is consistent with bilateral pneumonia.  This was seen on his chest x-ray.  He was given Rocephin and Zithromax along with a DuoNeb.  His BNP is not significantly elevated and this appears to be more infectious than CHF.  I have spoken with Dr. Maudie Mercury who will admit.  Final Clinical Impressions(s) / ED Diagnoses   Final diagnoses:  None    ED Discharge Orders    None       Veryl Speak, MD 07/04/17 [phone]

## 2017-07-04 NOTE — ED Notes (Addendum)
Date and time results received: 07/04/17 0659  Test:Troponin Critical Value:0.08  Name of Provider Notified: Dr. Stark Jock  Orders Received? Or Actions Taken?: see orders

## 2017-07-04 NOTE — Progress Notes (Signed)
ANTICOAGULATION CONSULT NOTE - Initial Consult  Pharmacy Consult for warfarin Indication: atrial fibrillation  No Known Allergies  Patient Measurements: Height: 5\' 7"  (170.2 cm) Weight: 179 lb 10.8 oz (81.5 kg) IBW/kg (Calculated) : 66.1 Heparin Dosing Weight:   Vital Signs: Temp: 97.7 F (36.5 C) (12/16 0855) Temp Source: Oral (12/16 0855) BP: 152/81 (12/16 1136) Pulse Rate: 106 (12/16 1136)  Labs: Recent Labs    07/04/17 0529 07/04/17 1120  HGB 15.0  --   HCT 46.1  --   PLT 192  --   CREATININE 1.42*  --   TROPONINI 0.08* 0.08*    Estimated Creatinine Clearance: 33.9 mL/min (A) (by C-G formula based on SCr of 1.42 mg/dL (H)).   Medical History: Past Medical History:  Diagnosis Date  . Advanced age   . Arthritis    knees, shoulders   . ASTEATOTIC ECZEMA   . BENIGN PROSTATIC HYPERTROPHY, WITH OBSTRUCTION   . BRADYCARDIA    1st degree AVB  . COPD (chronic obstructive pulmonary disease) (HCC)    on chronic oxygen therapy qhs>daytime  . DECREASED HEARING   . Hx of cardiovascular stress test    a.  MV 4/09:  possible small area of lateral ischemia at the apex, but Dr. Kirk Ruths reviewed this, and in fact, felt it was most likely normal.  b.  Myoview 3/13 was again low risk with an EF of 62% with small reversible defect in the apex suggestive of very mild apical ischemia.  Marland Kitchen Hx of echocardiogram    a. echo in 2009 (in [place]): normal EF, mild LVH;   b. echo 10/13:  mid Ant HK, mod LVH, EF 55%, trivial AI, mod LAE, mild RVE, mild reduced RVSF, PASP 46  . HYPERTENSION   . INSOMNIA, PERSISTENT   . Large bilateral inguinal hernias - chronic, containing small bowel (right) and sigmoid colon (left) 01/15/2011   s/p B repair  . LEG EDEMA, BILATERAL   . OA (osteoarthritis) of knee    S/p right TKR March '13 (Dr. Wynelle Link)   . PAROXYSMAL ATRIAL FIBRILLATION    coumadin rx  . RENAL INSUFFICIENCY   . RHINITIS, VASOMOTOR 01/07/2010    Assessment: 36 YOM presents  with productive cough and suspected CAP. He is on warfarin prior to admission for h/o atrial fibrillation.  Pharmacy asked to dose warfarin while in hospital.  Home warfarin dosing: 1.5mg  daily except 3mg  on Mon.  Appears has missed last several doses  Today, 07/04/2017  No INR ordered in ED  CBC: Hgb and pltc WNL  Goal of Therapy:  INR 2-3   Plan:   Await baseline INR (ordered)  Daily INR  Doreene Eland, PharmD, BCPS.   Pager: [phone] 07/04/2017 12:10 PM

## 2017-07-04 NOTE — ED Notes (Signed)
ED Provider at bedside. 

## 2017-07-04 NOTE — ED Notes (Signed)
Patient transported to X-ray 

## 2017-07-04 NOTE — Progress Notes (Signed)
Called to room for desaturation on HFNC at 15L.  Saturations currently 87.  Placed on NRB with saturations increasing to 92%.  Will continue to monitor.

## 2017-07-04 NOTE — ED Notes (Signed)
Family reports pt is DNR but form is at home

## 2017-07-04 NOTE — ED Notes (Signed)
ED TO INPATIENT HANDOFF REPORT  Name/Age/Gender Ricardo Perkins 83 y.o. male  Code Status Code Status History    Date Active Date Inactive Code Status Order ID Comments User Context   03/26/2017 17:30 03/30/2017 20:06 DNR [id number]  Sid Falcon, MD ED   03/26/2017 16:30 03/26/2017 17:30 Full Code [id number]  Rondel Jumbo, PA-C ED   10/03/2015 12:12 10/05/2015 15:53 Full Code [id number]  Robbie Lis, MD ED   10/19/2013 10:53 10/03/2015 12:12 DNR [id number] See Ok Edwards 10/19/13 Rowe Clack, MD Outpatient   11/06/2011 17:49 11/09/2011 13:33 Full Code [id number]  Thomas Hoff, RN ED   10/26/2011 16:41 10/31/2011 16:57 Full Code [id number]  Reed Breech, RN Inpatient    Questions for Most Recent Historical Code Status (Order [id number])    Question Answer Comment   In the event of cardiac or respiratory ARREST Do not call a "code blue"    In the event of cardiac or respiratory ARREST Do not perform Intubation, CPR, defibrillation or ACLS    In the event of cardiac or respiratory ARREST Use medication by any route, position, wound care, and other measures to relive pain and suffering. May use oxygen, suction and manual treatment of airway obstruction as needed for comfort.       Home/SNF/Other Home  Chief Complaint sob .cough  Level of Care/Admitting Diagnosis ED Disposition    ED Disposition Condition [facility] Area: [place] [[id number]]  Level of Care: Stepdown [14]  Admit to SDU based on following criteria: Hemodynamic compromise or significant risk of instability:  Patient requiring short term acute titration and management of vasoactive drips, and invasive monitoring (i.e., CVP and Arterial line).  Diagnosis: CAP (community acquired pneumonia) [655374]  Admitting Physician: Jani Gravel [[id number]]  Attending Physician: Jani Gravel [phone]  Estimated length of stay: past midnight tomorrow  Certification:: I certify this patient will need inpatient  services for at least 2 midnights  PT Class (Do Not Modify): Inpatient [101]  PT Acc Code (Do Not Modify): Private [1]       Medical History Past Medical History:  Diagnosis Date  . Advanced age   . Arthritis    knees, shoulders   . ASTEATOTIC ECZEMA   . BENIGN PROSTATIC HYPERTROPHY, WITH OBSTRUCTION   . BRADYCARDIA    1st degree AVB  . COPD (chronic obstructive pulmonary disease) (HCC)    on chronic oxygen therapy qhs>daytime  . DECREASED HEARING   . Hx of cardiovascular stress test    a.  MV 4/09:  possible small area of lateral ischemia at the apex, but Dr. Kirk Ruths reviewed this, and in fact, felt it was most likely normal.  b.  Myoview 3/13 was again low risk with an EF of 62% with small reversible defect in the apex suggestive of very mild apical ischemia.  Marland Kitchen Hx of echocardiogram    a. echo in 2009 (in [place]): normal EF, mild LVH;   b. echo 10/13:  mid Ant HK, mod LVH, EF 55%, trivial AI, mod LAE, mild RVE, mild reduced RVSF, PASP 46  . HYPERTENSION   . INSOMNIA, PERSISTENT   . Large bilateral inguinal hernias - chronic, containing small bowel (right) and sigmoid colon (left) 01/15/2011   s/p B repair  . LEG EDEMA, BILATERAL   . OA (osteoarthritis) of knee    S/p right TKR March '13 (Dr. Wynelle Link)   . PAROXYSMAL ATRIAL FIBRILLATION    coumadin rx  .  RENAL INSUFFICIENCY   . RHINITIS, VASOMOTOR 01/07/2010    Allergies No Known Allergies  IV Location/Drains/Wounds Patient Lines/Drains/Airways Status   Active Line/Drains/Airways    Name:   Placement date:   Placement time:   Site:   Days:   Peripheral IV 07/04/17 Right Antecubital   07/04/17    0552    Antecubital   less than 1   Peripheral IV 07/04/17 Left Hand   07/04/17    0600    Hand   less than 1   External Urinary Catheter   -    -    -      Incision 10/26/11 Knee Right   10/26/11    1330     2078          Labs/Imaging Results for orders placed or performed during the hospital encounter of 07/04/17  (from the past 48 hour(s))  Basic metabolic panel     Status: Abnormal   Collection Time: 07/04/17  5:29 AM  Result Value Ref Range   Sodium 139 135 - 145 mmol/L   Potassium 3.8 3.5 - 5.1 mmol/L   Chloride 103 101 - 111 mmol/L   CO2 25 22 - 32 mmol/L   Glucose, Bld 138 (H) 65 - 99 mg/dL   BUN 24 (H) 6 - 20 mg/dL   Creatinine, Ser 1.42 (H) 0.61 - 1.24 mg/dL   Calcium 9.0 8.9 - 10.3 mg/dL   GFR calc non Af Amer 41 (L) >60 mL/min   GFR calc Af Amer 48 (L) >60 mL/min    Comment: (NOTE) The eGFR has been calculated using the CKD EPI equation. This calculation has not been validated in all clinical situations. eGFR's persistently <60 mL/min signify possible Chronic Kidney Disease.    Anion gap 11 5 - 15  CBC with Differential     Status: None   Collection Time: 07/04/17  5:29 AM  Result Value Ref Range   WBC 9.7 4.0 - 10.5 K/uL   RBC 4.97 4.22 - 5.81 MIL/uL   Hemoglobin 15.0 13.0 - 17.0 g/dL   HCT 46.1 39.0 - 52.0 %   MCV 92.8 78.0 - 100.0 fL   MCH 30.2 26.0 - 34.0 pg   MCHC 32.5 30.0 - 36.0 g/dL   RDW 15.5 11.5 - 15.5 %   Platelets 192 150 - 400 K/uL   Neutrophils Relative % 78 %   Neutro Abs 7.5 1.7 - 7.7 K/uL   Lymphocytes Relative 11 %   Lymphs Abs 1.1 0.7 - 4.0 K/uL   Monocytes Relative 11 %   Monocytes Absolute 1.0 0.1 - 1.0 K/uL   Eosinophils Relative 0 %   Eosinophils Absolute 0.0 0.0 - 0.7 K/uL   Basophils Relative 0 %   Basophils Absolute 0.0 0.0 - 0.1 K/uL  Troponin I     Status: Abnormal   Collection Time: 07/04/17  5:29 AM  Result Value Ref Range   Troponin I 0.08 (HH) <0.03 ng/mL    Comment: CRITICAL RESULT CALLED TO, READ BACK BY AND VERIFIED WITH: T.DOSTER RN 07/04/2017 0704 JR   Brain natriuretic peptide     Status: Abnormal   Collection Time: 07/04/17  5:30 AM  Result Value Ref Range   B Natriuretic Peptide 179.0 (H) 0.0 - 100.0 pg/mL  Lactic acid, plasma     Status: None   Collection Time: 07/04/17  5:30 AM  Result Value Ref Range   Lactic Acid,  Venous 1.4 0.5 - 1.9 mmol/L  Dg Chest 2 View  Result Date: 07/04/2017 CLINICAL DATA:  Acute onset of productive cough. Decreased O2 saturation. EXAM: CHEST  2 VIEW COMPARISON:  Chest radiograph performed 03/27/2017 FINDINGS: The lungs are hypoexpanded. Bibasilar airspace opacities raise concern for pneumonia. Underlying vascular crowding and vascular congestion are noted. No pleural effusion or pneumothorax is seen. The cardiomediastinal silhouette is borderline normal in size. No acute osseous abnormalities are identified. IMPRESSION: Lungs hypoexpanded. Bibasilar airspace opacities raise concern for pneumonia. Underlying vascular congestion noted. Electronically Signed   By: Garald Balding M.D.   On: 07/04/2017 05:27    Pending Labs [employer] (From admission, onward)   Start     Ordered   Signed and Held  HIV antibody  Once,   R     Signed and Held   Signed and Held  Culture, blood (routine x 2) Call MD if unable to obtain prior to antibiotics being given  BLOOD CULTURE X 2,   R    Comments:  If blood cultures drawn in Emergency Department - Do not draw and cancel order   Question:  Patient immune status  Answer:  Normal   Signed and Held   Signed and Held  Culture, sputum-assessment  Once,   R    Question:  Patient immune status  Answer:  Normal   Signed and Held   Signed and Held  Gram stain  Once,   R    Question:  Patient immune status  Answer:  Normal   Signed and Held   Signed and Held  Strep pneumoniae urinary antigen  Once,   R     Signed and Held   Signed and Held  Legionella Pneumophila Serogp 1 Ur Ag  Once,   R     Signed and Held   Signed and Held  Respiratory Panel by PCR  (Respiratory virus panel)  Once,   R    Question:  Patient immune status  Answer:  Normal   Signed and Held   Signed and Held  CBC  Tomorrow morning,   R     Signed and Held   Signed and Held  Comprehensive metabolic panel  Tomorrow morning,   R     Signed and Held       Vitals/Pain Today's Vitals   07/04/17 0638 07/04/17 0652 07/04/17 0709 07/04/17 0737  BP:   117/72 (!) 114/50  Pulse: 67 (!) 109 (!) 106 79  Resp: (!) 40 20 20 (!) 33  Temp:      TempSrc:      SpO2: 92% 90% 91% 92%    Isolation Precautions No active isolations  Medications Medications  azithromycin (ZITHROMAX) 500 mg in dextrose 5 % 250 mL IVPB (500 mg Intravenous New Bag/Given 07/04/17 0714)  methylPREDNISolone sodium succinate (SOLU-MEDROL) 125 mg/2 mL injection 80 mg (not administered)  albuterol (PROVENTIL) (2.5 MG/3ML) 0.083% nebulizer solution 2.5 mg (not administered)  ipratropium-albuterol (DUONEB) 0.5-2.5 (3) MG/3ML nebulizer solution 3 mL (3 mLs Nebulization Given 07/04/17 0541)  cefTRIAXone (ROCEPHIN) 1 g in dextrose 5 % 50 mL IVPB (0 g Intravenous Stopped 07/04/17 0729)    Mobility Normally ambulatory

## 2017-07-04 NOTE — ED Notes (Signed)
Bed: HE17 Expected date:  Expected time:  Means of arrival:  Comments: EMS 83 yo male productive cough-hx COPD O2 sat 92%-cold type symptoms

## 2017-07-05 DIAGNOSIS — R0902 Hypoxemia: Secondary | ICD-10-CM

## 2017-07-05 LAB — COMPREHENSIVE METABOLIC PANEL
ALBUMIN: 3.5 g/dL (ref 3.5–5.0)
ALK PHOS: 63 U/L (ref 38–126)
ALT: 14 U/L — AB (ref 17–63)
ANION GAP: 12 (ref 5–15)
AST: 16 U/L (ref 15–41)
BILIRUBIN TOTAL: 1.4 mg/dL — AB (ref 0.3–1.2)
BUN: 34 mg/dL — ABNORMAL HIGH (ref 6–20)
CALCIUM: 8.8 mg/dL — AB (ref 8.9–10.3)
CO2: 21 mmol/L — ABNORMAL LOW (ref 22–32)
CREATININE: 1.53 mg/dL — AB (ref 0.61–1.24)
Chloride: 106 mmol/L (ref 101–111)
GFR calc Af Amer: 44 mL/min — ABNORMAL LOW (ref >60)
GFR calc non Af Amer: 38 mL/min — ABNORMAL LOW (ref >60)
GLUCOSE: 205 mg/dL — AB (ref 65–99)
Potassium: 3.7 mmol/L (ref 3.5–5.1)
Sodium: 139 mmol/L (ref 135–145)
TOTAL PROTEIN: 6.7 g/dL (ref 6.5–8.1)

## 2017-07-05 LAB — CBC
HEMATOCRIT: 44.5 % (ref 39.0–52.0)
HEMOGLOBIN: 14.7 g/dL (ref 13.0–17.0)
MCH: 30.5 pg (ref 26.0–34.0)
MCHC: 33 g/dL (ref 30.0–36.0)
MCV: 92.3 fL (ref 78.0–100.0)
Platelets: 181 10*3/uL (ref 150–400)
RBC: 4.82 MIL/uL (ref 4.22–5.81)
RDW: 15.5 % (ref 11.5–15.5)
WBC: 6.9 10*3/uL (ref 4.0–10.5)

## 2017-07-05 LAB — GASTROINTESTINAL PANEL BY PCR, STOOL (REPLACES STOOL CULTURE)

## 2017-07-05 LAB — HIV ANTIBODY (ROUTINE TESTING W REFLEX): HIV SCREEN 4TH GENERATION: NONREACTIVE

## 2017-07-05 LAB — PROTIME-INR
INR: 1.67
Prothrombin Time: 19.5 seconds — ABNORMAL HIGH (ref 11.4–15.2)

## 2017-07-05 LAB — GLUCOSE, CAPILLARY
GLUCOSE-CAPILLARY: 161 mg/dL — AB (ref 65–99)
Glucose-Capillary: 213 mg/dL — ABNORMAL HIGH (ref 65–99)

## 2017-07-05 LAB — LEGIONELLA PNEUMOPHILA SEROGP 1 UR AG: L. PNEUMOPHILA SEROGP 1 UR AG: NEGATIVE

## 2017-07-05 MED ORDER — INSULIN ASPART 100 UNIT/ML ~~LOC~~ SOLN
0.0000 [IU] | Freq: Three times a day (TID) | SUBCUTANEOUS | Status: DC
  Administered 2017-07-06: 3 [IU] via SUBCUTANEOUS
  Administered 2017-07-06: 2 [IU] via SUBCUTANEOUS
  Administered 2017-07-06: 3 [IU] via SUBCUTANEOUS
  Administered 2017-07-07: 2 [IU] via SUBCUTANEOUS
  Administered 2017-07-07: 3 [IU] via SUBCUTANEOUS
  Administered 2017-07-07: 2 [IU] via SUBCUTANEOUS
  Administered 2017-07-08: 1 [IU] via SUBCUTANEOUS
  Administered 2017-07-08: 3 [IU] via SUBCUTANEOUS
  Administered 2017-07-08: 2 [IU] via SUBCUTANEOUS
  Administered 2017-07-09: 1 [IU] via SUBCUTANEOUS

## 2017-07-05 MED ORDER — WARFARIN SODIUM 3 MG PO TABS
3.0000 mg | ORAL_TABLET | Freq: Once | ORAL | Status: AC
  Administered 2017-07-05: 3 mg via ORAL
  Filled 2017-07-05 (×2): qty 1

## 2017-07-05 MED ORDER — ALBUTEROL SULFATE (2.5 MG/3ML) 0.083% IN NEBU
2.5000 mg | INHALATION_SOLUTION | Freq: Four times a day (QID) | RESPIRATORY_TRACT | Status: DC
  Administered 2017-07-06: 2.5 mg via RESPIRATORY_TRACT
  Filled 2017-07-05: qty 3

## 2017-07-05 MED ORDER — INSULIN ASPART 100 UNIT/ML ~~LOC~~ SOLN
0.0000 [IU] | Freq: Every day | SUBCUTANEOUS | Status: DC
  Administered 2017-07-05 – 2017-07-07 (×3): 2 [IU] via SUBCUTANEOUS
  Administered 2017-07-08: 3 [IU] via SUBCUTANEOUS

## 2017-07-05 NOTE — Progress Notes (Signed)
ANTICOAGULATION CONSULT NOTE - Follow Up  Pharmacy Consult for warfarin Indication: atrial fibrillation  No Known Allergies  Patient Measurements: Height: 5\' 7"  (170.2 cm) Weight: 176 lb 2.4 oz (79.9 kg) IBW/kg (Calculated) : 66.1 Heparin Dosing Weight:   Vital Signs: Temp: 97.5 F (36.4 C) (12/17 1200) Temp Source: Oral (12/17 1200) BP: 103/55 (12/17 0823) Pulse Rate: 51 (12/17 0600)  Labs: Recent Labs    07/04/17 0529 07/04/17 1120 07/04/17 1430 07/04/17 2018 07/05/17 0315  HGB 15.0  --   --   --  14.7  HCT 46.1  --   --   --  44.5  PLT 192  --   --   --  181  LABPROT  --   --  20.1*  --  19.5*  INR  --   --  1.73  --  1.67  CREATININE 1.42*  --   --   --  1.53*  TROPONINI 0.08* 0.08* 0.09* 0.06*  --     Estimated Creatinine Clearance: 31.2 mL/min (A) (by C-G formula based on SCr of 1.53 mg/dL (H)).   Medical History: Past Medical History:  Diagnosis Date  . Advanced age   . Arthritis    knees, shoulders   . ASTEATOTIC ECZEMA   . BENIGN PROSTATIC HYPERTROPHY, WITH OBSTRUCTION   . BRADYCARDIA    1st degree AVB  . COPD (chronic obstructive pulmonary disease) (HCC)    on chronic oxygen therapy qhs>daytime  . DECREASED HEARING   . Hx of cardiovascular stress test    a.  MV 4/09:  possible small area of lateral ischemia at the apex, but Dr. Kirk Ruths reviewed this, and in fact, felt it was most likely normal.  b.  Myoview 3/13 was again low risk with an EF of 62% with small reversible defect in the apex suggestive of very mild apical ischemia.  Marland Kitchen Hx of echocardiogram    a. echo in 2009 (in [place]): normal EF, mild LVH;   b. echo 10/13:  mid Ant HK, mod LVH, EF 55%, trivial AI, mod LAE, mild RVE, mild reduced RVSF, PASP 46  . HYPERTENSION   . INSOMNIA, PERSISTENT   . Large bilateral inguinal hernias - chronic, containing small bowel (right) and sigmoid colon (left) 01/15/2011   s/p B repair  . LEG EDEMA, BILATERAL   . OA (osteoarthritis) of knee    S/p  right TKR March '13 (Dr. Wynelle Link)   . PAROXYSMAL ATRIAL FIBRILLATION    coumadin rx  . RENAL INSUFFICIENCY   . RHINITIS, VASOMOTOR 01/07/2010    Assessment: 48 YOM presents with productive cough and suspected CAP. He is on warfarin prior to admission for h/o atrial fibrillation.  Pharmacy asked to dose warfarin while in hospital.  Home warfarin dosing: 1.5mg  daily except 3mg  on Mon.  Appears has missed last several doses  Today, 07/05/2017  INR subtherapeutic, 3mg  warfarin given 12/16  CBC: Hgb and pltc WNL  No reported bleeding  Goal of Therapy:  INR 2-3   Plan:   Repeat 3mg  warfarin today  Daily INR   Adrian Saran, PharmD, BCPS Pager [phone] 07/05/2017 12:29 PM

## 2017-07-05 NOTE — Progress Notes (Signed)
Inpatient Diabetes Program Recommendations  AACE/ADA: New Consensus Statement on Inpatient Glycemic Control (2015)  Target Ranges:  Prepandial:   less than 140 mg/dL      Peak postprandial:   less than 180 mg/dL (1-2 hours)      Critically ill patients:  140 - 180 mg/dL   Lab Results  Component Value Date   GLUCAP 105 (H) 03/30/2017   HGBA1C 6.4 01/30/2015    Review of Glycemic Control  Diabetes history: None Outpatient Diabetes medications: None Current orders for Inpatient glycemic control: None  HgbA1C is from 01/30/2015. Needs update. On Solumedrol 80 mg Q8H. Needs correction insulin.  Inpatient Diabetes Program Recommendations:     Add Novolog 0-9 units tidwc Need updated HgbA1C.  Will continue to follow.  Thank you. Lorenda Peck, RD, LDN, CDE Inpatient Diabetes Coordinator [phone]

## 2017-07-05 NOTE — Progress Notes (Signed)
PROGRESS NOTE  Ricardo Perkins  XNA:[id number] DOB: Nov 30, 1922 DOA: 07/04/2017 PCP: [name], MD   Brief Narrative: Ricardo Perkins is a 83 y.o. male with a history of COPD, CHF, PAF who presented to the ED for weakness and dyspnea for 2 days with associated cough and low-grade fevers as well as hypoxia measured as the mid-80's despite home 4L of oxygen. Hypoexpanded CXR demonstrated bibasilar opacities and underlying vascular congestion. Ceftriaxone and azithromycin were provided for pneumonia, duonebs provided for concomitant COPD exacerbation, and he was admitted to the SDU for acute hypoxic respiratory failure.   Assessment & Plan: Active Problems:   PAROXYSMAL ATRIAL FIBRILLATION   CKD (chronic kidney disease) stage 3, GFR 30-59 ml/min (HCC)   CAP (community acquired pneumonia)   Tachycardia   Elevated troponin   Diarrhea  Acute on chronic hypoxic respiratory failure: Due to CAP and COPD exacerbation.  - Continue supplemental oxygen to maintain SpO2 90-95%. Needed HFNC and NRB yesterday evening. - Treat conditions below, due to frailty and age, he is at very high risk of decompensation and will be monitored closely in the SDU today.  Bibasilar CAP: RVP and strep pneumoniae Ag negative - Continue typical antibiotics - Monitor blood cultures, sputum culture  COPD exacerbation:  - Continue spiriva - Continue IV steroids today with ongoing wheezing - Albuterol q6h + prn.   Weakness: Due to infection.  - Monitor, will need PT evaluation, hopefully will be ready tomorrow.  PAF with intermittent sinus tachycardia: Overall rate is improved and not w/RVR.  - Continue SDU monitoring, HR improving.  - Continue coumadin  - Continue metoprolol  Troponin elevation: Mild, downward trending, no chest pain. ECG abnormal but not overtly ischemic with PVCs and evidence of LVH. No wall motion abnormalities mentioned on echocardiogram.  - Repeat ECG.  Stage III CKD: SCr 1.42 -  1.53, with baseline of 1.3-1.4.  - Monitor BMP - Renally dose medications - Avoid nephrotoxins.  Chronic diastolic CHF: BNP 542, doubt pulmonary edema contributing to symptoms.  - Continue home lasix.   Diarrhea: Chronic. +CDiff Ag without toxin and negative PCR for toxigenic strains altogether consistent with negative result.  - GI pathogen panel pending, continue empiric contact precautions.  - Monitor, if significant, will consider GI referral vs. consult.   DVT prophylaxis: Coumadin Code Status: DNR Family Communication: Daughter at bedside Disposition Plan: Continue SDU  Consultants:   None  Procedures:   Echocardiogram 07/04/2017:  - Left ventricle: The cavity size was normal. Wall thickness was   increased in a pattern of moderate LVH. Systolic function was   normal. The estimated ejection fraction was in the range of 50%   to 55%. Doppler parameters are consistent with abnormal left   ventricular relaxation (grade 1 diastolic dysfunction). - Aortic valve: Mildly calcified annulus. Trileaflet; mildly   calcified leaflets. There was mild regurgitation. - Mitral valve: Mildly calcified annulus. There was trivial   regurgitation. - Right ventricle: The cavity size was mildly dilated. Systolic   function was mildly reduced. - Tricuspid valve: There was mild regurgitation. - Pulmonary arteries: PA peak pressure: 60 mm Hg (S). - Pericardium, extracardiac: There was no pericardial effusion.  Impressions: - Moderate LVH with LVEF 50-55% and probable grade 1 diastolic   dysfunction. Mildly calcified mitral annulus with trivial mitral   regurgitation. Mildly calcified aortic valve with mild aortic   regurgitation. Mildly reduced right ventricular contraction. Mild   tricuspid regurgitation with estimated PASP 60 mmHg based on  limited views suggesting pulmonary hypertension.  Antimicrobials:  Ceftriaxone 12/16 >>   Azithromycin 12/16 >>    Subjective: This  morning, the patient states he feels better, wants to go home. His daughter, with whom he lives, at the bedside states he minimizes symptoms and she couldn't get him to stand up due to weakness when he's usually ambulatory. She reports his breathing appears stable, worse than baseline. Her other primary concern is that he's had diarrhea and right torso pain intermittently for 6 months which she feels has not been adequately addressed.  Objective: Vitals:   07/05/17 0800 07/05/17 0823 07/05/17 1200 07/05/17 1354  BP:  (!) 103/55    Pulse:      Resp:      Temp: 97.6 F (36.4 C)  (!) 97.5 F (36.4 C)   TempSrc: Oral  Oral   SpO2:    92%  Weight:      Height:        Intake/Output Summary (Last 24 hours) at 07/05/2017 1442 Last data filed at 07/05/2017 0724 Gross per 24 hour  Intake 350 ml  Output 375 ml  Net -25 ml   Filed Weights   07/04/17 0859 07/05/17 0600  Weight: 81.5 kg (179 lb 10.8 oz) 79.9 kg (176 lb 2.4 oz)    Gen: Elderly male in no acute distress Pulm: Mildly labored tachypnea with significant supplemental oxygen. Diffusely rhonchorous and mildly wheezing with decreased sounds at bases. CV: Irreg irreg with rates 70-90's. No murmur, rub, or gallop. No JVD, trace pedal edema. GI: Abdomen soft, non-tender, non-distended, with normoactive bowel sounds. No organomegaly or masses felt. Ext: Warm, no deformities Skin: No rashes, lesions no ulcers Neuro: Alert and oriented. HOH, otherwise no focal neurological deficits. Psych: Judgement and insight appear fair. Mood & affect appropriate.   Data Reviewed: I have personally reviewed following labs and imaging studies  CBC: Recent Labs  Lab 07/04/17 0529 07/05/17 0315  WBC 9.7 6.9  NEUTROABS 7.5  --   HGB 15.0 14.7  HCT 46.1 44.5  MCV 92.8 92.3  PLT 192 762   Basic Metabolic Panel: Recent Labs  Lab 07/04/17 0529 07/05/17 0315  NA 139 139  K 3.8 3.7  CL 103 106  CO2 25 21*  GLUCOSE 138* 205*  BUN 24* 34*    CREATININE 1.42* 1.53*  CALCIUM 9.0 8.8*   GFR: Estimated Creatinine Clearance: 31.2 mL/min (A) (by C-G formula based on SCr of 1.53 mg/dL (H)). Liver Function Tests: Recent Labs  Lab 07/05/17 0315  AST 16  ALT 14*  ALKPHOS 63  BILITOT 1.4*  PROT 6.7  ALBUMIN 3.5   No results for input(s): LIPASE, AMYLASE in the last 168 hours. No results for input(s): AMMONIA in the last 168 hours. Coagulation Profile: Recent Labs  Lab 07/04/17 1430 07/05/17 0315  INR 1.73 1.67   Cardiac Enzymes: Recent Labs  Lab 07/04/17 0529 07/04/17 1120 07/04/17 1430 07/04/17 2018  TROPONINI 0.08* 0.08* 0.09* 0.06*   BNP (last 3 results) No results for input(s): PROBNP in the last 8760 hours. HbA1C: No results for input(s): HGBA1C in the last 72 hours. CBG: No results for input(s): GLUCAP in the last 168 hours. Lipid Profile: No results for input(s): CHOL, HDL, LDLCALC, TRIG, CHOLHDL, LDLDIRECT in the last 72 hours. Thyroid Function Tests: No results for input(s): TSH, T4TOTAL, FREET4, T3FREE, THYROIDAB in the last 72 hours. Anemia Panel: No results for input(s): VITAMINB12, FOLATE, FERRITIN, TIBC, IRON, RETICCTPCT in the last 72 hours. Urine  analysis:    Component Value Date/Time   COLORURINE YELLOW 03/26/2017 0118   APPEARANCEUR CLEAR 03/26/2017 0118   LABSPEC 1.019 03/26/2017 0118   PHURINE 5.0 03/26/2017 0118   GLUCOSEU NEGATIVE 03/26/2017 0118   HGBUR NEGATIVE 03/26/2017 0118   BILIRUBINUR NEGATIVE 03/26/2017 0118   KETONESUR NEGATIVE 03/26/2017 0118   PROTEINUR 30 (A) 03/26/2017 0118   UROBILINOGEN 0.2 02/26/2012 1000   NITRITE NEGATIVE 03/26/2017 0118   LEUKOCYTESUR NEGATIVE 03/26/2017 0118   Recent Results (from the past 240 hour(s))  C difficile quick scan w PCR reflex     Status: Abnormal   Collection Time: 07/04/17  8:34 AM  Result Value Ref Range Status   C Diff antigen POSITIVE (A) NEGATIVE Final   C Diff toxin NEGATIVE NEGATIVE Final   C Diff interpretation  Results are indeterminate. See PCR results.  Final  C. Diff by PCR, Reflexed     Status: None   Collection Time: 07/04/17  8:34 AM  Result Value Ref Range Status   Toxigenic C. Difficile by PCR NEGATIVE NEGATIVE Final    Comment: Patient is colonized with non toxigenic C. difficile. May not need treatment unless significant symptoms are present. Performed at [facility], [place] [address] [place], [place] [address]   MRSA PCR Screening     Status: None   Collection Time: 07/04/17  8:59 AM  Result Value Ref Range Status   MRSA by PCR NEGATIVE NEGATIVE Final    Comment:        The GeneXpert MRSA Assay (FDA approved for NASAL specimens only), is one component of a comprehensive MRSA colonization surveillance program. It is not intended to diagnose MRSA infection nor to guide or monitor treatment for MRSA infections.   Culture, blood (routine x 2) Call MD if unable to obtain prior to antibiotics being given     Status: None (Preliminary result)   Collection Time: 07/04/17 11:20 AM  Result Value Ref Range Status   Specimen Description BLOOD LEFT ANTECUBITAL  Final   Special Requests   Final    BOTTLES DRAWN AEROBIC AND ANAEROBIC Blood Culture adequate volume   Culture   Final    NO GROWTH < 24 HOURS Performed at [facility], [address] [place], [place] [address]    Report Status PENDING  Incomplete  Culture, blood (routine x 2) Call MD if unable to obtain prior to antibiotics being given     Status: None (Preliminary result)   Collection Time: 07/04/17 11:20 AM  Result Value Ref Range Status   Specimen Description BLOOD BLOOD RIGHT HAND  Final   Special Requests IN PEDIATRIC BOTTLE Blood Culture adequate volume  Final   Culture   Final    NO GROWTH < 24 HOURS Performed at [facility], [place] [address], [place], [place] [address]    Report Status PENDING  Incomplete  Respiratory Panel by PCR     Status: None   Collection Time: 07/04/17 11:40 AM  Result  Value Ref Range Status   Adenovirus NOT DETECTED NOT DETECTED Final   Coronavirus 229E NOT DETECTED NOT DETECTED Final   Coronavirus HKU1 NOT DETECTED NOT DETECTED Final   Coronavirus NL63 NOT DETECTED NOT DETECTED Final   Coronavirus OC43 NOT DETECTED NOT DETECTED Final   Metapneumovirus NOT DETECTED NOT DETECTED Final   Rhinovirus / Enterovirus NOT DETECTED NOT DETECTED Final   Influenza A NOT DETECTED NOT DETECTED Final   Influenza B NOT DETECTED NOT  DETECTED Final   Parainfluenza Virus 1 NOT DETECTED NOT DETECTED Final   Parainfluenza Virus 2 NOT DETECTED NOT DETECTED Final   Parainfluenza Virus 3 NOT DETECTED NOT DETECTED Final   Parainfluenza Virus 4 NOT DETECTED NOT DETECTED Final   Respiratory Syncytial Virus NOT DETECTED NOT DETECTED Final   Bordetella pertussis NOT DETECTED NOT DETECTED Final   Chlamydophila pneumoniae NOT DETECTED NOT DETECTED Final   Mycoplasma pneumoniae NOT DETECTED NOT DETECTED Final    Comment: Performed at [facility], [place] [address] [place], [place] [address]  Culture, sputum-assessment     Status: None   Collection Time: 07/04/17  1:36 PM  Result Value Ref Range Status   Specimen Description SPUTUM  Final   Special Requests Normal  Final   Sputum evaluation THIS SPECIMEN IS ACCEPTABLE FOR SPUTUM CULTURE  Final   Report Status 07/04/2017 FINAL  Final  Culture, respiratory (NON-Expectorated)     Status: None (Preliminary result)   Collection Time: 07/04/17  1:36 PM  Result Value Ref Range Status   Specimen Description SPUTUM  Final   Special Requests Normal Reflexed from [id number]  Final   Gram Stain   Final    ABUNDANT WBC PRESENT, PREDOMINANTLY PMN RARE SQUAMOUS EPITHELIAL CELLS PRESENT MODERATE GRAM POSITIVE COCCI IN PAIRS MODERATE GRAM NEGATIVE COCCOBACILLI FEW GRAM NEGATIVE COCCI IN PAIRS FEW GRAM POSITIVE RODS    Culture   Final    TOO YOUNG TO READ Performed at [facility], [place] [address] [place], [place] [address]     Report Status PENDING  Incomplete      Radiology Studies: Dg Chest 2 View  Result Date: 07/04/2017 CLINICAL DATA:  Acute onset of productive cough. Decreased O2 saturation. EXAM: CHEST  2 VIEW COMPARISON:  Chest radiograph performed 03/27/2017 FINDINGS: The lungs are hypoexpanded. Bibasilar airspace opacities raise concern for pneumonia. Underlying vascular crowding and vascular congestion are noted. No pleural effusion or pneumothorax is seen. The cardiomediastinal silhouette is borderline normal in size. No acute osseous abnormalities are identified. IMPRESSION: Lungs hypoexpanded. Bibasilar airspace opacities raise concern for pneumonia. Underlying vascular congestion noted. Electronically Signed   By: Garald Balding M.D.   On: 07/04/2017 05:27    Scheduled Meds: . albuterol  2.5 mg Nebulization Q6H  . finasteride  5 mg Oral Daily  . furosemide  20 mg Oral Daily  . mouth rinse  15 mL Mouth Rinse BID  . methylPREDNISolone (SOLU-MEDROL) injection  80 mg Intravenous Q8H  . metoprolol tartrate  12.5 mg Oral BID  . potassium chloride SA  20 mEq Oral Daily  . sodium chloride flush  3 mL Intravenous Q12H  . tamsulosin  0.4 mg Oral QPC supper  . temazepam  30 mg Oral QHS  . tiotropium  18 mcg Inhalation Daily  . warfarin  3 mg Oral ONCE-1800  . Warfarin - Pharmacist Dosing Inpatient   Does not apply q1800   Continuous Infusions: . sodium chloride 250 mL (07/05/17 0629)  . azithromycin Stopped (07/05/17 0724)  . cefTRIAXone (ROCEPHIN)  IV Stopped (07/05/17 0648)     LOS: 1 day   Time spent: 25 minutes.  Vance Gather, MD [facility] Pager [phone]  If 7PM-7AM, please contact night-coverage [email or web address] Password [email or web address] 07/05/2017, 2:42 PM

## 2017-07-05 NOTE — Care Management Note (Signed)
Case Management Note  Patient Details  Name: Ricardo Perkins MRN: [id number] Date of Birth: 1923-01-28  Subjective/Objective:                  Dyspnea and productive cough requiring non rebreather mask and 02  Action/Plan: Date: July 05, 2017 Velva Harman, BSN, [place] Chart and notes review for patient progress and needs. Will follow for case management and discharge needs. Next review date: [id number]  Expected Discharge Date:                  Expected Discharge Plan:  Home/Self Care  In-House Referral:     Discharge planning Services  CM Consult  Post Acute Care Choice:    Choice offered to:     DME Arranged:    DME Agency:     HH Arranged:    HH Agency:     Status of Service:  In process, will continue to follow  If discussed at Long Length of Stay Meetings, dates discussed:    Additional Comments:  Leeroy Cha, RN 07/05/2017, 7:44 AM

## 2017-07-06 ENCOUNTER — Inpatient Hospital Stay (HOSPITAL_COMMUNITY): Payer: Medicare Other

## 2017-07-06 LAB — COMPREHENSIVE METABOLIC PANEL
ALK PHOS: 57 U/L (ref 38–126)
ALT: 15 U/L — AB (ref 17–63)
AST: 17 U/L (ref 15–41)
Albumin: 3.4 g/dL — ABNORMAL LOW (ref 3.5–5.0)
Anion gap: 10 (ref 5–15)
BILIRUBIN TOTAL: 0.6 mg/dL (ref 0.3–1.2)
BUN: 53 mg/dL — AB (ref 6–20)
CALCIUM: 8.9 mg/dL (ref 8.9–10.3)
CHLORIDE: 106 mmol/L (ref 101–111)
CO2: 23 mmol/L (ref 22–32)
CREATININE: 1.68 mg/dL — AB (ref 0.61–1.24)
GFR, EST AFRICAN AMERICAN: 39 mL/min — AB (ref >60)
GFR, EST NON AFRICAN AMERICAN: 34 mL/min — AB (ref >60)
Glucose, Bld: 211 mg/dL — ABNORMAL HIGH (ref 65–99)
Potassium: 3.8 mmol/L (ref 3.5–5.1)
Sodium: 139 mmol/L (ref 135–145)
Total Protein: 6.5 g/dL (ref 6.5–8.1)

## 2017-07-06 LAB — GLUCOSE, CAPILLARY
GLUCOSE-CAPILLARY: 230 mg/dL — AB (ref 65–99)
GLUCOSE-CAPILLARY: 201 mg/dL — AB (ref 65–99)
Glucose-Capillary: 177 mg/dL — ABNORMAL HIGH (ref 65–99)
Glucose-Capillary: 238 mg/dL — ABNORMAL HIGH (ref 65–99)

## 2017-07-06 LAB — PROTIME-INR
INR: 1.97
Prothrombin Time: 22.2 seconds — ABNORMAL HIGH (ref 11.4–15.2)

## 2017-07-06 MED ORDER — METHYLPREDNISOLONE SODIUM SUCC 125 MG IJ SOLR
80.0000 mg | Freq: Two times a day (BID) | INTRAMUSCULAR | Status: DC
  Administered 2017-07-06 – 2017-07-08 (×4): 80 mg via INTRAVENOUS
  Filled 2017-07-06 (×4): qty 2

## 2017-07-06 MED ORDER — WARFARIN SODIUM 1 MG PO TABS
1.5000 mg | ORAL_TABLET | Freq: Once | ORAL | Status: AC
  Administered 2017-07-06: 18:00:00 1.5 mg via ORAL
  Filled 2017-07-06: qty 1

## 2017-07-06 MED ORDER — ALBUTEROL SULFATE (2.5 MG/3ML) 0.083% IN NEBU
2.5000 mg | INHALATION_SOLUTION | Freq: Three times a day (TID) | RESPIRATORY_TRACT | Status: DC
  Administered 2017-07-06 – 2017-07-09 (×9): 2.5 mg via RESPIRATORY_TRACT
  Filled 2017-07-06 (×9): qty 3

## 2017-07-06 MED ORDER — LOPERAMIDE HCL 2 MG PO CAPS: 2.0000 mg | ORAL_CAPSULE | ORAL | Status: DC | PRN

## 2017-07-06 NOTE — Progress Notes (Signed)
ANTICOAGULATION CONSULT NOTE - Follow Up  Pharmacy Consult for warfarin Indication: atrial fibrillation  No Known Allergies  Patient Measurements: Height: 5\' 7"  (170.2 cm) Weight: 176 lb 2.4 oz (79.9 kg) IBW/kg (Calculated) : 66.1 Heparin Dosing Weight:   Vital Signs: Temp: 97.5 F (36.4 C) (12/18 1200) Temp Source: Oral (12/18 1200) BP: 133/80 (12/18 0306) Pulse Rate: 71 (12/18 0306)  Labs: Recent Labs    07/04/17 0529 07/04/17 1120 07/04/17 1430 07/04/17 2018 07/05/17 0315 07/06/17 0314  HGB 15.0  --   --   --  14.7  --   HCT 46.1  --   --   --  44.5  --   PLT 192  --   --   --  181  --   LABPROT  --   --  20.1*  --  19.5* 22.2*  INR  --   --  1.73  --  1.67 1.97  CREATININE 1.42*  --   --   --  1.53* 1.68*  TROPONINI 0.08* 0.08* 0.09* 0.06*  --   --     Estimated Creatinine Clearance: 28.4 mL/min (A) (by C-G formula based on SCr of 1.68 mg/dL (H)).   Medical History: Past Medical History:  Diagnosis Date  . Advanced age   . Arthritis    knees, shoulders   . ASTEATOTIC ECZEMA   . BENIGN PROSTATIC HYPERTROPHY, WITH OBSTRUCTION   . BRADYCARDIA    1st degree AVB  . COPD (chronic obstructive pulmonary disease) (HCC)    on chronic oxygen therapy qhs>daytime  . DECREASED HEARING   . Hx of cardiovascular stress test    a.  MV 4/09:  possible small area of lateral ischemia at the apex, but Dr. Kirk Ruths reviewed this, and in fact, felt it was most likely normal.  b.  Myoview 3/13 was again low risk with an EF of 62% with small reversible defect in the apex suggestive of very mild apical ischemia.  Marland Kitchen Hx of echocardiogram    a. echo in 2009 (in [place]): normal EF, mild LVH;   b. echo 10/13:  mid Ant HK, mod LVH, EF 55%, trivial AI, mod LAE, mild RVE, mild reduced RVSF, PASP 46  . HYPERTENSION   . INSOMNIA, PERSISTENT   . Large bilateral inguinal hernias - chronic, containing small bowel (right) and sigmoid colon (left) 01/15/2011   s/p B repair  . LEG EDEMA,  BILATERAL   . OA (osteoarthritis) of knee    S/p right TKR March '13 (Dr. Wynelle Link)   . PAROXYSMAL ATRIAL FIBRILLATION    coumadin rx  . RENAL INSUFFICIENCY   . RHINITIS, VASOMOTOR 01/07/2010    Assessment: 64 YOM presents with productive cough and suspected CAP. He is on warfarin prior to admission for h/o atrial fibrillation.  Pharmacy asked to dose warfarin while in hospital.  Home warfarin dosing: 1.5mg  daily except 3mg  on Mon.  Appears has missed last several doses  Today, 07/06/2017  INR just below goal range, inpatient warfarin doses of 3mg  x2  CBC: Hgb and pltc WNL  No reported bleeding  Goal of Therapy:  INR 2-3   Plan:   Anticipate further rise in INR tomorrow - will resume patient's home regimen as above - 1.5mg  today  Daily INR   Adrian Saran, PharmD, BCPS Pager [phone] 07/06/2017 1:00 PM

## 2017-07-06 NOTE — Evaluation (Signed)
Physical Therapy Evaluation Patient Details Name: Ricardo Perkins MRN: [id number] DOB: 1923-05-26 Today's Date: 07/06/2017   History of Present Illness  83 yo male admitted with Pna. hx of CKD, CHF, A fib, COPD-O2 dep, chronic diarrhea, fall, OA.   Clinical Impression  On eval, pt required Min guard-Min assist for mobility. He walked ~150 feet with a RW. O2 sat 92% 10L [place] O2 at rest, 91% 10L [place] O2 during ambulation. Pt tolerated activity well. Will continue to follow and progress activity as able.     Follow Up Recommendations Home health PT;Supervision - Intermittent    Equipment Recommendations  (continuing to assess)    Recommendations for Other Services       Precautions / Restrictions Precautions Precautions: Fall Precaution Comments: high O2 requirement. monitor sats. Restrictions Weight Bearing Restrictions: No      Mobility  Bed Mobility Overal bed mobility: Needs Assistance Bed Mobility: Supine to Sit     Supine to sit: Supervision;HOB elevated     General bed mobility comments: for safety, lines.   Transfers Overall transfer level: Needs assistance Equipment used: Rolling walker (2 wheeled) Transfers: Sit to/from Stand Sit to Stand: Min guard         General transfer comment: close guard for safety. VCs hand placement  Ambulation/Gait Ambulation/Gait assistance: Min assist Ambulation Distance (Feet): 150 Feet Assistive device: Rolling walker (2 wheeled) Gait Pattern/deviations: Step-through pattern     General Gait Details: Intermittent assist to steady. Fair gait speed. O2 sat >90% on 10L [place] during ambulation.   Stairs            Wheelchair Mobility    Modified Rankin (Stroke Patients Only)       Balance                                             Pertinent Vitals/Pain Pain Assessment: No/denies pain    Home Living Family/patient expects to be discharged to:: Private residence Living Arrangements:  Children Available Help at Discharge: Family;Available PRN/intermittently Type of Home: House Home Access: Stairs to enter Entrance Stairs-Rails: Left Entrance Stairs-Number of Steps: 2 Home Layout: Two level;Able to live on main level with bedroom/bathroom Home Equipment: Kasandra Knudsen - single point      Prior Function Level of Independence: Needs assistance   Gait / Transfers Assistance Needed: [facility] for short walks with no problem per pt  ADL's / Homemaking Assistance Needed: daughter is there to assist housework and cooking, independent at self care        Hand Dominance   Dominant Hand: Right    Extremity/Trunk Assessment   Upper Extremity Assessment Upper Extremity Assessment: Generalized weakness    Lower Extremity Assessment Lower Extremity Assessment: Generalized weakness    Cervical / Trunk Assessment Cervical / Trunk Assessment: Normal  Communication   Communication: HOH  Cognition Arousal/Alertness: Awake/alert Behavior During Therapy: WFL for tasks assessed/performed Overall Cognitive Status: Within Functional Limits for tasks assessed                                        General Comments      Exercises     Assessment/Plan    PT Assessment Patient needs continued PT services  PT Problem List Decreased strength;Decreased balance;Decreased activity tolerance;Decreased mobility;Cardiopulmonary status limiting  activity       PT Treatment Interventions DME instruction;Gait training;Functional mobility training;Therapeutic activities;Balance training;Patient/family education;Therapeutic exercise    PT Goals (Current goals can be found in the Care Plan section)  Acute Rehab PT Goals Patient Stated Goal: home PT Goal Formulation: With patient Time For Goal Achievement: 07/20/17 Potential to Achieve Goals: Good    Frequency Min 3X/week   Barriers to discharge        Co-evaluation               AM-PAC PT "6 Clicks" Daily  Activity  Outcome Measure Difficulty turning over in bed (including adjusting bedclothes, sheets and blankets)?: Unable Difficulty moving from lying on back to sitting on the side of the bed? : Unable Difficulty sitting down on and standing up from a chair with arms (e.g., wheelchair, bedside commode, etc,.)?: Unable Help needed moving to and from a bed to chair (including a wheelchair)?: A Little Help needed walking in hospital room?: A Little Help needed climbing 3-5 steps with a railing? : A Little 6 Click Score: 12    End of Session Equipment Utilized During Treatment: Gait belt;Oxygen Activity Tolerance: Patient tolerated treatment well Patient left: in chair;with chair alarm set   PT Visit Diagnosis: Muscle weakness (generalized) (M62.81);Difficulty in walking, not elsewhere classified (R26.2)    Time: 1206-1228 PT Time Calculation (min) (ACUTE ONLY): 22 min   Charges:   PT Evaluation $PT Eval Moderate Complexity: 1 Mod     PT G Codes:         Weston Anna, MPT Pager: [phone]

## 2017-07-06 NOTE — Progress Notes (Signed)
PROGRESS NOTE  Ricardo Perkins  QBH:[id number] DOB: 11-27-1922 DOA: 07/04/2017 PCP: [name], MD   Brief Narrative: Ricardo Perkins is a 83 y.o. male with a history of COPD, CHF, PAF who presented to the ED for weakness and dyspnea for 2 days with associated cough and low-grade fevers as well as hypoxia measured as the mid-80's despite home 4L of oxygen. Hypoexpanded CXR demonstrated bibasilar opacities and underlying vascular congestion. Ceftriaxone and azithromycin were provided for pneumonia, duonebs provided for concomitant COPD exacerbation, and he was admitted to the SDU for acute hypoxic respiratory failure.   Assessment & Plan: Active Problems:   PAROXYSMAL ATRIAL FIBRILLATION   CKD (chronic kidney disease) stage 3, GFR 30-59 ml/min (HCC)   CAP (community acquired pneumonia)   Tachycardia   Elevated troponin   Diarrhea  Acute on chronic hypoxic respiratory failure: Due to CAP and COPD exacerbation.  - Continue supplemental oxygen (currently 12L) to maintain SpO2 90-95%.  - Treat conditions below, due to frailty and age as well as need for HFNC, he is at very high risk of decompensation and will be monitored closely in the SDU  Bibasilar CAP: RVP and strep pneumoniae Ag negative - Continue typical antibiotics - Monitor blood cultures, sputum culture  COPD exacerbation:  - Continue spiriva - Continue IV steroids, if wheezing improved, deescalate to prednisone 12/19 - Albuterol q6h + prn.   Weakness: Due to infection.  - Monitor, will need PT evaluation, hopefully will be ready tomorrow.  PAF with intermittent sinus tachycardia: Overall rate is improved and not w/RVR.  - Continue SDU monitoring, HR improving.  - Continue coumadin  - Continue metoprolol  Hyperglycemia due to steroids, prediabetes: Based on 2016 A1c of 6.4%.  - Updating HbA1c - Continue SSI AC/HS. Would augment to moderate scale if high CBGs continue, though coverage was not given for HS last  night or this morning, so I'm not sure what CBGs would look like if that were given.    Troponin elevation: Mild, downward trending, no chest pain. ECG abnormal but not overtly ischemic with PVCs and evidence of LVH. No wall motion abnormalities mentioned on echocardiogram. Repeat ECG shows no evolving ischemic changes, decreased ectopy.   Stage III CKD: SCr 1.42 - 1.53, with baseline of 1.3-1.4.  - Monitor BMP, hold diuretic today - Renally dose medications - Avoid nephrotoxins.  Chronic diastolic CHF: BNP 097, doubt pulmonary edema contributing to symptoms.  - Hold home lasix for now with rising Cr.   Diarrhea: Chronic. +CDiff Ag without toxin and negative PCR for toxigenic strains altogether consistent with negative result. GI pathogen panel negative.  - DC precautions - Ok to give imodium prn - If continues as outpatient, may benefit from GI referral  Right rib tenderness: With complaints of chronic intermittent pain.  - Check dedicated XR to r/o fracture  DVT prophylaxis: Coumadin Code Status: DNR Family Communication: Daughter at bedside Disposition Plan: Continue SDU given age and need for HFNC.  Consultants:   None  Procedures:   Echocardiogram 07/04/2017:  - Left ventricle: The cavity size was normal. Wall thickness was   increased in a pattern of moderate LVH. Systolic function was   normal. The estimated ejection fraction was in the range of 50%   to 55%. Doppler parameters are consistent with abnormal left   ventricular relaxation (grade 1 diastolic dysfunction). - Aortic valve: Mildly calcified annulus. Trileaflet; mildly   calcified leaflets. There was mild regurgitation. - Mitral valve: Mildly calcified annulus.  There was trivial   regurgitation. - Right ventricle: The cavity size was mildly dilated. Systolic   function was mildly reduced. - Tricuspid valve: There was mild regurgitation. - Pulmonary arteries: PA peak pressure: 60 mm Hg (S). - Pericardium,  extracardiac: There was no pericardial effusion.  Impressions: - Moderate LVH with LVEF 50-55% and probable grade 1 diastolic   dysfunction. Mildly calcified mitral annulus with trivial mitral   regurgitation. Mildly calcified aortic valve with mild aortic   regurgitation. Mildly reduced right ventricular contraction. Mild   tricuspid regurgitation with estimated PASP 60 mmHg based on   limited views suggesting pulmonary hypertension.  Antimicrobials:  Ceftriaxone 12/16 >>   Azithromycin 12/16 >>    Subjective: Feels fine and wants to go home. Desaturating when we decrease oxygen below 12LPM. Wheezing and still weak per daughter, worse than baseline. Denies the right torso pain mentioned yesterday.    Objective: Vitals:   07/05/17 2122 07/06/17 0000 07/06/17 0306 07/06/17 0800  BP:  117/66 133/80   Pulse:  68 71   Resp:  17 20   Temp:  98.4 F (36.9 C) 98.3 F (36.8 C) (!) 97.5 F (36.4 C)  TempSrc:  Oral Oral Oral  SpO2: 92% 97% 95%   Weight:      Height:        Intake/Output Summary (Last 24 hours) at 07/06/2017 0838 Last data filed at 07/06/2017 5956 Gross per 24 hour  Intake 815.17 ml  Output 350 ml  Net 465.17 ml   Filed Weights   07/04/17 0859 07/05/17 0600  Weight: 81.5 kg (179 lb 10.8 oz) 79.9 kg (176 lb 2.4 oz)    Gen: Elderly male in no acute distress Pulm: Nonlabored tachypnea on 12LPM receiving breathing treatment. Expiratory wheezing with clearing of rhonchi since last exam. Still mildly decreased at bases.  CV: Irreg irreg with rate in 70's. No murmur, rub, or gallop. No JVD, no pedal edema. GI: Abdomen soft, non-tender, non-distended, with normoactive bowel sounds. No organomegaly or masses felt. Ext: Warm, no deformities Skin: No rashes, lesions no ulcers Neuro: Alert and oriented. HOH, otherwise no focal neurological deficits. Psych: Judgement and insight appear fair. Mood & affect appropriate.   Data Reviewed: I have personally reviewed  following labs and imaging studies  CBC: Recent Labs  Lab 07/04/17 0529 07/05/17 0315  WBC 9.7 6.9  NEUTROABS 7.5  --   HGB 15.0 14.7  HCT 46.1 44.5  MCV 92.8 92.3  PLT 192 387   Basic Metabolic Panel: Recent Labs  Lab 07/04/17 0529 07/05/17 0315 07/06/17 0314  NA 139 139 139  K 3.8 3.7 3.8  CL 103 106 106  CO2 25 21* 23  GLUCOSE 138* 205* 211*  BUN 24* 34* 53*  CREATININE 1.42* 1.53* 1.68*  CALCIUM 9.0 8.8* 8.9   GFR: Estimated Creatinine Clearance: 28.4 mL/min (A) (by C-G formula based on SCr of 1.68 mg/dL (H)). Liver Function Tests: Recent Labs  Lab 07/05/17 0315 07/06/17 0314  AST 16 17  ALT 14* 15*  ALKPHOS 63 57  BILITOT 1.4* 0.6  PROT 6.7 6.5  ALBUMIN 3.5 3.4*   No results for input(s): LIPASE, AMYLASE in the last 168 hours. No results for input(s): AMMONIA in the last 168 hours. Coagulation Profile: Recent Labs  Lab 07/04/17 1430 07/05/17 0315 07/06/17 0314  INR 1.73 1.67 1.97   Cardiac Enzymes: Recent Labs  Lab 07/04/17 0529 07/04/17 1120 07/04/17 1430 07/04/17 2018  TROPONINI 0.08* 0.08* 0.09* 0.06*  BNP (last 3 results) No results for input(s): PROBNP in the last 8760 hours. HbA1C: No results for input(s): HGBA1C in the last 72 hours. CBG: Recent Labs  Lab 07/05/17 1812 07/05/17 2205 07/06/17 0732  GLUCAP 161* 213* 230*   Lipid Profile: No results for input(s): CHOL, HDL, LDLCALC, TRIG, CHOLHDL, LDLDIRECT in the last 72 hours. Thyroid Function Tests: No results for input(s): TSH, T4TOTAL, FREET4, T3FREE, THYROIDAB in the last 72 hours. Anemia Panel: No results for input(s): VITAMINB12, FOLATE, FERRITIN, TIBC, IRON, RETICCTPCT in the last 72 hours. Urine analysis:    Component Value Date/Time   COLORURINE YELLOW 03/26/2017 0118   APPEARANCEUR CLEAR 03/26/2017 0118   LABSPEC 1.019 03/26/2017 0118   PHURINE 5.0 03/26/2017 0118   GLUCOSEU NEGATIVE 03/26/2017 0118   HGBUR NEGATIVE 03/26/2017 0118   BILIRUBINUR NEGATIVE  03/26/2017 0118   KETONESUR NEGATIVE 03/26/2017 0118   PROTEINUR 30 (A) 03/26/2017 0118   UROBILINOGEN 0.2 02/26/2012 1000   NITRITE NEGATIVE 03/26/2017 0118   LEUKOCYTESUR NEGATIVE 03/26/2017 0118   Recent Results (from the past 240 hour(s))  Gastrointestinal Panel by PCR , Stool     Status: None   Collection Time: 07/04/17  8:34 AM  Result Value Ref Range Status   Campylobacter species NOT DETECTED NOT DETECTED Final   Plesimonas shigelloides NOT DETECTED NOT DETECTED Final   Salmonella species NOT DETECTED NOT DETECTED Final   Yersinia enterocolitica NOT DETECTED NOT DETECTED Final   Vibrio species NOT DETECTED NOT DETECTED Final   Vibrio cholerae NOT DETECTED NOT DETECTED Final   Enteroaggregative E coli (EAEC) NOT DETECTED NOT DETECTED Final   Enteropathogenic E coli (EPEC) NOT DETECTED NOT DETECTED Final   Enterotoxigenic E coli (ETEC) NOT DETECTED NOT DETECTED Final   Shiga like toxin producing E coli (STEC) NOT DETECTED NOT DETECTED Final   Shigella/Enteroinvasive E coli (EIEC) NOT DETECTED NOT DETECTED Final   Cryptosporidium NOT DETECTED NOT DETECTED Final   Cyclospora cayetanensis NOT DETECTED NOT DETECTED Final   Entamoeba histolytica NOT DETECTED NOT DETECTED Final   Giardia lamblia NOT DETECTED NOT DETECTED Final   Adenovirus F40/41 NOT DETECTED NOT DETECTED Final   Astrovirus NOT DETECTED NOT DETECTED Final   Norovirus GI/GII NOT DETECTED NOT DETECTED Final   Rotavirus A NOT DETECTED NOT DETECTED Final   Sapovirus (I, II, IV, and V) NOT DETECTED NOT DETECTED Final  C difficile quick scan w PCR reflex     Status: Abnormal   Collection Time: 07/04/17  8:34 AM  Result Value Ref Range Status   C Diff antigen POSITIVE (A) NEGATIVE Final   C Diff toxin NEGATIVE NEGATIVE Final   C Diff interpretation Results are indeterminate. See PCR results.  Final  C. Diff by PCR, Reflexed     Status: None   Collection Time: 07/04/17  8:34 AM  Result Value Ref Range Status    Toxigenic C. Difficile by PCR NEGATIVE NEGATIVE Final    Comment: Patient is colonized with non toxigenic C. difficile. May not need treatment unless significant symptoms are present. Performed at [facility], [place] [address] [place], [place] [address]   MRSA PCR Screening     Status: None   Collection Time: 07/04/17  8:59 AM  Result Value Ref Range Status   MRSA by PCR NEGATIVE NEGATIVE Final    Comment:        The GeneXpert MRSA Assay (FDA approved for NASAL specimens only), is one component of a comprehensive MRSA colonization surveillance program.  It is not intended to diagnose MRSA infection nor to guide or monitor treatment for MRSA infections.   Culture, blood (routine x 2) Call MD if unable to obtain prior to antibiotics being given     Status: None (Preliminary result)   Collection Time: 07/04/17 11:20 AM  Result Value Ref Range Status   Specimen Description BLOOD LEFT ANTECUBITAL  Final   Special Requests   Final    BOTTLES DRAWN AEROBIC AND ANAEROBIC Blood Culture adequate volume   Culture   Final    NO GROWTH < 24 HOURS Performed at [facility], [address], [place], [place] [address]    Report Status PENDING  Incomplete  Culture, blood (routine x 2) Call MD if unable to obtain prior to antibiotics being given     Status: None (Preliminary result)   Collection Time: 07/04/17 11:20 AM  Result Value Ref Range Status   Specimen Description BLOOD BLOOD RIGHT HAND  Final   Special Requests IN PEDIATRIC BOTTLE Blood Culture adequate volume  Final   Culture   Final    NO GROWTH < 24 HOURS Performed at [facility], [place] [address] [place], [place] [address]    Report Status PENDING  Incomplete  Respiratory Panel by PCR     Status: None   Collection Time: 07/04/17 11:40 AM  Result Value Ref Range Status   Adenovirus NOT DETECTED NOT DETECTED Final   Coronavirus 229E NOT DETECTED NOT DETECTED Final   Coronavirus HKU1 NOT DETECTED NOT DETECTED  Final   Coronavirus NL63 NOT DETECTED NOT DETECTED Final   Coronavirus OC43 NOT DETECTED NOT DETECTED Final   Metapneumovirus NOT DETECTED NOT DETECTED Final   Rhinovirus / Enterovirus NOT DETECTED NOT DETECTED Final   Influenza A NOT DETECTED NOT DETECTED Final   Influenza B NOT DETECTED NOT DETECTED Final   Parainfluenza Virus 1 NOT DETECTED NOT DETECTED Final   Parainfluenza Virus 2 NOT DETECTED NOT DETECTED Final   Parainfluenza Virus 3 NOT DETECTED NOT DETECTED Final   Parainfluenza Virus 4 NOT DETECTED NOT DETECTED Final   Respiratory Syncytial Virus NOT DETECTED NOT DETECTED Final   Bordetella pertussis NOT DETECTED NOT DETECTED Final   Chlamydophila pneumoniae NOT DETECTED NOT DETECTED Final   Mycoplasma pneumoniae NOT DETECTED NOT DETECTED Final    Comment: Performed at [facility], [place] [address], [place], [place] [address]  Culture, sputum-assessment     Status: None   Collection Time: 07/04/17  1:36 PM  Result Value Ref Range Status   Specimen Description SPUTUM  Final   Special Requests Normal  Final   Sputum evaluation THIS SPECIMEN IS ACCEPTABLE FOR SPUTUM CULTURE  Final   Report Status 07/04/2017 FINAL  Final  Culture, respiratory (NON-Expectorated)     Status: None (Preliminary result)   Collection Time: 07/04/17  1:36 PM  Result Value Ref Range Status   Specimen Description SPUTUM  Final   Special Requests Normal Reflexed from [id number]  Final   Gram Stain   Final    ABUNDANT WBC PRESENT, PREDOMINANTLY PMN RARE SQUAMOUS EPITHELIAL CELLS PRESENT MODERATE GRAM POSITIVE COCCI IN PAIRS MODERATE GRAM NEGATIVE COCCOBACILLI FEW GRAM NEGATIVE COCCI IN PAIRS FEW GRAM POSITIVE RODS    Culture   Final    TOO YOUNG TO READ Performed at [facility], [place] [address] [place],  [address]    Report Status PENDING  Incomplete      Radiology Studies: No results found.  Scheduled Meds: . albuterol  2.5 mg Nebulization QID  . finasteride  5 mg Oral Daily   . insulin aspart  0-5 Units Subcutaneous QHS  . insulin aspart  0-9 Units Subcutaneous TID WC  . mouth rinse  15 mL Mouth Rinse BID  . methylPREDNISolone (SOLU-MEDROL) injection  80 mg Intravenous Q8H  . metoprolol tartrate  12.5 mg Oral BID  . potassium chloride SA  20 mEq Oral Daily  . sodium chloride flush  3 mL Intravenous Q12H  . tamsulosin  0.4 mg Oral QPC supper  . temazepam  30 mg Oral QHS  . tiotropium  18 mcg Inhalation Daily  . Warfarin - Pharmacist Dosing Inpatient   Does not apply q1800   Continuous Infusions: . sodium chloride 250 mL (07/05/17 0629)  . azithromycin 500 mg (07/06/17 0622)  . cefTRIAXone (ROCEPHIN)  IV Stopped (07/06/17 0616)     LOS: 2 days   Time spent: 25 minutes.  Vance Gather, MD [facility] Pager [phone]  If 7PM-7AM, please contact night-coverage [email or web address] Password [facility] 07/06/2017, 8:38 AM

## 2017-07-07 LAB — BASIC METABOLIC PANEL
ANION GAP: 9 (ref 5–15)
BUN: 60 mg/dL — AB (ref 6–20)
CALCIUM: 8.8 mg/dL — AB (ref 8.9–10.3)
CO2: 23 mmol/L (ref 22–32)
Chloride: 106 mmol/L (ref 101–111)
Creatinine, Ser: 1.43 mg/dL — ABNORMAL HIGH (ref 0.61–1.24)
GFR calc Af Amer: 47 mL/min — ABNORMAL LOW (ref >60)
GFR, EST NON AFRICAN AMERICAN: 41 mL/min — AB (ref >60)
GLUCOSE: 198 mg/dL — AB (ref 65–99)
Potassium: 4.1 mmol/L (ref 3.5–5.1)
Sodium: 138 mmol/L (ref 135–145)

## 2017-07-07 LAB — GLUCOSE, CAPILLARY
GLUCOSE-CAPILLARY: 145 mg/dL — AB (ref 65–99)
GLUCOSE-CAPILLARY: 170 mg/dL — AB (ref 65–99)
Glucose-Capillary: 175 mg/dL — ABNORMAL HIGH (ref 65–99)
Glucose-Capillary: 225 mg/dL — ABNORMAL HIGH (ref 65–99)

## 2017-07-07 LAB — PROTIME-INR
INR: 2.51
PROTHROMBIN TIME: 26.9 s — AB (ref 11.4–15.2)

## 2017-07-07 LAB — CULTURE, RESPIRATORY W GRAM STAIN
Culture: NORMAL
Special Requests: NORMAL

## 2017-07-07 LAB — CULTURE, RESPIRATORY

## 2017-07-07 LAB — HEMOGLOBIN A1C
Hgb A1c MFr Bld: 6.3 % — ABNORMAL HIGH (ref 4.8–5.6)
MEAN PLASMA GLUCOSE: 134 mg/dL

## 2017-07-07 MED ORDER — WARFARIN SODIUM 1 MG PO TABS
1.0000 mg | ORAL_TABLET | Freq: Once | ORAL | Status: AC
  Administered 2017-07-07: 1 mg via ORAL
  Filled 2017-07-07: qty 1

## 2017-07-07 NOTE — Progress Notes (Signed)
Physical Therapy Treatment Patient Details Name: Ricardo Perkins MRN: [id number] DOB: 04/04/1923 Today's Date: 07/07/2017    History of Present Illness 83 yo male admitted with Pna. hx of CKD, CHF, A fib, COPD-O2 dep, chronic diarrhea, fall, OA.     PT Comments    Pt required Min assist for mobility on today. O2 sat 89-90% on 10L O2, Dyspnea 2/4 during ambulation. Will continue to follow and progress activity as tolerated.    Follow Up Recommendations  Home health PT;Supervision/Assistance - 24 hour     Equipment Recommendations  Rolling walker with 5" wheels    Recommendations for Other Services       Precautions / Restrictions Precautions Precautions: Fall Precaution Comments: high O2 requirement. monitor sats. Restrictions Weight Bearing Restrictions: No    Mobility  Bed Mobility Overal bed mobility: Needs Assistance Bed Mobility: Supine to Sit     Supine to sit: Supervision     General bed mobility comments: for safety, lines.   Transfers Overall transfer level: Needs assistance Equipment used: Rolling walker (2 wheeled) Transfers: Sit to/from Stand Sit to Stand: Min assist         General transfer comment: Assist to rise, steady. VCs hand placement  Ambulation/Gait Ambulation/Gait assistance: Min assist Ambulation Distance (Feet): 135 Feet Assistive device: Rolling walker (2 wheeled) Gait Pattern/deviations: Step-through pattern     General Gait Details: Intermittent assist to steady. Fair gait speed. O2 sat 89-90% on 10L [place] during ambulation. Dyspnea 2/4   Stairs            Wheelchair Mobility    Modified Rankin (Stroke Patients Only)       Balance Overall balance assessment: Needs assistance           Standing balance-Leahy Scale: Fair                              Cognition Arousal/Alertness: Awake/alert Behavior During Therapy: WFL for tasks assessed/performed Overall Cognitive Status: Within Functional Limits  for tasks assessed                                        Exercises      General Comments        Pertinent Vitals/Pain Pain Assessment: No/denies pain    Home Living                      Prior Function            PT Goals (current goals can now be found in the care plan section) Progress towards PT goals: Progressing toward goals    Frequency    Min 3X/week      PT Plan Current plan remains appropriate    Co-evaluation              AM-PAC PT "6 Clicks" Daily Activity  Outcome Measure  Difficulty turning over in bed (including adjusting bedclothes, sheets and blankets)?: A Little Difficulty moving from lying on back to sitting on the side of the bed? : A Little Difficulty sitting down on and standing up from a chair with arms (e.g., wheelchair, bedside commode, etc,.)?: Unable Help needed moving to and from a bed to chair (including a wheelchair)?: A Little Help needed walking in hospital room?: A Little Help needed climbing 3-5 steps with a railing? :  A Little 6 Click Score: 16    End of Session Equipment Utilized During Treatment: Gait belt;Oxygen Activity Tolerance: Patient tolerated treatment well Patient left: in chair;with call bell/phone within reach;with chair alarm set   PT Visit Diagnosis: Muscle weakness (generalized) (M62.81);Difficulty in walking, not elsewhere classified (R26.2)     Time: 5462-7035 PT Time Calculation (min) (ACUTE ONLY): 20 min  Charges:  $Gait Training: 8-22 mins                    G Codes:          Weston Anna, MPT Pager: [phone]

## 2017-07-07 NOTE — Progress Notes (Signed)
PROGRESS NOTE  Ricardo Perkins  GYF:[id number] DOB: 07-01-1923 DOA: 07/04/2017 PCP: [name], MD   Brief Narrative: Ricardo Perkins is a 83 y.o. male with a history of COPD, CHF, PAF who presented to the ED for weakness and dyspnea for 2 days with associated cough and low-grade fevers as well as hypoxia measured as the mid-80's despite home 4L of oxygen. Hypoexpanded CXR demonstrated bibasilar opacities and underlying vascular congestion. Ceftriaxone and azithromycin were provided for pneumonia, duonebs provided for concomitant COPD exacerbation, and he was admitted to the SDU for acute hypoxic respiratory failure.   Assessment & Plan: Active Problems:   PAROXYSMAL ATRIAL FIBRILLATION   CKD (chronic kidney disease) stage 3, GFR 30-59 ml/min (HCC)   CAP (community acquired pneumonia)   Tachycardia   Elevated troponin   Diarrhea  Acute on chronic hypoxic respiratory failure: Due to CAP and COPD exacerbation.  - Continue supplemental oxygen (currently 10L), hoping to wean today. If unable to improve, would seek pulmonology input. - Treat conditions below, due to frailty and age as well as need for HFNC, he is at very high risk of decompensation and will be monitored closely in the SDU  Bibasilar CAP: RVP and strep pneumoniae Ag negative - Continue typical antibiotics. Afebrile, no leukocytosis. - Monitor blood cultures, sputum culture  COPD exacerbation:  - Continue spiriva - Continue IV steroids, if wheezing improved, deescalate to prednisone 12/20 - Albuterol q6h + prn.   Weakness: Due to acute illness and significant chronic comorbidities.  - PT evaluating pt, recommending home health.  PAF with intermittent sinus tachycardia: Overall rate is improved and not w/RVR.  - Continue SDU monitoring, HR improving.  - Continue coumadin  - Continue metoprolol  Hyperglycemia due to steroids, prediabetes: HbA1c 6.3%. - Continue SSI AC/HS.   Troponin elevation: Mild, downward  trending, no chest pain. ECG abnormal but not overtly ischemic with PVCs and evidence of LVH. No wall motion abnormalities mentioned on echocardiogram. Repeat ECG shows no evolving ischemic changes, decreased ectopy.   Stage III CKD: SCr 1.42 - 1.53, with baseline of 1.3-1.4.  - Monitor BMP, held diuretic with creatinine rise.  - Renally dose medications - Avoid nephrotoxins.  Chronic diastolic CHF: BNP 916, doubt pulmonary edema contributing to symptoms.  - Hold home lasix for now with rising Cr. No crackles today.  Diarrhea: Chronic. +CDiff Ag without toxin and negative PCR for toxigenic strains altogether consistent with negative result. GI pathogen panel negative.  - DC precautions - Ok to give imodium prn, though this seems to have resolved during hospitalization - If continues as outpatient, may benefit from GI referral  Right rib tenderness: Negative XR.   DVT prophylaxis: Coumadin Code Status: DNR Family Communication: None at bedside this AM. Daughter has been updated daily previously. Disposition Plan: Continue SDU given age and need for HFNC.  Consultants:   None  Procedures:   Echocardiogram 07/04/2017:  - Left ventricle: The cavity size was normal. Wall thickness was   increased in a pattern of moderate LVH. Systolic function was   normal. The estimated ejection fraction was in the range of 50%   to 55%. Doppler parameters are consistent with abnormal left   ventricular relaxation (grade 1 diastolic dysfunction). - Aortic valve: Mildly calcified annulus. Trileaflet; mildly   calcified leaflets. There was mild regurgitation. - Mitral valve: Mildly calcified annulus. There was trivial   regurgitation. - Right ventricle: The cavity size was mildly dilated. Systolic   function was mildly reduced. -  Tricuspid valve: There was mild regurgitation. - Pulmonary arteries: PA peak pressure: 60 mm Hg (S). - Pericardium, extracardiac: There was no pericardial  effusion.  Impressions: - Moderate LVH with LVEF 50-55% and probable grade 1 diastolic   dysfunction. Mildly calcified mitral annulus with trivial mitral   regurgitation. Mildly calcified aortic valve with mild aortic   regurgitation. Mildly reduced right ventricular contraction. Mild   tricuspid regurgitation with estimated PASP 60 mmHg based on   limited views suggesting pulmonary hypertension.  Antimicrobials:  Ceftriaxone 12/16 >>   Azithromycin 12/16 >>    Subjective: Needing 10LPM flow. While telling me he feels 100%, wants to go home, he is going to 89% on 10L at rest. Denies diarrhea. No other complaints.   Objective: Vitals:   07/07/17 0812 07/07/17 0943 07/07/17 1000 07/07/17 1150  BP:  125/61 134/66   Pulse:  80 72   Resp:   (!) 23   Temp: (!) 97.5 F (36.4 C)   97.7 F (36.5 C)  TempSrc: Oral   Oral  SpO2: 95%  93%   Weight:      Height:        Intake/Output Summary (Last 24 hours) at 07/07/2017 1422 Last data filed at 07/07/2017 0920 Gross per 24 hour  Intake 1120 ml  Output 750 ml  Net 370 ml   Filed Weights   07/04/17 0859 07/05/17 0600  Weight: 81.5 kg (179 lb 10.8 oz) 79.9 kg (176 lb 2.4 oz)    Gen: Elderly male in no acute distress Pulm: Nonlabored tachypnea on 10LPM. Bibasilar rhonchi, decreased with improved wheezes.  CV: Irreg irreg with rate in 70's. No murmur, rub, or gallop. No JVD, no pedal edema. GI: Abdomen soft, non-tender, non-distended, with normoactive bowel sounds. No organomegaly or masses felt. Ext: Warm, no deformities Skin: No rashes, lesions no ulcers Neuro: Alert and oriented. HOH, otherwise no focal neurological deficits. Psych: Judgement and insight appear fair. Mood & affect appropriate.   Data Reviewed: I have personally reviewed following labs and imaging studies  CBC: Recent Labs  Lab 07/04/17 0529 07/05/17 0315  WBC 9.7 6.9  NEUTROABS 7.5  --   HGB 15.0 14.7  HCT 46.1 44.5  MCV 92.8 92.3  PLT 192 242    Basic Metabolic Panel: Recent Labs  Lab 07/04/17 0529 07/05/17 0315 07/06/17 0314 07/07/17 0307  NA 139 139 139 138  K 3.8 3.7 3.8 4.1  CL 103 106 106 106  CO2 25 21* 23 23  GLUCOSE 138* 205* 211* 198*  BUN 24* 34* 53* 60*  CREATININE 1.42* 1.53* 1.68* 1.43*  CALCIUM 9.0 8.8* 8.9 8.8*   GFR: Estimated Creatinine Clearance: 33.4 mL/min (A) (by C-G formula based on SCr of 1.43 mg/dL (H)). Liver Function Tests: Recent Labs  Lab 07/05/17 0315 07/06/17 0314  AST 16 17  ALT 14* 15*  ALKPHOS 63 57  BILITOT 1.4* 0.6  PROT 6.7 6.5  ALBUMIN 3.5 3.4*   No results for input(s): LIPASE, AMYLASE in the last 168 hours. No results for input(s): AMMONIA in the last 168 hours. Coagulation Profile: Recent Labs  Lab 07/04/17 1430 07/05/17 0315 07/06/17 0314 07/07/17 0307  INR 1.73 1.67 1.97 2.51   Cardiac Enzymes: Recent Labs  Lab 07/04/17 0529 07/04/17 1120 07/04/17 1430 07/04/17 2018  TROPONINI 0.08* 0.08* 0.09* 0.06*   BNP (last 3 results) No results for input(s): PROBNP in the last 8760 hours. HbA1C: Recent Labs    07/06/17 0314  HGBA1C 6.3*  CBG: Recent Labs  Lab 07/06/17 1111 07/06/17 1711 07/06/17 2143 07/07/17 0731 07/07/17 1201  GLUCAP 177* 238* 201* 175* 225*   Lipid Profile: No results for input(s): CHOL, HDL, LDLCALC, TRIG, CHOLHDL, LDLDIRECT in the last 72 hours. Thyroid Function Tests: No results for input(s): TSH, T4TOTAL, FREET4, T3FREE, THYROIDAB in the last 72 hours. Anemia Panel: No results for input(s): VITAMINB12, FOLATE, FERRITIN, TIBC, IRON, RETICCTPCT in the last 72 hours. Urine analysis:    Component Value Date/Time   COLORURINE YELLOW 03/26/2017 0118   APPEARANCEUR CLEAR 03/26/2017 0118   LABSPEC 1.019 03/26/2017 0118   PHURINE 5.0 03/26/2017 0118   GLUCOSEU NEGATIVE 03/26/2017 0118   HGBUR NEGATIVE 03/26/2017 0118   BILIRUBINUR NEGATIVE 03/26/2017 0118   KETONESUR NEGATIVE 03/26/2017 0118   PROTEINUR 30 (A) 03/26/2017  0118   UROBILINOGEN 0.2 02/26/2012 1000   NITRITE NEGATIVE 03/26/2017 0118   LEUKOCYTESUR NEGATIVE 03/26/2017 0118   Recent Results (from the past 240 hour(s))  Gastrointestinal Panel by PCR , Stool     Status: None   Collection Time: 07/04/17  8:34 AM  Result Value Ref Range Status   Campylobacter species NOT DETECTED NOT DETECTED Final   Plesimonas shigelloides NOT DETECTED NOT DETECTED Final   Salmonella species NOT DETECTED NOT DETECTED Final   Yersinia enterocolitica NOT DETECTED NOT DETECTED Final   Vibrio species NOT DETECTED NOT DETECTED Final   Vibrio cholerae NOT DETECTED NOT DETECTED Final   Enteroaggregative E coli (EAEC) NOT DETECTED NOT DETECTED Final   Enteropathogenic E coli (EPEC) NOT DETECTED NOT DETECTED Final   Enterotoxigenic E coli (ETEC) NOT DETECTED NOT DETECTED Final   Shiga like toxin producing E coli (STEC) NOT DETECTED NOT DETECTED Final   Shigella/Enteroinvasive E coli (EIEC) NOT DETECTED NOT DETECTED Final   Cryptosporidium NOT DETECTED NOT DETECTED Final   Cyclospora cayetanensis NOT DETECTED NOT DETECTED Final   Entamoeba histolytica NOT DETECTED NOT DETECTED Final   Giardia lamblia NOT DETECTED NOT DETECTED Final   Adenovirus F40/41 NOT DETECTED NOT DETECTED Final   Astrovirus NOT DETECTED NOT DETECTED Final   Norovirus GI/GII NOT DETECTED NOT DETECTED Final   Rotavirus A NOT DETECTED NOT DETECTED Final   Sapovirus (I, II, IV, and V) NOT DETECTED NOT DETECTED Final  C difficile quick scan w PCR reflex     Status: Abnormal   Collection Time: 07/04/17  8:34 AM  Result Value Ref Range Status   C Diff antigen POSITIVE (A) NEGATIVE Final   C Diff toxin NEGATIVE NEGATIVE Final   C Diff interpretation Results are indeterminate. See PCR results.  Final  C. Diff by PCR, Reflexed     Status: None   Collection Time: 07/04/17  8:34 AM  Result Value Ref Range Status   Toxigenic C. Difficile by PCR NEGATIVE NEGATIVE Final    Comment: Patient is colonized with  non toxigenic C. difficile. May not need treatment unless significant symptoms are present. Performed at [facility], [place] [address], [place], [place] [address]   MRSA PCR Screening     Status: None   Collection Time: 07/04/17  8:59 AM  Result Value Ref Range Status   MRSA by PCR NEGATIVE NEGATIVE Final    Comment:        The GeneXpert MRSA Assay (FDA approved for NASAL specimens only), is one component of a comprehensive MRSA colonization surveillance program. It is not intended to diagnose MRSA infection nor to guide or monitor treatment for MRSA infections.  Culture, blood (routine x 2) Call MD if unable to obtain prior to antibiotics being given     Status: None (Preliminary result)   Collection Time: 07/04/17 11:20 AM  Result Value Ref Range Status   Specimen Description BLOOD LEFT ANTECUBITAL  Final   Special Requests   Final    BOTTLES DRAWN AEROBIC AND ANAEROBIC Blood Culture adequate volume   Culture   Final    NO GROWTH 2 DAYS Performed at [facility], [address], [place], [place] [address]    Report Status PENDING  Incomplete  Culture, blood (routine x 2) Call MD if unable to obtain prior to antibiotics being given     Status: None (Preliminary result)   Collection Time: 07/04/17 11:20 AM  Result Value Ref Range Status   Specimen Description BLOOD BLOOD RIGHT HAND  Final   Special Requests IN PEDIATRIC BOTTLE Blood Culture adequate volume  Final   Culture   Final    NO GROWTH 2 DAYS Performed at [facility], [place] [address], [place], [place] [address]    Report Status PENDING  Incomplete  Respiratory Panel by PCR     Status: None   Collection Time: 07/04/17 11:40 AM  Result Value Ref Range Status   Adenovirus NOT DETECTED NOT DETECTED Final   Coronavirus 229E NOT DETECTED NOT DETECTED Final   Coronavirus HKU1 NOT DETECTED NOT DETECTED Final   Coronavirus NL63 NOT DETECTED NOT DETECTED Final   Coronavirus OC43 NOT DETECTED NOT DETECTED  Final   Metapneumovirus NOT DETECTED NOT DETECTED Final   Rhinovirus / Enterovirus NOT DETECTED NOT DETECTED Final   Influenza A NOT DETECTED NOT DETECTED Final   Influenza B NOT DETECTED NOT DETECTED Final   Parainfluenza Virus 1 NOT DETECTED NOT DETECTED Final   Parainfluenza Virus 2 NOT DETECTED NOT DETECTED Final   Parainfluenza Virus 3 NOT DETECTED NOT DETECTED Final   Parainfluenza Virus 4 NOT DETECTED NOT DETECTED Final   Respiratory Syncytial Virus NOT DETECTED NOT DETECTED Final   Bordetella pertussis NOT DETECTED NOT DETECTED Final   Chlamydophila pneumoniae NOT DETECTED NOT DETECTED Final   Mycoplasma pneumoniae NOT DETECTED NOT DETECTED Final    Comment: Performed at [facility], [place] [address] [place], [place] [address]  Culture, sputum-assessment     Status: None   Collection Time: 07/04/17  1:36 PM  Result Value Ref Range Status   Specimen Description SPUTUM  Final   Special Requests Normal  Final   Sputum evaluation THIS SPECIMEN IS ACCEPTABLE FOR SPUTUM CULTURE  Final   Report Status 07/04/2017 FINAL  Final  Culture, respiratory (NON-Expectorated)     Status: None   Collection Time: 07/04/17  1:36 PM  Result Value Ref Range Status   Specimen Description SPUTUM  Final   Special Requests Normal Reflexed from [id number]  Final   Gram Stain   Final    ABUNDANT WBC PRESENT, PREDOMINANTLY PMN RARE SQUAMOUS EPITHELIAL CELLS PRESENT MODERATE GRAM POSITIVE COCCI IN PAIRS MODERATE GRAM NEGATIVE COCCOBACILLI FEW GRAM NEGATIVE COCCI IN PAIRS FEW GRAM POSITIVE RODS    Culture   Final    Consistent with normal respiratory flora. Performed at [facility], [place] [address] [place],  [address]    Report Status 07/07/2017 FINAL  Final      Radiology Studies: Dg Ribs Unilateral Right  Result Date: 07/06/2017 CLINICAL DATA:  Rib tenderness on the right no injury EXAM: RIGHT RIBS - 2 VIEW  COMPARISON:  07/04/2017 FINDINGS: No fracture or other bone lesions are  seen involving the ribs. IMPRESSION: Negative. Electronically Signed   By: Franchot Gallo M.D.   On: 07/06/2017 11:09    Scheduled Meds: . albuterol  2.5 mg Nebulization TID  . finasteride  5 mg Oral Daily  . insulin aspart  0-5 Units Subcutaneous QHS  . insulin aspart  0-9 Units Subcutaneous TID WC  . mouth rinse  15 mL Mouth Rinse BID  . methylPREDNISolone (SOLU-MEDROL) injection  80 mg Intravenous Q12H  . metoprolol tartrate  12.5 mg Oral BID  . potassium chloride SA  20 mEq Oral Daily  . sodium chloride flush  3 mL Intravenous Q12H  . tamsulosin  0.4 mg Oral QPC supper  . temazepam  30 mg Oral QHS  . tiotropium  18 mcg Inhalation Daily  . Warfarin - Pharmacist Dosing Inpatient   Does not apply q1800   Continuous Infusions: . sodium chloride Stopped (07/06/17 1200)  . azithromycin Stopped (07/07/17 0920)  . cefTRIAXone (ROCEPHIN)  IV Stopped (07/07/17 0734)     LOS: 3 days   Time spent: 25 minutes.  Vance Gather, MD [facility] Pager [phone]  If 7PM-7AM, please contact night-coverage [email or web address] Password [email or web address] 07/07/2017, 2:22 PM

## 2017-07-07 NOTE — Progress Notes (Addendum)
ANTICOAGULATION CONSULT NOTE - Follow Up  Pharmacy Consult for warfarin Indication: atrial fibrillation  No Known Allergies  Patient Measurements: Height: 5\' 7"  (170.2 cm) Weight: 176 lb 2.4 oz (79.9 kg) IBW/kg (Calculated) : 66.1  Vital Signs: Temp: 97.7 F (36.5 C) (12/19 1150) Temp Source: Oral (12/19 1150) BP: 134/66 (12/19 1000) Pulse Rate: 72 (12/19 1000)  Labs: Recent Labs    07/04/17 1430 07/04/17 2018 07/05/17 0315 07/06/17 0314 07/07/17 0307  HGB  --   --  14.7  --   --   HCT  --   --  44.5  --   --   PLT  --   --  181  --   --   LABPROT 20.1*  --  19.5* 22.2* 26.9*  INR 1.73  --  1.67 1.97 2.51  CREATININE  --   --  1.53* 1.68* 1.43*  TROPONINI 0.09* 0.06*  --   --   --     Estimated Creatinine Clearance: 33.4 mL/min (A) (by C-G formula based on SCr of 1.43 mg/dL (H)).   Assessment: 5 YOM presents with productive cough and suspected CAP. He is on warfarin prior to admission for h/o atrial fibrillation.  Pharmacy asked to dose warfarin while in hospital.  Home warfarin dosing: 1.5 mg daily except 3mg  on Mon.  Appears has missed last several doses  Today, 07/07/2017  INR now therapeutic, significant increase from yesterday (boosted dose given x 1)  CBC: Hgb and pltc WNL, stable  No reported bleeding  Drug interactions: none major; on broad-spectrum abx  Eating 100% of meals  BNP elevated  Goal of Therapy:  INR 2-3   Plan:   Warfarin 1 mg x 1 tonight; given significant rise in INR, abx, and acute illness, will dose conservatively until INR trend becomes more evident.   Daily INR  CBC q72 hr while on warfarin  Reuel Boom, PharmD, BCPS Pager: [phone] 07/07/2017, 2:23 PM

## 2017-07-08 DIAGNOSIS — I4891 Unspecified atrial fibrillation: Secondary | ICD-10-CM

## 2017-07-08 DIAGNOSIS — R197 Diarrhea, unspecified: Secondary | ICD-10-CM

## 2017-07-08 LAB — BASIC METABOLIC PANEL
Anion gap: 6 (ref 5–15)
BUN: 56 mg/dL — AB (ref 6–20)
CHLORIDE: 108 mmol/L (ref 101–111)
CO2: 24 mmol/L (ref 22–32)
Calcium: 8.6 mg/dL — ABNORMAL LOW (ref 8.9–10.3)
Creatinine, Ser: 1.47 mg/dL — ABNORMAL HIGH (ref 0.61–1.24)
GFR calc Af Amer: 46 mL/min — ABNORMAL LOW (ref >60)
GFR calc non Af Amer: 40 mL/min — ABNORMAL LOW (ref >60)
GLUCOSE: 222 mg/dL — AB (ref 65–99)
POTASSIUM: 4.5 mmol/L (ref 3.5–5.1)
Sodium: 138 mmol/L (ref 135–145)

## 2017-07-08 LAB — GLUCOSE, CAPILLARY
GLUCOSE-CAPILLARY: 135 mg/dL — AB (ref 65–99)
GLUCOSE-CAPILLARY: 252 mg/dL — AB (ref 65–99)
Glucose-Capillary: 203 mg/dL — ABNORMAL HIGH (ref 65–99)
Glucose-Capillary: 233 mg/dL — ABNORMAL HIGH (ref 65–99)
Glucose-Capillary: 176 mg/dL — ABNORMAL HIGH (ref 65–99)
Glucose-Capillary: 176 mg/dL — ABNORMAL HIGH (ref 65–99)

## 2017-07-08 LAB — PROTIME-INR
INR: 3.28
Prothrombin Time: 33.1 seconds — ABNORMAL HIGH (ref 11.4–15.2)

## 2017-07-08 LAB — CBC
HEMATOCRIT: 39.1 % (ref 39.0–52.0)
Hemoglobin: 12.6 g/dL — ABNORMAL LOW (ref 13.0–17.0)
MCH: 29.5 pg (ref 26.0–34.0)
MCHC: 32.2 g/dL (ref 30.0–36.0)
MCV: 91.6 fL (ref 78.0–100.0)
Platelets: 193 10*3/uL (ref 150–400)
RBC: 4.27 MIL/uL (ref 4.22–5.81)
RDW: 15.3 % (ref 11.5–15.5)
WBC: 8.4 10*3/uL (ref 4.0–10.5)

## 2017-07-08 MED ORDER — PREDNISONE 20 MG PO TABS
40.0000 mg | ORAL_TABLET | Freq: Every day | ORAL | Status: DC
  Administered 2017-07-09: 40 mg via ORAL
  Filled 2017-07-08: qty 2

## 2017-07-08 MED ORDER — AZITHROMYCIN 250 MG PO TABS
500.0000 mg | ORAL_TABLET | Freq: Every day | ORAL | Status: DC
  Administered 2017-07-09: 500 mg via ORAL
  Filled 2017-07-08: qty 2

## 2017-07-08 NOTE — Progress Notes (Signed)
Pt settled in bed.  VSS, heart monitor placed.  Call bell within reach.  All needs met at this time.

## 2017-07-08 NOTE — Progress Notes (Signed)
[facility]  PROGRESS NOTE  Ricardo Perkins:[id number] DOB: 08/10/1922 DOA: 07/04/2017 PCP: [name], MD   Brief HPI:     83 y.o. male with a history of COPD, CHF, PAF who presented to the ED for weakness and dyspnea for 2 days with associated cough and low-grade fevers as well as hypoxia measured as the mid-80's despite home 4L of oxygen. Hypoexpanded CXR demonstrated bibasilar opacities and underlying vascular congestion. Ceftriaxone and azithromycin were provided for pneumonia, duonebs provided for concomitant COPD exacerbation, and he was admitted to the SDU for acute hypoxic respiratory failure     Subjective   Patient seen and examined, denies chest pain or shortness of breath.   Assessment/Plan:     1. Acute on chronic hypoxic respiratory failure-due to community-acquired pneumonia and COPD exacerbation. Continue supplemental oxygen. Oxygen has been weaned off to 6 L per minute high flow nasal cannula. 2. Bibasilar community acquired pneumonia- strep pneumo antigen is negative. Continue ceftriaxone and Zithromax. 3. COPD exacerbation- continue Spiriva, will change IV steroids to by mouth prednisone. Continue albuterol every 6 hours when necessary. 4. Generalized weakness- due to acute illness with significant chronic comorbidities. PT recommending home health physical therapy. 5. Paroxysmal atrial fibrillation with intermittent sinus tachycardia- heart rate is stable. Continue Coumadin, metoprolol. 6. Hyperglycemia due to steroids.- Hemoglobin A1c 6.3%, continue sliding scale insulin with NovoLog. 7. Troponin elevation-patient had mild troponin elevation. No chest pain. No wall motion abnormalities seen on echocardiogram. Troponin is trending downwards. 8. Chronic diastolic CHF- euvolemic. Lasix on hold. 9. Diarrhea- chronic, C. difficile antigen is positive with negative toxin. Consistent with negative results. GI pathogen panel is also negative. Continue  when necessary Imodium    DVT prophylaxis: Coumadin  Code Status: DO NOT RESUSCITATE   Family Communication: No family present at bedside.  Disposition Plan: Likely home in next 2-3 days   Consultants:  None  Procedures: Echo- Moderate LVH with LVEF 50-55% and probable grade 1 diastolic dysfunction. Mildly calcified mitral annulus with trivial mitral regurgitation. Mildly calcified aortic valve with mild aortic regurgitation. Mildly reduced right ventricular contraction. Mild tricuspid regurgitation with estimated PASP 60 mmHg based on limited views suggesting pulmonary hypertension.      Continuous infusions . sodium chloride Stopped (07/06/17 1200)  . azithromycin Stopped (07/08/17 0900)  . cefTRIAXone (ROCEPHIN)  IV Stopped (07/08/17 0900)      Antibiotics:   Anti-infectives (From admission, onward)   Start     Dose/Rate Route Frequency Ordered Stop   07/05/17 0600  cefTRIAXone (ROCEPHIN) 1 g in dextrose 5 % 50 mL IVPB     1 g 100 mL/hr over 30 Minutes Intravenous Every 24 hours 07/04/17 1004 07/11/17 0559   07/05/17 0600  azithromycin (ZITHROMAX) 500 mg in dextrose 5 % 250 mL IVPB     500 mg 250 mL/hr over 60 Minutes Intravenous Every 24 hours 07/04/17 1004 07/11/17 0559   07/04/17 0700  cefTRIAXone (ROCEPHIN) 1 g in dextrose 5 % 50 mL IVPB     1 g 100 mL/hr over 30 Minutes Intravenous  Once 07/04/17 0650 07/04/17 0729   07/04/17 0700  azithromycin (ZITHROMAX) 500 mg in dextrose 5 % 250 mL IVPB     500 mg 250 mL/hr over 60 Minutes Intravenous  Once 07/04/17 0650 07/04/17 0814       Objective   Vitals:   07/08/17 1000 07/08/17 1100 07/08/17 1115 07/08/17 1200  BP: 135/66   (!) 141/70  Pulse: 63 (!) 59  (!)  58  Resp: (!) 22 (!) 22  18  Temp:   97.8 F (36.6 C)   TempSrc:   Oral   SpO2: 92% 93%  (!) 89%  Weight:      Height:        Intake/Output Summary (Last 24 hours) at 07/08/2017 1400 Last data filed at 07/08/2017 1200 Gross  per 24 hour  Intake 300 ml  Output 800 ml  Net -500 ml   Filed Weights   07/04/17 0859 07/05/17 0600 07/08/17 0359  Weight: 81.5 kg (179 lb 10.8 oz) 79.9 kg (176 lb 2.4 oz) 83.2 kg (183 lb 6.8 oz)     Physical Examination:   Physical Exam: Eyes: No icterus, extraocular muscles intact  Mouth: Oral mucosa is moist, no lesions on palate,  Neck: Supple, no deformities, masses, or tenderness Lungs: Normal respiratory effort, bilateral clear to auscultation, no crackles or wheezes.  Heart: Regular rate and rhythm, S1 and S2 normal, no murmurs, rubs auscultated Abdomen: BS normoactive,soft,nondistended,non-tender to palpation,no organomegaly Extremities: No pretibial edema, no erythema, no cyanosis, no clubbing Neuro : Alert and oriented to time, place and person, No focal deficits      Data Reviewed: I have personally reviewed following labs and imaging studies  CBG: Recent Labs  Lab 07/07/17 1538 07/07/17 1729 07/07/17 2129 07/08/17 0724 07/08/17 1110  GLUCAP 145* 170* 203* 233* 135*    CBC: Recent Labs  Lab 07/04/17 0529 07/05/17 0315 07/08/17 0243  WBC 9.7 6.9 8.4  NEUTROABS 7.5  --   --   HGB 15.0 14.7 12.6*  HCT 46.1 44.5 39.1  MCV 92.8 92.3 91.6  PLT 192 181 161    Basic Metabolic Panel: Recent Labs  Lab 07/04/17 0529 07/05/17 0315 07/06/17 0314 07/07/17 0307 07/08/17 0243  NA 139 139 139 138 138  K 3.8 3.7 3.8 4.1 4.5  CL 103 106 106 106 108  CO2 25 21* 23 23 24   GLUCOSE 138* 205* 211* 198* 222*  BUN 24* 34* 53* 60* 56*  CREATININE 1.42* 1.53* 1.68* 1.43* 1.47*  CALCIUM 9.0 8.8* 8.9 8.8* 8.6*    Recent Results (from the past 240 hour(s))  Gastrointestinal Panel by PCR , Stool     Status: None   Collection Time: 07/04/17  8:34 AM  Result Value Ref Range Status   Campylobacter species NOT DETECTED NOT DETECTED Final   Plesimonas shigelloides NOT DETECTED NOT DETECTED Final   Salmonella species NOT DETECTED NOT DETECTED Final   Yersinia  enterocolitica NOT DETECTED NOT DETECTED Final   Vibrio species NOT DETECTED NOT DETECTED Final   Vibrio cholerae NOT DETECTED NOT DETECTED Final   Enteroaggregative E coli (EAEC) NOT DETECTED NOT DETECTED Final   Enteropathogenic E coli (EPEC) NOT DETECTED NOT DETECTED Final   Enterotoxigenic E coli (ETEC) NOT DETECTED NOT DETECTED Final   Shiga like toxin producing E coli (STEC) NOT DETECTED NOT DETECTED Final   Shigella/Enteroinvasive E coli (EIEC) NOT DETECTED NOT DETECTED Final   Cryptosporidium NOT DETECTED NOT DETECTED Final   Cyclospora cayetanensis NOT DETECTED NOT DETECTED Final   Entamoeba histolytica NOT DETECTED NOT DETECTED Final   Giardia lamblia NOT DETECTED NOT DETECTED Final   Adenovirus F40/41 NOT DETECTED NOT DETECTED Final   Astrovirus NOT DETECTED NOT DETECTED Final   Norovirus GI/GII NOT DETECTED NOT DETECTED Final   Rotavirus A NOT DETECTED NOT DETECTED Final   Sapovirus (I, II, IV, and V) NOT DETECTED NOT DETECTED Final  C difficile quick scan w  PCR reflex     Status: Abnormal   Collection Time: 07/04/17  8:34 AM  Result Value Ref Range Status   C Diff antigen POSITIVE (A) NEGATIVE Final   C Diff toxin NEGATIVE NEGATIVE Final   C Diff interpretation Results are indeterminate. See PCR results.  Final  C. Diff by PCR, Reflexed     Status: None   Collection Time: 07/04/17  8:34 AM  Result Value Ref Range Status   Toxigenic C. Difficile by PCR NEGATIVE NEGATIVE Final    Comment: Patient is colonized with non toxigenic C. difficile. May not need treatment unless significant symptoms are present. Performed at [facility], [place] [address] [place], [place] [address]   MRSA PCR Screening     Status: None   Collection Time: 07/04/17  8:59 AM  Result Value Ref Range Status   MRSA by PCR NEGATIVE NEGATIVE Final    Comment:        The GeneXpert MRSA Assay (FDA approved for NASAL specimens only), is one component of a comprehensive MRSA  colonization surveillance program. It is not intended to diagnose MRSA infection nor to guide or monitor treatment for MRSA infections.   Culture, blood (routine x 2) Call MD if unable to obtain prior to antibiotics being given     Status: None (Preliminary result)   Collection Time: 07/04/17 11:20 AM  Result Value Ref Range Status   Specimen Description BLOOD LEFT ANTECUBITAL  Final   Special Requests   Final    BOTTLES DRAWN AEROBIC AND ANAEROBIC Blood Culture adequate volume   Culture   Final    NO GROWTH 3 DAYS Performed at [facility], [address], [place], [place] [address]    Report Status PENDING  Incomplete  Culture, blood (routine x 2) Call MD if unable to obtain prior to antibiotics being given     Status: None (Preliminary result)   Collection Time: 07/04/17 11:20 AM  Result Value Ref Range Status   Specimen Description BLOOD BLOOD RIGHT HAND  Final   Special Requests IN PEDIATRIC BOTTLE Blood Culture adequate volume  Final   Culture   Final    NO GROWTH 3 DAYS Performed at [facility], [place] [address] [place], [place] [address]    Report Status PENDING  Incomplete  Respiratory Panel by PCR     Status: None   Collection Time: 07/04/17 11:40 AM  Result Value Ref Range Status   Adenovirus NOT DETECTED NOT DETECTED Final   Coronavirus 229E NOT DETECTED NOT DETECTED Final   Coronavirus HKU1 NOT DETECTED NOT DETECTED Final   Coronavirus NL63 NOT DETECTED NOT DETECTED Final   Coronavirus OC43 NOT DETECTED NOT DETECTED Final   Metapneumovirus NOT DETECTED NOT DETECTED Final   Rhinovirus / Enterovirus NOT DETECTED NOT DETECTED Final   Influenza A NOT DETECTED NOT DETECTED Final   Influenza B NOT DETECTED NOT DETECTED Final   Parainfluenza Virus 1 NOT DETECTED NOT DETECTED Final   Parainfluenza Virus 2 NOT DETECTED NOT DETECTED Final   Parainfluenza Virus 3 NOT DETECTED NOT DETECTED Final   Parainfluenza Virus 4 NOT DETECTED NOT DETECTED Final    Respiratory Syncytial Virus NOT DETECTED NOT DETECTED Final   Bordetella pertussis NOT DETECTED NOT DETECTED Final   Chlamydophila pneumoniae NOT DETECTED NOT DETECTED Final   Mycoplasma pneumoniae NOT DETECTED NOT DETECTED Final    Comment: Performed at [facility], [place] [address], [place], [place] [address]  Culture, sputum-assessment     Status: None   Collection Time: 07/04/17  1:36 PM  Result Value Ref Range Status   Specimen Description SPUTUM  Final   Special Requests Normal  Final   Sputum evaluation THIS SPECIMEN IS ACCEPTABLE FOR SPUTUM CULTURE  Final   Report Status 07/04/2017 FINAL  Final  Culture, respiratory (NON-Expectorated)     Status: None   Collection Time: 07/04/17  1:36 PM  Result Value Ref Range Status   Specimen Description SPUTUM  Final   Special Requests Normal Reflexed from [id number]  Final   Gram Stain   Final    ABUNDANT WBC PRESENT, PREDOMINANTLY PMN RARE SQUAMOUS EPITHELIAL CELLS PRESENT MODERATE GRAM POSITIVE COCCI IN PAIRS MODERATE GRAM NEGATIVE COCCOBACILLI FEW GRAM NEGATIVE COCCI IN PAIRS FEW GRAM POSITIVE RODS    Culture   Final    Consistent with normal respiratory flora. Performed at [facility], [place] [address], [place], [place] [address]    Report Status 07/07/2017 FINAL  Final     Liver Function Tests: Recent Labs  Lab 07/05/17 0315 07/06/17 0314  AST 16 17  ALT 14* 15*  ALKPHOS 63 57  BILITOT 1.4* 0.6  PROT 6.7 6.5  ALBUMIN 3.5 3.4*   No results for input(s): LIPASE, AMYLASE in the last 168 hours. No results for input(s): AMMONIA in the last 168 hours.  Cardiac Enzymes: Recent Labs  Lab 07/04/17 0529 07/04/17 1120 07/04/17 1430 07/04/17 2018  TROPONINI 0.08* 0.08* 0.09* 0.06*   BNP (last 3 results) Recent Labs    03/26/17 1723 07/04/17 0530  BNP 227.8* 179.0*    ProBNP (last 3 results) No results for input(s): PROBNP in the last 8760 hours.    Studies: No results found.  Scheduled Meds: .  albuterol  2.5 mg Nebulization TID  . finasteride  5 mg Oral Daily  . insulin aspart  0-5 Units Subcutaneous QHS  . insulin aspart  0-9 Units Subcutaneous TID WC  . mouth rinse  15 mL Mouth Rinse BID  . methylPREDNISolone (SOLU-MEDROL) injection  80 mg Intravenous Q12H  . metoprolol tartrate  12.5 mg Oral BID  . potassium chloride SA  20 mEq Oral Daily  . sodium chloride flush  3 mL Intravenous Q12H  . tamsulosin  0.4 mg Oral QPC supper  . temazepam  30 mg Oral QHS  . tiotropium  18 mcg Inhalation Daily  . Warfarin - Pharmacist Dosing Inpatient   Does not apply q1800      Time spent: 20 min  [facility] Pager [phone]. If 7PM-7AM, please contact night-coverage at [email or web address], Office  [phone]  password [email or web address]  07/08/2017, 2:00 PM  LOS: 4 days

## 2017-07-08 NOTE — Progress Notes (Signed)
ANTICOAGULATION CONSULT NOTE - Follow Up  Pharmacy Consult for warfarin Indication: atrial fibrillation  No Known Allergies  Patient Measurements: Height: 5\' 7"  (170.2 cm) Weight: 183 lb 6.8 oz (83.2 kg) IBW/kg (Calculated) : 66.1  Vital Signs: Temp: 97.5 F (36.4 C) (12/20 0741) Temp Source: Oral (12/20 0741) BP: 145/65 (12/20 0600) Pulse Rate: 58 (12/20 0700)  Labs: Recent Labs    07/06/17 0314 07/07/17 0307 07/08/17 0243  HGB  --   --  12.6*  HCT  --   --  39.1  PLT  --   --  193  LABPROT 22.2* 26.9* 33.1*  INR 1.97 2.51 3.28  CREATININE 1.68* 1.43* 1.47*    Estimated Creatinine Clearance: 33.1 mL/min (A) (by C-G formula based on SCr of 1.47 mg/dL (H)).   Assessment: 41 YOM presents with productive cough and suspected CAP. He is on warfarin prior to admission for h/o atrial fibrillation.  Pharmacy asked to dose warfarin while in hospital.  Home warfarin dosing: 1.5 mg daily except 3mg  on Mon.  Appears has missed last several doses  Today, 07/08/2017  INR now supra-therapeutic (3.28), significant increase over past 2 days (boosted dose given x 1 on admit)  CBC: drop in Hgb today, pltc WNL, stable  No reported bleeding  Drug interactions: none major; on broad-spectrum abx  Eating 100% of meals  BNP elevated  Goal of Therapy:  INR 2-3   Plan:   Hold Warfarin tonight  Daily INR  Netta Cedars, PharmD, BCPS Pager: [phone] 07/08/2017, 9:15 AM

## 2017-07-08 NOTE — Progress Notes (Signed)
Date: July 08, 2017 Velva Harman, BSN, [place], [place] Chart and notes review for patient progress and needs. hfnc Will follow for case management and discharge needs. Next review date: [id number]

## 2017-07-08 NOTE — Progress Notes (Signed)
PHARMACIST - PHYSICIAN COMMUNICATION DR:  Darrick Meigs CONCERNING: Antibiotic IV to Oral Route Change Policy  RECOMMENDATION: This patient is receiving Zithromax by the intravenous route.  Based on criteria approved by the Pharmacy and Therapeutics Committee, the antibiotic(s) is/are being converted to the equivalent oral dose form(s).   DESCRIPTION: These criteria include:  Patient being treated for a respiratory tract infection, urinary tract infection, cellulitis or clostridium difficile associated diarrhea if on metronidazole  The patient is not neutropenic and does not exhibit a GI malabsorption state  The patient is eating (either orally or via tube) and/or has been taking other orally administered medications for a least 24 hours  The patient is improving clinically and has a Tmax < 100.5  If you have questions about this conversion, please contact the Pharmacy Department  []   [phone] )  [facility] []   [phone] )  [facility] []   [phone] )  [facility] []   [phone] )  [facility] [x]   [phone] )  Painter, PharmD, BCPS 07/08/2017@2 :47 PM

## 2017-07-08 NOTE — Progress Notes (Signed)
Physical Therapy Treatment Patient Details Name: Ricardo Perkins MRN: [id number] DOB: [birth year] Today's Date: 07/08/2017    History of Present Illness 83 yo male admitted with Pna. hx of CKD, CHF, A fib, COPD-O2 dep, chronic diarrhea, fall, OA.     PT Comments    Pt requiring increased oxygen with physical exertion (see mobility section below).  Pt also required cues to take rest breaks and for breathing.  IF 24/7 assist is not available upon d/c, pt may need SNF.  Follow Up Recommendations  Home health PT;Supervision/Assistance - 24 hour     Equipment Recommendations  Rolling walker with 5" wheels    Recommendations for Other Services       Precautions / Restrictions Precautions Precautions: Fall Precaution Comments: high O2 requirement. monitor sats.    Mobility  Bed Mobility Overal bed mobility: Needs Assistance Bed Mobility: Supine to Sit     Supine to sit: Supervision     General bed mobility comments: for safety, lines.   Transfers Overall transfer level: Needs assistance Equipment used: Rolling walker (2 wheeled) Transfers: Sit to/from Stand Sit to Stand: Min assist         General transfer comment: min/guard to stand, verbal cues for hand placement, pt required assist with return to sitting as he was attempting to sit while turning - uncontrolled descent  Ambulation/Gait Ambulation/Gait assistance: Min assist Ambulation Distance (Feet): 120 Feet Assistive device: Rolling walker (2 wheeled) Gait Pattern/deviations: Step-through pattern;Decreased stride length;Trunk flexed     General Gait Details: attempted 60 feet without RW and pt requiring increased assist, 2 standing rest breaks due to dropping saturations, provided RW for returning to room and had coworker follow with recliner, SPO2 dropped to 70% on 6L however improved to 89% on 8L O2 and cues for breathing during rest break, 93% back on 6L O2 prior to leaving room; pt requiring increased safety  cues    Stairs            Wheelchair Mobility    Modified Rankin (Stroke Patients Only)       Balance                                            Cognition Arousal/Alertness: Awake/alert Behavior During Therapy: WFL for tasks assessed/performed Overall Cognitive Status: Within Functional Limits for tasks assessed                                        Exercises      General Comments        Pertinent Vitals/Pain Pain Assessment: No/denies pain    Home Living                      Prior Function            PT Goals (current goals can now be found in the care plan section) Progress towards PT goals: Progressing toward goals    Frequency    Min 3X/week      PT Plan Current plan remains appropriate    Co-evaluation              AM-PAC PT "6 Clicks" Daily Activity  Outcome Measure  Difficulty turning over in bed (including adjusting bedclothes, sheets and blankets)?: A Little  Difficulty moving from lying on back to sitting on the side of the bed? : A Little Difficulty sitting down on and standing up from a chair with arms (Perkins.g., wheelchair, bedside commode, etc,.)?: Unable Help needed moving to and from a bed to chair (including a wheelchair)?: A Little Help needed walking in hospital room?: A Lot Help needed climbing 3-5 steps with a railing? : A Lot 6 Click Score: 14    End of Session Equipment Utilized During Treatment: Gait belt;Oxygen Activity Tolerance: Patient tolerated treatment well Patient left: in chair;with chair alarm set;with call bell/phone within reach Nurse Communication: Mobility status PT Visit Diagnosis: Muscle weakness (generalized) (M62.81);Difficulty in walking, not elsewhere classified (R26.2)     Time: 1610-9604 PT Time Calculation (min) (ACUTE ONLY): 21 min  Charges:  $Gait Training: 8-22 mins                    G Codes:      Ricardo Perkins, PT, DPT 07/08/2017 Pager:  [phone]  Ricardo Perkins 07/08/2017, 4:17 PM

## 2017-07-09 DIAGNOSIS — N183 Chronic kidney disease, stage 3 (moderate): Secondary | ICD-10-CM

## 2017-07-09 LAB — CBC
HEMATOCRIT: 38.4 % — AB (ref 39.0–52.0)
Hemoglobin: 12.6 g/dL — ABNORMAL LOW (ref 13.0–17.0)
MCH: 29.8 pg (ref 26.0–34.0)
MCHC: 32.8 g/dL (ref 30.0–36.0)
MCV: 90.8 fL (ref 78.0–100.0)
Platelets: 179 10*3/uL (ref 150–400)
RBC: 4.23 MIL/uL (ref 4.22–5.81)
RDW: 15.1 % (ref 11.5–15.5)
WBC: 9.7 10*3/uL (ref 4.0–10.5)

## 2017-07-09 LAB — BASIC METABOLIC PANEL
Anion gap: 7 (ref 5–15)
BUN: 47 mg/dL — AB (ref 6–20)
CHLORIDE: 108 mmol/L (ref 101–111)
CO2: 24 mmol/L (ref 22–32)
Calcium: 8.7 mg/dL — ABNORMAL LOW (ref 8.9–10.3)
Creatinine, Ser: 1.23 mg/dL (ref 0.61–1.24)
GFR calc Af Amer: 57 mL/min — ABNORMAL LOW (ref >60)
GFR calc non Af Amer: 49 mL/min — ABNORMAL LOW (ref >60)
Glucose, Bld: 117 mg/dL — ABNORMAL HIGH (ref 65–99)
POTASSIUM: 4.7 mmol/L (ref 3.5–5.1)
SODIUM: 139 mmol/L (ref 135–145)

## 2017-07-09 LAB — CULTURE, BLOOD (ROUTINE X 2)
CULTURE: NO GROWTH
CULTURE: NO GROWTH
Special Requests: ADEQUATE
Special Requests: ADEQUATE

## 2017-07-09 LAB — PROTIME-INR
INR: 4.19
Prothrombin Time: 40.1 seconds — ABNORMAL HIGH (ref 11.4–15.2)

## 2017-07-09 LAB — GLUCOSE, CAPILLARY
Glucose-Capillary: 145 mg/dL — ABNORMAL HIGH (ref 65–99)
Glucose-Capillary: 115 mg/dL — ABNORMAL HIGH (ref 65–99)

## 2017-07-09 MED ORDER — LEVOFLOXACIN 500 MG PO TABS: 500.0000 mg | ORAL_TABLET | Freq: Every day | ORAL | 0 refills | Status: AC

## 2017-07-09 MED ORDER — PREDNISONE 10 MG PO TABS: ORAL_TABLET | ORAL | 0 refills | Status: DC

## 2017-07-09 MED ORDER — TIOTROPIUM BROMIDE MONOHYDRATE 18 MCG IN CAPS: 18.0000 ug | ORAL_CAPSULE | Freq: Every day | RESPIRATORY_TRACT | 12 refills | Status: AC

## 2017-07-09 NOTE — Progress Notes (Signed)
D/c to home w/ dtr via w/c all d/c instructions given w/ verbal understanding.pt has own O2 at 5L [place]/

## 2017-07-09 NOTE — Progress Notes (Signed)
Called by RN that patient's daughter called after patient reached home, she was upset the patient is very weak and should not have been discharged. I called and discussed with patients not on phone, she told me that patient ambulated in the hallway in the hospital with the walker without oxygen, and also was sitting in the bathroom commode without oxygen. She was upset that nurse and nurse aide, made patient ambulate in the hallway without oxygen.   At home patient was sitting in the chair with 5 L of oxygen via nasal cannula, and daughter. was upset that he would not be able to ambulate on his own.  I explained to the daughter that physical therapy recommended home health PT, and patient was clinically stable and back to his baseline requirement of oxygen of 5 L per minute via nasal cannula. We ordered home health physical therapy, which should make patient regain some strength over next few days. I also explained to her, that if patient again becomes short of breath, she should bring him back to the emergency room.

## 2017-07-09 NOTE — Discharge Summary (Signed)
Physician Discharge Summary  Ricardo Perkins ENI:[id number] DOB: 05-Nov-1922 DOA: 07/04/2017  PCP: [name], MD  Admit date: 07/04/2017 Discharge date: 07/09/2017  Time spent: 35 minutes  Recommendations for Outpatient Follow-up:  1. Follow up PCP in 2 weeks 2. [place] RN to check PT/INR on Monday  Discharge Diagnoses:  Active Problems:   PAROXYSMAL ATRIAL FIBRILLATION   CKD (chronic kidney disease) stage 3, GFR 30-59 ml/min (HCC)   CAP (community acquired pneumonia)   Tachycardia   Elevated troponin   Diarrhea   Discharge Condition: Stable  Diet recommendation: heart healthy diet  Filed Weights   07/04/17 0859 07/05/17 0600 07/08/17 0359  Weight: 81.5 kg (179 lb 10.8 oz) 79.9 kg (176 lb 2.4 oz) 83.2 kg (183 lb 6.8 oz)    History of present illness:  83 y.o.malewith a history of COPD, CHF, PAF who presented to the ED for weakness and dyspnea for 2 days with associated cough and low-grade fevers as well as hypoxia measured as the mid-80's despite home 4L of oxygen. Hypoexpanded CXR demonstrated bibasilar opacities and underlying vascular congestion. Ceftriaxone and azithromycin were provided for pneumonia, duonebs provided for concomitant COPD exacerbation, and he was admitted to the SDU for acute hypoxic respiratory failure    Hospital Course:  1. Acute on chronic hypoxic respiratory failure-due to community-acquired pneumonia and COPD exacerbation. Continue supplemental oxygen. Oxygen has been weaned off to 5 L per minute  nasal cannula. 2. Bibasilar community acquired pneumonia- strep pneumo antigen is negative. Patient was started on  ceftriaxone and Zithromax. Completed 6 days of antibiotics in the hospital.Will discharge home on one more dose of Levaquin 500 mg po x 1 on 07/10/17 3. COPD exacerbation- continue Spiriva, will change IV steroids to by mouth prednisone. Continue albuterol every 6 hours when necessary. Will discharge on Prednisone taper for 5 days then  continue Prednisone 10 mg po daily. 4. Generalized weakness- due to acute illness with significant chronic comorbidities. PT recommending home health physical therapy. 5. Paroxysmal atrial fibrillation with intermittent sinus tachycardia- heart rate is stable. Continue Coumadin, metoprolol. INR is elevated, hold coumadin for 3 days.restart Coumadin on Monday 07/12/17 6. Hyperglycemia due to steroids.- Hemoglobin A1c 6.3%, was started on sliding scale insulin with NovoLog.IV steroids have been discontinued. 7. Troponin elevation-patient had mild troponin elevation. No chest pain. No wall motion abnormalities seen on echocardiogram. Troponin is trending downwards. 8. Chronic diastolic CHF- euvolemic. Continue lasix 20 mg daily. 9. Diarrhea- chronic, C. difficile antigen is positive with negative toxin. Consistent with negative results. GI pathogen panel is also negative. Continue when necessary Imodium 10. Hypertension- continue amlodipine, Metoprolol.     Procedures:  None   Consultations:  None   Discharge Exam: Vitals:   07/08/17 2219 07/09/17 0351  BP: 127/72 131/85  Pulse: 63 61  Resp: 20 19  Temp: 98.5 F (36.9 C) 98.2 F (36.8 C)  SpO2: 94% 95%    General: appears in no acute distress Cardiovascular: S1S2, RRR Respiratory: Clear bilaterally  Discharge Instructions   Discharge Instructions    Diet - low sodium heart healthy   Complete by:  As directed    Discharge instructions   Complete by:  As directed    Hold coumadin for 3 days, start on Monday 07/12/17   Increase activity slowly   Complete by:  As directed      Allergies as of 07/09/2017   No Known Allergies     Medication List    TAKE these medications  albuterol (2.5 MG/3ML) 0.083% nebulizer solution Commonly known as:  PROVENTIL Take 1 ampule by nebulization every 6 (six) hours as needed for wheezing.   amLODipine 10 MG tablet Commonly known as:  NORVASC Take 1 tablet (10 mg total) by mouth  daily.   finasteride 5 MG tablet Commonly known as:  PROSCAR TAKE 1 TABLET(5 MG) BY MOUTH DAILY   furosemide 20 MG tablet Commonly known as:  LASIX Take 1 tablet (20 mg total) by mouth daily.   levofloxacin 500 MG tablet Commonly known as:  LEVAQUIN Take 1 tablet (500 mg total) by mouth daily for 1 day. Start taking on:  07/10/2017   lidocaine 5 % Commonly known as:  LIDODERM Place 1 patch onto the skin daily. Remove & Discard patch within 12 hours or as directed by MD What changed:    when to take this  reasons to take this  additional instructions   metoprolol tartrate 25 MG tablet Commonly known as:  LOPRESSOR Take 0.5 tablets (12.5 mg total) by mouth 2 (two) times daily.   potassium chloride SA 20 MEQ tablet Commonly known as:  K-DUR,KLOR-CON Take 1 tablet (20 mEq total) by mouth daily.   predniSONE 10 MG tablet Commonly known as:  DELTASONE Take 1 tablet (10 mg total) by mouth daily with breakfast. What changed:  Another medication with the same name was added. Make sure you understand how and when to take each.   predniSONE 10 MG tablet Commonly known as:  DELTASONE Prednisone 40 mg po daily x 1 day then Prednisone 30 mg po daily x 1 day then Prednisone 20 mg po daily x 1 day then Prednisone 10 mg daily What changed:  You were already taking a medication with the same name, and this prescription was added. Make sure you understand how and when to take each.   tamsulosin 0.4 MG Caps capsule Commonly known as:  FLOMAX Take 1 capsule (0.4 mg total) by mouth daily after supper.   temazepam 30 MG capsule Commonly known as:  RESTORIL TAKE ONE CAPSULE BY MOUTH EVERY NIGHT AT BEDTIME   tiotropium 18 MCG inhalation capsule Commonly known as:  SPIRIVA Place 1 capsule (18 mcg total) into inhaler and inhale daily. Start taking on:  07/10/2017   traMADol 50 MG tablet Commonly known as:  ULTRAM Take 1 tablet (50 mg total) by mouth every 12 (twelve) hours as  needed. What changed:  when to take this   warfarin 3 MG tablet Commonly known as:  COUMADIN Take as directed. If you are unsure how to take this medication, talk to your nurse or doctor. Original instructions:  AS DIRECTED What changed:  additional instructions      No Known Allergies    The results of significant diagnostics from this hospitalization (including imaging, microbiology, ancillary and laboratory) are listed below for reference.    Significant Diagnostic Studies: Dg Chest 2 View  Result Date: 07/04/2017 CLINICAL DATA:  Acute onset of productive cough. Decreased O2 saturation. EXAM: CHEST  2 VIEW COMPARISON:  Chest radiograph performed 03/27/2017 FINDINGS: The lungs are hypoexpanded. Bibasilar airspace opacities raise concern for pneumonia. Underlying vascular crowding and vascular congestion are noted. No pleural effusion or pneumothorax is seen. The cardiomediastinal silhouette is borderline normal in size. No acute osseous abnormalities are identified. IMPRESSION: Lungs hypoexpanded. Bibasilar airspace opacities raise concern for pneumonia. Underlying vascular congestion noted. Electronically Signed   By: Garald Balding M.D.   On: 07/04/2017 05:27   Dg Ribs Unilateral Right  Result Date: 07/06/2017 CLINICAL DATA:  Rib tenderness on the right no injury EXAM: RIGHT RIBS - 2 VIEW COMPARISON:  07/04/2017 FINDINGS: No fracture or other bone lesions are seen involving the ribs. IMPRESSION: Negative. Electronically Signed   By: Franchot Gallo M.D.   On: 07/06/2017 11:09    Microbiology: Recent Results (from the past 240 hour(s))  Gastrointestinal Panel by PCR , Stool     Status: None   Collection Time: 07/04/17  8:34 AM  Result Value Ref Range Status   Campylobacter species NOT DETECTED NOT DETECTED Final   Plesimonas shigelloides NOT DETECTED NOT DETECTED Final   Salmonella species NOT DETECTED NOT DETECTED Final   Yersinia enterocolitica NOT DETECTED NOT DETECTED Final    Vibrio species NOT DETECTED NOT DETECTED Final   Vibrio cholerae NOT DETECTED NOT DETECTED Final   Enteroaggregative E coli (EAEC) NOT DETECTED NOT DETECTED Final   Enteropathogenic E coli (EPEC) NOT DETECTED NOT DETECTED Final   Enterotoxigenic E coli (ETEC) NOT DETECTED NOT DETECTED Final   Shiga like toxin producing E coli (STEC) NOT DETECTED NOT DETECTED Final   Shigella/Enteroinvasive E coli (EIEC) NOT DETECTED NOT DETECTED Final   Cryptosporidium NOT DETECTED NOT DETECTED Final   Cyclospora cayetanensis NOT DETECTED NOT DETECTED Final   Entamoeba histolytica NOT DETECTED NOT DETECTED Final   Giardia lamblia NOT DETECTED NOT DETECTED Final   Adenovirus F40/41 NOT DETECTED NOT DETECTED Final   Astrovirus NOT DETECTED NOT DETECTED Final   Norovirus GI/GII NOT DETECTED NOT DETECTED Final   Rotavirus A NOT DETECTED NOT DETECTED Final   Sapovirus (I, II, IV, and V) NOT DETECTED NOT DETECTED Final  C difficile quick scan w PCR reflex     Status: Abnormal   Collection Time: 07/04/17  8:34 AM  Result Value Ref Range Status   C Diff antigen POSITIVE (A) NEGATIVE Final   C Diff toxin NEGATIVE NEGATIVE Final   C Diff interpretation Results are indeterminate. See PCR results.  Final  C. Diff by PCR, Reflexed     Status: None   Collection Time: 07/04/17  8:34 AM  Result Value Ref Range Status   Toxigenic C. Difficile by PCR NEGATIVE NEGATIVE Final    Comment: Patient is colonized with non toxigenic C. difficile. May not need treatment unless significant symptoms are present. Performed at [facility], [place] [address], [place], [place] [address]   MRSA PCR Screening     Status: None   Collection Time: 07/04/17  8:59 AM  Result Value Ref Range Status   MRSA by PCR NEGATIVE NEGATIVE Final    Comment:        The GeneXpert MRSA Assay (FDA approved for NASAL specimens only), is one component of a comprehensive MRSA colonization surveillance program. It is not intended to diagnose  MRSA infection nor to guide or monitor treatment for MRSA infections.   Culture, blood (routine x 2) Call MD if unable to obtain prior to antibiotics being given     Status: None (Preliminary result)   Collection Time: 07/04/17 11:20 AM  Result Value Ref Range Status   Specimen Description BLOOD LEFT ANTECUBITAL  Final   Special Requests   Final    BOTTLES DRAWN AEROBIC AND ANAEROBIC Blood Culture adequate volume   Culture   Final    NO GROWTH 4 DAYS Performed at [facility], [address], [place], [place] [address]    Report Status PENDING  Incomplete  Culture, blood (routine x  2) Call MD if unable to obtain prior to antibiotics being given     Status: None (Preliminary result)   Collection Time: 07/04/17 11:20 AM  Result Value Ref Range Status   Specimen Description BLOOD BLOOD RIGHT HAND  Final   Special Requests IN PEDIATRIC BOTTLE Blood Culture adequate volume  Final   Culture   Final    NO GROWTH 4 DAYS Performed at [facility], [place] [address], [place], [place] [address]    Report Status PENDING  Incomplete  Respiratory Panel by PCR     Status: None   Collection Time: 07/04/17 11:40 AM  Result Value Ref Range Status   Adenovirus NOT DETECTED NOT DETECTED Final   Coronavirus 229E NOT DETECTED NOT DETECTED Final   Coronavirus HKU1 NOT DETECTED NOT DETECTED Final   Coronavirus NL63 NOT DETECTED NOT DETECTED Final   Coronavirus OC43 NOT DETECTED NOT DETECTED Final   Metapneumovirus NOT DETECTED NOT DETECTED Final   Rhinovirus / Enterovirus NOT DETECTED NOT DETECTED Final   Influenza A NOT DETECTED NOT DETECTED Final   Influenza B NOT DETECTED NOT DETECTED Final   Parainfluenza Virus 1 NOT DETECTED NOT DETECTED Final   Parainfluenza Virus 2 NOT DETECTED NOT DETECTED Final   Parainfluenza Virus 3 NOT DETECTED NOT DETECTED Final   Parainfluenza Virus 4 NOT DETECTED NOT DETECTED Final   Respiratory Syncytial Virus NOT DETECTED NOT DETECTED Final   Bordetella  pertussis NOT DETECTED NOT DETECTED Final   Chlamydophila pneumoniae NOT DETECTED NOT DETECTED Final   Mycoplasma pneumoniae NOT DETECTED NOT DETECTED Final    Comment: Performed at [facility], [place] [address] [place], [place] [address]  Culture, sputum-assessment     Status: None   Collection Time: 07/04/17  1:36 PM  Result Value Ref Range Status   Specimen Description SPUTUM  Final   Special Requests Normal  Final   Sputum evaluation THIS SPECIMEN IS ACCEPTABLE FOR SPUTUM CULTURE  Final   Report Status 07/04/2017 FINAL  Final  Culture, respiratory (NON-Expectorated)     Status: None   Collection Time: 07/04/17  1:36 PM  Result Value Ref Range Status   Specimen Description SPUTUM  Final   Special Requests Normal Reflexed from [id number]  Final   Gram Stain   Final    ABUNDANT WBC PRESENT, PREDOMINANTLY PMN RARE SQUAMOUS EPITHELIAL CELLS PRESENT MODERATE GRAM POSITIVE COCCI IN PAIRS MODERATE GRAM NEGATIVE COCCOBACILLI FEW GRAM NEGATIVE COCCI IN PAIRS FEW GRAM POSITIVE RODS    Culture   Final    Consistent with normal respiratory flora. Performed at [facility], [place] [address], [place], [place] [address]    Report Status 07/07/2017 FINAL  Final     Labs: Basic Metabolic Panel: Recent Labs  Lab 07/05/17 0315 07/06/17 0314 07/07/17 0307 07/08/17 0243 07/09/17 0527  NA 139 139 138 138 139  K 3.7 3.8 4.1 4.5 4.7  CL 106 106 106 108 108  CO2 21* 23 23 24 24   GLUCOSE 205* 211* 198* 222* 117*  BUN 34* 53* 60* 56* 47*  CREATININE 1.53* 1.68* 1.43* 1.47* 1.23  CALCIUM 8.8* 8.9 8.8* 8.6* 8.7*   Liver Function Tests: Recent Labs  Lab 07/05/17 0315 07/06/17 0314  AST 16 17  ALT 14* 15*  ALKPHOS 63 57  BILITOT 1.4* 0.6  PROT 6.7 6.5  ALBUMIN 3.5 3.4*   No results for input(s): LIPASE, AMYLASE in the last 168 hours. No results for input(s): AMMONIA in the  last 168 hours. CBC: Recent Labs  Lab 07/04/17 0529 07/05/17 0315 07/08/17 0243 07/09/17 0527   WBC 9.7 6.9 8.4 9.7  NEUTROABS 7.5  --   --   --   HGB 15.0 14.7 12.6* 12.6*  HCT 46.1 44.5 39.1 38.4*  MCV 92.8 92.3 91.6 90.8  PLT 192 181 193 179   Cardiac Enzymes: Recent Labs  Lab 07/04/17 0529 07/04/17 1120 07/04/17 1430 07/04/17 2018  TROPONINI 0.08* 0.08* 0.09* 0.06*   BNP: BNP (last 3 results) Recent Labs    03/26/17 1723 07/04/17 0530  BNP 227.8* 179.0*    ProBNP (last 3 results) No results for input(s): PROBNP in the last 8760 hours.  CBG: Recent Labs  Lab 07/08/17 1110 07/08/17 1527 07/08/17 1718 07/08/17 2219 07/09/17 0741  GLUCAP 135* 176* 176* 252* 145*       Signed:  Oswald Hillock MD.  [facility] 07/09/2017, 11:33 AM

## 2017-07-09 NOTE — Progress Notes (Signed)
SATURATION QUALIFICATIONS: (This note is used to comply with regulatory documentation for home oxygen)  Patient Saturations on Room Air at Rest = 84%  Patient Saturations on Room Air while Ambulating = 84%  Patient Saturations on 5 Liters of oxygen while Ambulating = 94%  Please briefly explain why patient needs home oxygen:

## 2017-07-09 NOTE — Care Management Note (Signed)
Case Management Note  Patient Details  Name: GLENVILLE ESPINA MRN: [id number] Date of Birth: September 23, 1922  Subjective/Objective:                    Action/Plan:   Expected Discharge Date:  07/09/17               Expected Discharge Plan:  [place]  In-House Referral:     Discharge planning Services  CM Consult  Post Acute Care Choice:    Choice offered to:  Adult Children  DME Arranged:    DME Agency:     HH Arranged:  RN, PT HH Agency:  [facility]  Status of Service:  Completed, signed off  If discussed at [place] of Stay Meetings, dates discussed:    Additional CommentsPurcell Mouton, RN 07/09/2017, 2:42 PM

## 2017-07-09 NOTE — Progress Notes (Signed)
D/c tp home w/ HH all d/c instructions given w/ verbal understanding .Awaiting ride and CM for [facility] needs.

## 2017-07-09 NOTE — Progress Notes (Signed)
ANTICOAGULATION CONSULT NOTE - Follow Up  Pharmacy Consult for warfarin Indication: atrial fibrillation  No Known Allergies  Patient Measurements: Height: 5\' 7"  (170.2 cm) Weight: 183 lb 6.8 oz (83.2 kg) IBW/kg (Calculated) : 66.1  Vital Signs: Temp: 98.2 F (36.8 C) (12/21 0351) Temp Source: Oral (12/21 0351) BP: 131/85 (12/21 0351) Pulse Rate: 61 (12/21 0351)  Labs: Recent Labs    07/07/17 0307 07/08/17 0243 07/09/17 0527  HGB  --  12.6* 12.6*  HCT  --  39.1 38.4*  PLT  --  193 179  LABPROT 26.9* 33.1* 40.1*  INR 2.51 3.28 4.19*  CREATININE 1.43* 1.47* 1.23    Estimated Creatinine Clearance: 39.5 mL/min (by C-G formula based on SCr of 1.23 mg/dL).   Assessment: 53 YOM presents with productive cough and suspected CAP. He is on warfarin prior to admission for h/o atrial fibrillation. Pharmacy asked to dose warfarin while in hospital.  Home warfarin dosing: 1.5 mg daily except 3mg  on Mon.  Appears has missed last several doses  Today, 07/09/2017  INR supratherapeutic and continues to rise, likely d/t boosted dose on admission, acute illness, and possible hepatic congestion (BNP slightly elevated)  CBC: drop in Hgb today, pltc WNL, stable  No reported bleeding per RN  Drug interactions: none major; on broad-spectrum abx  Eating 100% of meals  Goal of Therapy:  INR 2-3   Plan:   Continue to hold warfarin  Planning for discharge today; agree with continuing to hold with INR recheck on Monday 12/24  Consider oral vitamin K if minor bleeding/bruising noted or INR continues to rise  Daily INR while admitted  Reuel Boom, PharmD, BCPS Pager: [phone] 07/09/2017, 11:35 AM

## 2017-07-12 ENCOUNTER — Ambulatory Visit (INDEPENDENT_AMBULATORY_CARE_PROVIDER_SITE_OTHER): Payer: Medicare Other | Admitting: General Practice

## 2017-07-12 DIAGNOSIS — Z7901 Long term (current) use of anticoagulants: Secondary | ICD-10-CM

## 2017-07-12 DIAGNOSIS — J189 Pneumonia, unspecified organism: Secondary | ICD-10-CM | POA: Diagnosis not present

## 2017-07-12 DIAGNOSIS — J440 Chronic obstructive pulmonary disease with acute lower respiratory infection: Secondary | ICD-10-CM | POA: Diagnosis not present

## 2017-07-12 LAB — POCT INR: INR: 1.7

## 2017-07-12 NOTE — Patient Instructions (Addendum)
Pre visit review using our clinic review tool, if applicable. No additional management support is needed unless otherwise documented below in the visit note.  Take extra 1/2 tablet today and then continue to take 1/2 tablet on all days except 1 tablet on Mon.   Re-check in 2 weeks. Dosing instructions given to patient's daughter.   Monitored by [facility] home monitoring service.

## 2017-07-12 NOTE — Progress Notes (Signed)
I agree with this plan.

## 2017-07-15 ENCOUNTER — Telehealth: Payer: Self-pay | Admitting: Internal Medicine

## 2017-07-15 DIAGNOSIS — J189 Pneumonia, unspecified organism: Secondary | ICD-10-CM | POA: Diagnosis not present

## 2017-07-15 DIAGNOSIS — J440 Chronic obstructive pulmonary disease with acute lower respiratory infection: Secondary | ICD-10-CM | POA: Diagnosis not present

## 2017-07-15 NOTE — Telephone Encounter (Signed)
Copied from [place] [phone]. Topic: General - Other >> Jul 15, 2017  2:51 PM Darl Householder, RMA wrote: Reason for CRM: patient's daughter Urban Gibson is calling to see if Dr. Sharlet Salina wants pt to see if he needs to see her for hosp f/u or with Pulmonary. Please return pt call at [phone]

## 2017-07-15 NOTE — Telephone Encounter (Signed)
Called patient daughter and the in home Nurse from [facility] needs an order for [employer], this is a Xray that can be brought in the home, their phone number is [phone] The patient fell on 12/26 on his artificial knee and it has turned. Please advise on order  [facility] appt made for 1/8, the patient will be brought by ambulance,

## 2017-07-16 ENCOUNTER — Other Ambulatory Visit: Payer: Self-pay | Admitting: Internal Medicine

## 2017-07-16 NOTE — Telephone Encounter (Signed)
Left message advising daughter that dr crawford not in the office today, but I could get verbal order from another provider for mobile xray---however, this is Friday afternoon before a weekend and I dont see how xray could be done soon enough to have anything done before Monday---i'm not sure how bad patient's knee is (knee has turned??), but it may be a better option for patient to go to ED via ambulance so that he can get care now---also she may want to try orthopedic urgent care I think [facility] and [employer] office has available, but i'm not sure ---she may try calling that office to find out---can call our office back if any further questions

## 2017-07-16 NOTE — Telephone Encounter (Signed)
[place] Controlled Substance Database checked. Last filled on 05/07/17.  Please advise in Ricardo Perkins's absence

## 2017-07-19 DIAGNOSIS — J440 Chronic obstructive pulmonary disease with acute lower respiratory infection: Secondary | ICD-10-CM | POA: Diagnosis not present

## 2017-07-19 DIAGNOSIS — J189 Pneumonia, unspecified organism: Secondary | ICD-10-CM | POA: Diagnosis not present

## 2017-07-21 ENCOUNTER — Telehealth: Payer: Self-pay | Admitting: Internal Medicine

## 2017-07-21 DIAGNOSIS — J189 Pneumonia, unspecified organism: Secondary | ICD-10-CM | POA: Diagnosis not present

## 2017-07-21 DIAGNOSIS — J440 Chronic obstructive pulmonary disease with acute lower respiratory infection: Secondary | ICD-10-CM | POA: Diagnosis not present

## 2017-07-21 NOTE — Telephone Encounter (Signed)
Copied from [place]. Topic: Quick Communication - See Telephone Encounter >> Jul 21, 2017  1:01 PM Vernona Rieger wrote: CRM for notification. See Telephone encounter for:   07/21/17.  Heather from [facility] is wanting verbal orders for an extension for one week for Nursing Skills (COPD and CHF mgt) Call back is [phone]

## 2017-07-22 ENCOUNTER — Other Ambulatory Visit: Payer: Self-pay | Admitting: Internal Medicine

## 2017-07-22 LAB — POCT INR: INR: 2.2

## 2017-07-22 NOTE — Telephone Encounter (Signed)
Fine

## 2017-07-22 NOTE — Telephone Encounter (Signed)
Notified heather w/MD response...Johny Chess

## 2017-07-23 ENCOUNTER — Ambulatory Visit (INDEPENDENT_AMBULATORY_CARE_PROVIDER_SITE_OTHER): Payer: Medicare Other | Admitting: General Practice

## 2017-07-23 DIAGNOSIS — J440 Chronic obstructive pulmonary disease with acute lower respiratory infection: Secondary | ICD-10-CM | POA: Diagnosis not present

## 2017-07-23 DIAGNOSIS — Z7901 Long term (current) use of anticoagulants: Secondary | ICD-10-CM

## 2017-07-23 DIAGNOSIS — J189 Pneumonia, unspecified organism: Secondary | ICD-10-CM | POA: Diagnosis not present

## 2017-07-23 NOTE — Progress Notes (Signed)
Medical treatment/procedure(s) were performed by non-physician practitioner and as supervising physician I was immediately available for consultation/collaboration. I agree with above. Kadi Hession A Chudney Scheffler, MD  

## 2017-07-23 NOTE — Patient Instructions (Signed)
Pre visit review using our clinic review tool, if applicable. No additional management support is needed unless otherwise documented below in the visit note.  Continue to take 1/2 tablet on all days except 1 tablet on Mon.   Re-check in 2 weeks. Dosing instructions given to patient's daughter.   Monitored by [facility] home monitoring service.

## 2017-07-27 ENCOUNTER — Emergency Department (HOSPITAL_COMMUNITY)
Admission: EM | Admit: 2017-07-27 | Discharge: 2017-07-27 | Disposition: A | Discharge status: Home / Self Care | Payer: Medicare Other | Attending: Emergency Medicine | Admitting: Emergency Medicine

## 2017-07-27 ENCOUNTER — Ambulatory Visit (INDEPENDENT_AMBULATORY_CARE_PROVIDER_SITE_OTHER): Payer: Medicare Other | Admitting: Internal Medicine

## 2017-07-27 ENCOUNTER — Encounter (HOSPITAL_COMMUNITY): Payer: Self-pay

## 2017-07-27 ENCOUNTER — Encounter: Payer: Self-pay | Admitting: Internal Medicine

## 2017-07-27 VITALS — BP 110/60 | HR 80 | Temp 97.6°F | Ht 67.0 in | Wt 184.0 lb

## 2017-07-27 DIAGNOSIS — J189 Pneumonia, unspecified organism: Secondary | ICD-10-CM | POA: Diagnosis not present

## 2017-07-27 DIAGNOSIS — J181 Lobar pneumonia, unspecified organism: Secondary | ICD-10-CM

## 2017-07-27 DIAGNOSIS — N183 Chronic kidney disease, stage 3 unspecified: Secondary | ICD-10-CM

## 2017-07-27 DIAGNOSIS — R197 Diarrhea, unspecified: Secondary | ICD-10-CM

## 2017-07-27 DIAGNOSIS — J9611 Chronic respiratory failure with hypoxia: Secondary | ICD-10-CM | POA: Diagnosis not present

## 2017-07-27 DIAGNOSIS — J449 Chronic obstructive pulmonary disease, unspecified: Secondary | ICD-10-CM | POA: Diagnosis not present

## 2017-07-27 DIAGNOSIS — R0902 Hypoxemia: Secondary | ICD-10-CM | POA: Diagnosis not present

## 2017-07-27 DIAGNOSIS — M791 Myalgia, unspecified site: Secondary | ICD-10-CM | POA: Diagnosis not present

## 2017-07-27 DIAGNOSIS — Z79899 Other long term (current) drug therapy: Secondary | ICD-10-CM | POA: Insufficient documentation

## 2017-07-27 DIAGNOSIS — Z87891 Personal history of nicotine dependence: Secondary | ICD-10-CM | POA: Diagnosis not present

## 2017-07-27 DIAGNOSIS — M79604 Pain in right leg: Secondary | ICD-10-CM | POA: Diagnosis present

## 2017-07-27 DIAGNOSIS — J8410 Pulmonary fibrosis, unspecified: Secondary | ICD-10-CM

## 2017-07-27 DIAGNOSIS — Z96651 Presence of right artificial knee joint: Secondary | ICD-10-CM | POA: Diagnosis not present

## 2017-07-27 DIAGNOSIS — I129 Hypertensive chronic kidney disease with stage 1 through stage 4 chronic kidney disease, or unspecified chronic kidney disease: Secondary | ICD-10-CM | POA: Insufficient documentation

## 2017-07-27 DIAGNOSIS — M7981 Nontraumatic hematoma of soft tissue: Secondary | ICD-10-CM | POA: Diagnosis not present

## 2017-07-27 DIAGNOSIS — R233 Spontaneous ecchymoses: Secondary | ICD-10-CM

## 2017-07-27 DIAGNOSIS — I420 Dilated cardiomyopathy: Secondary | ICD-10-CM

## 2017-07-27 DIAGNOSIS — J440 Chronic obstructive pulmonary disease with acute lower respiratory infection: Secondary | ICD-10-CM | POA: Diagnosis not present

## 2017-07-27 LAB — PROTIME-INR
INR: 1.92
PROTHROMBIN TIME: 21.8 s — AB (ref 11.4–15.2)

## 2017-07-27 LAB — CBC
HCT: 41.4 % (ref 39.0–52.0)
Hemoglobin: 13.3 g/dL (ref 13.0–17.0)
MCH: 29.8 pg (ref 26.0–34.0)
MCHC: 32.1 g/dL (ref 30.0–36.0)
MCV: 92.6 fL (ref 78.0–100.0)
PLATELETS: 123 10*3/uL — AB (ref 150–400)
RBC: 4.47 MIL/uL (ref 4.22–5.81)
RDW: 16 % — AB (ref 11.5–15.5)
WBC: 5.6 10*3/uL (ref 4.0–10.5)

## 2017-07-27 NOTE — ED Notes (Signed)
Bed: [facility] Expected date:  Expected time:  Means of arrival:  Comments: EMS 84 yo male hematoma right leg-spontaneous

## 2017-07-27 NOTE — Patient Instructions (Signed)
We have refilled the spiriva to use for breathing.   We are not changing any medicines.   We will get the [facility] stopped and get the old oxygen machine back.

## 2017-07-27 NOTE — ED Notes (Signed)
Pt refused ice to put on leg.

## 2017-07-27 NOTE — ED Triage Notes (Signed)
Patient arrives by EMS with complaints of spontaneous hematoma to RLE while asleep tonight. Ice pack in place and bleeding is controlled. All bleeding under the skin. Patient denies any injury. PMS intact. BP 134/84  O2 sat 94% which is patient's WNL, HR 84 EMS placed marker around initial swelling

## 2017-07-27 NOTE — ED Provider Notes (Signed)
[facility] DEPT Provider Note   CSN: [id number] Arrival date & time: 07/27/17  1943     History   Chief Complaint Chief Complaint  Patient presents with  . Atraumatic hematoma RLE    HPI Ricardo Perkins is a 84 y.o. male presenting with spontaneous hematoma to the anterior right lower extremity.  He reports that he was resting for 30 minutes when he felt a discomfort in lifted his pant leg and noticed ecchymosis and swelling.  He is on Coumadin and has been therapeutic last INR Wednesday.  He denies any trauma or injury. He is otherwise well. Just seen his pcp in clinic earlier today. He was recently admitted for pneumonia.  HPI  Past Medical History:  Diagnosis Date  . Advanced age   . Arthritis    knees, shoulders   . ASTEATOTIC ECZEMA   . BENIGN PROSTATIC HYPERTROPHY, WITH OBSTRUCTION   . BRADYCARDIA    1st degree AVB  . COPD (chronic obstructive pulmonary disease) (HCC)    on chronic oxygen therapy qhs>daytime  . DECREASED HEARING   . Hx of cardiovascular stress test    a.  MV 4/09:  possible small area of lateral ischemia at the apex, but Dr. Kirk Ruths reviewed this, and in fact, felt it was most likely normal.  b.  Myoview 3/13 was again low risk with an EF of 62% with small reversible defect in the apex suggestive of very mild apical ischemia.  Marland Kitchen Hx of echocardiogram    a. echo in 2009 (in [place]): normal EF, mild LVH;   b. echo 10/13:  mid Ant HK, mod LVH, EF 55%, trivial AI, mod LAE, mild RVE, mild reduced RVSF, PASP 46  . HYPERTENSION   . INSOMNIA, PERSISTENT   . Large bilateral inguinal hernias - chronic, containing small bowel (right) and sigmoid colon (left) 01/15/2011   s/p B repair  . LEG EDEMA, BILATERAL   . OA (osteoarthritis) of knee    S/p right TKR March '13 (Dr. Wynelle Link)   . PAROXYSMAL ATRIAL FIBRILLATION    coumadin rx  . RENAL INSUFFICIENCY   . RHINITIS, VASOMOTOR 01/07/2010    Patient Active Problem List   Diagnosis Date Noted  . CAP (community acquired pneumonia) 07/04/2017  . Tachycardia 07/04/2017  . Elevated troponin 07/04/2017  . Diarrhea 07/04/2017  . Long term (current) use of anticoagulants 04/01/2017  . Generalized weakness 03/26/2017  . Recurrent falls 03/26/2017  . Fall   . Encounter for therapeutic drug monitoring 07/16/2016  . Tinnitus 03/13/2016  . Solitary pulmonary nodule 12/20/2015  . Congestive dilated cardiomyopathy ([place]) 11/04/2015  . CKD (chronic kidney disease) stage 3, GFR 30-59 ml/min (HCC) 10/03/2015  . BPH (benign prostatic hyperplasia) 10/03/2015  . PAROXYSMAL ATRIAL FIBRILLATION   . Post herpetic neuralgia 09/01/2015  . Postinflammatory pulmonary fibrosis ([place]) 02/12/2015  . Raynaud phenomenon 06/21/2014  . Essential hypertension 07/23/2008    Past Surgical History:  Procedure Laterality Date  . APPENDECTOMY     @ 23  . EYE SURGERY     bilateral cataract surgery   . HERNIA REPAIR  01/2011   bilateral inguinal hernia   . KNEE SURGERY  right  . OTHER SURGICAL HISTORY     left finger surgery   . TONSILLECTOMY    . TOTAL KNEE ARTHROPLASTY  10/26/2011   Procedure: TOTAL KNEE ARTHROPLASTY;  Surgeon: Gearlean Alf, MD;  Location: [facility];  Service: Orthopedics;  Laterality: Right;  Home Medications    Prior to Admission medications   Medication Sig Start Date End Date Taking? Authorizing Provider  albuterol (PROVENTIL) (2.5 MG/3ML) 0.083% nebulizer solution Take 1 ampule by nebulization every 6 (six) hours as needed for wheezing.    [provider]  amLODipine (NORVASC) 10 MG tablet Take 1 tablet (10 mg total) by mouth daily. 05/07/17   [name], MD  finasteride (PROSCAR) 5 MG tablet TAKE 1 TABLET(5 MG) BY MOUTH DAILY 05/07/17   [name], MD  furosemide (LASIX) 20 MG tablet Take 1 tablet (20 mg total) by mouth daily. 05/07/17   [name], MD  lidocaine (LIDODERM) 5 % Place 1 patch onto the skin  daily. Remove & Discard patch within 12 hours or as directed by MD Patient taking differently: Place 1 patch onto the skin daily as needed (pain). Remove & Discard patch within 12 hours or as directed by MD 09/13/15   [name], MD  metoprolol tartrate (LOPRESSOR) 25 MG tablet Take 0.5 tablets (12.5 mg total) by mouth 2 (two) times daily. 05/07/17   [name], MD  potassium chloride SA (K-DUR,KLOR-CON) 20 MEQ tablet Take 1 tablet (20 mEq total) by mouth daily. 05/07/17   [name], MD  predniSONE (DELTASONE) 10 MG tablet Take 1 tablet (10 mg total) by mouth daily with breakfast. 05/07/17   [name], MD  tamsulosin (FLOMAX) 0.4 MG CAPS capsule Take 1 capsule (0.4 mg total) by mouth daily after supper. 05/07/17   [name], MD  temazepam (RESTORIL) 30 MG capsule TAKE 1 CAPSULE BY MOUTH EVERY DAY AT BEDTIME 07/23/17   [name], MD  tiotropium (SPIRIVA) 18 MCG inhalation capsule Place 1 capsule (18 mcg total) into inhaler and inhale daily. 07/10/17   Oswald Hillock, MD  traMADol (ULTRAM) 50 MG tablet TAKE 1 TABLET BY MOUTH EVERY 12 HOURS AS NEEDED 07/16/17   Binnie Rail, MD  warfarin (COUMADIN) 3 MG tablet AS DIRECTED Patient taking differently: Take 1.5mg  Every day except Monday. On Mondays, take 3mg . 05/07/17   [name], MD    Family History Family History  Problem Relation Age of Onset  . Heart disease Father   . Heart attack Father   . Cancer Mother        colon   . Cirrhosis Sister        liver failure  . Heart attack Brother   . Diabetes Neg Hx     Social History Social History   Tobacco Use  . Smoking status: Former Smoker    Packs/day: 1.00    Years: 30.00    Pack years: 30.00    Types: Cigarettes    Last attempt to quit: 07/20/1998    Years since quitting: 19.0  . Smokeless tobacco: Never Used  Substance Use Topics  . Alcohol use: Yes    Alcohol/week: 0.0 oz    Comment: wine occasional     . Drug use: No     Allergies   Patient has no known allergies.   Review of Systems Review of Systems  Respiratory: Negative for choking, chest tightness, shortness of breath, wheezing and stridor.   Cardiovascular: Positive for leg swelling. Negative for chest pain and palpitations.  Gastrointestinal: Negative for nausea and vomiting.  Musculoskeletal: Positive for myalgias.  Skin: Positive for color change. Negative for pallor and rash.  Neurological: Negative for dizziness, weakness, light-headedness, numbness and headaches.     Physical  Exam Updated Vital Signs BP 124/73 (BP Location: Right Arm)   Pulse 79   Temp 98.8 F (37.1 C) (Oral)   Resp 14   Ht 5\' 7"  (1.702 m)   Wt 83.9 kg (185 lb)   SpO2 91%   BMI 28.98 kg/m   Physical Exam  Constitutional: He appears well-developed and well-nourished. No distress.  Afebrile, nontoxic-appearing, lying comfortably in bed no acute distress.  HENT:  Head: Normocephalic and atraumatic.  Cardiovascular: Normal rate, regular rhythm and intact distal pulses.  Pulmonary/Chest: Effort normal. No respiratory distress.  Musculoskeletal: Normal range of motion. He exhibits tenderness. He exhibits no edema.  Approximately 2 cm hematoma to the right anterior lower extremity.  Patient has full range of motion  Neurological: He is alert. No sensory deficit. He exhibits normal muscle tone.  5/5 strength to lower extremities bilaterally plantar flexion dorsiflexion.  Sensation intact.  Strong dorsalis pedis pulses Neurovascularly intact  Skin: Skin is warm and dry. No rash noted. He is not diaphoretic. No erythema. No pallor.  Psychiatric: He has a normal mood and affect.  Nursing note and vitals reviewed.    ED Treatments / Results  Labs (all labs ordered are listed, but only abnormal results are displayed) Labs Reviewed  PROTIME-INR  CBC    EKG  EKG Interpretation None       Radiology No results  found.  Procedures Procedures (including critical care time)  Medications Ordered in ED Medications - No data to display   Initial Impression / Assessment and Plan / ED Course  I have reviewed the triage vital signs and the nursing notes.  Pertinent labs & imaging results that were available during my care of the patient were reviewed by me and considered in my medical decision making (see chart for details).    Patient presenting with spontaneous hematoma to right lower extremity prior to arrival.  No other complaints or concerns.  Lower extremity with full range of motion no generalized edema or tightness.  Neurovascularly intact.  Pressure dressing and ice applied   Will obtain INR and CBC Patient otherwise well-appearing  INR 1.92 and normal CBC.  Patient was discussed with Dr. Johnney Killian who has seen patient and agrees with assessment and plan.  DC home with close follow-up with PCP. Discussed strict return precautions and advised to return to the emergency department if experiencing any new or worsening symptoms. Instructions were understood and patient agreed with discharge plan. Final Clinical Impressions(s) / ED Diagnoses   Final diagnoses:  Spontaneous hematoma of lower leg    ED Discharge Orders    None       Dossie Der 07/27/17 2245    Charlesetta Shanks, MD 08/02/17 1642

## 2017-07-27 NOTE — Discharge Instructions (Signed)
As discussed, keep compression elevate your lower extremity and apply ice.  Follow-up with your primary care provider.  Return to the emergency department if symptoms worsen, increased swelling, tightness, extreme pain, loss of sensation, pallor or any other new concerning symptoms in the meantime.

## 2017-07-27 NOTE — ED Provider Notes (Signed)
Medical screening examination/treatment/procedure(s) were conducted as a shared visit with non-physician practitioner(s) and myself.  I personally evaluated the patient during the encounter.   EKG Interpretation None        Charlesetta Shanks, MD 08/02/17 1642

## 2017-07-27 NOTE — Progress Notes (Signed)
   Subjective:    Patient ID: Ricardo Perkins, male    DOB: 11/20/1922, 84 y.o.   MRN: [id number]  HPI The patient is a 84 YO man coming in for hospital follow up (he was in for pneumonia and acute on chronic respiratory failure, given antibiotics and steroid dosage increase, overall improving). They are concerned because the new oxygen machine he has at home which can accommodate 6L/minute is very loud and is causing his daughter to be unable to sleep. He is feeling okay but still some tired. Getting around poorly with walker. Denies falls. Appetite is low. He denies chest pains. Some coughing still but improving. Denies fevers or chills. Denies nausea or vomiting. Off antibiotics and now back on usual steroid dosing. They are watching his oxygen at home and he is now using 4-5 L only. His portable oxygen is only able to accommodate 5 maximum but they have it set to 4L/minute today.  His daughter is concerned about what kind of care he wants in the future. She called EMS to take him to the hospital but wants to discuss. He is able to state that he feels like is okay now and he is fine to go to the hospital again if needed for an acute problem. We all agree that he should never be intubated or resuscitated in the case of death.   PMH, [facility], social history reviewed and updated.   Review of Systems  Constitutional: Positive for activity change, appetite change and fatigue. Negative for chills, fever and unexpected weight change.  HENT: Negative for congestion, ear discharge, ear pain, postnasal drip, rhinorrhea, sinus pressure, sinus pain, sneezing, sore throat, tinnitus, trouble swallowing and voice change.   Eyes: Negative.   Respiratory: Positive for cough and shortness of breath. Negative for chest tightness and wheezing.   Cardiovascular: Negative.   Gastrointestinal: Negative.   Musculoskeletal: Positive for arthralgias, gait problem and myalgias.  Skin: Negative.   Neurological: Positive for  weakness. Negative for dizziness and facial asymmetry.  Psychiatric/Behavioral: Negative.       Objective:   Physical Exam  Constitutional: He is oriented to person, place, and time. He appears well-developed and well-nourished.  HENT:  Head: Normocephalic and atraumatic.  Eyes: EOM are normal.  Neck: Normal range of motion.  Cardiovascular: Normal rate.  irreg irreg  Pulmonary/Chest: Effort normal and breath sounds normal. No respiratory distress. He has no wheezes. He has no rales.  4L/minute oxygen, lung exam stable with some rhonchi  Abdominal: Soft. Bowel sounds are normal. He exhibits no distension. There is no tenderness. There is no rebound.  Musculoskeletal: He exhibits no edema.  Neurological: He is alert and oriented to person, place, and time. Coordination abnormal.  Wheeled walker for ambulation  Skin: Skin is warm and dry.  Psychiatric: He has a normal mood and affect.   Vitals:   07/27/17 1407  BP: 110/60  Pulse: 80  Temp: 97.6 F (36.4 C)  TempSrc: Oral  SpO2: 91%  Weight: 184 lb (83.5 kg)  Height: 5\' 7"  (1.702 m)      Assessment & Plan:

## 2017-07-27 NOTE — Progress Notes (Signed)
CSW spoke with pt's daughter. Daughter stated that pt does not need home health. Pt was discharged from [facility] today. Ricardo Perkins was set up for pt after being discharged from the hospital previously.   CSW signing off. No more CSW needs at this time.  Wendelyn Breslow, [facility]  [phone]

## 2017-07-28 ENCOUNTER — Telehealth: Payer: Self-pay | Admitting: Internal Medicine

## 2017-07-28 NOTE — Telephone Encounter (Signed)
Dr. Sharlet Salina,  This was routed to me by mistake.  I think this belongs to you.  Villa Herb, RN

## 2017-07-28 NOTE — Telephone Encounter (Signed)
Copied from [place] [id number]. Topic: Inquiry >> Jul 28, 2017  2:00 PM Scherrie Gerlach wrote: Reason for CRM: daughter states they need the order for his oxygen tank switched out to a 5 liter. She states [facility] was supposed to do this and they have not

## 2017-07-28 NOTE — Telephone Encounter (Signed)
Copied from [place] [id number]. Topic: Quick Communication - See Telephone Encounter >> Jul 28, 2017  2:02 PM Scherrie Gerlach wrote: CRM for notification. See Telephone encounter for: ED visit  07/28/17.  daughter states pt went to the ed last night and had a  Spontaneous hemotomo.  Pt's INR was 1.6 in the ED  Pt is in a lot of pain with this. 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 Pt was just in the office seeing her yesterday.  Woke up later in the evening with this pain and that is when they went to .  Daughter Jocelyn Lamer would also like Villa Herb to know so she can decide if pt coum need to be rechecked soon or med changed.

## 2017-07-29 NOTE — Telephone Encounter (Signed)
Order placed

## 2017-07-30 ENCOUNTER — Encounter: Payer: Self-pay | Admitting: Internal Medicine

## 2017-07-30 ENCOUNTER — Other Ambulatory Visit: Payer: Self-pay | Admitting: Family

## 2017-07-30 ENCOUNTER — Ambulatory Visit (INDEPENDENT_AMBULATORY_CARE_PROVIDER_SITE_OTHER): Payer: Medicare Other | Admitting: Family

## 2017-07-30 ENCOUNTER — Ambulatory Visit (INDEPENDENT_AMBULATORY_CARE_PROVIDER_SITE_OTHER): Payer: Medicare Other | Admitting: General Practice

## 2017-07-30 ENCOUNTER — Encounter: Payer: Self-pay | Admitting: Family

## 2017-07-30 VITALS — BP 124/70 | HR 90 | Temp 97.5°F | Ht 67.0 in | Wt 184.2 lb

## 2017-07-30 DIAGNOSIS — J9611 Chronic respiratory failure with hypoxia: Secondary | ICD-10-CM

## 2017-07-30 DIAGNOSIS — J9610 Chronic respiratory failure, unspecified whether with hypoxia or hypercapnia: Secondary | ICD-10-CM | POA: Insufficient documentation

## 2017-07-30 DIAGNOSIS — Z7901 Long term (current) use of anticoagulants: Secondary | ICD-10-CM | POA: Diagnosis not present

## 2017-07-30 DIAGNOSIS — T148XXA Other injury of unspecified body region, initial encounter: Secondary | ICD-10-CM | POA: Diagnosis not present

## 2017-07-30 LAB — POCT INR: INR: 1.9

## 2017-07-30 NOTE — Assessment & Plan Note (Signed)
Reminded about the importance of taking lasix daily to prevent fluid accumulation.

## 2017-07-30 NOTE — Progress Notes (Signed)
Medical treatment/procedure(s) were performed by non-physician practitioner and as supervising physician I was immediately available for consultation/collaboration. I agree with above. Zamarah Ullmer A Coleman Kalas, MD  

## 2017-07-30 NOTE — Patient Instructions (Addendum)
Pre visit review using our clinic review tool, if applicable. No additional management support is needed unless otherwise documented below in the visit note.  Add additional 1/2 tablet today if OK with Jodi Mourning and then continue to take 1/2 tablet on all days except 1 tablet on Mon.   Re-check in 1 week.Marland Kitchen Dosing instructions given to patient's daughter.   Monitored by [facility] home monitoring service.  Patient in office today.

## 2017-07-30 NOTE — Assessment & Plan Note (Signed)
Will reorder oxygen for maximum 5L/minute. He is doing well at rest on 4L and can use up to 5L/minute at exertion.

## 2017-07-30 NOTE — Assessment & Plan Note (Signed)
Cr stable during hospitalization.

## 2017-07-30 NOTE — Assessment & Plan Note (Signed)
Continue with prednisone 10 mg daily. They continue to refuse to return to pulmonary despite request.

## 2017-07-30 NOTE — Patient Instructions (Signed)

## 2017-07-30 NOTE — Assessment & Plan Note (Signed)
He is recovering well and back on usual steroid dosing. Coughing is decreasing and SOB is stable.

## 2017-07-30 NOTE — Assessment & Plan Note (Signed)
Resolving and encouraged probiotic usage to help restore GI flora.

## 2017-07-31 NOTE — Progress Notes (Signed)
Ricardo Perkins is a 84 y.o. male with the following history as recorded in EpicCare:  Patient Active Problem List   Diagnosis Date Noted  . Chronic respiratory failure ([place]) 07/30/2017  . CAP (community acquired pneumonia) 07/04/2017  . Diarrhea 07/04/2017  . Generalized weakness 03/26/2017  . Recurrent falls 03/26/2017  . Solitary pulmonary nodule 12/20/2015  . Congestive dilated cardiomyopathy ([place]) 11/04/2015  . CKD (chronic kidney disease) stage 3, GFR 30-59 ml/min (HCC) 10/03/2015  . BPH (benign prostatic hyperplasia) 10/03/2015  . PAROXYSMAL ATRIAL FIBRILLATION   . Post herpetic neuralgia 09/01/2015  . Postinflammatory pulmonary fibrosis ([place]) 02/12/2015  . Raynaud phenomenon 06/21/2014  . Essential hypertension 07/23/2008    Current Outpatient Medications  Medication Sig Dispense Refill  . albuterol (PROVENTIL) (2.5 MG/3ML) 0.083% nebulizer solution Take 1 ampule by nebulization every 6 (six) hours as needed for wheezing.    Marland Kitchen amLODipine (NORVASC) 10 MG tablet Take 1 tablet (10 mg total) by mouth daily. 90 tablet 3  . finasteride (PROSCAR) 5 MG tablet TAKE 1 TABLET(5 MG) BY MOUTH DAILY 90 tablet 3  . furosemide (LASIX) 20 MG tablet Take 1 tablet (20 mg total) by mouth daily. 90 tablet 3  . lidocaine (LIDODERM) 5 % Place 1 patch onto the skin daily. Remove & Discard patch within 12 hours or as directed by MD (Patient taking differently: Place 1 patch onto the skin daily as needed (pain). Remove & Discard patch within 12 hours or as directed by MD) 60 patch 6  . metoprolol tartrate (LOPRESSOR) 25 MG tablet Take 0.5 tablets (12.5 mg total) by mouth 2 (two) times daily. 90 tablet 3  . potassium chloride SA (K-DUR,KLOR-CON) 20 MEQ tablet Take 1 tablet (20 mEq total) by mouth daily. 90 tablet 3  . predniSONE (DELTASONE) 10 MG tablet Take 1 tablet (10 mg total) by mouth daily with breakfast. 90 tablet 1  . tamsulosin (FLOMAX) 0.4 MG CAPS capsule Take 1 capsule (0.4 mg total) by mouth  daily after supper. 90 capsule 3  . temazepam (RESTORIL) 30 MG capsule TAKE 1 CAPSULE BY MOUTH EVERY DAY AT BEDTIME 90 capsule 0  . tiotropium (SPIRIVA) 18 MCG inhalation capsule Place 1 capsule (18 mcg total) into inhaler and inhale daily. 30 capsule 12  . traMADol (ULTRAM) 50 MG tablet TAKE 1 TABLET BY MOUTH EVERY 12 HOURS AS NEEDED (Patient taking differently: TAKE 1 TABLET BY MOUTH EVERY 12 HOURS AS NEEDED for pain) 60 tablet 0  . warfarin (COUMADIN) 3 MG tablet AS DIRECTED (Patient taking differently: Take 1.5mg  Every day except Monday. On Mondays, take 3mg .) 90 tablet 1   No current facility-administered medications for this visit.     Allergies: Patient has no known allergies.  Past Medical History:  Diagnosis Date  . Advanced age   . Arthritis    knees, shoulders   . ASTEATOTIC ECZEMA   . BENIGN PROSTATIC HYPERTROPHY, WITH OBSTRUCTION   . BRADYCARDIA    1st degree AVB  . COPD (chronic obstructive pulmonary disease) (HCC)    on chronic oxygen therapy qhs>daytime  . DECREASED HEARING   . Hx of cardiovascular stress test    a.  MV 4/09:  possible small area of lateral ischemia at the apex, but Dr. Kirk Ruths reviewed this, and in fact, felt it was most likely normal.  b.  Myoview 3/13 was again low risk with an EF of 62% with small reversible defect in the apex suggestive of very mild apical ischemia.  Marland Kitchen Hx  of echocardiogram    a. echo in 2009 (in [place]): normal EF, mild LVH;   b. echo 10/13:  mid Ant HK, mod LVH, EF 55%, trivial AI, mod LAE, mild RVE, mild reduced RVSF, PASP 46  . HYPERTENSION   . INSOMNIA, PERSISTENT   . Large bilateral inguinal hernias - chronic, containing small bowel (right) and sigmoid colon (left) 01/15/2011   s/p B repair  . LEG EDEMA, BILATERAL   . OA (osteoarthritis) of knee    S/p right TKR March '13 (Dr. Wynelle Link)   . PAROXYSMAL ATRIAL FIBRILLATION    coumadin rx  . RENAL INSUFFICIENCY   . RHINITIS, VASOMOTOR 01/07/2010    Past Surgical  History:  Procedure Laterality Date  . APPENDECTOMY     @ 23  . EYE SURGERY     bilateral cataract surgery   . HERNIA REPAIR  01/2011   bilateral inguinal hernia   . KNEE SURGERY  right  . OTHER SURGICAL HISTORY     left finger surgery   . TONSILLECTOMY    . TOTAL KNEE ARTHROPLASTY  10/26/2011   Procedure: TOTAL KNEE ARTHROPLASTY;  Surgeon: Gearlean Alf, MD;  Location: [facility];  Service: Orthopedics;  Laterality: Right;    Family History  Problem Relation Age of Onset  . Heart disease Father   . Heart attack Father   . Cancer Mother        colon   . Cirrhosis Sister        liver failure  . Heart attack Brother   . Diabetes Neg Hx     Social History   Tobacco Use  . Smoking status: Former Smoker    Packs/day: 1.00    Years: 30.00    Pack years: 30.00    Types: Cigarettes    Last attempt to quit: 07/20/1998    Years since quitting: 19.0  . Smokeless tobacco: Never Used  Substance Use Topics  . Alcohol use: Yes    Alcohol/week: 0.0 oz    Comment: wine occasional     Subjective:  Patient is seen at the request of his daughter; did not have an appointment and she brought him to the office asking for urgent repeat evaluation; Patient was seen at ER on 1/8 with sudden onset of hematoma left shin; INR was actually sub-therapeutic at that visit; INR has been checked at office today and is sub-therapeutic again;  per daughter, no imaging was done at ER and she is very angry/ frustrated about the care they received at ER; patient notes that his leg is feeling much better today than the previous day- denies any pain; daughter admits this is not what she had understood her dad to say about the leg; patient is adamant and repeatedly notes that his leg "has not hurt at all today." Daughter notes they have been applying ice to the hematoma;  Daughter is also asking the order for her father's new O2 machine be manually faxed to Home health agency; she spends half of the visit talking on the  phone to home health agency'    Objective:  Vitals:   07/30/17 1507  BP: 124/70  Pulse: 90  Temp: (!) 97.5 F (36.4 C)  TempSrc: Oral  SpO2: (!) 89%  Weight: 184 lb 4 oz (83.6 kg)  Height: 5\' 7"  (1.702 m)    General: Well developed, well nourished, in no acute distress  Skin : Warm and dry. Hematoma noted on left shin; soft, movable; no warmth noted;  no redness/ swelling noted over calf;  Head: Normocephalic and atraumatic  Neurologic: Alert and oriented; speech intact; face symmetrical; moves all extremities well; CNII-XII intact without focal deficit  Assessment:  1. Hematoma   2. Chronic respiratory failure with hypoxia (HCC)     Plan:  1. Daughter is comfortable when her father repeatedly notes his leg does not hurt; reassurance that very low suspicion for any type of compartment syndrome; recommend to try applying some heat to see if this helps resorption; reassurance that may take a number of weeks for hematoma to resolve completely. 2. Order for O2 is manually faxed to [facility] as requested by daughter.   No Follow-up on file.  No orders of the defined types were placed in this encounter.   Requested Prescriptions    No prescriptions requested or ordered in this encounter

## 2017-08-02 DIAGNOSIS — J189 Pneumonia, unspecified organism: Secondary | ICD-10-CM | POA: Diagnosis not present

## 2017-08-02 DIAGNOSIS — J440 Chronic obstructive pulmonary disease with acute lower respiratory infection: Secondary | ICD-10-CM | POA: Diagnosis not present

## 2017-08-04 LAB — POCT INR: INR: 2.3

## 2017-08-05 DIAGNOSIS — J440 Chronic obstructive pulmonary disease with acute lower respiratory infection: Secondary | ICD-10-CM | POA: Diagnosis not present

## 2017-08-05 DIAGNOSIS — J189 Pneumonia, unspecified organism: Secondary | ICD-10-CM | POA: Diagnosis not present

## 2017-08-06 ENCOUNTER — Ambulatory Visit (INDEPENDENT_AMBULATORY_CARE_PROVIDER_SITE_OTHER): Payer: Medicare Other | Admitting: General Practice

## 2017-08-06 DIAGNOSIS — Z7901 Long term (current) use of anticoagulants: Secondary | ICD-10-CM | POA: Diagnosis not present

## 2017-08-06 NOTE — Patient Instructions (Signed)
Pre visit review using our clinic review tool, if applicable. No additional management support is needed unless otherwise documented below in the visit note.  Continue to take 1/2 tablet on all days except 1 tablet on Mon.   Re-check in 1 week.Marland Kitchen Dosing instructions given to patient's daughter.   Monitored by [facility] home monitoring service.  Patient in office today.

## 2017-08-06 NOTE — Progress Notes (Signed)
Medical treatment/procedure(s) were performed by non-physician practitioner and as supervising physician I was immediately available for consultation/collaboration. I agree with above. Ramon Zanders A Kayton Ripp, MD  

## 2017-08-09 DIAGNOSIS — J189 Pneumonia, unspecified organism: Secondary | ICD-10-CM | POA: Diagnosis not present

## 2017-08-09 DIAGNOSIS — J440 Chronic obstructive pulmonary disease with acute lower respiratory infection: Secondary | ICD-10-CM | POA: Diagnosis not present

## 2017-08-10 DIAGNOSIS — H35372 Puckering of macula, left eye: Secondary | ICD-10-CM | POA: Diagnosis not present

## 2017-08-10 DIAGNOSIS — Z961 Presence of intraocular lens: Secondary | ICD-10-CM | POA: Diagnosis not present

## 2017-08-10 DIAGNOSIS — H52203 Unspecified astigmatism, bilateral: Secondary | ICD-10-CM | POA: Diagnosis not present

## 2017-08-19 DIAGNOSIS — I480 Paroxysmal atrial fibrillation: Secondary | ICD-10-CM | POA: Diagnosis not present

## 2017-08-19 LAB — POCT INR: INR: 3.4

## 2017-08-20 ENCOUNTER — Ambulatory Visit (INDEPENDENT_AMBULATORY_CARE_PROVIDER_SITE_OTHER): Payer: Medicare Other | Admitting: General Practice

## 2017-08-20 DIAGNOSIS — I4891 Unspecified atrial fibrillation: Secondary | ICD-10-CM

## 2017-08-20 NOTE — Patient Instructions (Addendum)
Pre visit review using our clinic review tool, if applicable. No additional management support is needed unless otherwise documented below in the visit note.  Skip coumadin today and then continue to take 1/2 tablet on all days except 1 tablet on Mon.   Re-check in 1 week.Marland Kitchen Dosing instructions given to patient.  Monitored by [facility] home monitoring service.

## 2017-08-20 NOTE — Progress Notes (Signed)
Medical treatment/procedure(s) were performed by non-physician practitioner and as supervising physician I was immediately available for consultation/collaboration. I agree with above. Amberia Bayless A Mckaylee Dimalanta, MD  

## 2017-08-27 LAB — POCT INR: INR: 2.3

## 2017-08-28 ENCOUNTER — Other Ambulatory Visit: Payer: Self-pay | Admitting: Internal Medicine

## 2017-08-30 NOTE — Telephone Encounter (Signed)
Control database checked last refill: 07/23/2017 LOV: 07/27/2017

## 2017-08-31 ENCOUNTER — Ambulatory Visit (INDEPENDENT_AMBULATORY_CARE_PROVIDER_SITE_OTHER): Payer: Medicare Other | Admitting: General Practice

## 2017-08-31 DIAGNOSIS — I4891 Unspecified atrial fibrillation: Secondary | ICD-10-CM | POA: Diagnosis not present

## 2017-08-31 NOTE — Patient Instructions (Addendum)
Pre visit review using our clinic review tool, if applicable. No additional management support is needed unless otherwise documented below in the visit note.  Continue to take 1/2 tablet on all days except 1 tablet on Mon.   Re-check in 1 week.Marland Kitchen Dosing instructions given to patient.  Monitored by [employer]

## 2017-08-31 NOTE — Progress Notes (Signed)
Medical treatment/procedure(s) were performed by non-physician practitioner and as supervising physician I was immediately available for consultation/collaboration. I agree with above. Elizabeth A Crawford, MD  

## 2017-09-08 LAB — POCT INR: INR: 3

## 2017-09-10 ENCOUNTER — Ambulatory Visit (INDEPENDENT_AMBULATORY_CARE_PROVIDER_SITE_OTHER): Payer: Medicare Other | Admitting: General Practice

## 2017-09-10 DIAGNOSIS — I4891 Unspecified atrial fibrillation: Secondary | ICD-10-CM

## 2017-09-10 NOTE — Progress Notes (Signed)
Medical treatment/procedure(s) were performed by non-physician practitioner and as supervising physician I was immediately available for consultation/collaboration. I agree with above. Elizabeth A Crawford, MD  

## 2017-09-10 NOTE — Patient Instructions (Signed)
Pre visit review using our clinic review tool, if applicable. No additional management support is needed unless otherwise documented below in the visit note.  Continue to take 1/2 tablet on all days except 1 tablet on Mon.   Re-check in 1 week. Dosing instructions given to patient.  Monitored by [employer]

## 2017-09-28 ENCOUNTER — Ambulatory Visit (INDEPENDENT_AMBULATORY_CARE_PROVIDER_SITE_OTHER): Payer: Medicare Other | Admitting: General Practice

## 2017-09-28 DIAGNOSIS — I4891 Unspecified atrial fibrillation: Secondary | ICD-10-CM

## 2017-09-28 DIAGNOSIS — I480 Paroxysmal atrial fibrillation: Secondary | ICD-10-CM | POA: Diagnosis not present

## 2017-09-28 LAB — POCT INR: INR: 2.4

## 2017-09-28 NOTE — Progress Notes (Signed)
Medical treatment/procedure(s) were performed by non-physician practitioner and as supervising physician I was immediately available for consultation/collaboration. I agree with above. Elizabeth A Crawford, MD  

## 2017-09-28 NOTE — Patient Instructions (Addendum)
Pre visit review using our clinic review tool, if applicable. No additional management support is needed unless otherwise documented below in the visit note.  Continue to take 1/2 tablet on all days except 1 tablet on Mon.   Re-check in 1 week. Dosing instructions given to patient.  Monitored by [employer]

## 2017-10-04 ENCOUNTER — Emergency Department (HOSPITAL_BASED_OUTPATIENT_CLINIC_OR_DEPARTMENT_OTHER)
Admission: EM | Admit: 2017-10-04 | Discharge: 2017-10-04 | Disposition: A | Discharge status: Home / Self Care | Payer: Medicare Other | Attending: Emergency Medicine | Admitting: Emergency Medicine

## 2017-10-04 ENCOUNTER — Other Ambulatory Visit: Payer: Self-pay

## 2017-10-04 ENCOUNTER — Emergency Department (HOSPITAL_BASED_OUTPATIENT_CLINIC_OR_DEPARTMENT_OTHER): Payer: Medicare Other

## 2017-10-04 ENCOUNTER — Encounter (HOSPITAL_BASED_OUTPATIENT_CLINIC_OR_DEPARTMENT_OTHER): Payer: Self-pay

## 2017-10-04 DIAGNOSIS — Y998 Other external cause status: Secondary | ICD-10-CM | POA: Diagnosis not present

## 2017-10-04 DIAGNOSIS — Z23 Encounter for immunization: Secondary | ICD-10-CM | POA: Insufficient documentation

## 2017-10-04 DIAGNOSIS — R059 Cough, unspecified: Secondary | ICD-10-CM

## 2017-10-04 DIAGNOSIS — W010XXA Fall on same level from slipping, tripping and stumbling without subsequent striking against object, initial encounter: Secondary | ICD-10-CM | POA: Diagnosis not present

## 2017-10-04 DIAGNOSIS — S61011A Laceration without foreign body of right thumb without damage to nail, initial encounter: Secondary | ICD-10-CM | POA: Diagnosis not present

## 2017-10-04 DIAGNOSIS — Y929 Unspecified place or not applicable: Secondary | ICD-10-CM | POA: Diagnosis not present

## 2017-10-04 DIAGNOSIS — I129 Hypertensive chronic kidney disease with stage 1 through stage 4 chronic kidney disease, or unspecified chronic kidney disease: Secondary | ICD-10-CM | POA: Insufficient documentation

## 2017-10-04 DIAGNOSIS — R55 Syncope and collapse: Secondary | ICD-10-CM | POA: Insufficient documentation

## 2017-10-04 DIAGNOSIS — Y93K1 Activity, walking an animal: Secondary | ICD-10-CM | POA: Diagnosis not present

## 2017-10-04 DIAGNOSIS — S7011XA Contusion of right thigh, initial encounter: Secondary | ICD-10-CM | POA: Insufficient documentation

## 2017-10-04 DIAGNOSIS — R079 Chest pain, unspecified: Secondary | ICD-10-CM | POA: Diagnosis not present

## 2017-10-04 DIAGNOSIS — N183 Chronic kidney disease, stage 3 (moderate): Secondary | ICD-10-CM | POA: Diagnosis not present

## 2017-10-04 DIAGNOSIS — R600 Localized edema: Secondary | ICD-10-CM | POA: Insufficient documentation

## 2017-10-04 DIAGNOSIS — Z87891 Personal history of nicotine dependence: Secondary | ICD-10-CM | POA: Diagnosis not present

## 2017-10-04 DIAGNOSIS — R05 Cough: Secondary | ICD-10-CM | POA: Diagnosis not present

## 2017-10-04 LAB — BASIC METABOLIC PANEL
ANION GAP: 11 (ref 5–15)
BUN: 28 mg/dL — ABNORMAL HIGH (ref 6–20)
CALCIUM: 9.3 mg/dL (ref 8.9–10.3)
CO2: 23 mmol/L (ref 22–32)
CREATININE: 1.53 mg/dL — AB (ref 0.61–1.24)
Chloride: 105 mmol/L (ref 101–111)
GFR calc non Af Amer: 37 mL/min — ABNORMAL LOW (ref >60)
GFR, EST AFRICAN AMERICAN: 43 mL/min — AB (ref >60)
Glucose, Bld: 126 mg/dL — ABNORMAL HIGH (ref 65–99)
Potassium: 4.3 mmol/L (ref 3.5–5.1)
SODIUM: 139 mmol/L (ref 135–145)

## 2017-10-04 LAB — CBC WITH DIFFERENTIAL/PLATELET
BASOS ABS: 0 10*3/uL (ref 0.0–0.1)
Basophils Relative: 0 %
EOS PCT: 0 %
Eosinophils Absolute: 0 10*3/uL (ref 0.0–0.7)
HEMATOCRIT: 45.6 % (ref 39.0–52.0)
Hemoglobin: 14.8 g/dL (ref 13.0–17.0)
LYMPHS ABS: 1.1 10*3/uL (ref 0.7–4.0)
Lymphocytes Relative: 14 %
MCH: 30.3 pg (ref 26.0–34.0)
MCHC: 32.5 g/dL (ref 30.0–36.0)
MCV: 93.3 fL (ref 78.0–100.0)
MONO ABS: 0.2 10*3/uL (ref 0.1–1.0)
MONOS PCT: 3 %
NEUTROS PCT: 83 %
Neutro Abs: 6.4 10*3/uL (ref 1.7–7.7)
PLATELETS: 223 10*3/uL (ref 150–400)
RBC: 4.89 MIL/uL (ref 4.22–5.81)
RDW: 16 % — AB (ref 11.5–15.5)
WBC: 7.7 10*3/uL (ref 4.0–10.5)

## 2017-10-04 LAB — PROTIME-INR
INR: 2.6
Prothrombin Time: 27.6 seconds — ABNORMAL HIGH (ref 11.4–15.2)

## 2017-10-04 LAB — BRAIN NATRIURETIC PEPTIDE: B NATRIURETIC PEPTIDE 5: 92.1 pg/mL (ref 0.0–100.0)

## 2017-10-04 LAB — TROPONIN I: Troponin I: 0.03 ng/mL (ref <0.03)

## 2017-10-04 LAB — OCCULT BLOOD X 1 CARD TO LAB, STOOL: FECAL OCCULT BLD: NEGATIVE

## 2017-10-04 MED ORDER — TETANUS-DIPHTH-ACELL PERTUSSIS 5-2.5-18.5 LF-MCG/0.5 IM SUSP
0.5000 mL | Freq: Once | INTRAMUSCULAR | Status: AC
  Administered 2017-10-04: 0.5 mL via INTRAMUSCULAR
  Filled 2017-10-04: qty 0.5

## 2017-10-04 NOTE — ED Triage Notes (Addendum)
Per daughter pt with SOB, swelling to LE x 2 days-pt on home O2 4L [place]-O2 sat 88 on 4L [place] in ED WR-pt taken to tx area #14-daughter states pt also passed out 3/13-cut right thumb-she was out of town and made aware upon her arrival home

## 2017-10-04 NOTE — ED Notes (Signed)
Pt and daughter verbalize understanding of dc instructions and denies any further needs at this time

## 2017-10-04 NOTE — ED Provider Notes (Signed)
[place] [place] [facility] EMERGENCY DEPARTMENT Provider Note   CSN: [id number] Arrival date & time: 10/04/17  1747     History   Chief Complaint Chief Complaint  Patient presents with  . Shortness of Breath    HPI  Ricardo Perkins is a 84 y.o. male.  Wanes of cough productive of white sputum for the past 5 days.  His daughter reports progressively worsening leg edema, bilateral for the past 5 days.  No known fever.  No treatment prior to coming here.  Patient also reports that he fainted 5 days ago when he bent over to put on the dog's leash he suffered a laceration to his right thumb and a bruise to his right thigh as a result of the fall.  Denies pain anywhere presently.  Is been amatory since the event he denies shortness of breath presently.  No other associated symptoms.  No treatment prior to coming here patient reports that he feels better than he did yesterday.  HPI  Past Medical History:  Diagnosis Date  . Advanced age   . Arthritis    knees, shoulders   . ASTEATOTIC ECZEMA   . BENIGN PROSTATIC HYPERTROPHY, WITH OBSTRUCTION   . BRADYCARDIA    1st degree AVB  . COPD (chronic obstructive pulmonary disease) (HCC)    on chronic oxygen therapy qhs>daytime  . DECREASED HEARING   . Hx of cardiovascular stress test    a.  MV 4/09:  possible small area of lateral ischemia at the apex, but Dr. Kirk Ruths reviewed this, and in fact, felt it was most likely normal.  b.  Myoview 3/13 was again low risk with an EF of 62% with small reversible defect in the apex suggestive of very mild apical ischemia.  Marland Kitchen Hx of echocardiogram    a. echo in 2009 (in [place]): normal EF, mild LVH;   b. echo 10/13:  mid Ant HK, mod LVH, EF 55%, trivial AI, mod LAE, mild RVE, mild reduced RVSF, PASP 46  . HYPERTENSION   . INSOMNIA, PERSISTENT   . Large bilateral inguinal hernias - chronic, containing small bowel (right) and sigmoid colon (left) 01/15/2011   s/p B repair  . LEG EDEMA, BILATERAL   . OA  (osteoarthritis) of knee    S/p right TKR March '13 (Dr. Wynelle Link)   . PAROXYSMAL ATRIAL FIBRILLATION    coumadin rx  . RENAL INSUFFICIENCY   . RHINITIS, VASOMOTOR 01/07/2010    Patient Active Problem List   Diagnosis Date Noted  . Chronic respiratory failure ([place]) 07/30/2017  . CAP (community acquired pneumonia) 07/04/2017  . Diarrhea 07/04/2017  . Generalized weakness 03/26/2017  . Recurrent falls 03/26/2017  . Solitary pulmonary nodule 12/20/2015  . Congestive dilated cardiomyopathy ([place]) 11/04/2015  . CKD (chronic kidney disease) stage 3, GFR 30-59 ml/min (HCC) 10/03/2015  . BPH (benign prostatic hyperplasia) 10/03/2015  . PAROXYSMAL ATRIAL FIBRILLATION   . Post herpetic neuralgia 09/01/2015  . Postinflammatory pulmonary fibrosis ([place]) 02/12/2015  . Raynaud phenomenon 06/21/2014  . Essential hypertension 07/23/2008    Past Surgical History:  Procedure Laterality Date  . APPENDECTOMY     @ 23  . EYE SURGERY     bilateral cataract surgery   . HERNIA REPAIR  01/2011   bilateral inguinal hernia   . KNEE SURGERY  right  . OTHER SURGICAL HISTORY     left finger surgery   . TONSILLECTOMY    . TOTAL KNEE ARTHROPLASTY  10/26/2011   Procedure: TOTAL KNEE  ARTHROPLASTY;  Surgeon: Gearlean Alf, MD;  Location: [facility];  Service: Orthopedics;  Laterality: Right;       Home Medications    Prior to Admission medications   Medication Sig Start Date End Date Taking? Authorizing Provider  albuterol (PROVENTIL) (2.5 MG/3ML) 0.083% nebulizer solution Take 1 ampule by nebulization every 6 (six) hours as needed for wheezing.    [provider]  amLODipine (NORVASC) 10 MG tablet Take 1 tablet (10 mg total) by mouth daily. 05/07/17   [name], MD  finasteride (PROSCAR) 5 MG tablet TAKE 1 TABLET(5 MG) BY MOUTH DAILY 05/07/17   [name], MD  furosemide (LASIX) 20 MG tablet Take 1 tablet (20 mg total) by mouth daily. 05/07/17   [name], MD    lidocaine (LIDODERM) 5 % Place 1 patch onto the skin daily. Remove & Discard patch within 12 hours or as directed by MD Patient taking differently: Place 1 patch onto the skin daily as needed (pain). Remove & Discard patch within 12 hours or as directed by MD 09/13/15   [name], MD  metoprolol tartrate (LOPRESSOR) 25 MG tablet Take 0.5 tablets (12.5 mg total) by mouth 2 (two) times daily. 05/07/17   [name], MD  potassium chloride SA (K-DUR,KLOR-CON) 20 MEQ tablet Take 1 tablet (20 mEq total) by mouth daily. 05/07/17   [name], MD  predniSONE (DELTASONE) 10 MG tablet Take 1 tablet (10 mg total) by mouth daily with breakfast. 05/07/17   [name], MD  tamsulosin (FLOMAX) 0.4 MG CAPS capsule Take 1 capsule (0.4 mg total) by mouth daily after supper. 05/07/17   [name], MD  temazepam (RESTORIL) 30 MG capsule TAKE 1 CAPSULE BY MOUTH EVERY DAY AT BEDTIME 07/23/17   [name], MD  tiotropium (SPIRIVA) 18 MCG inhalation capsule Place 1 capsule (18 mcg total) into inhaler and inhale daily. 07/10/17   Oswald Hillock, MD  traMADol (ULTRAM) 50 MG tablet TAKE 1 TABLET BY MOUTH EVERY 12 HOURS AS NEEDED 08/30/17   [name], MD  warfarin (COUMADIN) 3 MG tablet AS DIRECTED Patient taking differently: Take 1.5mg  Every day except Monday. On Mondays, take 3mg . 05/07/17   [name], MD    Family History Family History  Problem Relation Age of Onset  . Heart disease Father   . Heart attack Father   . Cancer Mother        colon   . Cirrhosis Sister        liver failure  . Heart attack Brother   . Diabetes Neg Hx     Social History Social History   Tobacco Use  . Smoking status: Former Smoker    Packs/day: 1.00    Years: 30.00    Pack years: 30.00    Types: Cigarettes    Last attempt to quit: 07/20/1998    Years since quitting: 19.2  . Smokeless tobacco: Never Used  Substance Use Topics  . Alcohol use:  Yes    Alcohol/week: 0.0 oz    Comment: wine occasional   . Drug use: No     Allergies   Patient has no known allergies.   Review of Systems Review of Systems  HENT: Positive for hearing loss.        Chronically hard of hearing  Respiratory: Positive for cough and shortness of breath.   Skin: Positive for wound.       Laceration of right  thumb     Physical Exam Updated Vital Signs BP (!) 143/85 (BP Location: Left Arm)   Pulse 72   Temp 97.8 F (36.6 C) (Oral)   Resp (!) 22   SpO2 92%   Physical Exam  Constitutional: He appears well-developed and well-nourished.  HENT:  Head: Normocephalic and atraumatic.  Eyes: Conjunctivae are normal. Pupils are equal, round, and reactive to light.  Neck: Neck supple. No tracheal deviation present. No thyromegaly present.  Cardiovascular: Normal rate and regular rhythm.  No murmur heard. Pulmonary/Chest: Effort normal and breath sounds normal.  Obese  Abdominal: Soft. Bowel sounds are normal. He exhibits no distension. There is no tenderness.  Genitourinary:  Genitourinary Comments: Rectum normal tone brown stool no gross blood  Musculoskeletal: Normal range of motion. He exhibits edema. He exhibits no tenderness.  Lower extremity no deformity no there is an oblong shaped approximate 4 cm x 10 cm reddish purple ecchymosis at the lateral aspect of the thigh without swelling deformity or tenderness.  DP pulse 2+.  Right upper extremity there is a healing laceration at the distal phalanx, radial aspect of the thumb.  No surrounding redness swelling or tenderness.  Full range of motion.  All other extremities or contusion abrasion or tenderness neurovascular intact bilateral lower extremities with 1+ pretibial pitting edema  Neurological: He is alert. Coordination normal.  Skin: Skin is warm and dry. Capillary refill takes less than 2 seconds. No rash noted.  Psychiatric: He has a normal mood and affect.  Nursing note and vitals  reviewed.  Chest x-ray viewed by me Results for orders placed or performed during the hospital encounter of 10/04/17  Protime-INR  Result Value Ref Range   Prothrombin Time 27.6 (H) 11.4 - 15.2 seconds   INR 2.60   CBC with Differential/Platelet  Result Value Ref Range   WBC 7.7 4.0 - 10.5 K/uL   RBC 4.89 4.22 - 5.81 MIL/uL   Hemoglobin 14.8 13.0 - 17.0 g/dL   HCT 45.6 39.0 - 52.0 %   MCV 93.3 78.0 - 100.0 fL   MCH 30.3 26.0 - 34.0 pg   MCHC 32.5 30.0 - 36.0 g/dL   RDW 16.0 (H) 11.5 - 15.5 %   Platelets 223 150 - 400 K/uL   Neutrophils Relative % 83 %   Lymphocytes Relative 14 %   Monocytes Relative 3 %   Eosinophils Relative 0 %   Basophils Relative 0 %   Neutro Abs 6.4 1.7 - 7.7 K/uL   Lymphs Abs 1.1 0.7 - 4.0 K/uL   Monocytes Absolute 0.2 0.1 - 1.0 K/uL   Eosinophils Absolute 0.0 0.0 - 0.7 K/uL   Basophils Absolute 0.0 0.0 - 0.1 K/uL   WBC Morphology MILD LEFT SHIFT (1-5% METAS, OCC MYELO, OCC BANDS)   Troponin I  Result Value Ref Range   Troponin I 0.03 (HH) <0.03 ng/mL  Basic metabolic panel  Result Value Ref Range   Sodium 139 135 - 145 mmol/L   Potassium 4.3 3.5 - 5.1 mmol/L   Chloride 105 101 - 111 mmol/L   CO2 23 22 - 32 mmol/L   Glucose, Bld 126 (H) 65 - 99 mg/dL   BUN 28 (H) 6 - 20 mg/dL   Creatinine, Ser 1.53 (H) 0.61 - 1.24 mg/dL   Calcium 9.3 8.9 - 10.3 mg/dL   GFR calc non Af Amer 37 (L) >60 mL/min   GFR calc Af Amer 43 (L) >60 mL/min   Anion gap 11 5 -  15  Brain natriuretic peptide  Result Value Ref Range   B Natriuretic Peptide 92.1 0.0 - 100.0 pg/mL  Occult blood card to lab, stool Provider will collect  Result Value Ref Range   Fecal Occult Bld NEGATIVE NEGATIVE   Dg Chest 2 View  Result Date: 10/04/2017 CLINICAL DATA:  Sternal chest pain EXAM: CHEST - 2 VIEW COMPARISON:  07/04/2017, CT chest 07/26/2015 FINDINGS: Stable scarring at the right greater than left lung bases. No consolidation or pleural effusion. Vague nodular opacity at the  peripheral right lung base. Stable cardiomediastinal silhouette with borderline heart size. Aortic atherosclerosis. No pneumothorax. IMPRESSION: 1. Similar appearance of bibasilar scarring. No acute pulmonary infiltrate 2. Vague nodular opacity at the right peripheral base, may correspond to a known lung nodule in the region; follow-up chest CT recommended to assess for possible interval growth Electronically Signed   By: Donavan Foil M.D.   On: 10/04/2017 19:15    ED Treatments / Results  Labs (all labs ordered are listed, but only abnormal results are displayed) Labs Reviewed  PROTIME-INR  CBC WITH DIFFERENTIAL/PLATELET  TROPONIN I  BASIC METABOLIC PANEL  CBC WITH DIFFERENTIAL/PLATELET  POC OCCULT BLOOD, ED    EKG  EKG Interpretation  Date/Time:  Monday October 04 2017 18:04:03 EDT Ventricular Rate:  74 PR Interval:    QRS Duration: 99 QT Interval:  398 QTC Calculation: 442 R Axis:   -17 Text Interpretation:  Sinus rhythm Atrial premature complex Prolonged PR interval Anterolateral infarct, old No significant change since last tracing Confirmed by Orlie Dakin [phone]) on 10/04/2017 6:08:43 PM       Radiology No results found.  Procedures Procedures (including critical care time)  Medications Ordered in ED Medications  Tdap (BOOSTRIX) injection 0.5 mL (not administered)     Initial Impression / Assessment and Plan / ED Course  I have reviewed the triage vital signs and the nursing notes.  Pertinent labs & imaging results that were available during my care of the patient were reviewed by me and considered in my medical decision making (see chart for details).     Renal insufficiency is chronic Elevated tropoin is chronic.  Overnight hospitalization offered to patient and to his daughter which they both declined.,  They wish to follow-up with his PMD as outpatient.  I did discuss that I was uncertain why patient had syncopal event.  I told patient and his daughter  about possible lung nodule need for follow-up CT scan of chest.  Laceration of thumb does not require repair Final Clinical Impressions(s) / ED Diagnoses  Diagnosis #1 cough #2 syncope #3 chronic renal insufficiency #4 laceration of right thumb #5 contusion of right thigh Final diagnoses:  None    ED Discharge Orders    None       Orlie Dakin, MD 10/05/17 1219

## 2017-10-04 NOTE — Discharge Instructions (Signed)
Call Dr. Sharlet Salina tomorrow to schedule a follow-up appointment for this week.  Take your medications as prescribed.  Wash the wound on your thumb daily with soap and water.  Signs of infection include redness around the wound, more pain, drainage from the wound or fever.  If you think you might be developing infection see your doctor sooner or return to the emergency department.  Return if concern for any reason.  The x-ray of your chest today shows a possible nodule on your right lung.  Ask Dr. Sharlet Salina to order a CT scan of your chest within the next 3 months

## 2017-10-13 ENCOUNTER — Ambulatory Visit: Payer: Self-pay | Admitting: *Deleted

## 2017-10-13 NOTE — Telephone Encounter (Signed)
Pts  Daughter   Calling  For  Her  Father   She  Only  Wants  Him to  To  See  Dr  Sharlet Salina  Tomorrow.In  Office    She  Is  Stating   She   Will  Not  Take  Him to  [facility] .  Appointment made   With  Dr  Raeford Razor  At   [place]  At  1 pm  Tomorrow.  Pts daughter  States  She  Lester  His  Oxygen.  When  Questioning the  Daughter  With  Specifics  Of pts symptoms she becomes angry.   Reason for Disposition . [1] MODERATE leg swelling (e.g., swelling extends up to knees) AND [2] new onset or worsening  Answer Assessment - Initial Assessment Questions 1. ONSET: "When did the swelling start?" (e.g., minutes, hours, days)       Varies   2. LOCATION: "What part of the leg is swollen?"  "Are both legs swollen or just one leg?"      Both  Ankles   3. SEVERITY: "How bad is the swelling?" (e.g., localized; mild, moderate, severe)  - Localized - small area of swelling localized to one leg  - MILD pedal edema - swelling limited to foot and ankle, pitting edema < 1/4 inch (6 mm) deep, rest and elevation eliminate most or all swelling  - MODERATE edema - swelling of lower leg to knee, pitting edema > 1/4 inch (6 mm) deep, rest and elevation only partially reduce swelling  - SEVERE edema - swelling extends above knee, facial or hand swelling present         Moderate  4. REDNESS: "Does the swelling look red or infected?"         No  5. PAIN: "Is the swelling painful to touch?" If so, ask: "How painful is it?"   (Scale 1-10; mild, moderate or severe)     9 6. FEVER: "Do you have a fever?" If so, ask: "What is it, how was it measured, and when did it start?"        no 7. CAUSE: "What do you think is causing the leg swelling?"     fluid 8. MEDICAL HISTORY: "Do you have a history of heart failure, kidney disease, liver failure, or cancer?"      chf   9. RECURRENT SYMPTOM: "Have you had leg swelling before?" If so, ask: "When was the last time?" "What happened that time?"     Yes     10. OTHER SYMPTOMS: "Do you have any other symptoms?" (e.g., chest pain, difficulty breathing)         Difficulty    Breathing  daughter  Is  Nurse who  Lives  With   Him  11. PREGNANCY: "Is there any chance you are pregnant?" "When was your last menstrual period?"       n/a  Protocols used: LEG SWELLING AND EDEMA-A-AH

## 2017-10-14 ENCOUNTER — Encounter: Payer: Self-pay | Admitting: Family Medicine

## 2017-10-14 ENCOUNTER — Telehealth: Payer: Self-pay | Admitting: Family Medicine

## 2017-10-14 ENCOUNTER — Ambulatory Visit (INDEPENDENT_AMBULATORY_CARE_PROVIDER_SITE_OTHER): Payer: Medicare Other | Admitting: Family Medicine

## 2017-10-14 ENCOUNTER — Ambulatory Visit (INDEPENDENT_AMBULATORY_CARE_PROVIDER_SITE_OTHER)
Admission: RE | Admit: 2017-10-14 | Discharge: 2017-10-14 | Disposition: A | Discharge status: Home / Self Care | Payer: Medicare Other | Source: Ambulatory Visit | Attending: Family Medicine | Admitting: Family Medicine

## 2017-10-14 ENCOUNTER — Ambulatory Visit (INDEPENDENT_AMBULATORY_CARE_PROVIDER_SITE_OTHER): Payer: Medicare Other | Admitting: General Practice

## 2017-10-14 VITALS — BP 112/62 | HR 62 | Temp 97.5°F | Ht 67.0 in

## 2017-10-14 DIAGNOSIS — J189 Pneumonia, unspecified organism: Secondary | ICD-10-CM | POA: Diagnosis not present

## 2017-10-14 DIAGNOSIS — R079 Chest pain, unspecified: Secondary | ICD-10-CM | POA: Diagnosis not present

## 2017-10-14 DIAGNOSIS — R059 Cough, unspecified: Secondary | ICD-10-CM

## 2017-10-14 DIAGNOSIS — R0781 Pleurodynia: Secondary | ICD-10-CM | POA: Diagnosis not present

## 2017-10-14 DIAGNOSIS — B0229 Other postherpetic nervous system involvement: Secondary | ICD-10-CM | POA: Diagnosis not present

## 2017-10-14 DIAGNOSIS — M25551 Pain in right hip: Secondary | ICD-10-CM | POA: Diagnosis not present

## 2017-10-14 DIAGNOSIS — S79911A Unspecified injury of right hip, initial encounter: Secondary | ICD-10-CM | POA: Diagnosis not present

## 2017-10-14 DIAGNOSIS — I4891 Unspecified atrial fibrillation: Secondary | ICD-10-CM

## 2017-10-14 DIAGNOSIS — R05 Cough: Secondary | ICD-10-CM | POA: Diagnosis not present

## 2017-10-14 DIAGNOSIS — J9611 Chronic respiratory failure with hypoxia: Secondary | ICD-10-CM | POA: Diagnosis not present

## 2017-10-14 DIAGNOSIS — S299XXA Unspecified injury of thorax, initial encounter: Secondary | ICD-10-CM | POA: Diagnosis not present

## 2017-10-14 LAB — POCT INR: INR: 1.7

## 2017-10-14 MED ORDER — TRAMADOL HCL 50 MG PO TABS
50.0000 mg | ORAL_TABLET | Freq: Two times a day (BID) | ORAL | 3 refills | Status: DC | PRN
Start: 2017-10-14 — End: 2018-03-04

## 2017-10-14 MED ORDER — PREDNISONE 10 MG PO TABS: 10.0000 mg | ORAL_TABLET | Freq: Every day | ORAL | 1 refills | Status: DC

## 2017-10-14 MED ORDER — TEMAZEPAM 30 MG PO CAPS: ORAL_CAPSULE | ORAL | 0 refills | Status: DC

## 2017-10-14 MED ORDER — LIDOCAINE 5 % EX PTCH
1.0000 | MEDICATED_PATCH | Freq: Every day | CUTANEOUS | 1 refills | Status: AC | PRN
Start: 2017-10-14 — End: ?

## 2017-10-14 MED ORDER — WARFARIN SODIUM 3 MG PO TABS
ORAL_TABLET | ORAL | 1 refills | Status: DC
Start: 2017-10-14 — End: 2018-03-04

## 2017-10-14 NOTE — Telephone Encounter (Signed)
Left VM for patient. If his daughter calls back please have her speak with a nurse/CMA and inform that his chest xray suggests an ongoing pneumonia and a possible rib fracture. If they would like to try an antibiotic then can send levaquin but will need to be renally dosed. His Crcl 36 mL/min  If any questions then please take the best time and phone number to call and I will try to call her back.   Rosemarie Ax, MD [facility] 10/14/2017, 5:12 PM

## 2017-10-14 NOTE — Assessment & Plan Note (Addendum)
This is intermittent and ongoing. Possible for viral. Less likely for pneumonia or fluid overload.  - chest xray  - counseled on what the casus of his cough can be from.

## 2017-10-14 NOTE — Patient Instructions (Signed)
Please try compression on the right knee Please try to take tylenol for pain  We will call you with the results from today.

## 2017-10-14 NOTE — Progress Notes (Signed)
Medical treatment/procedure(s) were performed by non-physician practitioner and as supervising physician I was immediately available for consultation/collaboration. I agree with above. Isamar Wellbrock A Jaquay Morneault, MD  

## 2017-10-14 NOTE — Assessment & Plan Note (Signed)
Refilled coumadin today

## 2017-10-14 NOTE — Patient Instructions (Addendum)
Pre visit review using our clinic review tool, if applicable. No additional management support is needed unless otherwise documented below in the visit note.  Tale 1 tablet today and then continue to take 1/2 tablet all days except 1 tablet on Mondays.  Re-check in 1 week.

## 2017-10-14 NOTE — Assessment & Plan Note (Signed)
Pain occurs when he coughs for sneezes. Possible for stress fracture or rib fracture from fall  - rib xray  - counseled on conservative care.

## 2017-10-14 NOTE — Assessment & Plan Note (Signed)
Refilled prednisone today

## 2017-10-14 NOTE — Progress Notes (Signed)
Ricardo Perkins - 84 y.o. male MRN [id number]  Date of birth: 1923-05-14  SUBJECTIVE:  Including CC & ROS.  Chief Complaint  Patient presents with  . Edema    Ricardo Perkins is a 84 y.o. male that is presenting chest pain, right knee bursa swelling, right hip pain, chronic anticoagulation, chronic respiratory failure. He has right side chest pain that occurs with coughing. This pain is severe. The pain radiates to his side and to the back of his scapula. He fell two weeks ago when he had a syncopal event. Unclear he hit this area. Had an EKG and troponin while in the ED. He only had pain with coughing or sneezing. The daughter is providing part of history. Reports this pain is severe and brings him to tears. She would like for him to be admitted but he denies wanted to be admitted. He is on nasal canula and prednisone. He needs refill of prednisone, warfarin, lidocaine patches for chronic compression fractures, temazepam which is needed for sleep, tramadol for chronic pain.   He was seen in the ED on 3/18. He declined being admitted. He had an unexplained syncopal event. He was having a cough, laceration of his right thumb and has chronic renal insufficiency.    Review of Systems  Constitutional: Negative for fever.  HENT: Negative for congestion.   Respiratory: Positive for cough and shortness of breath.   Cardiovascular: Positive for chest pain.  Gastrointestinal: Positive for diarrhea.  Musculoskeletal: Positive for back pain and gait problem.    HISTORY: Past Medical, Surgical, Social, and Family History Reviewed & Updated per EMR.   Pertinent Historical Findings include:  Past Medical History:  Diagnosis Date  . Advanced age   . Arthritis    knees, shoulders   . ASTEATOTIC ECZEMA   . BENIGN PROSTATIC HYPERTROPHY, WITH OBSTRUCTION   . BRADYCARDIA    1st degree AVB  . COPD (chronic obstructive pulmonary disease) (HCC)    on chronic oxygen therapy qhs>daytime  . DECREASED HEARING    . Hx of cardiovascular stress test    a.  MV 4/09:  possible small area of lateral ischemia at the apex, but Dr. Kirk Ruths reviewed this, and in fact, felt it was most likely normal.  b.  Myoview 3/13 was again low risk with an EF of 62% with small reversible defect in the apex suggestive of very mild apical ischemia.  Marland Kitchen Hx of echocardiogram    a. echo in 2009 (in [place]): normal EF, mild LVH;   b. echo 10/13:  mid Ant HK, mod LVH, EF 55%, trivial AI, mod LAE, mild RVE, mild reduced RVSF, PASP 46  . HYPERTENSION   . INSOMNIA, PERSISTENT   . Large bilateral inguinal hernias - chronic, containing small bowel (right) and sigmoid colon (left) 01/15/2011   s/p B repair  . LEG EDEMA, BILATERAL   . OA (osteoarthritis) of knee    S/p right TKR March '13 (Dr. Wynelle Link)   . PAROXYSMAL ATRIAL FIBRILLATION    coumadin rx  . RENAL INSUFFICIENCY   . RHINITIS, VASOMOTOR 01/07/2010    Past Surgical History:  Procedure Laterality Date  . APPENDECTOMY     @ 23  . EYE SURGERY     bilateral cataract surgery   . HERNIA REPAIR  01/2011   bilateral inguinal hernia   . KNEE SURGERY  right  . OTHER SURGICAL HISTORY     left finger surgery   . TONSILLECTOMY    .  TOTAL KNEE ARTHROPLASTY  10/26/2011   Procedure: TOTAL KNEE ARTHROPLASTY;  Surgeon: Gearlean Alf, MD;  Location: [facility];  Service: Orthopedics;  Laterality: Right;    No Known Allergies  Family History  Problem Relation Age of Onset  . Heart disease Father   . Heart attack Father   . Cancer Mother        colon   . Cirrhosis Sister        liver failure  . Heart attack Brother   . Diabetes Neg Hx      Social History   Socioeconomic History  . Marital status: Single    Spouse name: Not on file  . Number of children: Not on file  . Years of education: Not on file  . Highest education level: Not on file  Occupational History  . Not on file  Social Needs  . Financial resource strain: Not on file  . Food insecurity:     Worry: Not on file    Inability: Not on file  . Transportation needs:    Medical: Not on file    Non-medical: Not on file  Tobacco Use  . Smoking status: Former Smoker    Packs/day: 1.00    Years: 30.00    Pack years: 30.00    Types: Cigarettes    Last attempt to quit: 07/20/1998    Years since quitting: 19.2  . Smokeless tobacco: Never Used  Substance and Sexual Activity  . Alcohol use: Yes    Alcohol/week: 0.0 oz    Comment: wine occasional   . Drug use: No  . Sexual activity: Not on file  Lifestyle  . Physical activity:    Days per week: Not on file    Minutes per session: Not on file  . Stress: Not on file  Relationships  . Social connections:    Talks on phone: Not on file    Gets together: Not on file    Attends religious service: Not on file    Active member of club or organization: Not on file    Attends meetings of clubs or organizations: Not on file    Relationship status: Not on file  . Intimate partner violence:    Fear of current or ex partner: Not on file    Emotionally abused: Not on file    Physically abused: Not on file    Forced sexual activity: Not on file  Other Topics Concern  . 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  ENT: normal lips, normal nasal mucosa,  Eye: normal EOM, normal conjunctiva and lids CV:  +2 pedal pulses   Resp: no accessory muscle use, non-labored,  Skin: bandage on right thumb  Neuro: normal tone, normal sensation to touch Psych:   alert and  oriented MSK: Sitting in wheel chair,  Hip:  Normal IR and ER  Normal strength  to resistance with hip flexion  Negative SLR b/l  Right knee:  Has a swollen bursa on anterior knee  Well healed vertical incision on anterior knee  Neurovascularly intact     ASSESSMENT & PLAN:   I spent 40 minutes with this patient, greater than 50% was face-to-face time counseling regarding the below diagnosis.   PAROXYSMAL ATRIAL FIBRILLATION Refilled coumadin today   Chronic respiratory failure (HCC) Refilled prednisone today   Post herpetic neuralgia Refilled lidocaine patches. Also had chronic compression fractures that lidocaine patches help with.   Cough This is intermittent and ongoing. Possible for viral. Less likely for pneumonia or fluid overload.  - chest xray  - counseled on what the casus of his cough can be from.    Pain of right hip joint Had fall 2 weeks ago and evaluated in ED. Daughter reports having bruising in that area.  - hip and pelvis xray  - counseled on ice and heat   Chest pain Pain occurs when he coughs for sneezes. Possible for stress fracture or rib fracture from fall  - rib xray  - counseled on conservative care.

## 2017-10-14 NOTE — Assessment & Plan Note (Signed)
Refilled lidocaine patches. Also had chronic compression fractures that lidocaine patches help with.

## 2017-10-14 NOTE — Assessment & Plan Note (Signed)
Had fall 2 weeks ago and evaluated in ED. Daughter reports having bruising in that area.  - hip and pelvis xray  - counseled on ice and heat

## 2017-10-18 ENCOUNTER — Telehealth: Payer: Self-pay | Admitting: Internal Medicine

## 2017-10-18 MED ORDER — LEVOFLOXACIN 250 MG PO TABS: 250.0000 mg | ORAL_TABLET | Freq: Every day | ORAL | 0 refills | Status: AC

## 2017-10-18 NOTE — Telephone Encounter (Signed)
Daughter of pt here in lobby after received VM on phone wondering about hip fracture? Or pneumonia?  I reviewed recent films which are significant for right 6th rib fx, as well as RLL pneumonia  I sent levaquin 250 qd for 10 days, and advised f/u within 1-7 days with PCP to see if steroid tx needed as well

## 2017-10-18 NOTE — Telephone Encounter (Signed)
Spoke with patient's daughter requested Levaquin and wanted to know if prednisone needed to be increased and if she needed to keep an ace wrap on her father's chest for compression. Please advise.

## 2017-10-18 NOTE — Addendum Note (Signed)
Addended by: Rosemarie Ax on: 10/18/2017 04:20 PM   Modules accepted: Orders

## 2017-10-18 NOTE — Telephone Encounter (Signed)
Patient's daughter presented to [facility] office and antibiotics were prescribed.   Rosemarie Ax, MD [facility] 10/18/2017, 3:07 PM

## 2017-10-19 ENCOUNTER — Encounter: Payer: Self-pay | Admitting: Internal Medicine

## 2017-10-19 ENCOUNTER — Telehealth: Payer: Self-pay | Admitting: Internal Medicine

## 2017-10-19 ENCOUNTER — Ambulatory Visit (INDEPENDENT_AMBULATORY_CARE_PROVIDER_SITE_OTHER): Payer: Medicare Other | Admitting: Internal Medicine

## 2017-10-19 DIAGNOSIS — R0782 Intercostal pain: Secondary | ICD-10-CM

## 2017-10-19 DIAGNOSIS — K909 Intestinal malabsorption, unspecified: Secondary | ICD-10-CM | POA: Diagnosis not present

## 2017-10-19 DIAGNOSIS — R197 Diarrhea, unspecified: Secondary | ICD-10-CM

## 2017-10-19 DIAGNOSIS — J189 Pneumonia, unspecified organism: Secondary | ICD-10-CM

## 2017-10-19 DIAGNOSIS — R911 Solitary pulmonary nodule: Secondary | ICD-10-CM | POA: Diagnosis not present

## 2017-10-19 DIAGNOSIS — J181 Lobar pneumonia, unspecified organism: Secondary | ICD-10-CM | POA: Diagnosis not present

## 2017-10-19 MED ORDER — PREDNISONE 50 MG PO TABS: 50.0000 mg | ORAL_TABLET | Freq: Every day | ORAL | 0 refills | Status: DC

## 2017-10-19 MED ORDER — PANCRELIPASE (LIP-PROT-AMYL) 36000-114000 UNITS PO CPEP: 108000.0000 [IU] | ORAL_CAPSULE | Freq: Three times a day (TID) | ORAL | 0 refills | Status: DC

## 2017-10-19 NOTE — Telephone Encounter (Signed)
Copied from [place]. Topic: Quick Communication - See Telephone Encounter >> Oct 19, 2017  3:36 PM Aurelio Brash B wrote: CRM for notification. See Telephone encounter for: 10/19/17. Tatecia from [facility]  called to inform Dr Sharlet Salina the pt was approved for 1 year of lidocaine (LIDODERM) 5 % effective 11/08/17 - 11/09/2018 Any questions she can be reached at [phone] option 5

## 2017-10-19 NOTE — Telephone Encounter (Signed)
Pharmacy and patient informed

## 2017-10-19 NOTE — Patient Instructions (Addendum)
We will increase the prednisone for the next 5 days. We have sent in a new prednisone prescription 50 mg daily for 5 days.   We have sent in creon to take 3 pills with meals and 1 pill with snacks for the diarrhea.   We will get the CT of the chest in the next 2-3 weeks after the antibiotics.

## 2017-10-19 NOTE — Progress Notes (Signed)
   Subjective:    Patient ID: Ricardo Perkins, male    DOB: 1922-11-25, 84 y.o.   MRN: [id number]  HPI The patient is a 84 YO man coming in for concerns about recent CXR. They saw another provider. Daughter is present with him and helps to provide history. They were just started on levaquin yesterday and want consultation about if he needs steroids pulse as well. On chronic prednisone 10 mg daily. Some extra cough, wheezing, and fatigue lately. He is chronically ill and on oxygen chronically. His daughter has noticed more tiredness with activity lately and a couple of falls. Denies fevers or chills. He was having some chest pains as well. On the right side to middle. He is also still having diarrhea all the time. Wants to know if there is anything he can try.   Review of Systems  Constitutional: Negative.  Negative for fatigue, fever and unexpected weight change.  HENT: Negative for congestion, ear discharge, ear pain, postnasal drip, rhinorrhea, sinus pressure, sinus pain, sneezing, sore throat, tinnitus, trouble swallowing and voice change.   Eyes: Negative.   Respiratory: Positive for cough, shortness of breath and wheezing. Negative for chest tightness.   Cardiovascular: Negative.  Negative for chest pain, palpitations and leg swelling.  Gastrointestinal: Positive for diarrhea. Negative for abdominal distention, abdominal pain, constipation, nausea and vomiting.  Musculoskeletal: Positive for arthralgias. Negative for gait problem, joint swelling and myalgias.  Skin: Negative.   Neurological: Negative.   Psychiatric/Behavioral: Negative.       Objective:   Physical Exam  Constitutional: He is oriented to person, place, and time. He appears well-developed and well-nourished.  HENT:  Head: Normocephalic and atraumatic.  Eyes: EOM are normal.  Neck: Normal range of motion.  Cardiovascular: Normal rate and regular rhythm.  Pulmonary/Chest: Effort normal. No respiratory distress. He has  wheezes. He has no rales.  Some wheezing right lung more than left  Abdominal: Soft. Bowel sounds are normal. He exhibits no distension. There is no tenderness. There is no rebound.  Musculoskeletal: He exhibits no edema.  Neurological: He is alert and oriented to person, place, and time. Coordination normal.  Skin: Skin is warm and dry.  Psychiatric: He has a normal mood and affect.   Vitals:   10/19/17 1527  BP: 110/70  Pulse: 85  Temp: 97.7 F (36.5 C)  TempSrc: Oral  SpO2: 90%  Weight: 181 lb 6.4 oz (82.3 kg)  Height: 5\' 7"  (1.702 m)      Assessment & Plan:

## 2017-10-20 NOTE — Assessment & Plan Note (Signed)
CT chest ordered for follow up over concern that CXR findings are actually growth of this nodule.

## 2017-10-20 NOTE — Assessment & Plan Note (Signed)
Likely related to rib fracture and encouraged that he can use his tramadol for pain.

## 2017-10-20 NOTE — Assessment & Plan Note (Signed)
CXR with persistent changes in the right lung after course of antibiotics middle of March. This was not followed up. He does have nodule about 1 cm known in right lung. Will order CT lung to be done about 2-3 weeks. He will take the levaquin 10 day course 250 mg daily. Will increase prednisone given the wheezing on exam.

## 2017-10-20 NOTE — Assessment & Plan Note (Signed)
Rx for creon as lomotil helps some but not well.

## 2017-10-28 LAB — POCT INR: INR: 3.8

## 2017-10-29 ENCOUNTER — Ambulatory Visit (INDEPENDENT_AMBULATORY_CARE_PROVIDER_SITE_OTHER): Payer: Medicare Other | Admitting: General Practice

## 2017-10-29 DIAGNOSIS — I4891 Unspecified atrial fibrillation: Secondary | ICD-10-CM

## 2017-10-29 DIAGNOSIS — Z7901 Long term (current) use of anticoagulants: Secondary | ICD-10-CM | POA: Diagnosis not present

## 2017-10-29 NOTE — Progress Notes (Signed)
Medical treatment/procedure(s) were performed by non-physician practitioner and as supervising physician I was immediately available for consultation/collaboration. I agree with above. Elizabeth A Crawford, MD  

## 2017-10-29 NOTE — Patient Instructions (Signed)
Pre visit review using our clinic review tool, if applicable. No additional management support is needed unless otherwise documented below in the visit note.  Hold coumadin today and then reduce dosage to 1/2 tablet all days.  Re-check in 1 week. Dosing instructions given to daughter and she verbalized understanding.  Monitored by [employer]

## 2017-10-31 ENCOUNTER — Encounter: Payer: Self-pay | Admitting: Internal Medicine

## 2017-11-01 NOTE — Telephone Encounter (Signed)
Called daughter with MD response. Daughter stated that she was more worried about a DVT since patients leg was swollen and bruised black and blue and was wondering if a scan could be done of the leg. Dr. Sharlet Salina stated that she was not worried about a clot since INR was 3.8 and the bruising indicated blood flow and will take a long while to go away. Daughter stated that if Dr. Sharlet Salina was not worried than she will not worry

## 2017-11-03 ENCOUNTER — Ambulatory Visit (INDEPENDENT_AMBULATORY_CARE_PROVIDER_SITE_OTHER)
Admission: RE | Admit: 2017-11-03 | Discharge: 2017-11-03 | Disposition: A | Discharge status: Home / Self Care | Payer: Medicare Other | Source: Ambulatory Visit | Attending: Internal Medicine | Admitting: Internal Medicine

## 2017-11-03 DIAGNOSIS — R911 Solitary pulmonary nodule: Secondary | ICD-10-CM

## 2017-11-04 DIAGNOSIS — I480 Paroxysmal atrial fibrillation: Secondary | ICD-10-CM | POA: Diagnosis not present

## 2017-11-08 ENCOUNTER — Ambulatory Visit (INDEPENDENT_AMBULATORY_CARE_PROVIDER_SITE_OTHER): Payer: Medicare Other | Admitting: Internal Medicine

## 2017-11-08 ENCOUNTER — Ambulatory Visit: Payer: Self-pay | Admitting: General Practice

## 2017-11-08 ENCOUNTER — Encounter: Payer: Self-pay | Admitting: Internal Medicine

## 2017-11-08 ENCOUNTER — Other Ambulatory Visit (INDEPENDENT_AMBULATORY_CARE_PROVIDER_SITE_OTHER): Payer: Medicare Other

## 2017-11-08 VITALS — BP 130/70 | HR 75 | Temp 97.7°F | Ht 67.0 in | Wt 182.0 lb

## 2017-11-08 DIAGNOSIS — I4891 Unspecified atrial fibrillation: Secondary | ICD-10-CM

## 2017-11-08 DIAGNOSIS — J181 Lobar pneumonia, unspecified organism: Secondary | ICD-10-CM | POA: Diagnosis not present

## 2017-11-08 DIAGNOSIS — R197 Diarrhea, unspecified: Secondary | ICD-10-CM

## 2017-11-08 DIAGNOSIS — R0602 Shortness of breath: Secondary | ICD-10-CM

## 2017-11-08 DIAGNOSIS — J189 Pneumonia, unspecified organism: Secondary | ICD-10-CM

## 2017-11-08 DIAGNOSIS — K909 Intestinal malabsorption, unspecified: Secondary | ICD-10-CM

## 2017-11-08 DIAGNOSIS — R0782 Intercostal pain: Secondary | ICD-10-CM

## 2017-11-08 DIAGNOSIS — R911 Solitary pulmonary nodule: Secondary | ICD-10-CM | POA: Diagnosis not present

## 2017-11-08 LAB — CBC
HEMATOCRIT: 43.7 % (ref 39.0–52.0)
Hemoglobin: 14.4 g/dL (ref 13.0–17.0)
MCHC: 33 g/dL (ref 30.0–36.0)
MCV: 90.6 fl (ref 78.0–100.0)
Platelets: 181 10*3/uL (ref 150.0–400.0)
RBC: 4.82 Mil/uL (ref 4.22–5.81)
RDW: 16.4 % — ABNORMAL HIGH (ref 11.5–15.5)
WBC: 7 10*3/uL (ref 4.0–10.5)

## 2017-11-08 LAB — BRAIN NATRIURETIC PEPTIDE: Pro B Natriuretic peptide (BNP): 112 pg/mL — ABNORMAL HIGH (ref 0.0–100.0)

## 2017-11-08 LAB — COMPREHENSIVE METABOLIC PANEL
ALBUMIN: 4.2 g/dL (ref 3.5–5.2)
ALK PHOS: 73 U/L (ref 39–117)
ALT: 15 U/L (ref 0–53)
AST: 13 U/L (ref 0–37)
BUN: 34 mg/dL — ABNORMAL HIGH (ref 6–23)
CO2: 26 mEq/L (ref 19–32)
CREATININE: 1.57 mg/dL — AB (ref 0.40–1.50)
Calcium: 9.6 mg/dL (ref 8.4–10.5)
Chloride: 104 mEq/L (ref 96–112)
GFR: 44.03 mL/min — ABNORMAL LOW (ref >60.00)
Glucose, Bld: 112 mg/dL — ABNORMAL HIGH (ref 70–99)
Potassium: 4.7 mEq/L (ref 3.5–5.1)
SODIUM: 141 meq/L (ref 135–145)
TOTAL PROTEIN: 6.5 g/dL (ref 6.0–8.3)
Total Bilirubin: 0.7 mg/dL (ref 0.2–1.2)

## 2017-11-08 LAB — POCT INR: INR: 1.8

## 2017-11-08 NOTE — Patient Instructions (Signed)
Pre visit review using our clinic review tool, if applicable. No additional management support is needed unless otherwise documented below in the visit note.  Take 1 tablet today and then continue to take 1/2 tablet daily.  Re-check in 1 week.  Jocelyn Lamer, patient's daughter verbalized understanding.

## 2017-11-08 NOTE — Patient Instructions (Signed)
We will check the labs today and get another CT scan in about 3 months or so.

## 2017-11-08 NOTE — Progress Notes (Signed)
   Subjective:    Patient ID: Ricardo Perkins, male    DOB: Apr 04, 1923, 84 y.o.   MRN: [id number]  HPI The patient is a 84 YO man coming in for discussion about CT results. The CT scan did show a lesion in the right lung grew from about 1 cm to about 3 by 5.5 cm in the last 1.5-2 years. This was suspicious for cancerous. We are discussing what he wants to pursue if anything. He understands that a biopsy is the only way to tell definitively what is going on. He is still using oxygen all the time at home. The pain in his right side is improving gradually. He does get some SOB and pain randomly with breathing. Last INR 2 weeks ago 3.8 and most recent 1.8. Denies new swelling in legs although they do swell all the time. Some color change on his toes. Diarrhea is improved with creon and no longer explosive.   Review of Systems  Constitutional: Positive for activity change and appetite change. Negative for chills, fatigue and fever.  HENT: Negative.   Eyes: Negative.   Respiratory: Positive for cough and shortness of breath. Negative for chest tightness.   Cardiovascular: Positive for chest pain and leg swelling. Negative for palpitations.  Gastrointestinal: Negative for abdominal distention, abdominal pain, constipation, diarrhea, nausea and vomiting.  Musculoskeletal: Positive for arthralgias and gait problem.  Skin: Positive for color change.  Neurological: Positive for weakness. Negative for dizziness, light-headedness and numbness.  Psychiatric/Behavioral: Negative.       Objective:   Physical Exam  Constitutional: He is oriented to person, place, and time. He appears well-developed and well-nourished.  HENT:  Head: Normocephalic and atraumatic.  Eyes: EOM are normal.  Neck: Normal range of motion.  Cardiovascular: Normal rate and regular rhythm.  Pulmonary/Chest: Effort normal. No respiratory distress. He has no wheezes. He has no rales.  Stable lung findings. On oxygen.   Abdominal: Soft.  Bowel sounds are normal. He exhibits no distension. There is no tenderness. There is no rebound.  Musculoskeletal: He exhibits no edema.  Neurological: He is alert and oriented to person, place, and time. Coordination normal.  Skin: Skin is warm and dry.  Some purple discoloration on the toes left foot  Psychiatric: He has a normal mood and affect.   Vitals:   11/08/17 1529  BP: 130/70  Pulse: 75  Temp: 97.7 F (36.5 C)  TempSrc: Oral  SpO2: (!) 85%  Weight: 182 lb (82.6 kg)  Height: 5\' 7"  (1.702 m)      Assessment & Plan:

## 2017-11-09 LAB — D-DIMER, QUANTITATIVE (NOT AT ARMC): D DIMER QUANT: 0.78 ug{FEU}/mL — AB (ref <0.50)

## 2017-11-09 NOTE — Assessment & Plan Note (Signed)
Suspect recent CAP diagnosis is related to lung nodule on CT which could be causing the distortion on CXR and not a new pneumonia.

## 2017-11-09 NOTE — Assessment & Plan Note (Signed)
Checking d-dimer today due to low INR making PE a possibility with new possible malignancy.

## 2017-11-09 NOTE — Assessment & Plan Note (Signed)
Changed from 2017, we talked about likely possibility that this is cancerous and biopsy would be only way to be sure. He elects to not proceed with this at this time. Would like to monitor in 3 months for any growth. Depending on CT results then will decide on plan of action. He is not sure about wanting any kind of treatment if this is cancer and we talked about his poor lung function surgery would not likely be an option for him even if it would help.

## 2017-11-09 NOTE — Assessment & Plan Note (Signed)
Improved with creon although not perfect.

## 2017-11-18 LAB — POCT INR: INR: 2.1

## 2017-11-18 NOTE — Progress Notes (Signed)
HPI: FU atrial fibrillation. Myoview 3/13 was low risk with an EF of 62% with small reversible defect in the apex suggestive of very mild apical ischemia. Chest CT 1/17 showed TAA 4.1 cm. Abd CT 3/17 showed pulmonary fibrosis, AAA 3.6 x 3.7 cm. We have elected not to pursue follow-up studies for his thoracic and aortic aneurysms because he would not be a candidate for repair. Had GI bleed 3/17 felt likely diverticular; coumadin transiently held. Patient admitted September 2018 with generalized weakness and recurrent falls. Carotid Doppler September 2018 showed 1-39% left stenosis. Mild soft plaque throughout the right common carotid artery. Echo 12/18 showed normal LV function, mild AI; mild RVE and mild TR. Chest CT 4/19 showed RLL mass concerning for lung cancer. Since last seen,  patient has some dyspnea on exertion.  He has pedal edema but is unwilling to take Lasix.  No chest pain or syncope.  Current Outpatient Medications  Medication Sig Dispense Refill  . albuterol (PROVENTIL) (2.5 MG/3ML) 0.083% nebulizer solution Take 1 ampule by nebulization every 6 (six) hours as needed for wheezing.    Marland Kitchen amLODipine (NORVASC) 10 MG tablet Take 1 tablet (10 mg total) by mouth daily. 90 tablet 3  . finasteride (PROSCAR) 5 MG tablet TAKE 1 TABLET(5 MG) BY MOUTH DAILY 90 tablet 3  . furosemide (LASIX) 20 MG tablet Take 1 tablet (20 mg total) by mouth daily. 90 tablet 3  . lidocaine (LIDODERM) 5 % Place 1 patch onto the skin daily as needed (pain). Remove & Discard patch within 12 hours or as directed by MD 30 patch 1  . lipase/protease/amylase (CREON) 36000 UNITS CPEP capsule Take 3 capsules (108,000 Units total) by mouth 3 (three) times daily before meals. 270 capsule 0  . metoprolol tartrate (LOPRESSOR) 25 MG tablet Take 0.5 tablets (12.5 mg total) by mouth 2 (two) times daily. 90 tablet 3  . potassium chloride SA (K-DUR,KLOR-CON) 20 MEQ tablet Take 1 tablet (20 mEq total) by mouth daily. 90 tablet 3    . predniSONE (DELTASONE) 10 MG tablet Take 1 tablet (10 mg total) by mouth daily with breakfast. 90 tablet 1  . tamsulosin (FLOMAX) 0.4 MG CAPS capsule Take 1 capsule (0.4 mg total) by mouth daily after supper. 90 capsule 3  . temazepam (RESTORIL) 30 MG capsule TAKE 1 CAPSULE BY MOUTH EVERY DAY AT BEDTIME 90 capsule 0  . tiotropium (SPIRIVA) 18 MCG inhalation capsule Place 1 capsule (18 mcg total) into inhaler and inhale daily. 30 capsule 12  . traMADol (ULTRAM) 50 MG tablet Take 1 tablet (50 mg total) by mouth every 12 (twelve) hours as needed. 60 tablet 3  . warfarin (COUMADIN) 3 MG tablet Take 1.5mg  Every day except Monday. On Mondays, take 3mg . 90 tablet 1   No current facility-administered medications for this visit.      Past Medical History:  Diagnosis Date  . Advanced age   . Arthritis    knees, shoulders   . ASTEATOTIC ECZEMA   . BENIGN PROSTATIC HYPERTROPHY, WITH OBSTRUCTION   . BRADYCARDIA    1st degree AVB  . COPD (chronic obstructive pulmonary disease) (HCC)    on chronic oxygen therapy qhs>daytime  . DECREASED HEARING   . Hx of cardiovascular stress test    a.  MV 4/09:  possible small area of lateral ischemia at the apex, but Dr. Kirk Ruths reviewed this, and in fact, felt it was most likely normal.  b.  Myoview 3/13 was again  low risk with an EF of 62% with small reversible defect in the apex suggestive of very mild apical ischemia.  Marland Kitchen Hx of echocardiogram    a. echo in 2009 (in [place]): normal EF, mild LVH;   b. echo 10/13:  mid Ant HK, mod LVH, EF 55%, trivial AI, mod LAE, mild RVE, mild reduced RVSF, PASP 46  . HYPERTENSION   . INSOMNIA, PERSISTENT   . Large bilateral inguinal hernias - chronic, containing small bowel (right) and sigmoid colon (left) 01/15/2011   s/p B repair  . LEG EDEMA, BILATERAL   . OA (osteoarthritis) of knee    S/p right TKR March '13 (Dr. Wynelle Link)   . PAROXYSMAL ATRIAL FIBRILLATION    coumadin rx  . RENAL INSUFFICIENCY   .  RHINITIS, VASOMOTOR 01/07/2010    Past Surgical History:  Procedure Laterality Date  . APPENDECTOMY     @ 23  . EYE SURGERY     bilateral cataract surgery   . HERNIA REPAIR  01/2011   bilateral inguinal hernia   . KNEE SURGERY  right  . OTHER SURGICAL HISTORY     left finger surgery   . TONSILLECTOMY    . TOTAL KNEE ARTHROPLASTY  10/26/2011   Procedure: TOTAL KNEE ARTHROPLASTY;  Surgeon: Gearlean Alf, MD;  Location: [facility];  Service: Orthopedics;  Laterality: Right;    Social History   Socioeconomic History  . Marital status: Single    Spouse name: Not on file  . Number of children: Not on file  . Years of education: Not on file  . Highest education level: Not on file  Occupational History  . Not on file  Social Needs  . Financial resource strain: Not on file  . Food insecurity:    Worry: Not on file    Inability: Not on file  . Transportation needs:    Medical: Not on file    Non-medical: Not on file  Tobacco Use  . Smoking status: Former Smoker    Packs/day: 1.00    Years: 30.00    Pack years: 30.00    Types: Cigarettes    Last attempt to quit: 07/20/1998    Years since quitting: 19.3  . Smokeless tobacco: Never Used  Substance and Sexual Activity  . Alcohol use: Yes    Alcohol/week: 0.0 oz    Comment: wine occasional   . Drug use: No  . Sexual activity: Not on file  Lifestyle  . Physical activity:    Days per week: Not on file    Minutes per session: Not on file  . Stress: Not on file  Relationships  . Social connections:    Talks on phone: Not on file    Gets together: Not on file    Attends religious service: Not on file    Active member of club or organization: Not on file    Attends meetings of clubs or organizations: Not on file    Relationship status: Not on file  . Intimate partner violence:    Fear of current or ex partner: Not on file    Emotionally abused: Not on file    Physically abused: Not on file    Forced sexual activity: Not on  file  Other Topics Concern  . 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 Heart disease Father   . Heart attack Father   . Cancer Mother        colon   . Cirrhosis Sister        liver failure  . Heart attack Brother   . Diabetes Neg Hx     ROS: no fevers or chills, productive cough, hemoptysis, dysphasia, odynophagia, melena, hematochezia, dysuria, hematuria, rash, seizure activity, orthopnea, PND, claudication. Remaining systems are negative.  Physical Exam: Well-developed frail in no acute distress.  Skin is warm and dry.  HEENT is normal.  Neck is supple.  Chest is clear to auscultation with normal expansion.  Cardiovascular exam is regular rate and rhythm.  Abdominal exam nontender or distended. No masses palpated. Extremities show 1+ edema. neuro grossly intact   A/P  1 paroxysmal atrial fibrillation-plan to continue beta-blockade for rate control if atrial fibrillation recurs.  Continue Coumadin with goal INR 2-3.  2 hypertension-blood pressure is controlled this morning.  Continue present medications.  3 chronic diastolic congestive heart failure-patient is unwilling to take his Lasix because of frequent urination.  I have asked him to take his develops worsening edema or dyspnea.  New low-sodium diet and fluid restriction.  4 history of cardiomyopathy-LV function proved on most recent echocardiogram.  Continue beta-blocker.  Conservative management given patient's age.  5 abdominal aortic aneurysm/thoracic aortic aneurysm-patient is not a candidate for surgical intervention.  We have therefore elected not to pursue follow-up imaging studies.  Patient and daughter have agreed to this previously.  6 lung mass-patient has declined further  therapy.  Kirk Ruths, MD

## 2017-11-23 ENCOUNTER — Ambulatory Visit (INDEPENDENT_AMBULATORY_CARE_PROVIDER_SITE_OTHER): Payer: Medicare Other | Admitting: General Practice

## 2017-11-23 DIAGNOSIS — I4891 Unspecified atrial fibrillation: Secondary | ICD-10-CM

## 2017-11-23 NOTE — Patient Instructions (Signed)
Pre visit review using our clinic review tool, if applicable. No additional management support is needed unless otherwise documented below in the visit note.  Continue to take 1/2 tablet all days.  Re-check in 1 week. Dosing instructions given to daughter and she verbalized understanding.  Monitored by [employer]

## 2017-11-24 ENCOUNTER — Ambulatory Visit (INDEPENDENT_AMBULATORY_CARE_PROVIDER_SITE_OTHER): Payer: Medicare Other | Admitting: Cardiology

## 2017-11-24 ENCOUNTER — Encounter: Payer: Self-pay | Admitting: Cardiology

## 2017-11-24 VITALS — BP 131/71 | HR 53 | Ht 66.0 in | Wt 181.4 lb

## 2017-11-24 DIAGNOSIS — I480 Paroxysmal atrial fibrillation: Secondary | ICD-10-CM | POA: Diagnosis not present

## 2017-11-24 DIAGNOSIS — I714 Abdominal aortic aneurysm, without rupture, unspecified: Secondary | ICD-10-CM

## 2017-11-24 DIAGNOSIS — I5032 Chronic diastolic (congestive) heart failure: Secondary | ICD-10-CM

## 2017-11-24 DIAGNOSIS — I10 Essential (primary) hypertension: Secondary | ICD-10-CM

## 2017-11-24 NOTE — Patient Instructions (Signed)
Your physician wants you to follow-up in: 6 MONTHS WITH DR CRENSHAW You will receive a reminder letter in the mail two months in advance. If you don't receive a letter, please call our office to schedule the follow-up appointment.   If you need a refill on your cardiac medications before your next appointment, please call your pharmacy.  

## 2017-12-06 LAB — POCT INR: INR: 2.1 (ref 2.0–3.0)

## 2017-12-07 ENCOUNTER — Ambulatory Visit (INDEPENDENT_AMBULATORY_CARE_PROVIDER_SITE_OTHER): Payer: Medicare Other | Admitting: General Practice

## 2017-12-07 DIAGNOSIS — I4891 Unspecified atrial fibrillation: Secondary | ICD-10-CM | POA: Diagnosis not present

## 2017-12-07 NOTE — Patient Instructions (Signed)
Pre visit review using our clinic review tool, if applicable. No additional management support is needed unless otherwise documented below in the visit note. Continue to take 1/2 tablet all days.  Re-check in 1 week. Dosing instructions given to daughter and she verbalized understanding.  Monitored by [employer]

## 2017-12-07 NOTE — Progress Notes (Signed)
Medical treatment/procedure(s) were performed by non-physician practitioner and as supervising physician I was immediately available for consultation/collaboration. I agree with above. Elizabeth A Crawford, MD  

## 2017-12-22 LAB — POCT INR: INR: 2.3 (ref 2.0–3.0)

## 2017-12-24 ENCOUNTER — Ambulatory Visit (INDEPENDENT_AMBULATORY_CARE_PROVIDER_SITE_OTHER): Payer: Medicare Other | Admitting: General Practice

## 2017-12-24 DIAGNOSIS — I4891 Unspecified atrial fibrillation: Secondary | ICD-10-CM

## 2017-12-24 NOTE — Patient Instructions (Signed)
Pre visit review using our clinic review tool, if applicable. No additional management support is needed unless otherwise documented below in the visit note.  Continue to take 1/2 tablet all days.  Re-check in 1 week. Dosing instructions given to daughter and she verbalized understanding.  Monitored by [employer]

## 2018-01-05 DIAGNOSIS — I480 Paroxysmal atrial fibrillation: Secondary | ICD-10-CM | POA: Diagnosis not present

## 2018-01-06 ENCOUNTER — Ambulatory Visit (INDEPENDENT_AMBULATORY_CARE_PROVIDER_SITE_OTHER): Payer: Medicare Other | Admitting: General Practice

## 2018-01-06 DIAGNOSIS — I4891 Unspecified atrial fibrillation: Secondary | ICD-10-CM | POA: Diagnosis not present

## 2018-01-06 LAB — POCT INR: INR: 1.9 — AB (ref 2.0–3.0)

## 2018-01-06 NOTE — Patient Instructions (Addendum)
Pre visit review using our clinic review tool, if applicable. No additional management support is needed unless otherwise documented below in the visit note.  Take 1 tablet today and then continue to take 1/2 tablet all days except 1 tablet on Mondays.  Re-check in 1 week. Dosing instructions given to daughter and she verbalized understanding.  Monitored by [employer]

## 2018-01-22 ENCOUNTER — Other Ambulatory Visit: Payer: Self-pay | Admitting: Internal Medicine

## 2018-01-25 ENCOUNTER — Ambulatory Visit (INDEPENDENT_AMBULATORY_CARE_PROVIDER_SITE_OTHER): Payer: Medicare Other | Admitting: General Practice

## 2018-01-25 DIAGNOSIS — I4891 Unspecified atrial fibrillation: Secondary | ICD-10-CM

## 2018-01-25 LAB — POCT INR: INR: 2.5 (ref 2.0–3.0)

## 2018-01-25 NOTE — Patient Instructions (Addendum)
Pre visit review using our clinic review tool, if applicable. No additional management support is needed unless otherwise documented below in the visit note.  Continue to take 1/2 tablet all days except 1 tablet on Mondays.  Re-check in 1 week. Dosing instructions given to daughter and she verbalized understanding.  Monitored by [employer]

## 2018-01-25 NOTE — Progress Notes (Signed)
Medical treatment/procedure(s) were performed by non-physician practitioner and as supervising physician I was immediately available for consultation/collaboration. I agree with above. Anasia Agro A Gemma Ruan, MD  

## 2018-02-02 LAB — POCT INR: INR: 3.2 — AB (ref 2.0–3.0)

## 2018-02-03 ENCOUNTER — Ambulatory Visit (INDEPENDENT_AMBULATORY_CARE_PROVIDER_SITE_OTHER): Payer: Medicare Other | Admitting: General Practice

## 2018-02-03 DIAGNOSIS — I4891 Unspecified atrial fibrillation: Secondary | ICD-10-CM | POA: Diagnosis not present

## 2018-02-03 NOTE — Patient Instructions (Signed)
Pre visit review using our clinic review tool, if applicable. No additional management support is needed unless otherwise documented below in the visit note.  Hold coumadin today and then continue to take 1/2 tablet all days except 1 tablet on Mondays.  Re-check in 1 week. Dosing instructions given to daughter and she verbalized understanding.  Monitored by [employer]

## 2018-02-05 ENCOUNTER — Other Ambulatory Visit: Payer: Self-pay | Admitting: Family Medicine

## 2018-02-11 ENCOUNTER — Other Ambulatory Visit: Payer: Self-pay | Admitting: Internal Medicine

## 2018-02-11 ENCOUNTER — Other Ambulatory Visit: Payer: Self-pay

## 2018-02-11 MED ORDER — TEMAZEPAM 30 MG PO CAPS: ORAL_CAPSULE | ORAL | 0 refills | Status: DC

## 2018-02-11 MED ORDER — TEMAZEPAM 30 MG PO CAPS: 30.0000 mg | ORAL_CAPSULE | Freq: Every evening | ORAL | 0 refills | Status: AC | PRN

## 2018-02-11 NOTE — Telephone Encounter (Signed)
[name], daughter, calling in very upset that medication was denied. She states that a verbal agreement at the last appt was made between pt and Dr. Sharlet Salina that he would come in every 6 months. Jocelyn Lamer states pt has been out of medication for 1 week. I did notify her the OV 11/08/17 states to follow up in 3 months. Ms. Mariea Clonts was very agitated during the call stating pt has been on these meds for 15 years and isn't going to change. She states it needs to be sent today and she would like a phone call to notify it is done.  [place], [place] RD AT [place]

## 2018-02-11 NOTE — Telephone Encounter (Signed)
Please call and let the patient's daughter know that this medication was sent to another provider which is why they declined it as it is not appropriate to refill if they are not taking care of the patient long term. We have refilled the medication today. However, we cannot tolerate yelling on the phone as this is not appropriate behavior at any time. If this behavior happens again then we cannot continue to see her father here. We understand that this is frustrating but yelling is not the way to treat anyone at any time.

## 2018-02-11 NOTE — Addendum Note (Signed)
Addended by: Pricilla Holm A on: 02/11/2018 04:14 PM   Modules accepted: Orders

## 2018-02-14 ENCOUNTER — Ambulatory Visit (INDEPENDENT_AMBULATORY_CARE_PROVIDER_SITE_OTHER): Payer: Medicare Other | Admitting: General Practice

## 2018-02-14 DIAGNOSIS — I4891 Unspecified atrial fibrillation: Secondary | ICD-10-CM | POA: Diagnosis not present

## 2018-02-14 LAB — POCT INR: INR: 3.3 — AB (ref 2.0–3.0)

## 2018-02-14 NOTE — Patient Instructions (Signed)
Pre visit review using our clinic review tool, if applicable. No additional management support is needed unless otherwise documented below in the visit note.  Hold coumadin today and then change dosage and take 1/2 tablet all days.   Re-check in 2 weeks. Dosing instructions given to daughter and she verbalized understanding.  Monitored by [employer]

## 2018-02-24 ENCOUNTER — Telehealth: Payer: Self-pay | Admitting: Internal Medicine

## 2018-02-24 DIAGNOSIS — R911 Solitary pulmonary nodule: Secondary | ICD-10-CM

## 2018-02-24 NOTE — Telephone Encounter (Signed)
Copied from [place] [id number]. Topic: Referral - Request >> Feb 24, 2018 12:05 PM Berneta Levins wrote: Reason for CRM:  [name], Loletha Carrow, calling in.  States that they need to get a CT scheduled for a 3 month follow up for lungs.  Vickie can be reached at [phone]

## 2018-02-24 NOTE — Addendum Note (Signed)
Addended by: Pricilla Holm A on: 02/24/2018 03:08 PM   Modules accepted: Orders

## 2018-02-24 NOTE — Telephone Encounter (Signed)
Patient daughter vicki wanted me to make sure that she gets the phone call when scheduling the CT for patient. Vickie can be reached at [phone]

## 2018-02-24 NOTE — Telephone Encounter (Signed)
Order placed

## 2018-02-24 NOTE — Telephone Encounter (Signed)
Please advise on order

## 2018-03-01 ENCOUNTER — Ambulatory Visit (INDEPENDENT_AMBULATORY_CARE_PROVIDER_SITE_OTHER)
Admission: RE | Admit: 2018-03-01 | Discharge: 2018-03-01 | Disposition: A | Discharge status: Home / Self Care | Payer: Medicare Other | Source: Ambulatory Visit | Attending: Internal Medicine | Admitting: Internal Medicine

## 2018-03-01 DIAGNOSIS — R911 Solitary pulmonary nodule: Secondary | ICD-10-CM

## 2018-03-01 DIAGNOSIS — R918 Other nonspecific abnormal finding of lung field: Secondary | ICD-10-CM | POA: Diagnosis not present

## 2018-03-02 DIAGNOSIS — I480 Paroxysmal atrial fibrillation: Secondary | ICD-10-CM | POA: Diagnosis not present

## 2018-03-02 LAB — POCT INR: INR: 2 (ref 2.0–3.0)

## 2018-03-04 ENCOUNTER — Ambulatory Visit (INDEPENDENT_AMBULATORY_CARE_PROVIDER_SITE_OTHER): Payer: Medicare Other | Admitting: Internal Medicine

## 2018-03-04 ENCOUNTER — Encounter: Payer: Self-pay | Admitting: Internal Medicine

## 2018-03-04 VITALS — BP 100/50 | HR 42 | Temp 97.6°F | Ht 66.0 in

## 2018-03-04 DIAGNOSIS — R609 Edema, unspecified: Secondary | ICD-10-CM | POA: Diagnosis not present

## 2018-03-04 DIAGNOSIS — R630 Anorexia: Secondary | ICD-10-CM | POA: Diagnosis not present

## 2018-03-04 DIAGNOSIS — M25551 Pain in right hip: Secondary | ICD-10-CM | POA: Diagnosis not present

## 2018-03-04 DIAGNOSIS — J449 Chronic obstructive pulmonary disease, unspecified: Secondary | ICD-10-CM | POA: Diagnosis not present

## 2018-03-04 DIAGNOSIS — C3490 Malignant neoplasm of unspecified part of unspecified bronchus or lung: Secondary | ICD-10-CM | POA: Diagnosis not present

## 2018-03-04 DIAGNOSIS — J9611 Chronic respiratory failure with hypoxia: Secondary | ICD-10-CM | POA: Diagnosis not present

## 2018-03-04 DIAGNOSIS — I4891 Unspecified atrial fibrillation: Secondary | ICD-10-CM | POA: Diagnosis not present

## 2018-03-04 DIAGNOSIS — I10 Essential (primary) hypertension: Secondary | ICD-10-CM | POA: Diagnosis not present

## 2018-03-04 DIAGNOSIS — N184 Chronic kidney disease, stage 4 (severe): Secondary | ICD-10-CM | POA: Diagnosis not present

## 2018-03-04 DIAGNOSIS — N401 Enlarged prostate with lower urinary tract symptoms: Secondary | ICD-10-CM | POA: Diagnosis not present

## 2018-03-04 MED ORDER — PANCRELIPASE (LIP-PROT-AMYL) 36000-114000 UNITS PO CPEP: 72000.0000 [IU] | ORAL_CAPSULE | Freq: Three times a day (TID) | ORAL | 3 refills | Status: AC

## 2018-03-04 MED ORDER — TRAMADOL HCL 50 MG PO TABS: 50.0000 mg | ORAL_TABLET | Freq: Two times a day (BID) | ORAL | 3 refills | Status: AC | PRN

## 2018-03-04 MED ORDER — PREDNISONE 20 MG PO TABS: ORAL_TABLET | ORAL | 0 refills | Status: AC

## 2018-03-04 NOTE — Assessment & Plan Note (Addendum)
Presumed clinical diagnosis with growth in interval 3 month period of CT chest. They do not want any treatment and therefore we will not do further imaging. Prognosis is unclear given we do not know staging and pathology of the lung cancer. Given new leg weakness and decreasing sensation it is possible that he is having metastasis to the spine. Increase prednisone to 60 mg and then taper. Referral to hospice given poor functional status and <6 month prognosis. He is accepting of the diagnosis and is not wanting to pursue any more imaging or treatment including staging.

## 2018-03-04 NOTE — Progress Notes (Signed)
   Subjective:    Patient ID: Ricardo Perkins, male    DOB: 02/04/1923, 84 y.o.   MRN: [id number]  HPI The patient is a 84 YO man coming in with his daughter for right arm pain (fell at home, skin tear, they have been covering it, bandage is stuck to his right arm), and questions about lung findings/likely cancer (the area is growing about 1 cm in 3 months, he does have chronic lung disease and chronic SOB, they want to know the prognosis and timeline this might affect him). He is having new lower leg weakness and decreased sensation. This started about 1-2 days ago and he is not able to walk on his own. He denies loss of bowel or bladder. Uses bedside commode or urinal.   Review of Systems  Constitutional: Positive for activity change, appetite change, fatigue and unexpected weight change. Negative for chills and fever.  HENT: Negative.   Eyes: Negative.   Respiratory: Positive for cough, shortness of breath and wheezing. Negative for chest tightness.   Cardiovascular: Negative for chest pain, palpitations and leg swelling.  Gastrointestinal: Negative for abdominal distention, abdominal pain, constipation, diarrhea, nausea and vomiting.  Musculoskeletal: Positive for arthralgias, back pain, gait problem, myalgias and neck pain. Negative for neck stiffness.  Skin: Negative.   Neurological: Positive for weakness. Negative for dizziness, seizures, speech difficulty, light-headedness and headaches.  Psychiatric/Behavioral: Negative.       Objective:   Physical Exam  Constitutional: He is oriented to person, place, and time. He appears well-developed and well-nourished.  HENT:  Head: Normocephalic and atraumatic.  Eyes: EOM are normal.  Neck: Normal range of motion.  Cardiovascular: Normal rate and regular rhythm.  Pulmonary/Chest: Effort normal.  Abdominal: Soft. Bowel sounds are normal. He exhibits no distension. There is no tenderness. There is no rebound.  Musculoskeletal: He exhibits no  edema.  Neurological: He is alert and oriented to person, place, and time. A cranial nerve deficit is present. Coordination abnormal.  Decreased sensation in the left and right thighs and lower legs. Wheelchair bound.  Skin: Skin is warm and dry.  Psychiatric: He has a normal mood and affect.   Vitals:   03/04/18 1045  BP: (!) 100/50  Pulse: (!) 42  Temp: 97.6 F (36.4 C)  TempSrc: Oral  SpO2: (!) 88%  Height: 5\' 6"  (1.676 m)      Assessment & Plan:  Visit time 25 minutes: greater than 50% of that time was spent in face to face counseling and coordination of care with the patient: counseled about lung cancer diagnosis and prognosis as well as implications for the future.

## 2018-03-04 NOTE — Assessment & Plan Note (Signed)
Given low BP will stop amlodipine. Keep metoprolol for parox a fib.

## 2018-03-04 NOTE — Assessment & Plan Note (Signed)
Still using oxygen and with the new lung cancer likely his prognosis is worse due to underlying lung disease.

## 2018-03-04 NOTE — Patient Instructions (Addendum)
We will stop amlodipine and warfarin.   We have given you a prescription for a wheelchair to take to any medical supply shop.   We will get hospice involved to help provide support in the home.   We have sent in the tramadol.  We have increased the prednisone to 60 mg daily for 2 weeks then decrease to 40 mg daily for 5 days then to 20 mg daily for 5 days then back down to 10 mg daily and stay there.

## 2018-03-05 DIAGNOSIS — I4891 Unspecified atrial fibrillation: Secondary | ICD-10-CM | POA: Diagnosis not present

## 2018-03-05 DIAGNOSIS — N184 Chronic kidney disease, stage 4 (severe): Secondary | ICD-10-CM | POA: Diagnosis not present

## 2018-03-05 DIAGNOSIS — R630 Anorexia: Secondary | ICD-10-CM | POA: Diagnosis not present

## 2018-03-05 DIAGNOSIS — C3490 Malignant neoplasm of unspecified part of unspecified bronchus or lung: Secondary | ICD-10-CM | POA: Diagnosis not present

## 2018-03-05 DIAGNOSIS — J449 Chronic obstructive pulmonary disease, unspecified: Secondary | ICD-10-CM | POA: Diagnosis not present

## 2018-03-05 DIAGNOSIS — I10 Essential (primary) hypertension: Secondary | ICD-10-CM | POA: Diagnosis not present

## 2018-03-07 DIAGNOSIS — I10 Essential (primary) hypertension: Secondary | ICD-10-CM | POA: Diagnosis not present

## 2018-03-07 DIAGNOSIS — C3490 Malignant neoplasm of unspecified part of unspecified bronchus or lung: Secondary | ICD-10-CM | POA: Diagnosis not present

## 2018-03-07 DIAGNOSIS — I4891 Unspecified atrial fibrillation: Secondary | ICD-10-CM | POA: Diagnosis not present

## 2018-03-07 DIAGNOSIS — R630 Anorexia: Secondary | ICD-10-CM | POA: Diagnosis not present

## 2018-03-07 DIAGNOSIS — N184 Chronic kidney disease, stage 4 (severe): Secondary | ICD-10-CM | POA: Diagnosis not present

## 2018-03-07 DIAGNOSIS — J449 Chronic obstructive pulmonary disease, unspecified: Secondary | ICD-10-CM | POA: Diagnosis not present

## 2018-03-08 ENCOUNTER — Ambulatory Visit (INDEPENDENT_AMBULATORY_CARE_PROVIDER_SITE_OTHER): Admitting: General Practice

## 2018-03-08 DIAGNOSIS — N184 Chronic kidney disease, stage 4 (severe): Secondary | ICD-10-CM | POA: Diagnosis not present

## 2018-03-08 DIAGNOSIS — C3490 Malignant neoplasm of unspecified part of unspecified bronchus or lung: Secondary | ICD-10-CM | POA: Diagnosis not present

## 2018-03-08 DIAGNOSIS — J449 Chronic obstructive pulmonary disease, unspecified: Secondary | ICD-10-CM | POA: Diagnosis not present

## 2018-03-08 DIAGNOSIS — I4891 Unspecified atrial fibrillation: Secondary | ICD-10-CM

## 2018-03-08 DIAGNOSIS — I10 Essential (primary) hypertension: Secondary | ICD-10-CM | POA: Diagnosis not present

## 2018-03-08 DIAGNOSIS — R630 Anorexia: Secondary | ICD-10-CM | POA: Diagnosis not present

## 2018-03-08 NOTE — Progress Notes (Signed)
Medical treatment/procedure(s) were performed by non-physician practitioner and as supervising physician I was immediately available for consultation/collaboration. I agree with above. Dorethia Jeanmarie A Messina Kosinski, MD  

## 2018-03-08 NOTE — Patient Instructions (Signed)
Pre visit review using our clinic review tool, if applicable. No additional management support is needed unless otherwise documented below in the visit note.  Take 1/2 tablet all days.   Re-check in 2 weeks. Dosing instructions given to daughter and she verbalized understanding.  Monitored by [employer]

## 2018-03-14 DIAGNOSIS — N184 Chronic kidney disease, stage 4 (severe): Secondary | ICD-10-CM | POA: Diagnosis not present

## 2018-03-14 DIAGNOSIS — R630 Anorexia: Secondary | ICD-10-CM | POA: Diagnosis not present

## 2018-03-14 DIAGNOSIS — I10 Essential (primary) hypertension: Secondary | ICD-10-CM | POA: Diagnosis not present

## 2018-03-14 DIAGNOSIS — I4891 Unspecified atrial fibrillation: Secondary | ICD-10-CM | POA: Diagnosis not present

## 2018-03-14 DIAGNOSIS — C3490 Malignant neoplasm of unspecified part of unspecified bronchus or lung: Secondary | ICD-10-CM | POA: Diagnosis not present

## 2018-03-14 DIAGNOSIS — J449 Chronic obstructive pulmonary disease, unspecified: Secondary | ICD-10-CM | POA: Diagnosis not present

## 2018-03-15 ENCOUNTER — Telehealth: Payer: Self-pay | Admitting: Internal Medicine

## 2018-03-15 NOTE — Telephone Encounter (Signed)
Pt's Dtr, [name], dropped off [place] [employer] handicap placard renewal form for completion.  Pls call Jocelyn Lamer to come pick up completed form at [phone].  Form put in Brittany's box.

## 2018-03-16 NOTE — Telephone Encounter (Signed)
Form has been completed and placed in providers box to review and sign.

## 2018-03-17 NOTE — Telephone Encounter (Signed)
Form has been signed, copy sent to scan.   Ricardo Perkins has been informed and will pick up original.

## 2018-03-18 DIAGNOSIS — J449 Chronic obstructive pulmonary disease, unspecified: Secondary | ICD-10-CM | POA: Diagnosis not present

## 2018-03-18 DIAGNOSIS — R630 Anorexia: Secondary | ICD-10-CM | POA: Diagnosis not present

## 2018-03-18 DIAGNOSIS — N184 Chronic kidney disease, stage 4 (severe): Secondary | ICD-10-CM | POA: Diagnosis not present

## 2018-03-18 DIAGNOSIS — C3490 Malignant neoplasm of unspecified part of unspecified bronchus or lung: Secondary | ICD-10-CM | POA: Diagnosis not present

## 2018-03-18 DIAGNOSIS — I4891 Unspecified atrial fibrillation: Secondary | ICD-10-CM | POA: Diagnosis not present

## 2018-03-18 DIAGNOSIS — I10 Essential (primary) hypertension: Secondary | ICD-10-CM | POA: Diagnosis not present

## 2018-03-20 DIAGNOSIS — I10 Essential (primary) hypertension: Secondary | ICD-10-CM | POA: Diagnosis not present

## 2018-03-20 DIAGNOSIS — I4891 Unspecified atrial fibrillation: Secondary | ICD-10-CM | POA: Diagnosis not present

## 2018-03-20 DIAGNOSIS — J449 Chronic obstructive pulmonary disease, unspecified: Secondary | ICD-10-CM | POA: Diagnosis not present

## 2018-03-20 DIAGNOSIS — N401 Enlarged prostate with lower urinary tract symptoms: Secondary | ICD-10-CM | POA: Diagnosis not present

## 2018-03-20 DIAGNOSIS — N184 Chronic kidney disease, stage 4 (severe): Secondary | ICD-10-CM | POA: Diagnosis not present

## 2018-03-20 DIAGNOSIS — C3490 Malignant neoplasm of unspecified part of unspecified bronchus or lung: Secondary | ICD-10-CM | POA: Diagnosis not present

## 2018-03-20 DIAGNOSIS — R630 Anorexia: Secondary | ICD-10-CM | POA: Diagnosis not present

## 2018-03-20 DIAGNOSIS — R609 Edema, unspecified: Secondary | ICD-10-CM | POA: Diagnosis not present

## 2018-03-22 DIAGNOSIS — J449 Chronic obstructive pulmonary disease, unspecified: Secondary | ICD-10-CM | POA: Diagnosis not present

## 2018-03-22 DIAGNOSIS — R630 Anorexia: Secondary | ICD-10-CM | POA: Diagnosis not present

## 2018-03-22 DIAGNOSIS — C3490 Malignant neoplasm of unspecified part of unspecified bronchus or lung: Secondary | ICD-10-CM | POA: Diagnosis not present

## 2018-03-22 DIAGNOSIS — I4891 Unspecified atrial fibrillation: Secondary | ICD-10-CM | POA: Diagnosis not present

## 2018-03-22 DIAGNOSIS — I10 Essential (primary) hypertension: Secondary | ICD-10-CM | POA: Diagnosis not present

## 2018-03-22 DIAGNOSIS — N184 Chronic kidney disease, stage 4 (severe): Secondary | ICD-10-CM | POA: Diagnosis not present

## 2018-03-23 DIAGNOSIS — I4891 Unspecified atrial fibrillation: Secondary | ICD-10-CM | POA: Diagnosis not present

## 2018-03-23 DIAGNOSIS — R630 Anorexia: Secondary | ICD-10-CM | POA: Diagnosis not present

## 2018-03-23 DIAGNOSIS — I10 Essential (primary) hypertension: Secondary | ICD-10-CM | POA: Diagnosis not present

## 2018-03-23 DIAGNOSIS — C3490 Malignant neoplasm of unspecified part of unspecified bronchus or lung: Secondary | ICD-10-CM | POA: Diagnosis not present

## 2018-03-23 DIAGNOSIS — N184 Chronic kidney disease, stage 4 (severe): Secondary | ICD-10-CM | POA: Diagnosis not present

## 2018-03-23 DIAGNOSIS — J449 Chronic obstructive pulmonary disease, unspecified: Secondary | ICD-10-CM | POA: Diagnosis not present

## 2018-03-25 NOTE — Telephone Encounter (Signed)
Form has been mailed as requested.

## 2018-03-28 DIAGNOSIS — N184 Chronic kidney disease, stage 4 (severe): Secondary | ICD-10-CM | POA: Diagnosis not present

## 2018-03-28 DIAGNOSIS — R630 Anorexia: Secondary | ICD-10-CM | POA: Diagnosis not present

## 2018-03-28 DIAGNOSIS — I4891 Unspecified atrial fibrillation: Secondary | ICD-10-CM | POA: Diagnosis not present

## 2018-03-28 DIAGNOSIS — I10 Essential (primary) hypertension: Secondary | ICD-10-CM | POA: Diagnosis not present

## 2018-03-28 DIAGNOSIS — J449 Chronic obstructive pulmonary disease, unspecified: Secondary | ICD-10-CM | POA: Diagnosis not present

## 2018-03-28 DIAGNOSIS — C3490 Malignant neoplasm of unspecified part of unspecified bronchus or lung: Secondary | ICD-10-CM | POA: Diagnosis not present

## 2018-03-30 DIAGNOSIS — I4891 Unspecified atrial fibrillation: Secondary | ICD-10-CM | POA: Diagnosis not present

## 2018-03-30 DIAGNOSIS — J449 Chronic obstructive pulmonary disease, unspecified: Secondary | ICD-10-CM | POA: Diagnosis not present

## 2018-03-30 DIAGNOSIS — I10 Essential (primary) hypertension: Secondary | ICD-10-CM | POA: Diagnosis not present

## 2018-03-30 DIAGNOSIS — N184 Chronic kidney disease, stage 4 (severe): Secondary | ICD-10-CM | POA: Diagnosis not present

## 2018-03-30 DIAGNOSIS — C3490 Malignant neoplasm of unspecified part of unspecified bronchus or lung: Secondary | ICD-10-CM | POA: Diagnosis not present

## 2018-03-30 DIAGNOSIS — R630 Anorexia: Secondary | ICD-10-CM | POA: Diagnosis not present

## 2018-03-31 DIAGNOSIS — C3490 Malignant neoplasm of unspecified part of unspecified bronchus or lung: Secondary | ICD-10-CM | POA: Diagnosis not present

## 2018-03-31 DIAGNOSIS — N184 Chronic kidney disease, stage 4 (severe): Secondary | ICD-10-CM | POA: Diagnosis not present

## 2018-03-31 DIAGNOSIS — R630 Anorexia: Secondary | ICD-10-CM | POA: Diagnosis not present

## 2018-03-31 DIAGNOSIS — I10 Essential (primary) hypertension: Secondary | ICD-10-CM | POA: Diagnosis not present

## 2018-03-31 DIAGNOSIS — J449 Chronic obstructive pulmonary disease, unspecified: Secondary | ICD-10-CM | POA: Diagnosis not present

## 2018-03-31 DIAGNOSIS — I4891 Unspecified atrial fibrillation: Secondary | ICD-10-CM | POA: Diagnosis not present

## 2018-04-04 DIAGNOSIS — I10 Essential (primary) hypertension: Secondary | ICD-10-CM | POA: Diagnosis not present

## 2018-04-04 DIAGNOSIS — J449 Chronic obstructive pulmonary disease, unspecified: Secondary | ICD-10-CM | POA: Diagnosis not present

## 2018-04-04 DIAGNOSIS — I4891 Unspecified atrial fibrillation: Secondary | ICD-10-CM | POA: Diagnosis not present

## 2018-04-04 DIAGNOSIS — C3490 Malignant neoplasm of unspecified part of unspecified bronchus or lung: Secondary | ICD-10-CM | POA: Diagnosis not present

## 2018-04-04 DIAGNOSIS — R630 Anorexia: Secondary | ICD-10-CM | POA: Diagnosis not present

## 2018-04-04 DIAGNOSIS — N184 Chronic kidney disease, stage 4 (severe): Secondary | ICD-10-CM | POA: Diagnosis not present

## 2018-04-07 DIAGNOSIS — J449 Chronic obstructive pulmonary disease, unspecified: Secondary | ICD-10-CM | POA: Diagnosis not present

## 2018-04-07 DIAGNOSIS — R630 Anorexia: Secondary | ICD-10-CM | POA: Diagnosis not present

## 2018-04-07 DIAGNOSIS — I4891 Unspecified atrial fibrillation: Secondary | ICD-10-CM | POA: Diagnosis not present

## 2018-04-07 DIAGNOSIS — N184 Chronic kidney disease, stage 4 (severe): Secondary | ICD-10-CM | POA: Diagnosis not present

## 2018-04-07 DIAGNOSIS — C3490 Malignant neoplasm of unspecified part of unspecified bronchus or lung: Secondary | ICD-10-CM | POA: Diagnosis not present

## 2018-04-07 DIAGNOSIS — I10 Essential (primary) hypertension: Secondary | ICD-10-CM | POA: Diagnosis not present

## 2018-04-08 ENCOUNTER — Telehealth: Payer: Self-pay | Admitting: Internal Medicine

## 2018-04-08 NOTE — Telephone Encounter (Signed)
Copied from [place] [phone]. Topic: Inquiry >> Apr 08, 2018 10:59 AM Scherrie Gerlach wrote: Reason for CRM: Shavon with [facility] states they faxed order to be signed on 8/19 and 9/10 and have not gotten back. They need asap. If you did not receive, please call.

## 2018-04-08 NOTE — Telephone Encounter (Signed)
Printed from [employer] and re-faxed

## 2018-04-11 DIAGNOSIS — J449 Chronic obstructive pulmonary disease, unspecified: Secondary | ICD-10-CM | POA: Diagnosis not present

## 2018-04-11 DIAGNOSIS — R630 Anorexia: Secondary | ICD-10-CM | POA: Diagnosis not present

## 2018-04-11 DIAGNOSIS — N184 Chronic kidney disease, stage 4 (severe): Secondary | ICD-10-CM | POA: Diagnosis not present

## 2018-04-11 DIAGNOSIS — C3490 Malignant neoplasm of unspecified part of unspecified bronchus or lung: Secondary | ICD-10-CM | POA: Diagnosis not present

## 2018-04-11 DIAGNOSIS — I10 Essential (primary) hypertension: Secondary | ICD-10-CM | POA: Diagnosis not present

## 2018-04-11 DIAGNOSIS — I4891 Unspecified atrial fibrillation: Secondary | ICD-10-CM | POA: Diagnosis not present

## 2018-04-18 DIAGNOSIS — I10 Essential (primary) hypertension: Secondary | ICD-10-CM | POA: Diagnosis not present

## 2018-04-18 DIAGNOSIS — R630 Anorexia: Secondary | ICD-10-CM | POA: Diagnosis not present

## 2018-04-18 DIAGNOSIS — N184 Chronic kidney disease, stage 4 (severe): Secondary | ICD-10-CM | POA: Diagnosis not present

## 2018-04-18 DIAGNOSIS — C3490 Malignant neoplasm of unspecified part of unspecified bronchus or lung: Secondary | ICD-10-CM | POA: Diagnosis not present

## 2018-04-18 DIAGNOSIS — I4891 Unspecified atrial fibrillation: Secondary | ICD-10-CM | POA: Diagnosis not present

## 2018-04-18 DIAGNOSIS — J449 Chronic obstructive pulmonary disease, unspecified: Secondary | ICD-10-CM | POA: Diagnosis not present

## 2018-04-19 DIAGNOSIS — C3490 Malignant neoplasm of unspecified part of unspecified bronchus or lung: Secondary | ICD-10-CM | POA: Diagnosis not present

## 2018-04-19 DIAGNOSIS — R609 Edema, unspecified: Secondary | ICD-10-CM | POA: Diagnosis not present

## 2018-04-19 DIAGNOSIS — I4891 Unspecified atrial fibrillation: Secondary | ICD-10-CM | POA: Diagnosis not present

## 2018-04-19 DIAGNOSIS — I10 Essential (primary) hypertension: Secondary | ICD-10-CM | POA: Diagnosis not present

## 2018-04-19 DIAGNOSIS — N401 Enlarged prostate with lower urinary tract symptoms: Secondary | ICD-10-CM | POA: Diagnosis not present

## 2018-04-19 DIAGNOSIS — N184 Chronic kidney disease, stage 4 (severe): Secondary | ICD-10-CM | POA: Diagnosis not present

## 2018-04-19 DIAGNOSIS — R630 Anorexia: Secondary | ICD-10-CM | POA: Diagnosis not present

## 2018-04-19 DIAGNOSIS — J449 Chronic obstructive pulmonary disease, unspecified: Secondary | ICD-10-CM | POA: Diagnosis not present

## 2018-05-13 ENCOUNTER — Emergency Department (HOSPITAL_COMMUNITY)
Admission: EM | Admit: 2018-05-13 | Discharge: 2018-05-13 | Disposition: A | Discharge status: Hospice | Attending: Emergency Medicine | Admitting: Emergency Medicine

## 2018-05-13 ENCOUNTER — Encounter (HOSPITAL_COMMUNITY): Payer: Self-pay | Admitting: Emergency Medicine

## 2018-05-13 DIAGNOSIS — R42 Dizziness and giddiness: Secondary | ICD-10-CM | POA: Diagnosis not present

## 2018-05-13 DIAGNOSIS — Y999 Unspecified external cause status: Secondary | ICD-10-CM | POA: Diagnosis not present

## 2018-05-13 DIAGNOSIS — J9601 Acute respiratory failure with hypoxia: Secondary | ICD-10-CM

## 2018-05-13 DIAGNOSIS — R52 Pain, unspecified: Secondary | ICD-10-CM | POA: Diagnosis not present

## 2018-05-13 DIAGNOSIS — M545 Low back pain: Secondary | ICD-10-CM | POA: Insufficient documentation

## 2018-05-13 DIAGNOSIS — Z85118 Personal history of other malignant neoplasm of bronchus and lung: Secondary | ICD-10-CM | POA: Insufficient documentation

## 2018-05-13 DIAGNOSIS — Z96651 Presence of right artificial knee joint: Secondary | ICD-10-CM | POA: Insufficient documentation

## 2018-05-13 DIAGNOSIS — J449 Chronic obstructive pulmonary disease, unspecified: Secondary | ICD-10-CM | POA: Insufficient documentation

## 2018-05-13 DIAGNOSIS — Z79899 Other long term (current) drug therapy: Secondary | ICD-10-CM | POA: Insufficient documentation

## 2018-05-13 DIAGNOSIS — W19XXXA Unspecified fall, initial encounter: Secondary | ICD-10-CM | POA: Diagnosis not present

## 2018-05-13 DIAGNOSIS — Z87891 Personal history of nicotine dependence: Secondary | ICD-10-CM | POA: Insufficient documentation

## 2018-05-13 DIAGNOSIS — Y929 Unspecified place or not applicable: Secondary | ICD-10-CM | POA: Diagnosis not present

## 2018-05-13 DIAGNOSIS — Y939 Activity, unspecified: Secondary | ICD-10-CM | POA: Diagnosis not present

## 2018-05-13 DIAGNOSIS — I129 Hypertensive chronic kidney disease with stage 1 through stage 4 chronic kidney disease, or unspecified chronic kidney disease: Secondary | ICD-10-CM | POA: Diagnosis not present

## 2018-05-13 DIAGNOSIS — R0902 Hypoxemia: Secondary | ICD-10-CM | POA: Diagnosis present

## 2018-05-13 DIAGNOSIS — N183 Chronic kidney disease, stage 3 (moderate): Secondary | ICD-10-CM | POA: Insufficient documentation

## 2018-05-13 MED ORDER — FENTANYL CITRATE (PF) 100 MCG/2ML IJ SOLN
50.0000 ug | Freq: Once | INTRAMUSCULAR | Status: AC
  Administered 2018-05-13: 50 ug via INTRAVENOUS
  Filled 2018-05-13: qty 2

## 2018-05-13 MED ORDER — OXYCODONE HCL 5 MG PO TABS
10.0000 mg | ORAL_TABLET | Freq: Once | ORAL | Status: AC
  Administered 2018-05-13: 10 mg via ORAL
  Filled 2018-05-13: qty 2

## 2018-05-13 NOTE — ED Notes (Signed)
Spoke with Linus Orn from [facility] and setup transport for pt to [facility].

## 2018-05-13 NOTE — ED Notes (Signed)
Report give to Clarene Critchley at [facility].

## 2018-05-13 NOTE — ED Notes (Signed)
Bed: WA08 Expected date:  Expected time:  Means of arrival:  Comments: 84 yo fall

## 2018-05-13 NOTE — ED Notes (Signed)
Pt stated that he would like to pass in peace. He does not want to be in pain. Pt family member at bedside also stated that she is not worried about blood work. The only thing she wants is pain control and comfort for her father. Condom cath has been placed on pt and was explained to pt and family member.

## 2018-05-13 NOTE — ED Provider Notes (Signed)
[facility] DEPT Provider Note   CSN: [id number] Arrival date & time: 05/13/18  1132     History   Chief Complaint Chief Complaint  Patient presents with  . Fall  . Hypoxia    HPI Ricardo Perkins is a 84 y.o. male.  The history is provided by the patient.  Fall  This is a new problem. The current episode started less than 1 hour ago. The problem occurs constantly. The problem has not changed since onset.Pertinent negatives include no chest pain, no abdominal pain, no headaches and no shortness of breath. Associated symptoms comments: Back pain . The symptoms are aggravated by twisting. Nothing relieves the symptoms. He has tried nothing for the symptoms. The treatment provided no relief.    Past Medical History:  Diagnosis Date  . Advanced age   . Arthritis    knees, shoulders   . ASTEATOTIC ECZEMA   . BENIGN PROSTATIC HYPERTROPHY, WITH OBSTRUCTION   . BRADYCARDIA    1st degree AVB  . COPD (chronic obstructive pulmonary disease) (HCC)    on chronic oxygen therapy qhs>daytime  . DECREASED HEARING   . Hx of cardiovascular stress test    a.  MV 4/09:  possible small area of lateral ischemia at the apex, but Dr. Kirk Ruths reviewed this, and in fact, felt it was most likely normal.  b.  Myoview 3/13 was again low risk with an EF of 62% with small reversible defect in the apex suggestive of very mild apical ischemia.  Marland Kitchen Hx of echocardiogram    a. echo in 2009 (in [place]): normal EF, mild LVH;   b. echo 10/13:  mid Ant HK, mod LVH, EF 55%, trivial AI, mod LAE, mild RVE, mild reduced RVSF, PASP 46  . HYPERTENSION   . INSOMNIA, PERSISTENT   . Large bilateral inguinal hernias - chronic, containing small bowel (right) and sigmoid colon (left) 01/15/2011   s/p B repair  . LEG EDEMA, BILATERAL   . OA (osteoarthritis) of knee    S/p right TKR March '13 (Dr. Wynelle Link)   . PAROXYSMAL ATRIAL FIBRILLATION    coumadin rx  . RENAL INSUFFICIENCY   .  RHINITIS, VASOMOTOR 01/07/2010    Patient Active Problem List   Diagnosis Date Noted  . Pain of right hip joint 10/14/2017  . Chronic respiratory failure ([place]) 07/30/2017  . Diarrhea 07/04/2017  . Generalized weakness 03/26/2017  . Recurrent falls 03/26/2017  . Lung cancer ([place]) 12/20/2015  . Congestive dilated cardiomyopathy ([place]) 11/04/2015  . CKD (chronic kidney disease) stage 3, GFR 30-59 ml/min (HCC) 10/03/2015  . BPH (benign prostatic hyperplasia) 10/03/2015  . Post herpetic neuralgia 09/01/2015  . Postinflammatory pulmonary fibrosis ([place]) 02/12/2015  . Raynaud phenomenon 06/21/2014  . Essential hypertension 07/23/2008    Past Surgical History:  Procedure Laterality Date  . APPENDECTOMY     @ 23  . EYE SURGERY     bilateral cataract surgery   . HERNIA REPAIR  01/2011   bilateral inguinal hernia   . KNEE SURGERY  right  . OTHER SURGICAL HISTORY     left finger surgery   . TONSILLECTOMY    . TOTAL KNEE ARTHROPLASTY  10/26/2011   Procedure: TOTAL KNEE ARTHROPLASTY;  Surgeon: Gearlean Alf, MD;  Location: [facility];  Service: Orthopedics;  Laterality: Right;        Home Medications    Prior to Admission medications   Medication Sig Start Date End Date Taking? Authorizing Provider  albuterol (PROVENTIL) (2.5 MG/3ML) 0.083% nebulizer solution Take 1 ampule by nebulization every 6 (six) hours as needed for wheezing.    [provider]  finasteride (PROSCAR) 5 MG tablet TAKE 1 TABLET(5 MG) BY MOUTH DAILY 05/07/17   [name], MD  lidocaine (LIDODERM) 5 % Place 1 patch onto the skin daily as needed (pain). Remove & Discard patch within 12 hours or as directed by MD 10/14/17   Rosemarie Ax, MD  lipase/protease/amylase (CREON) 36000 UNITS CPEP capsule Take 2 capsules (72,000 Units total) by mouth 3 (three) times daily before meals. 03/04/18   [name], MD  metoprolol tartrate (LOPRESSOR) 25 MG tablet Take 0.5 tablets (12.5 mg total) by mouth  2 (two) times daily. 05/07/17   [name], MD  potassium chloride SA (K-DUR,KLOR-CON) 20 MEQ tablet Take 1 tablet (20 mEq total) by mouth daily. 05/07/17   [name], MD  predniSONE (DELTASONE) 20 MG tablet Take 3 pills daily for 2 weeks, then 2 pills daily for 5 days, then 1 pill daily for 5 days then resume 10 mg daily. 03/04/18   [name], MD  tamsulosin (FLOMAX) 0.4 MG CAPS capsule Take 1 capsule (0.4 mg total) by mouth daily after supper. 05/07/17   [name], MD  temazepam (RESTORIL) 30 MG capsule Take 1 capsule (30 mg total) by mouth at bedtime as needed for sleep. 02/11/18   [name], MD  tiotropium (SPIRIVA) 18 MCG inhalation capsule Place 1 capsule (18 mcg total) into inhaler and inhale daily. Patient not taking: Reported on 03/04/2018 07/10/17   Oswald Hillock, MD  traMADol (ULTRAM) 50 MG tablet Take 1 tablet (50 mg total) by mouth every 12 (twelve) hours as needed. 03/04/18   [name], MD    Family History Family History  Problem Relation Age of Onset  . Heart disease Father   . Heart attack Father   . Cancer Mother        colon   . Cirrhosis Sister        liver failure  . Heart attack Brother   . Diabetes Neg Hx     Social History Social History   Tobacco Use  . Smoking status: Former Smoker    Packs/day: 1.00    Years: 30.00    Pack years: 30.00    Types: Cigarettes    Last attempt to quit: 07/20/1998    Years since quitting: 19.8  . Smokeless tobacco: Never Used  Substance Use Topics  . Alcohol use: Yes    Alcohol/week: 0.0 standard drinks    Comment: wine occasional   . Drug use: No     Allergies   Patient has no known allergies.   Review of Systems Review of Systems  Constitutional: Negative for chills and fever.  HENT: Negative for ear pain and sore throat.   Eyes: Negative for pain and visual disturbance.  Respiratory: Negative for cough and shortness of breath.     Cardiovascular: Negative for chest pain and palpitations.  Gastrointestinal: Negative for abdominal pain and vomiting.  Genitourinary: Negative for dysuria and hematuria.  Musculoskeletal: Positive for back pain. Negative for arthralgias.  Skin: Negative for color change and rash.  Neurological: Negative for seizures, syncope and headaches.  All other systems reviewed and are negative.    Physical Exam Updated Vital Signs  ED Triage Vitals [05/13/18 1147]  Enc Vitals Group     BP      Pulse  Resp      Temp      Temp src      SpO2 (!) 69 %     Weight      Height      Head Circumference      Peak Flow      Pain Score      Pain Loc      Pain Edu?      Excl. in [place]?      Physical Exam  Constitutional: He is oriented to person, place, and time. He appears well-developed and well-nourished.  HENT:  Head: Normocephalic and atraumatic.  Eyes: Pupils are equal, round, and reactive to light. Conjunctivae are normal.  Neck: Normal range of motion. Neck supple.  Cardiovascular: Normal rate, regular rhythm, normal heart sounds and intact distal pulses.  No murmur heard. Pulmonary/Chest: No respiratory distress.  Coarse breath sounds throughout  Abdominal: Soft. There is no tenderness.  Musculoskeletal: Normal range of motion. He exhibits tenderness (TTP in paraspinal lumbar muscles ). He exhibits no edema.  Neurological: He is alert and oriented to person, place, and time.  Skin: Skin is warm and dry.  Psychiatric: He has a normal mood and affect.  Nursing note and vitals reviewed.    ED Treatments / Results  Labs (all labs ordered are listed, but only abnormal results are displayed) Labs Reviewed - No data to display  EKG None  Radiology No results found.  Procedures Procedures (including critical care time)  Medications Ordered in ED Medications  fentaNYL (SUBLIMAZE) injection 50 mcg (has no administration in time range)  fentaNYL (SUBLIMAZE) injection 50  mcg (50 mcg Intravenous Given 05/13/18 1209)     Initial Impression / Assessment and Plan / ED Course  I have reviewed the triage vital signs and the nursing notes.  Pertinent labs & imaging results that were available during my care of the patient were reviewed by me and considered in my medical decision making (see chart for details).     Ricardo Perkins is a 84 year old male with history of lung cancer, COPD who presents to the ED following a fall.  Patient with mechanical fall earlier today.  Did not hit his head.  No loss consciousness.  Patient hypoxic upon arrival but did improve with 4 L of oxygen.  Which is baseline for the patient.  Patient was placed on nonrebreather for comfort.  He is a hospice patient and was transition into hospice care but had not yet gotten placement.  Lives at home with his daughter who states that she does not want any labs, interventions and patient is DNR.  Patient was given multiple doses of IV fentanyl for pain.  Social work was engaged.  Hospice and social work, worked together and patient was transferred to hospice center for further comfort care measures.  Hemodynamically stable throughout my care.  Discharged from ED in good condition.  Final Clinical Impressions(s) / ED Diagnoses   Final diagnoses:  Fall, initial encounter  Acute respiratory failure with hypoxia [facility])    ED Discharge Orders    None       Lennice Sites, DO 05/13/18 1257

## 2018-05-13 NOTE — ED Triage Notes (Signed)
Pt had fall about 45 mins ago. Fell to knees. Hypozic en route. On non-rebreather on arrival. RA sats 69%

## 2018-05-13 NOTE — Progress Notes (Addendum)
1:03pm-CSW received call from Wendee Copp with [facility] informing CSW that pt would be being admitted to [facility] today. RN updated as well as MD. At this time there are no further CSW needs. CSW signing off.    CSW acknowledges consult at this time. CSW spoke with MD who expressed that pt is getting Hospice Service at this time. CSW spoke with Precious Bard who is pt's in home hospice RN and was informed  that [facility] currently working with pt in the home and that pt has been active with them for 70 days. CSW also spoke with pt's daughter Jocelyn Lamer who expressed that Hospice liaison Audrea Muscat was in the  room with her and pt at this time. CSW asked that daughter have Audrea Muscat call CSW once finished as daughter expressed the desire to have pt placed into a hospice home as opposed to taking pt back home. CSW will continue to follow for further needs.    Virgie Dad Nivea Wojdyla, MSW, [facility] Clinical Social Worker [phone]

## 2018-05-13 NOTE — Progress Notes (Signed)
[facility]) hospital liaison note.  This is an active [facility] home care patient and the family is interested a transfer to [facility]. Chart reviewed and eligibility confirmed. Met with  family to confirm interest and explain services. Family agreeable to transfer today. Carolynne Edouard aware.  Registration paper work completed. Dr. Orpah Melter to assume care per family request.  Please fax discharge summary to [phone]. RN please call report to [phone].   Please use [facility] for transport.   Pleas call with any hospice related questions or concerns.  Thank you, Farrel Gordon, RN, [facility] Liaison  [place] are on [facility]

## 2018-05-16 ENCOUNTER — Telehealth: Payer: Self-pay | Admitting: Internal Medicine

## 2018-05-16 NOTE — Telephone Encounter (Signed)
05/16/18 Received [facility]/DC for Dr. Sharlet Salina to sign at [place] from [place].

## 2018-05-17 ENCOUNTER — Telehealth: Payer: Self-pay

## 2018-05-17 NOTE — Telephone Encounter (Signed)
On 05/17/18 I received the d/c back from Doctor Sharlet Salina. I got the d/c ready and called the funeral home to let them know the d/c is ready for pickup.  I also faxed a copy to the funeral home per the funeral home request.

## 2018-05-20 DEATH — deceased

## 2018-09-08 IMAGING — DX DG RIBS 2V*R*
4 series · 4 of 4 positions shown · non-contrast
Comparison: 07/04/2017

CLINICAL DATA: Rib tenderness on the right no injury

EXAM:
RIGHT RIBS - 2 VIEW

[rib ap (1 of 2)]
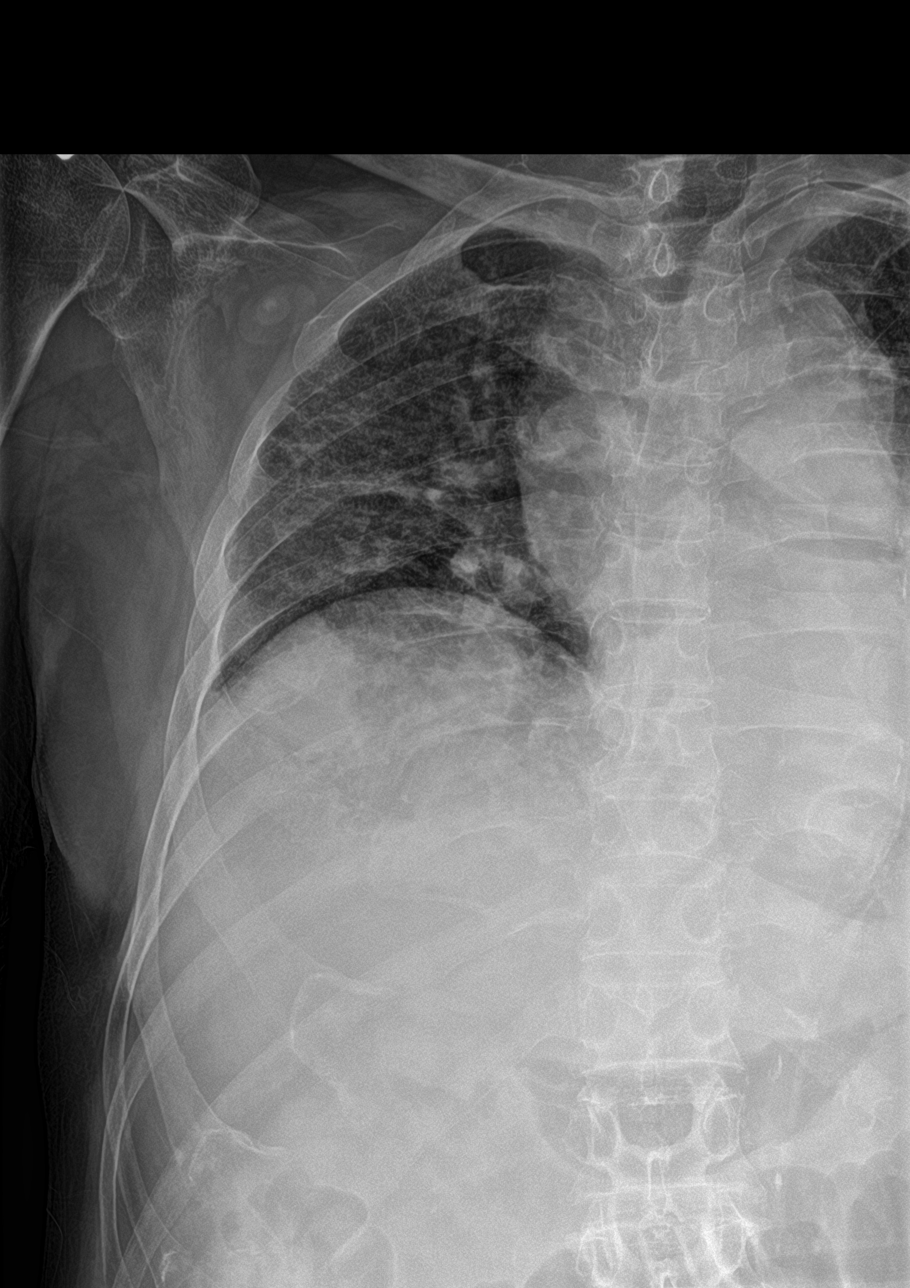

[rib ap obl (1 of 2)]
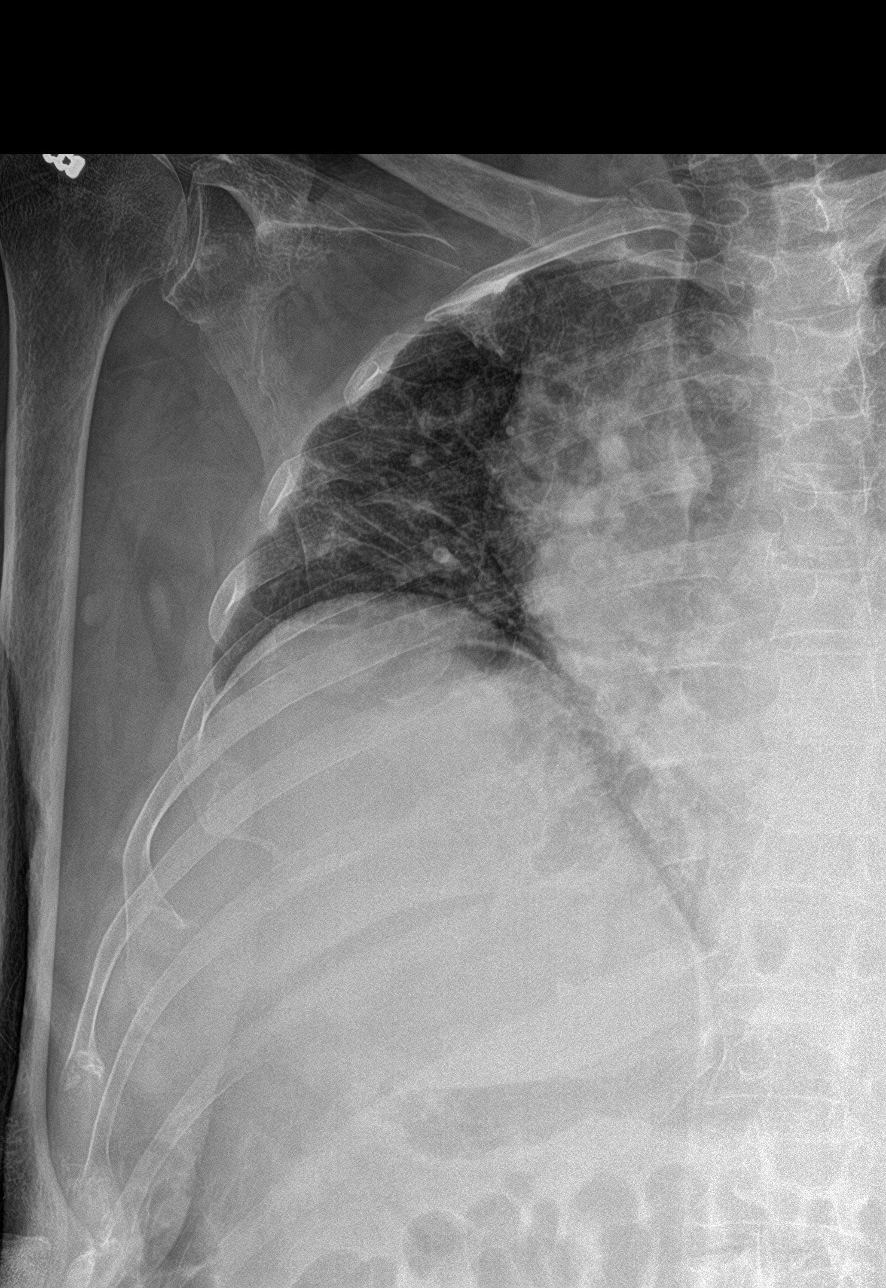

[rib ap (2 of 2)]
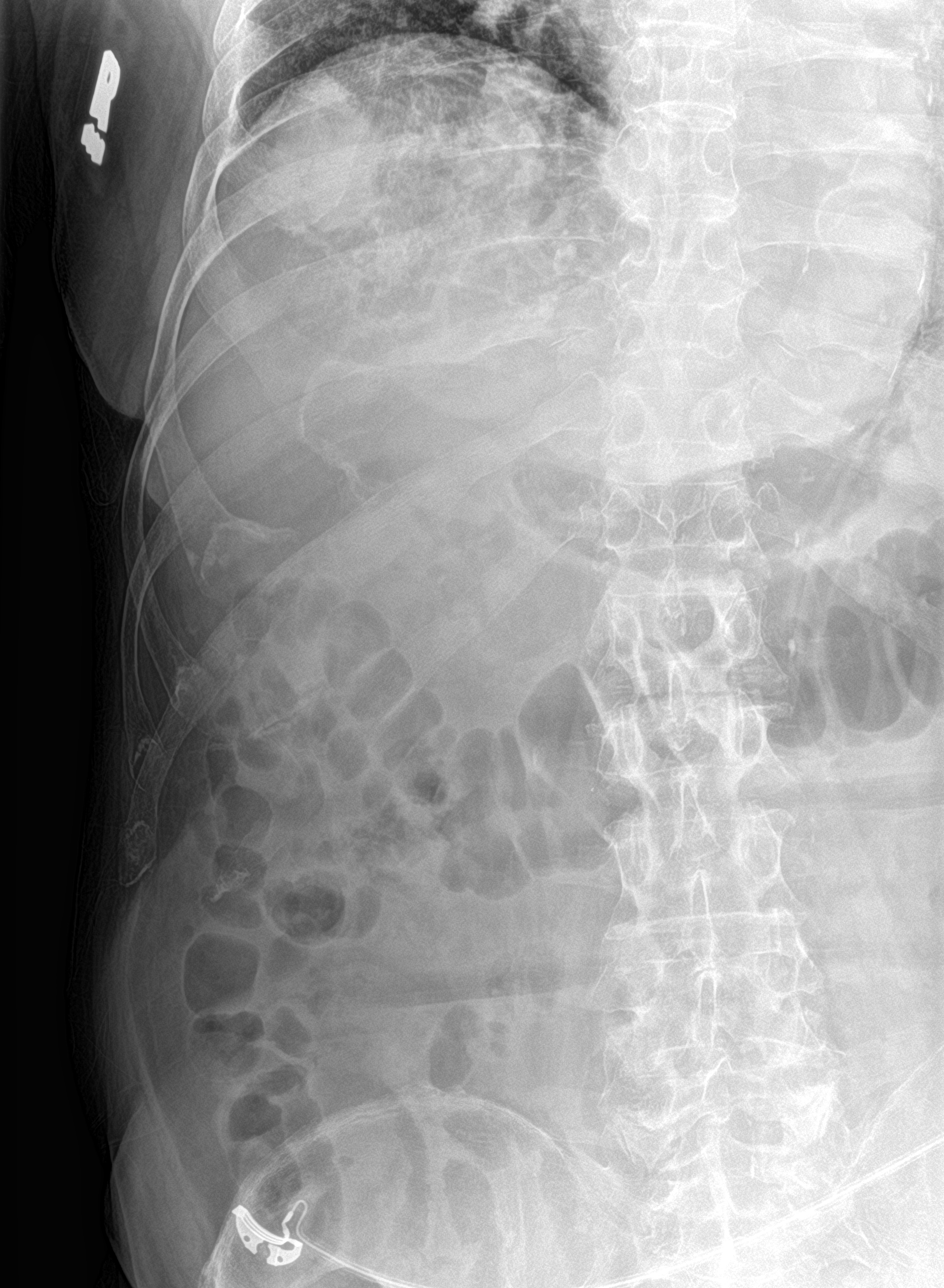

[rib ap obl (2 of 2)]
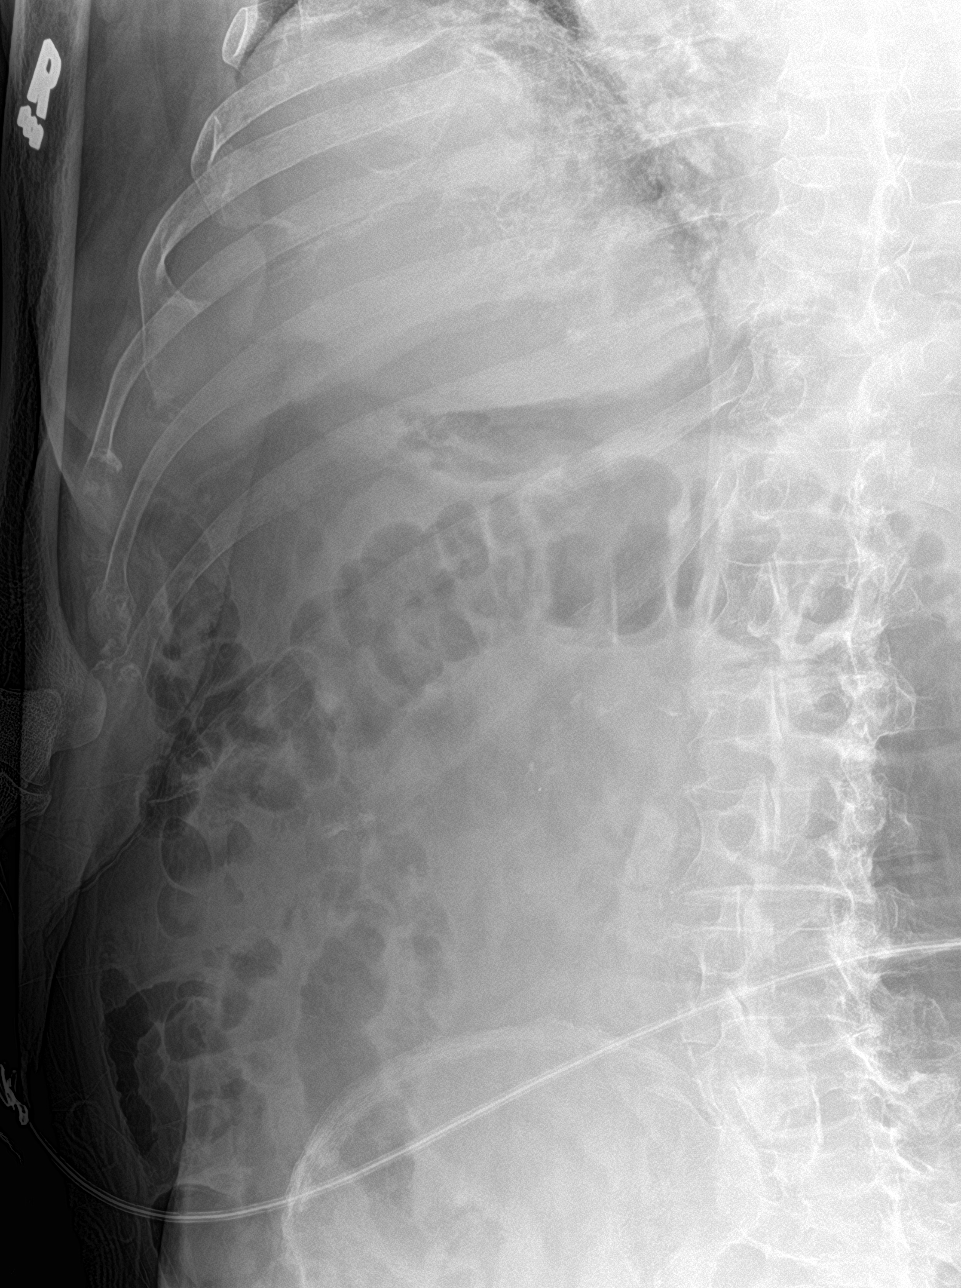

[4 of 4 positions shown; findings below may reference images not displayed]

FINDINGS: No fracture or other bone lesions are seen involving the ribs.
IMPRESSION: Negative.

## 2018-10-20 ENCOUNTER — Ambulatory Visit: Payer: Medicare Other | Admitting: Internal Medicine

## 2018-12-17 IMAGING — DX DG CHEST 2V
2 series · 2 of 2 positions shown · non-contrast
Comparison: Chest x-ray of October 04, 2017

CLINICAL DATA: History of a fall 2 weeks ago. Chronic shortness of
breath. Recent episode of pneumonia. History of COPD, pulmonary
fibrosis, former smoker.

EXAM:
CHEST - 2 VIEW

[chest pa]
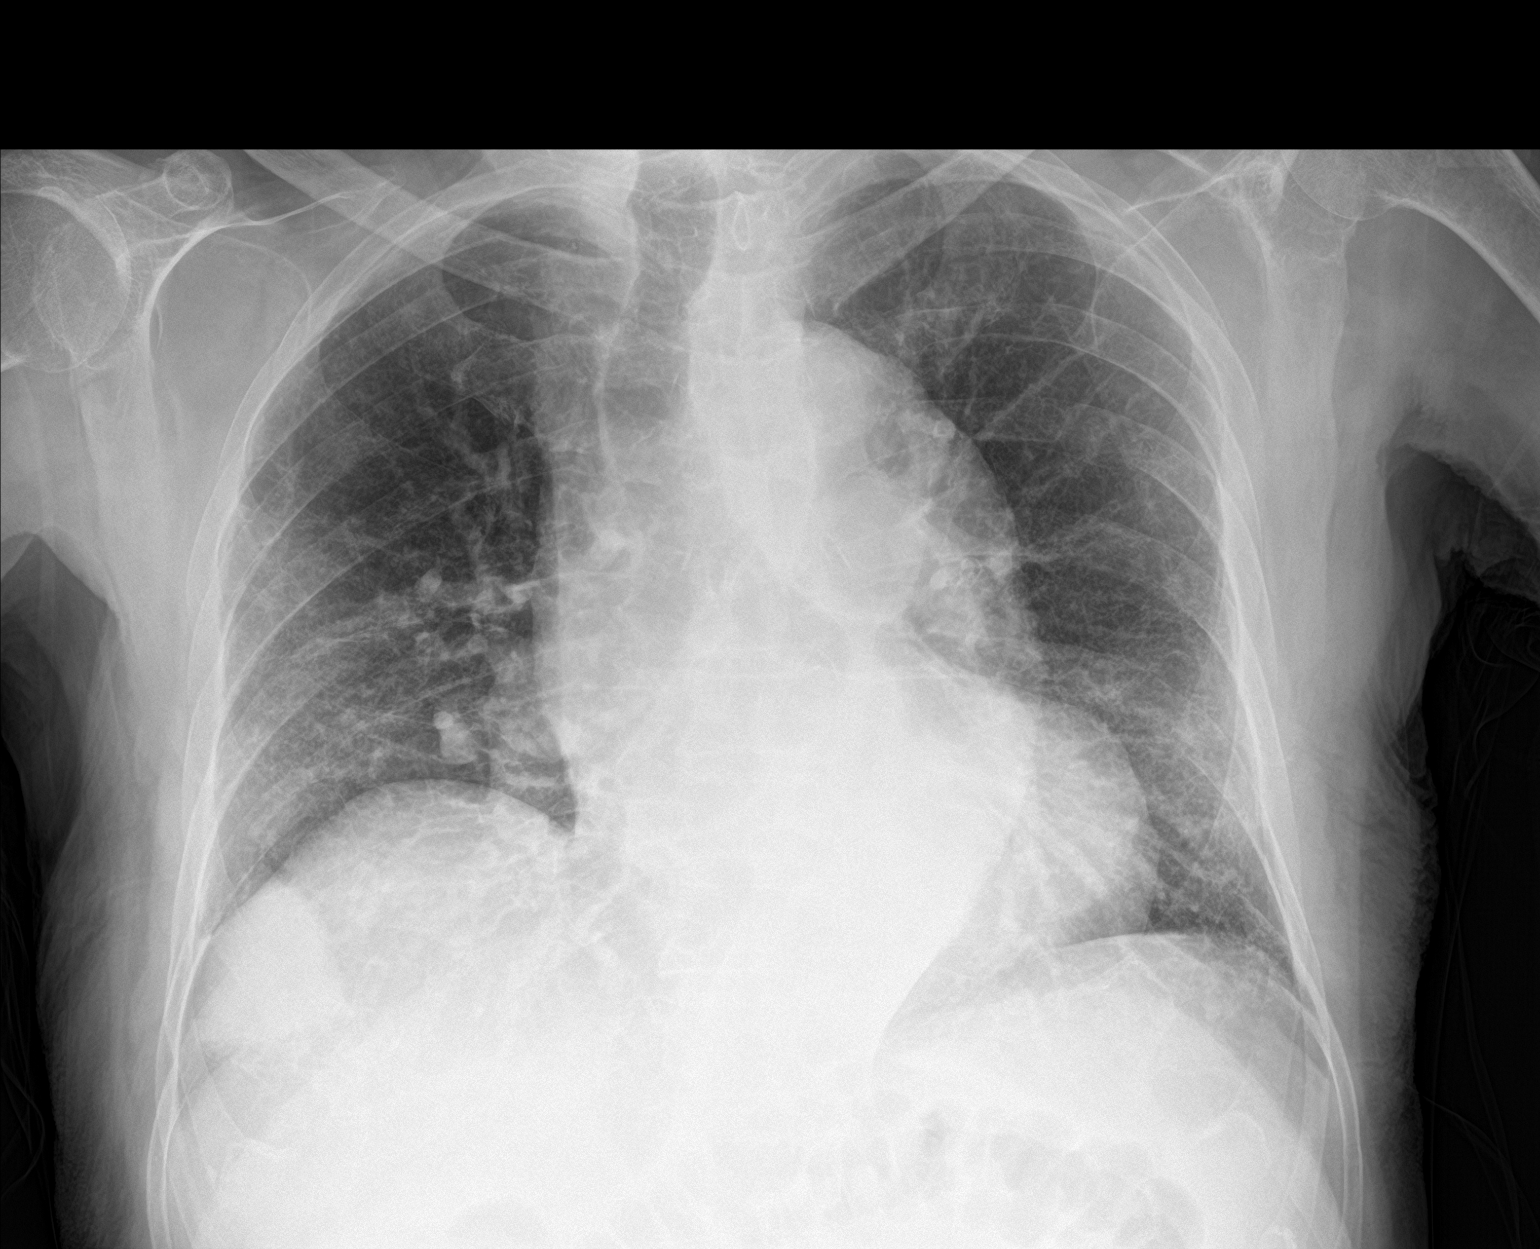

[chest lat]
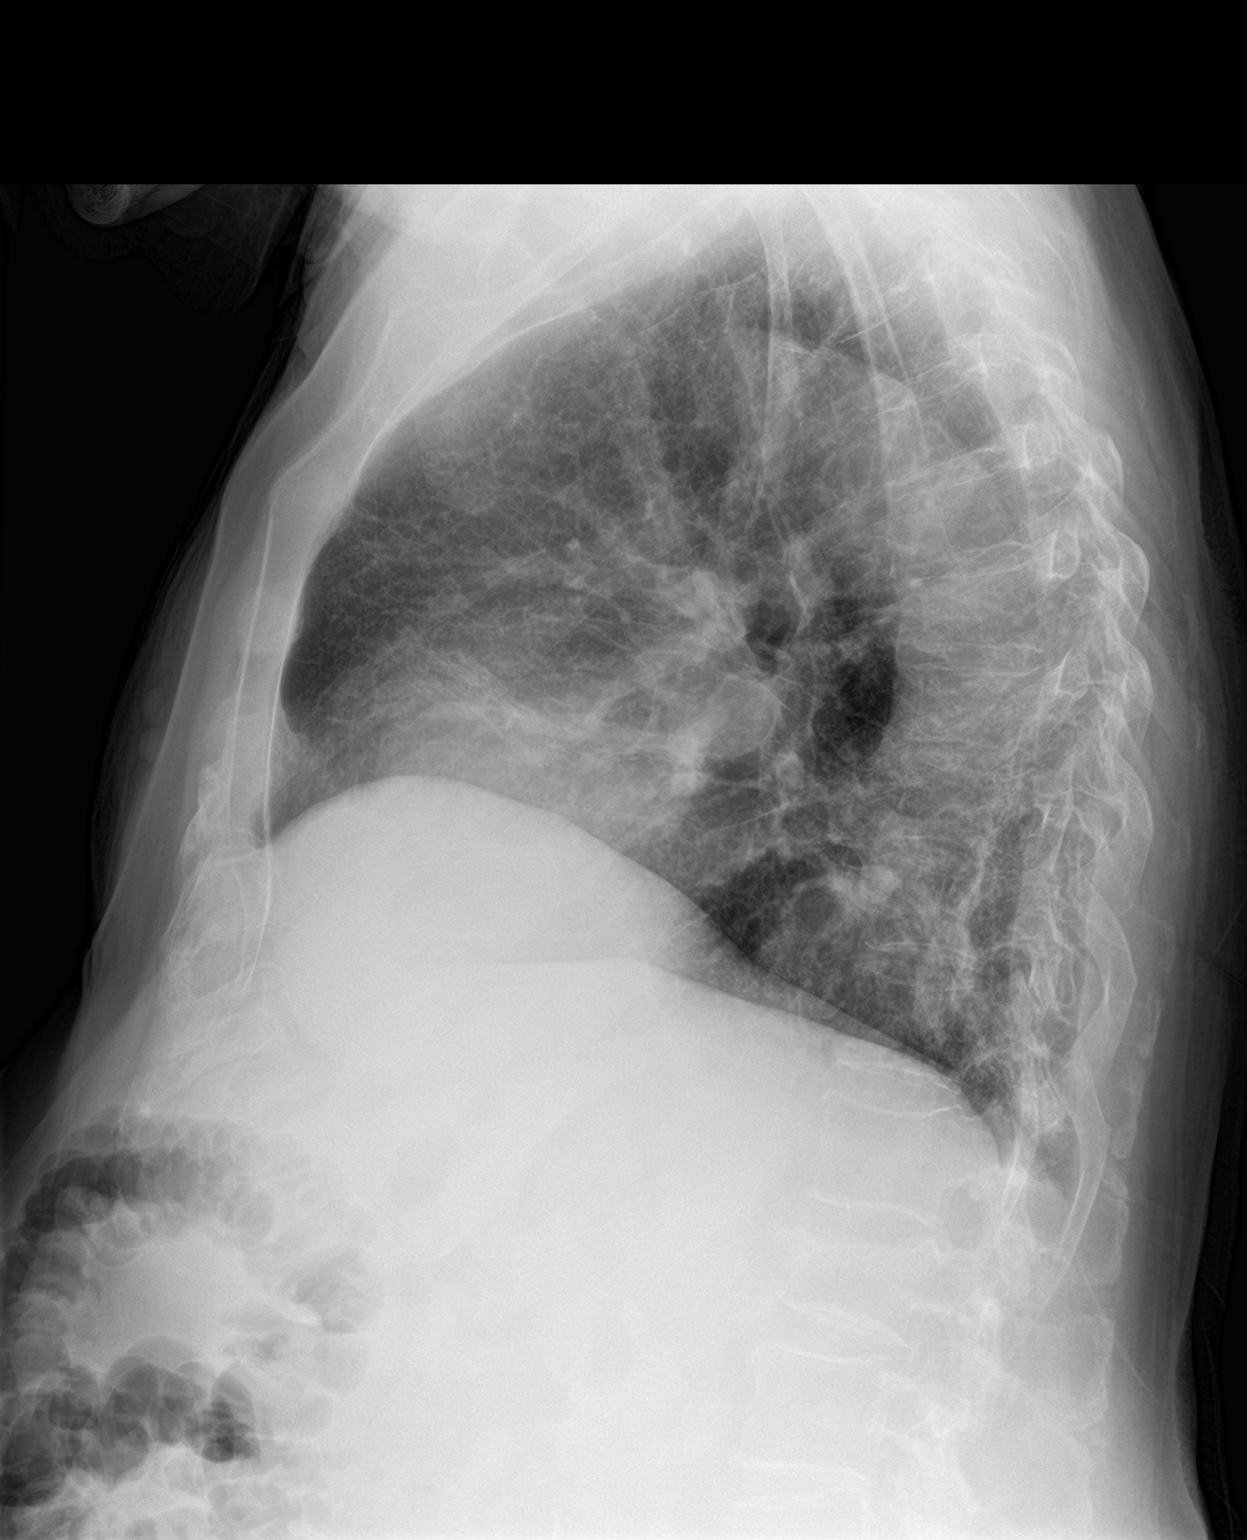

[2 of 2 positions shown; findings below may reference images not displayed]

FINDINGS: The right lung remains mildly hypoinflated. There is mild elevation
of the hemidiaphragm. There is patchy airspace opacity in the right
lower lung both anteriorly and posteriorly. A well-circumscribed
area of increased density projecting over the right lateral
costophrenic gutter is more conspicuous today. The left lung is
adequately inflated. The interstitial markings are mildly prominent.
The heart is top-normal in size. There is tortuosity of the
ascending and descending thoracic aorta with mural calcification.
There is no pulmonary vascular congestion.
IMPRESSION: Persistent right basilar pneumonia. Underlying chronic
bronchitic-pulmonary fibrotic changes. No overt CHF.

Thoracic aortic atherosclerosis.
